# Patient Record
Sex: Female | Born: 1935 | Race: White | Hispanic: No | Marital: Married | Smoking: Never smoker
Health system: Southern US, Community
[De-identification: ages and names within clinical notes are randomized; demographics above are authoritative.]

## PROBLEM LIST (undated history)

## (undated) DIAGNOSIS — E119 Type 2 diabetes mellitus without complications: Secondary | ICD-10-CM

## (undated) DIAGNOSIS — E7800 Pure hypercholesterolemia, unspecified: Secondary | ICD-10-CM

## (undated) DIAGNOSIS — J4489 Other specified chronic obstructive pulmonary disease: Secondary | ICD-10-CM

## (undated) DIAGNOSIS — I10 Essential (primary) hypertension: Secondary | ICD-10-CM

## (undated) DIAGNOSIS — C801 Malignant (primary) neoplasm, unspecified: Secondary | ICD-10-CM

## (undated) DIAGNOSIS — M549 Dorsalgia, unspecified: Secondary | ICD-10-CM

## (undated) DIAGNOSIS — M1990 Unspecified osteoarthritis, unspecified site: Secondary | ICD-10-CM

## (undated) DIAGNOSIS — J209 Acute bronchitis, unspecified: Secondary | ICD-10-CM

## (undated) DIAGNOSIS — I2510 Atherosclerotic heart disease of native coronary artery without angina pectoris: Secondary | ICD-10-CM

## (undated) DIAGNOSIS — G8929 Other chronic pain: Secondary | ICD-10-CM

## (undated) DIAGNOSIS — J309 Allergic rhinitis, unspecified: Secondary | ICD-10-CM

## (undated) DIAGNOSIS — J449 Chronic obstructive pulmonary disease, unspecified: Secondary | ICD-10-CM

## (undated) DIAGNOSIS — G40909 Epilepsy, unspecified, not intractable, without status epilepticus: Secondary | ICD-10-CM

## (undated) DIAGNOSIS — I639 Cerebral infarction, unspecified: Secondary | ICD-10-CM

## (undated) HISTORY — PX: LUMBAR SPINE SURGERY: SHX701

## (undated) HISTORY — PX: TOTAL HIP ARTHROPLASTY: SHX124

## (undated) HISTORY — DX: Cerebral infarction, unspecified: I63.9

## (undated) HISTORY — DX: Unspecified osteoarthritis, unspecified site: M19.90

## (undated) HISTORY — PX: FEMUR FRACTURE SURGERY: SHX633

## (undated) HISTORY — DX: Essential (primary) hypertension: I10

## (undated) HISTORY — DX: Acute bronchitis, unspecified: J20.9

## (undated) HISTORY — DX: Other chronic pain: G89.29

## (undated) HISTORY — DX: Allergic rhinitis, unspecified: J30.9

## (undated) HISTORY — DX: Pure hypercholesterolemia, unspecified: E78.00

## (undated) HISTORY — PX: TOTAL KNEE ARTHROPLASTY: SHX125

## (undated) HISTORY — DX: Atherosclerotic heart disease of native coronary artery without angina pectoris: I25.10

## (undated) HISTORY — DX: Type 2 diabetes mellitus without complications: E11.9

## (undated) HISTORY — DX: Dorsalgia, unspecified: M54.9

## (undated) HISTORY — DX: Chronic obstructive pulmonary disease, unspecified: J44.9

## (undated) HISTORY — PX: BREAST BIOPSY: SHX20

## (undated) HISTORY — DX: Other specified chronic obstructive pulmonary disease: J44.89

## (undated) HISTORY — PX: TOTAL ABDOMINAL HYSTERECTOMY: SHX209

## (undated) HISTORY — DX: Epilepsy, unspecified, not intractable, without status epilepticus: G40.909

---

## 2001-08-05 HISTORY — PX: CORONARY STENT PLACEMENT: SHX1402

## 2003-05-28 ENCOUNTER — Other Ambulatory Visit: Payer: Self-pay

## 2003-12-12 ENCOUNTER — Ambulatory Visit: Payer: Self-pay | Admitting: Internal Medicine

## 2004-02-18 ENCOUNTER — Ambulatory Visit: Payer: Self-pay | Admitting: Internal Medicine

## 2004-07-24 ENCOUNTER — Ambulatory Visit: Payer: Self-pay | Admitting: Internal Medicine

## 2004-08-11 ENCOUNTER — Inpatient Hospital Stay: Payer: Self-pay | Admitting: Cardiology

## 2004-08-11 ENCOUNTER — Other Ambulatory Visit: Payer: Self-pay

## 2004-08-18 ENCOUNTER — Emergency Department: Payer: Self-pay | Admitting: Internal Medicine

## 2004-09-10 ENCOUNTER — Encounter: Admission: RE | Admit: 2004-09-10 | Discharge: 2004-09-10 | Payer: Self-pay | Admitting: Orthopedic Surgery

## 2004-10-07 ENCOUNTER — Ambulatory Visit: Payer: Self-pay | Admitting: Pulmonary Disease

## 2004-10-28 ENCOUNTER — Ambulatory Visit: Payer: Self-pay | Admitting: Internal Medicine

## 2005-01-13 ENCOUNTER — Ambulatory Visit: Payer: Self-pay | Admitting: Internal Medicine

## 2005-05-08 ENCOUNTER — Ambulatory Visit: Payer: Self-pay | Admitting: Internal Medicine

## 2005-05-20 ENCOUNTER — Ambulatory Visit (HOSPITAL_COMMUNITY): Admission: RE | Admit: 2005-05-20 | Discharge: 2005-05-20 | Payer: Self-pay | Admitting: Neurosurgery

## 2005-07-07 ENCOUNTER — Ambulatory Visit: Payer: Self-pay | Admitting: Internal Medicine

## 2005-07-21 ENCOUNTER — Inpatient Hospital Stay (HOSPITAL_COMMUNITY): Admission: RE | Admit: 2005-07-21 | Discharge: 2005-07-23 | Payer: Self-pay | Admitting: Neurosurgery

## 2005-08-13 ENCOUNTER — Ambulatory Visit: Payer: Self-pay | Admitting: Internal Medicine

## 2005-08-31 ENCOUNTER — Ambulatory Visit: Payer: Self-pay | Admitting: Ophthalmology

## 2005-09-08 ENCOUNTER — Ambulatory Visit: Payer: Self-pay | Admitting: Ophthalmology

## 2005-09-23 ENCOUNTER — Ambulatory Visit (HOSPITAL_COMMUNITY): Admission: RE | Admit: 2005-09-23 | Discharge: 2005-09-23 | Payer: Self-pay | Admitting: Neurosurgery

## 2005-11-04 ENCOUNTER — Ambulatory Visit: Payer: Self-pay | Admitting: Internal Medicine

## 2005-11-25 ENCOUNTER — Inpatient Hospital Stay (HOSPITAL_COMMUNITY): Admission: RE | Admit: 2005-11-25 | Discharge: 2005-11-29 | Payer: Self-pay | Admitting: Neurosurgery

## 2005-12-25 ENCOUNTER — Ambulatory Visit: Payer: Self-pay | Admitting: Internal Medicine

## 2006-05-04 ENCOUNTER — Ambulatory Visit: Payer: Self-pay | Admitting: Internal Medicine

## 2006-07-19 ENCOUNTER — Ambulatory Visit: Payer: Self-pay | Admitting: Pulmonary Disease

## 2006-09-15 ENCOUNTER — Ambulatory Visit: Payer: Self-pay | Admitting: Internal Medicine

## 2006-11-04 ENCOUNTER — Ambulatory Visit: Payer: Self-pay | Admitting: Internal Medicine

## 2006-11-09 ENCOUNTER — Ambulatory Visit: Payer: Self-pay | Admitting: Internal Medicine

## 2007-03-31 ENCOUNTER — Ambulatory Visit: Payer: Self-pay | Admitting: Neurosurgery

## 2007-04-19 ENCOUNTER — Ambulatory Visit: Payer: Self-pay | Admitting: Internal Medicine

## 2007-04-19 DIAGNOSIS — Z8673 Personal history of transient ischemic attack (TIA), and cerebral infarction without residual deficits: Secondary | ICD-10-CM

## 2007-04-19 DIAGNOSIS — J3089 Other allergic rhinitis: Secondary | ICD-10-CM

## 2007-04-19 DIAGNOSIS — J209 Acute bronchitis, unspecified: Secondary | ICD-10-CM

## 2007-04-19 DIAGNOSIS — J302 Other seasonal allergic rhinitis: Secondary | ICD-10-CM

## 2007-04-19 DIAGNOSIS — I2510 Atherosclerotic heart disease of native coronary artery without angina pectoris: Secondary | ICD-10-CM | POA: Insufficient documentation

## 2007-04-28 ENCOUNTER — Telehealth: Payer: Self-pay | Admitting: Internal Medicine

## 2007-05-04 ENCOUNTER — Ambulatory Visit: Payer: Self-pay | Admitting: Internal Medicine

## 2007-05-06 ENCOUNTER — Telehealth (INDEPENDENT_AMBULATORY_CARE_PROVIDER_SITE_OTHER): Payer: Self-pay | Admitting: *Deleted

## 2007-05-10 ENCOUNTER — Ambulatory Visit: Payer: Self-pay | Admitting: Internal Medicine

## 2007-05-14 ENCOUNTER — Encounter: Payer: Self-pay | Admitting: Internal Medicine

## 2007-05-23 ENCOUNTER — Ambulatory Visit: Payer: Self-pay | Admitting: Internal Medicine

## 2007-05-24 ENCOUNTER — Ambulatory Visit: Payer: Self-pay | Admitting: General Practice

## 2007-05-26 ENCOUNTER — Telehealth (INDEPENDENT_AMBULATORY_CARE_PROVIDER_SITE_OTHER): Payer: Self-pay | Admitting: *Deleted

## 2007-06-08 ENCOUNTER — Inpatient Hospital Stay: Payer: Self-pay | Admitting: General Practice

## 2007-07-26 ENCOUNTER — Telehealth (INDEPENDENT_AMBULATORY_CARE_PROVIDER_SITE_OTHER): Payer: Self-pay | Admitting: *Deleted

## 2007-09-13 ENCOUNTER — Ambulatory Visit: Payer: Self-pay | Admitting: Internal Medicine

## 2007-09-14 ENCOUNTER — Telehealth: Payer: Self-pay | Admitting: Internal Medicine

## 2007-09-16 ENCOUNTER — Ambulatory Visit: Payer: Self-pay | Admitting: Internal Medicine

## 2007-09-27 ENCOUNTER — Ambulatory Visit: Payer: Self-pay | Admitting: Internal Medicine

## 2007-10-28 ENCOUNTER — Telehealth (INDEPENDENT_AMBULATORY_CARE_PROVIDER_SITE_OTHER): Payer: Self-pay | Admitting: *Deleted

## 2008-01-19 ENCOUNTER — Ambulatory Visit: Payer: Self-pay | Admitting: Internal Medicine

## 2008-01-23 ENCOUNTER — Ambulatory Visit: Payer: Self-pay | Admitting: Internal Medicine

## 2008-01-30 ENCOUNTER — Telehealth (INDEPENDENT_AMBULATORY_CARE_PROVIDER_SITE_OTHER): Payer: Self-pay | Admitting: *Deleted

## 2008-05-02 ENCOUNTER — Ambulatory Visit: Payer: Self-pay | Admitting: Internal Medicine

## 2008-06-29 ENCOUNTER — Ambulatory Visit: Payer: Self-pay | Admitting: Internal Medicine

## 2008-06-29 ENCOUNTER — Encounter (INDEPENDENT_AMBULATORY_CARE_PROVIDER_SITE_OTHER): Payer: Self-pay | Admitting: *Deleted

## 2008-07-05 ENCOUNTER — Telehealth: Payer: Self-pay | Admitting: Internal Medicine

## 2008-08-13 ENCOUNTER — Ambulatory Visit: Payer: Self-pay | Admitting: Internal Medicine

## 2009-01-21 ENCOUNTER — Ambulatory Visit: Payer: Self-pay | Admitting: Internal Medicine

## 2009-02-04 ENCOUNTER — Ambulatory Visit: Payer: Self-pay | Admitting: Internal Medicine

## 2009-02-07 ENCOUNTER — Ambulatory Visit: Payer: Self-pay | Admitting: Internal Medicine

## 2009-02-11 ENCOUNTER — Telehealth: Payer: Self-pay | Admitting: Adult Health

## 2009-02-19 ENCOUNTER — Ambulatory Visit: Payer: Self-pay | Admitting: Ophthalmology

## 2009-02-25 ENCOUNTER — Ambulatory Visit: Payer: Self-pay | Admitting: Ophthalmology

## 2009-04-08 ENCOUNTER — Ambulatory Visit: Payer: Self-pay | Admitting: Internal Medicine

## 2009-04-08 DIAGNOSIS — J4540 Moderate persistent asthma, uncomplicated: Secondary | ICD-10-CM

## 2009-04-23 ENCOUNTER — Ambulatory Visit: Payer: Self-pay | Admitting: Internal Medicine

## 2009-05-03 ENCOUNTER — Telehealth (INDEPENDENT_AMBULATORY_CARE_PROVIDER_SITE_OTHER): Payer: Self-pay | Admitting: *Deleted

## 2009-06-20 ENCOUNTER — Ambulatory Visit: Payer: Self-pay | Admitting: Internal Medicine

## 2009-12-02 ENCOUNTER — Ambulatory Visit: Payer: Self-pay | Admitting: Internal Medicine

## 2009-12-12 ENCOUNTER — Telehealth: Payer: Self-pay | Admitting: Internal Medicine

## 2010-01-08 ENCOUNTER — Ambulatory Visit
Admission: RE | Admit: 2010-01-08 | Discharge: 2010-01-08 | Payer: Self-pay | Source: Home / Self Care | Attending: Internal Medicine | Admitting: Internal Medicine

## 2010-01-15 ENCOUNTER — Telehealth: Payer: Self-pay | Admitting: Internal Medicine

## 2010-01-22 ENCOUNTER — Ambulatory Visit: Payer: Self-pay | Admitting: Internal Medicine

## 2010-01-26 ENCOUNTER — Encounter: Payer: Self-pay | Admitting: Orthopedic Surgery

## 2010-02-04 NOTE — Progress Notes (Signed)
Summary: medication  Phone Note Call from Patient   Caller: Patient Call For: Brooke Baker Summary of Call: pt still wheezing and chest congestion would like another round of antibiotic  [facility] Initial call taken by: Rickard Patience,  February 11, 2009 9:04 AM  Follow-up for Phone Call        The patient states she is feeling much better but she is still wheezing and feels congested. She just finished the Augmentin and Prednisone but feels she needs a few more days on the Augmentin. Please advise.Michel Bickers [facility]  February 11, 2009 12:17 PM  NKDA  Additional Follow-up for Phone Call Additional follow up Details #1::        Please contact office for sooner follow up if symptoms do not improve or worsen  Augmentin 875mg  two times a day for 7 more day #14, no refills.  follow up as scheduled.  Additional Follow-up by: Rubye Oaks NP,  February 11, 2009 1:09 PM    Additional Follow-up for Phone Call Additional follow up Details #2::    rx sent. pt aware. Carron Curie CMA  February 11, 2009 2:17 PM   Prescriptions: AUGMENTIN 875-125 MG TABS (AMOXICILLIN-POT CLAVULANATE) 1 by mouth two times a day  #14 x 0   Entered by:   Carron Curie CMA   Authorized by:   Rubye Oaks NP   Signed by:   Carron Curie CMA on 02/11/2009   Method used:   Electronically to        [facility] (retail)       [address]       [place], [place]  [id number]       Ph: [phone]       Fax: [phone]   RxID:   [id number]

## 2010-02-04 NOTE — Assessment & Plan Note (Signed)
Summary: Brooke Baker   Primary Provider/Referring Provider:  Dale Mullen  CC:  follow up visit-breathing doing good; no complaints..  History of Present Illness:   01/23/08-  Allergic rhinitis, asthma Caught cold a week ago and now progressive asthma with nasal congestion, green from nose and chest. Denies fever, sore throat and GI upset. Took Mucinex. Caught from husband and son. Seasonal flu vax.  June 29, 2008--Presents for an acute office visit. Pt c/o sneezing, wheezing, and sob since yesterday.  PT states she has a productive cough at times with yellow to white mucus for 3 days. Denies chest pain, dyspnea, orthopnea, hemoptysis, fever, n/v/d, edema, headache.   February 04, 2009--Presents for an acute office visit. Complains of prod cough with yellow/green mucus x1week with and onset of dyspnea and wheezing last night. OTC not helping. Wheezing bad last night. Denies chest pain,  orthopnea, hemoptysis, fever, n/v/d, edema, headache.   April 08, 2009- Allergic rhinitis, asthma.....................................Marland Kitchenhusband here She is feeling well today, but needed over 2 weeks to get over a sustained bronchitis during the winter. She notes little routine wheeze or cough but occasionally needs her rescue inhaler. Continues Symbicort 80 and her allergy vaccine without problems.   Current Medications (verified): 1)  Adult Aspirin Low Strength 81 Mg  Tbdp (Aspirin) .... Once Daily 2)  Plavix 75 Mg  Tabs (Clopidogrel Bisulfate) .... Once Daily 3)  Cozaar 100 Mg  Tabs (Losartan Potassium) .... Once Daily 4)  Toprol Xl 50 Mg  Tb24 (Metoprolol Succinate) .... Once Daily 5)  Crestor 20 Mg  Tabs (Rosuvastatin Calcium) .... Once Daily 6)  Keppra 750 Mg  Tabs (Levetiracetam) .... Two Times A Day 7)  Dilantin 100 Mg  Caps (Phenytoin Sodium Extended) .... 4tabs At Bedtime 8)  Zoloft 50 Mg  Tabs (Sertraline Hcl) .... Once Daily 9)  Allergy Vaccine 1:10 Go (W-E) .... Once Weekly 10)  Epipen 0.3  Mg/0.40ml (1:1000)  Devi (Epinephrine Hcl (Anaphylaxis)) .... As Needed 11)  Amlodipine Besylate 10 Mg  Tabs (Amlodipine Besylate) .... Once Daily 12)  Hydrochlorothiazide 12.5 Mg  Tabs (Hydrochlorothiazide) .... Once Daily 13)  Ventolin Hfa 108 (90 Base) Mcg/act  Aers (Albuterol Sulfate) .... Inhale 2 Puffs Every 4 Hours As Needed 14)  Symbicort 80-4.5 Mcg/act Aero (Budesonide-Formoterol Fumarate) .... 2 Puffs and Rinse  Allergies (verified): No Known Drug Allergies  Past History:  Past Medical History: Last updated: 09/27/2007 ALLERGIC RHINITIS (ICD-477.9) ASTHMATIC BRONCHITIS, ACUTE (ICD-466.0) CEREBROVASCULAR ACCIDENT (ICD-434.91) CORONARY ARTERY DISEASE (ICD-414.00)  Past Surgical History: Last updated: 01/23/2008 Lumbar spi ne x 3 hysterectomy right hip replacement right knee replacement.  Family History: Last updated: 02/04/2009 allergies - brother, sister asthma - brother, sister rheumatism - sister, brother cancer - sister (Non-Hodgkins Lymphoma) DM - brother stroke -  mother  Social History: Last updated: 02/04/2009 Patient never smoked.  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    Vital Signs:  Patient profile:   76 year old female Height:      66 inches Weight:      253 pounds BMI:     40.98 O2 Sat:      97 % on Room air Pulse rate:   64 / minute BP sitting:  134 / 60  (left arm) Cuff size:   large  Vitals Entered By: Reynaldo Minium CMA (April 08, 2009 3:03 PM)  O2 Flow:  Room air  Physical Exam  Additional Exam:  General: A/Ox3; pleasant and cooperative, NAD, overweight SKIN: no rash, lesions NODES: no  lymphadenopathy HEENT: [place]/AT, EOM- WNL, Conjuctivae- clear,  TM-WNL, Nose- Marked nasal congestion, Throat- clear and wnl, mild hoarseness, dentures NECK: Supple w/ fair ROM, JVD- none, normal carotid impulses w/o bruits Thyroid- normal to palpation CHEST: no  wheezing heardtoday HEART: RRR, no m/g/r heard ABDOMEN: Soft and nl;  DDU:KGUR, nl pulses, no edema  NEURO: Grossly intact to observation      Impression & Recommendations:  Problem # 1:  ALLERGIC RHINITIS (ICD-477.9)  We discussed and renewed her Epipen. She can continue vacine.  Problem # 2:  ASTHMA (ICD-493.90) Mild chronic intermittent asthma. She uses meds appropriately.  Other Orders: Est. Patient Level III (42706)  Patient Instructions: 1)  Please schedule a follow-up appointment in 6 months. 2)  Refill script for epipen 3)  Continuing allergy vaccine 4)  Call sooner as needed Prescriptions: EPIPEN 0.3 MG/0.3ML (1:1000)  DEVI (EPINEPHRINE HCL (ANAPHYLAXIS)) as needed  #1 x 0   Entered and Authorized by:   Waymon Budge MD   Signed by:   Waymon Budge MD on 04/08/2009   Method used:   Print then Give to Patient   RxID:   [id number]

## 2010-02-04 NOTE — Assessment & Plan Note (Signed)
Summary: Acute NP office visit - asthma   Primary Provider/Referring Provider:  Dale Goshen  CC:  prod cough with yellow/green mucus x1week with and onset of dyspnea and wheezing last night.  History of Present Illness: Current Problems:  ALLERGIC RHINITIS (ICD-477.9) ASTHMATIC BRONCHITIS, ACUTE (ICD-466.0) CEREBROVASCULAR ACCIDENT (ICD-434.91) CORONARY ARTERY DISEASE (ICD-414.00)   01/23/08-  Allergic rhinitis, asthma Caught cold a week ago and now progressive asthma with nasal congestion, green from nose and chest. Denies fever, sore throat and GI upset. Took Mucinex. Caught from husband and son. Seasonal flu vax.  June 29, 2008--Presents for an acute office visit. Pt c/o sneezing, wheezing, and sob since yesterday.  PT states she has a productive cough at times with yellow to white mucus for 3 days. Denies chest pain, dyspnea, orthopnea, hemoptysis, fever, n/v/d, edema, headache.   February 04, 2009--Presents for an acute office visit. Complains of prod cough with yellow/green mucus x1week with and onset of dyspnea and wheezing last night. OTC not helping. Wheezing bad last night. Denies chest pain,  orthopnea, hemoptysis, fever, n/v/d, edema, headache.     Medications Prior to Update: 1)  Adult Aspirin Low Strength 81 Mg  Tbdp (Aspirin) .... Once Daily 2)  Plavix 75 Mg  Tabs (Clopidogrel Bisulfate) .... Once Daily 3)  Cozaar 100 Mg  Tabs (Losartan Potassium) .... Once Daily 4)  Toprol Xl 50 Mg  Tb24 (Metoprolol Succinate) .... Once Daily 5)  Crestor 20 Mg  Tabs (Rosuvastatin Calcium) .... Once Daily 6)  Keppra 750 Mg  Tabs (Levetiracetam) .... Two Times A Day 7)  Dilantin 100 Mg  Caps (Phenytoin Sodium Extended) .... 4tabs At Bedtime 8)  Zoloft 50 Mg  Tabs (Sertraline Hcl) .... Once Daily 9)  Allergy Vaccine 1:10 Go (W-E) .... Once Weekly 10)  Epipen 0.3 Mg/0.74ml (1:1000)  Devi (Epinephrine Hcl (Anaphylaxis)) .... As Needed 11)  Amlodipine Besylate 10 Mg  Tabs (Amlodipine  Besylate) .... Once Daily 12)  Hydrochlorothiazide 12.5 Mg  Tabs (Hydrochlorothiazide) .... Once Daily 13)  Ventolin Hfa 108 (90 Base) Mcg/act  Aers (Albuterol Sulfate) .... Inhale 2 Puffs Every 4 Hours As Needed 14)  Symbicort 80-4.5 Mcg/act Aero (Budesonide-Formoterol Fumarate) .... 2 Puffs and Rinse 15)  Prednisone 10 Mg Tabs (Prednisone) .... 4 Tabs For 2 Days, Then 3 Tabs For 2 Days, 2 Tabs For 2 Days, Then 1 Tab For 2 Days, Then Stop 16)  Zithromax Z-Pak 250 Mg Tabs (Azithromycin) .... Take As Directed.  Current Medications (verified): 1)  Adult Aspirin Low Strength 81 Mg  Tbdp (Aspirin) .... Once Daily 2)  Plavix 75 Mg  Tabs (Clopidogrel Bisulfate) .... Once Daily 3)  Cozaar 100 Mg  Tabs (Losartan Potassium) .... Once Daily 4)  Toprol Xl 50 Mg  Tb24 (Metoprolol Succinate) .... Once Daily 5)  Crestor 20 Mg  Tabs (Rosuvastatin Calcium) .... Once Daily 6)  Keppra 750 Mg  Tabs (Levetiracetam) .... Two Times A Day 7)  Dilantin 100 Mg  Caps (Phenytoin Sodium Extended) .... 4tabs At Bedtime 8)  Zoloft 50 Mg  Tabs (Sertraline Hcl) .... Once Daily 9)  Allergy Vaccine 1:10 Go (W-E) .... Once Weekly 10)  Epipen 0.3 Mg/0.33ml (1:1000)  Devi (Epinephrine Hcl (Anaphylaxis)) .... As Needed 11)  Amlodipine Besylate 10 Mg  Tabs (Amlodipine Besylate) .... Once Daily 12)  Hydrochlorothiazide 12.5 Mg  Tabs (Hydrochlorothiazide) .... Once Daily 13)  Ventolin Hfa 108 (90 Base) Mcg/act  Aers (Albuterol Sulfate) .... Inhale 2 Puffs Every 4 Hours As Needed 14)  Symbicort  80-4.5 Mcg/act Aero (Budesonide-Formoterol Fumarate) .... 2 Puffs and Rinse  Allergies (verified): No Known Drug Allergies  Past History:  Past Medical History: Last updated: 09/27/2007 ALLERGIC RHINITIS (ICD-477.9) ASTHMATIC BRONCHITIS, ACUTE (ICD-466.0) CEREBROVASCULAR ACCIDENT (ICD-434.91) CORONARY ARTERY DISEASE (ICD-414.00)  Past Surgical History: Last updated: 01/23/2008 Lumbar spi ne x 3 hysterectomy right hip  replacement right knee replacement.  Family History: Last updated: 02/04/2009 allergies - brother, sister asthma - brother, sister rheumatism - sister, brother cancer - sister (Non-Hodgkins Lymphoma) DM - brother stroke -  mother  Social History: Last updated: 02/04/2009 Patient never smoked.  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 No Comments Medications reviewed with patient Daytime contact number verified with patient. Boone Master CNA  February 04, 2009 10:42 AM    Physical Exam  Additional Exam:  General: A/Ox3; pleasant and cooperative, NAD, SKIN: no rash, lesions NODES: no lymphadenopathy HEENT: [place]/AT, EOM- WNL, Conjuctivae- clear,  TM-WNL, Nose- Marked nasal congestion, Throat- clear and wnl, mild hoarseness, dentures NECK: Supple w/ fair ROM, JVD- none, normal carotid impulses w/o bruits Thyroid- normal to palpation CHEST: exp wheezing HEART: RRR, no m/g/r heard ABDOMEN: Soft and nl; nml bowel sounds; no organomegaly or masses noted UJW:JXBJ, nl pulses, no edema  NEURO: Grossly intact to observation      Impression &  Recommendations:  Problem # 1:  ASTHMATIC BRONCHITIS, ACUTE (ICD-466.0) Flare  REC: xopenex neb in office  Augmentin 875mg  two times a day for 7 days with food Mucinex DM two times a day as needed cough /congesiton  Prednsione taper over next week.  Increase fluids.  Please contact office for sooner follow up if symptoms do not improve or worsen  follow up Dr. Maple Hudson as scheduled.  The following medications were removed from the medication list:    Zithromax Z-pak 250 Mg Tabs (Azithromycin) .[name]... Take as directed. Her updated medication list for this problem includes:    Ventolin Hfa 108 (90 Base) Mcg/act Aers (Albuterol sulfate) ..... Inhale 2 puffs every 4 hours as needed    Symbicort 80-4.5 Mcg/act Aero (Budesonide-formoterol fumarate) .[name]... 2 puffs and rinse    Augmentin 875-125 Mg Tabs (Amoxicillin-pot clavulanate) .[name]... 1 by mouth two times a day  Orders: Est. Patient Level IV (47829)  Medications Added to Medication List This Visit: 1)  Augmentin 875-125 Mg Tabs (Amoxicillin-pot clavulanate) .[name].. 1 by mouth two times a day 2)  Prednisone 10 Mg Tabs (Prednisone) .... 4 tabs for 2 days, then 3 tabs  for 2 days, 2 tabs for 2 days, then 1 tab for 2 days, then stop 3)  Fluconazole 150 Mg Tabs (Fluconazole) .[name].. 1 by mouth once daily x 1 dose for yeast infection  Complete Medication List: 1)  Adult Aspirin Low Strength 81 Mg Tbdp (Aspirin) .... Once daily 2)  Plavix 75 Mg Tabs (Clopidogrel bisulfate) .... Once daily 3)  Cozaar 100 Mg Tabs (Losartan potassium) .... Once daily 4)  Toprol Xl 50 Mg Tb24 (Metoprolol succinate) .... Once daily 5)  Crestor 20 Mg Tabs (Rosuvastatin calcium) .... Once daily 6)  Keppra 750 Mg Tabs (Levetiracetam) .... Two times a day 7)  Dilantin 100 Mg Caps (Phenytoin sodium extended) .... 4tabs at bedtime 8)  Zoloft 50 Mg Tabs (Sertraline hcl) .... Once daily 9)  Allergy Vaccine 1:10 Go (w-e)  .... Once weekly 10)  Epipen 0.3 Mg/0.24ml (1:1000) Devi  (Epinephrine hcl (anaphylaxis)) .... As needed 11)  Amlodipine Besylate 10 Mg Tabs (Amlodipine besylate) .... Once daily 12)  Hydrochlorothiazide 12.5 Mg Tabs (Hydrochlorothiazide) .... Once daily 13)  Ventolin Hfa 108 (90 Base) Mcg/act Aers (Albuterol sulfate) .... Inhale 2 puffs every 4 hours as needed 14)  Symbicort 80-4.5 Mcg/act Aero (Budesonide-formoterol fumarate) .... 2 puffs and rinse 15)  Augmentin 875-125 Mg Tabs (Amoxicillin-pot clavulanate) .[name].. 1 by mouth two times a day 16)  Prednisone 10 Mg Tabs (Prednisone) .... 4 tabs for 2 days, then 3 tabs for 2 days, 2 tabs for 2 days, then 1 tab for 2 days, then stop 17)  Fluconazole 150 Mg Tabs (Fluconazole) .[name].. 1 by mouth once daily x 1 dose for yeast infection  Patient Instructions: 1)  Augmentin 875mg  two times a day for 7 days with food 2)  Mucinex DM two times a day as needed cough /congesiton  3)  Prednsione taper over next week.  4)  Increase fluids.  5)  Please contact office for sooner follow up if symptoms do not improve or worsen  6)  follow up Dr. Maple Hudson as scheduled.  Prescriptions: FLUCONAZOLE 150 MG TABS (FLUCONAZOLE) 1 by mouth once daily x 1 dose for yeast infection  #1 x 0   Entered and Authorized by:   Rubye Oaks NP   Signed by:   Rubye Oaks NP on 02/04/2009   Method used:   Electronically to        [facility] (retail)       [address]       [place], [place]  [id number]       Ph: [phone]       Fax: [phone]   RxID:   [id number] PREDNISONE 10 MG TABS (PREDNISONE) 4 tabs for 2 days, then 3 tabs for 2 days, 2 tabs for 2 days, then 1 tab for 2 days, then stop  #20 x 0   Entered and Authorized by:   Rubye Oaks NP   Signed by:   Rubye Oaks NP on 02/04/2009   Method used:   Electronically to        [facility] (retail)       [address]       [place], [place]  [id number]       Ph: [phone]       Fax:  [phone]   RxID:   [id number] AUGMENTIN 875-125 MG TABS (AMOXICILLIN-POT CLAVULANATE) 1 by mouth two times a day  #14  x 0   Entered and Authorized by:   Rubye Oaks NP   Signed by:   Rubye Oaks NP on 02/04/2009   Method used:   Electronically to        [facility] (retail)       [address]       [place], [place]  [address]       Ph: [phone]       Fax: [phone]   RxID:   [id number]    Orders Added: 1)  Est. Patient Level IV [24401]    Immunization History:  Influenza Immunization History:    Influenza:  historical (10/05/2008)  Pneumovax Immunization History:    Pneumovax:  historical (01/05/2005)   Appended Document: neb tx documentation    Clinical Lists Changes  Orders: Added new Service order of Nebulizer Tx (02725) - Signed

## 2010-02-04 NOTE — Progress Notes (Signed)
Summary: Sick requesting abx and Pred taper  Phone Note Call from Patient Call back at Home Phone [phone]   Caller: Patient Call For: Gabriel Conry Reason for Call: Talk to Nurse, Talk to Doctor Summary of Call: Pt c/o fatigue, nausea, vomiting, wheezing, productive cough with yellow mucus, bloody nasal drainage, chills, bodyaches, unsteady on feet,  increased SOB all the time, head congestion, T-103 Sunday night. Pt checked temp while on the phone with me and same was T-99.1, has been taking Mucinex DM 1 tab by mouth every 4 hours, Robitussin DM as per bottle. Pt requesting zpak ("strongest you got") and Prednisone Taper ("will dance at his next wedding") [facility] NKDA Initial call taken by: Jessica Robinson CMA,  December 12, 2009 9:34 AM  Follow-up for Phone Call        Per CDY-give patient Zpak #1 take as directed no refills and Prednisone 10mg #20 take 4 by mouth x 2 days, 3 by mouth x 2 days, 2 x 2 days, 1 x 2 days then stop. no refills.Katie Welchel CMA  December 12, 2009 12:14 PM   rx sent. pt aware of recs.Jennifer Castillo CMA  December 12, 2009 12:41 PM     New/Updated Medications: ZITHROMAX Z-PAK 250 MG TABS (AZITHROMYCIN) as directed Prescriptions: ZITHROMAX Z-PAK 250 MG TABS (AZITHROMYCIN) as directed  #1 pak x 0   Entered by:   Jennifer Castillo CMA   Authorized by:   Verba Ainley D Ceira Hoeschen MD   Signed by:   Jennifer Castillo CMA on 12/12/2009   Method used:   Electronically to        [facility] (retail)       [address]       [place]       [place], [place]  [address]       Ph: [phone]       Fax: [phone]   RxID:   [id number] PREDNISONE 10 MG TABS (PREDNISONE) 4 tabs for 2 days, then 3 tabs for 2 days, 2 tabs for 2 days, then 1 tab for 2 days, then stop  #20 x 0   Entered by:   Jennifer Castillo CMA   Authorized by:   Artis Buechele D Diontre Harps MD   Signed by:   Jennifer Castillo CMA on 12/12/2009   Method used:    Electronically to        [facility] (retail)       [address]       [place], [place]  [address]       Ph: [phone]       Fax: [phone]   RxID:   [id number]

## 2010-02-04 NOTE — Progress Notes (Signed)
Summary: congestion wheezing  Phone Note Call from Patient   Caller: Patient Call For: parrett Summary of Call: chest congestion wheezing would like another round of prednisone [facility]  Initial call taken by: Rickard Patience,  May 03, 2009 11:43 AM  Follow-up for Phone Call        Called and spoke with pt.  She was seen by TP for acute visit on 4/19 and txd with prednisone taper and zpack.  She states that sputum turned back to white, but still doing quite a bit of coughing and has chest congestion and wheezing.  She is requsting another prednisone taper be called in.  She finished last 1 2-3 days ago.  Please advise, thanks! NKDA Follow-up by: Vernie Murders,  May 03, 2009 11:51 AM  Additional Follow-up for Phone Call Additional follow up Details #1::        OK  prednisone 10 mg, # 20 1 tab four times daily x 2 days, 3 times daily x 2 days, 2 times daily x 2 days, 1 time daily x 2 days   Additional Follow-up by: Waymon Budge MD,  May 03, 2009 1:37 PM    Additional Follow-up for Phone Call Additional follow up Details #2::    Rx for prednisone was sent to pharm.  Spoke with pt and made aware this was done. Follow-up by: Vernie Murders,  May 03, 2009 1:48 PM  New/Updated Medications: PREDNISONE 10 MG TABS (PREDNISONE) 1 four times a day x 2 days, 1 three times a day x 2 days, 1 two times a day x 2 days, 1 once daily x 2 days, then stop Prescriptions: PREDNISONE 10 MG TABS (PREDNISONE) 1 four times a day x 2 days, 1 three times a day x 2 days, 1 two times a day x 2 days, 1 once daily x 2 days, then stop  #20 x 0   Entered by:   Vernie Murders   Authorized by:   Waymon Budge MD   Signed by:   Vernie Murders on 05/03/2009   Method used:   Electronically to        [facility] (retail)       [address]       [place], [place]  [id number]       Ph: [phone]       Fax: [phone]   RxID:   [id number]

## 2010-02-04 NOTE — Assessment & Plan Note (Signed)
Summary: Acute NP office visit - asthma   Primary Provider/Referring Provider:  Dale Williams  CC:  wheezing, SOB, and prod cough with thick light yellow mucus x2weeks - denies f/c/s.  History of Present Illness:   01/23/08-  Allergic rhinitis, asthma Caught cold a week ago and now progressive asthma with nasal congestion, green from nose and chest. Denies fever, sore throat and GI upset. Took Mucinex. Caught from husband and son. Seasonal flu vax.  June 29, 2008--Presents for an acute office visit. Pt c/o sneezing, wheezing, and sob since yesterday.  PT states she has a productive cough at times with yellow to white mucus for 3 days. Denies chest pain, dyspnea, orthopnea, hemoptysis, fever, n/v/d, edema, headache.   February 04, 2009--Presents for an acute office visit. Complains of prod cough with yellow/green mucus x1week with and onset of dyspnea and wheezing last night. OTC not helping. Wheezing bad last night. Denies chest pain,  orthopnea, hemoptysis, fever, n/v/d, edema, headache.   April 08, 2009- Allergic rhinitis, asthma.....................................Marland Kitchenhusband here She is feeling well today, but needed over 2 weeks to get over a sustained bronchitis during the winter. She notes little routine wheeze or cough but occasionally needs her rescue inhaler. Continues Symbicort 80 and her allergy vaccine without problems.  April 23, 2009--Presents for an acute office visit. Complains of wheezing, SOB, prod cough with thick light yellow mucus x2weeks. OTC not helping. Denies chest pain, dyspnea, orthopnea, hemoptysis, fever, n/v/d, edema, headache,recent travel or antibiotics.   Medications Prior to Update: 1)  Adult Aspirin Low Strength 81 Mg  Tbdp (Aspirin) .... Once Daily 2)  Plavix 75 Mg  Tabs (Clopidogrel Bisulfate) .... Once Daily 3)  Cozaar 100 Mg  Tabs (Losartan Potassium) .... Once Daily 4)  Toprol Xl 50 Mg  Tb24 (Metoprolol Succinate) .... Once Daily 5)  Crestor 20 Mg   Tabs (Rosuvastatin Calcium) .... Once Daily 6)  Keppra 750 Mg  Tabs (Levetiracetam) .... Two Times A Day 7)  Dilantin 100 Mg  Caps (Phenytoin Sodium Extended) .... 4tabs At Bedtime 8)  Zoloft 50 Mg  Tabs (Sertraline Hcl) .... Once Daily 9)  Allergy Vaccine 1:10 Go (W-E) .... Once Weekly 10)  Epipen 0.3 Mg/0.69ml (1:1000)  Devi (Epinephrine Hcl (Anaphylaxis)) .... As Needed 11)  Amlodipine Besylate 10 Mg  Tabs (Amlodipine Besylate) .... Once Daily 12)  Hydrochlorothiazide 12.5 Mg  Tabs (Hydrochlorothiazide) .... Once Daily 13)  Ventolin Hfa 108 (90 Base) Mcg/act  Aers (Albuterol Sulfate) .... Inhale 2 Puffs Every 4 Hours As Needed 14)  Symbicort 80-4.5 Mcg/act Aero (Budesonide-Formoterol Fumarate) .... 2 Puffs and Rinse  Current Medications (verified): 1)  Adult Aspirin Low Strength 81 Mg  Tbdp (Aspirin) .... Once Daily 2)  Plavix 75 Mg  Tabs (Clopidogrel Bisulfate) .... Once Daily 3)  Cozaar 100 Mg  Tabs (Losartan Potassium) .... Once Daily 4)  Toprol Xl 50 Mg  Tb24 (Metoprolol Succinate) .... Once Daily 5)  Crestor 20 Mg  Tabs (Rosuvastatin Calcium) .... Once Daily 6)  Keppra 750 Mg  Tabs (Levetiracetam) .... Two Times A Day 7)  Dilantin 100 Mg  Caps (Phenytoin Sodium Extended) .... 4tabs At Bedtime 8)  Zoloft 50 Mg  Tabs (Sertraline Hcl) .... Once Daily 9)  Allergy Vaccine 1:10 Go (W-E) .... Once Weekly 10)  Epipen 0.3 Mg/0.21ml (1:1000)  Devi (Epinephrine Hcl (Anaphylaxis)) .... As Needed 11)  Amlodipine Besylate 10 Mg  Tabs (Amlodipine Besylate) .... Once Daily 12)  Hydrochlorothiazide 12.5 Mg  Tabs (Hydrochlorothiazide) .... Once Daily  13)  Ventolin Hfa 108 (90 Base) Mcg/act  Aers (Albuterol Sulfate) .... Inhale 2 Puffs Every 4 Hours As Needed 14)  Symbicort 80-4.5 Mcg/act Aero (Budesonide-Formoterol Fumarate) .... 2 Puffs and Rinse 15)  Mucinex Dm 30-600 Mg Xr12h-Tab (Dextromethorphan-Guaifenesin) .... Take 1-2 Tablets Every 12 Hours As Needed  Allergies (verified): No Known Drug  Allergies  Past History:  Past Medical History: Last updated: 09/27/2007 ALLERGIC RHINITIS (ICD-477.9) ASTHMATIC BRONCHITIS, ACUTE (ICD-466.0) CEREBROVASCULAR ACCIDENT (ICD-434.91) CORONARY ARTERY DISEASE (ICD-414.00)  Past Surgical History: Last updated: 01/23/2008 Lumbar spi ne x 3 hysterectomy right hip replacement right knee replacement.  Family History: Last updated: 02/04/2009 allergies - brother, sister asthma - brother, sister rheumatism - sister, brother cancer - sister (Non-Hodgkins Lymphoma) DM - brother stroke -  mother  Social History: Last updated: 02/04/2009 Patient never smoked.  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 No Comments Medications reviewed with patient Daytime contact number verified with patient. Boone Master CNA  April 23, 2009 2:03 PM    Physical Exam  Additional Exam:  General: A/Ox3; pleasant and cooperative, NAD, overweight SKIN: no rash, lesions NODES: no lymphadenopathy HEENT: [place]/AT, EOM- WNL, Conjuctivae- clear,  TM-WNL, Nose- pale w/ clear discharge, Throat- clear and wnl, mild hoarseness, dentures NECK: Supple w/ fair ROM, JVD- none, normal carotid impulses w/o bruits Thyroid- normal to palpation CHEST: coarse BS w/ faint exp wheezing HEART: RRR, no m/g/r heard ABDOMEN: Soft and nl;  ZOX:WRUE, nl pulses, no edema  NEURO: Grossly intact to observation      Impression & Recommendations:  Problem # 1:   ASTHMATIC BRONCHITIS, ACUTE (ICD-466.0)  Flare  REC:  Zpack take as directed.  Prednisone taper over next week  Increase fluids.  Mucinex DM two times a day as needed cough/congestion Please contact office for sooner follow up if symptoms do not improve or worsen  Her updated medication list for this problem includes:    Ventolin Hfa 108 (90 Base) Mcg/act Aers (Albuterol sulfate) ..... Inhale 2 puffs every 4 hours as needed    Symbicort 80-4.5 Mcg/act Aero (Budesonide-formoterol fumarate) .[name]... 2 puffs and rinse    Mucinex Dm 30-600 Mg Xr12h-tab (Dextromethorphan-guaifenesin) .[name]... Take 1-2 tablets every 12 hours as needed    Zithromax Z-pak 250 Mg Tabs (Azithromycin) .[name]... Take as directed.  Orders: Est. Patient Level IV (45409) Prescription Created Electronically [phone])  Medications Added to Medication List This Visit: 1)  Mucinex Dm 30-600 Mg Xr12h-tab (Dextromethorphan-guaifenesin) .... Take 1-2 tablets every 12 hours as needed 2)  Zithromax Z-pak 250 Mg Tabs (Azithromycin) .... Take as directed. 3)  Prednisone 10 Mg Tabs (Prednisone) .... 4 tabs for 2 days, then 3 tabs for 2 days, 2 tabs for 2 days, then 1 tab for 2 days, then stop  Complete Medication List: 1)  Adult Aspirin Low Strength 81 Mg Tbdp (Aspirin) .... Once daily 2)  Plavix 75 Mg Tabs (Clopidogrel bisulfate) .... Once daily 3)  Cozaar 100 Mg Tabs (Losartan potassium) .... Once daily 4)  Toprol Xl 50 Mg Tb24 (Metoprolol succinate) .... Once daily 5)  Crestor 20 Mg Tabs (Rosuvastatin calcium) .... Once daily 6)  Keppra 750 Mg Tabs (Levetiracetam) .... Two times a day 7)  Dilantin 100 Mg Caps (Phenytoin sodium extended) .... 4tabs at bedtime 8)  Zoloft 50 Mg Tabs (Sertraline hcl) .... Once daily 9)  Allergy Vaccine 1:10 Go (w-e)  .... Once weekly 10)  Epipen 0.3 Mg/0.60ml (1:1000) Devi (Epinephrine hcl (anaphylaxis)) .... As needed 11)  Amlodipine Besylate 10 Mg Tabs (Amlodipine besylate) .... Once daily 12)   Hydrochlorothiazide 12.5 Mg Tabs (Hydrochlorothiazide) .... Once daily 13)  Ventolin Hfa 108 (90 Base) Mcg/act Aers (Albuterol sulfate) .... Inhale 2 puffs every 4 hours as needed 14)  Symbicort 80-4.5 Mcg/act Aero (Budesonide-formoterol fumarate) .... 2 puffs and rinse 15)  Mucinex Dm 30-600 Mg Xr12h-tab (Dextromethorphan-guaifenesin) .... Take 1-2 tablets every 12 hours as needed 16)  Zithromax Z-pak 250 Mg Tabs (Azithromycin) .... Take as directed. 17)  Prednisone 10 Mg Tabs (Prednisone) .... 4 tabs for 2 days, then 3 tabs for 2 days, 2 tabs for 2 days, then 1 tab for 2 days, then stop  Patient Instructions: 1)  Zpack take as directed.  2)  Prednisone taper over next week  3)  Increase fluids.  4)  Mucinex DM two times a day as needed cough/congestion 5)  Please contact office for sooner follow up if symptoms do not improve or worsen  Prescriptions: PREDNISONE 10 MG TABS (PREDNISONE) 4 tabs for 2 days, then 3 tabs for 2 days, 2 tabs for 2 days, then 1 tab for 2 days, then stop  #20 x 0   Entered and Authorized by:   Rubye Oaks NP   Signed by:   Rubye Oaks NP on 04/23/2009   Method used:   Electronically to        [facility] (retail)       [address]       [place], [place]  [address]       Ph: [phone]       Fax: [phone]   RxID:   [id number] ZITHROMAX Z-PAK 250 MG TABS (AZITHROMYCIN) take as directed.  #1 x 0   Entered and Authorized by:   Rubye Oaks NP   Signed by:   Rubye Oaks NP on 04/23/2009   Method used:   Electronically to        [facility] (retail)       [address]       [place], [place]  [id number]       Ph: [phone]       Fax: [phone]   RxID:   [id number]     Appended Document: neb tx documentation    Clinical Lists Changes  Orders: Added new Service order of Nebulizer Tx (57322) - Signed

## 2010-02-06 NOTE — Progress Notes (Signed)
Summary: refill on prednisone  Phone Note Call from Patient Call back at Home Phone [phone]   Caller: Patient Call For: Brooke Baker Reason for Call: Refill Medication Summary of Call: Requests refill on prednisone 10mg .[name], [place] Initial call taken by: Darletta Moll,  January 15, 2010 10:52 AM  Follow-up for Phone Call        Spoke with pt finished prednisone taper, finished doxycycline 01/14/10 , pt still wheezing ,sleeping in recliner,  wants a refill on prednisone. NKDA [name] RCP, LPN  January 15, 2010 11:05 AM   Additional Follow-up for Phone Call Additional follow up Details #1::        Per CDY-suggest prednisone 10mg  #30 2 daily x  1 week then 1 daily.Reynaldo Minium CMA  January 15, 2010 11:46 AM   rx sent. pt aware. Carron Curie CMA  January 15, 2010 11:50 AM     New/Updated Medications: PREDNISONE 10 MG TABS (PREDNISONE) 2 daily x 1 week and then 1 daily Prescriptions: PREDNISONE 10 MG TABS (PREDNISONE) 2 daily x 1 week and then 1 daily  #30 x 0   Entered by:   Carron Curie CMA   Authorized by:   Waymon Budge MD   Signed by:   Carron Curie CMA on 01/15/2010   Method used:   Electronically to        [facility] (retail)       [address]       [place], [place]  [id number]       Ph: [phone]       Fax: [phone]   RxID:   [id number]

## 2010-02-06 NOTE — Assessment & Plan Note (Signed)
Summary: asthma- ok per td/ cy ///kp   Primary Provider/Referring Provider:  Dale Malheur  CC:  Acute visit pt c/o nasal congestion, prod cough yellow, and wheezing x 24 hrs.  History of Present Illness: April 08, 2009- Allergic rhinitis, asthma.....................................Marland Kitchenhusband here She is feeling well today, but needed over 2 weeks to get over a sustained bronchitis during the winter. She notes little routine wheeze or cough but occasionally needs her rescue inhaler. Continues Symbicort 80 and her allergy vaccine without problems.  April 23, 2009--Presents for an acute office visit. Complains of wheezing, SOB, prod cough with thick light yellow mucus x2weeks. OTC not helping. Denies chest pain, dyspnea, orthopnea, hemoptysis, fever, n/v/d, edema, headache,recent travel or antibiotics.   January 08, 2010- Allergic rhinitis, asthma. Nurse-CC: Acute visit pt c/o nasal congestion, prod cough yellow, wheezing x 24 hrs Acute visit- started wheezing last night. Denies a cold, but went out in cold wind last night and blames that. Continues Symbicort. At baseline uses Ventolin about once daily. When sick like this she is using  Ventolin up to 4 x daily. Denies fever, productive cough, sore throat, nodes, swelling. Has watery sniffing.     Asthma History    Initial Asthma Severity Rating:    Age range: 12+ years    Symptoms: >2 days/week; not daily    Nighttime Awakenings: 0-2/month    Interferes w/ normal activity: no limitations    SABA use (not for EIB): several times per day    Asthma Severity Assessment: Severe Persistent   Preventive Screening-Counseling & Management  Alcohol-Tobacco     Smoking Status: never  Current Medications (verified): 1)  Adult Aspirin Low Strength 81 Mg  Tbdp (Aspirin) .... Once Daily 2)  Plavix 75 Mg  Tabs (Clopidogrel Bisulfate) .... Once Daily 3)  Cozaar 100 Mg  Tabs (Losartan Potassium) .... Once Daily 4)  Toprol Xl 50 Mg  Tb24  (Metoprolol Succinate) .... Once Daily 5)  Crestor 20 Mg  Tabs (Rosuvastatin Calcium) .... Once Daily 6)  Keppra 750 Mg  Tabs (Levetiracetam) .... Two Times A Day 7)  Dilantin 100 Mg  Caps (Phenytoin Sodium Extended) .... 4tabs At Bedtime 8)  Zoloft 50 Mg  Tabs (Sertraline Hcl) .... Once Daily 9)  Allergy Vaccine 1:10 Go (W-E) .... Once Weekly 10)  Epipen 0.3 Mg/0.70ml (1:1000)  Devi (Epinephrine Hcl (Anaphylaxis)) .... As Needed 11)  Amlodipine Besylate 10 Mg  Tabs (Amlodipine Besylate) .... Once Daily 12)  Hydrochlorothiazide 12.5 Mg  Tabs (Hydrochlorothiazide) .... Once Daily 13)  Ventolin Hfa 108 (90 Base) Mcg/act  Aers (Albuterol Sulfate) .... Inhale 2 Puffs Every 4 Hours As Needed 14)  Symbicort 80-4.5 Mcg/act Aero (Budesonide-Formoterol Fumarate) .... 2 Puffs and Rinse 15)  Mucinex Dm 30-600 Mg Xr12h-Tab (Dextromethorphan-Guaifenesin) .... Take 1-2 Tablets Every 12 Hours As Needed  Allergies (verified): No Known Drug Allergies  Past History:  Past Medical History: Last updated: 09/27/2007 ALLERGIC RHINITIS (ICD-477.9) ASTHMATIC BRONCHITIS, ACUTE (ICD-466.0) CEREBROVASCULAR ACCIDENT (ICD-434.91) CORONARY ARTERY DISEASE (ICD-414.00)  Past Surgical History: Last updated: 01/23/2008 Lumbar spi ne x 3 hysterectomy right hip replacement right knee replacement.  Family History: Last updated: 02/04/2009 allergies - brother, sister asthma - brother, sister rheumatism - sister, brother cancer - sister (Non-Hodgkins Lymphoma) DM - brother stroke -  mother  Social History: Last updated: 02/04/2009 Patient never smoked.  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  The patient denies shortness of breath at rest, productive cough, coughing up  blood, chest pain, irregular heartbeats, acid heartburn, indigestion, loss of appetite, weight change, abdominal pain, difficulty swallowing, sore throat, tooth/dental problems, headaches, sneezing, hand/feet swelling, rash, change in color of mucus, and fever.    Vital Signs:  Patient profile:   76 year old female Height:      67 inches Weight:      251.8 pounds BMI:     39.58 O2 Sat:      95 % on Room air Pulse rate:   65 / minute BP sitting:   124 / 60  (left arm) Cuff size:   large  Vitals Entered By: Renold Genta RCP, LPN (January 08, 2010 4:02 PM)  O2 Flow:  Room air CC: Acute visit pt c/o nasal congestion, prod cough yellow, wheezing x 24 hrs Comments Medications reviewed with patient Renold Genta RCP, LPN  January 08, 2010 4:05 PM    Physical Exam  Additional Exam:  General: A/Ox3; pleasant and cooperative, NAD, overweight SKIN: no rash, lesions NODES: no lymphadenopathy HEENT: [place]/AT, EOM- WNL, Conjuctivae- clear,  TM-WNL, Nose- pale w/ clear discharge, Throat- clear and wnl, mild hoarseness, dentures, Mallampati  III.            Sniffing NECK: Supple w/ fair ROM, JVD- none, normal carotid impulses w/o bruits Thyroid- normal to palpation CHEST: coarse BS w/ faint exp wheezing, raspy cough HEART: RRR, no m/g/r heard ABDOMEN:obese WGN:FAOZ, nl pulses, no edema  NEURO: Grossly intact to observation      Impression & Recommendations:  Problem # 1:  ASTHMATIC BRONCHITIS, ACUTE (ICD-466.0)  Acute bronchitis exacerbation This is likely a viral infection at this time of year.  We discussed what has worked for her and what meds to rotate, compared acute rescue to maintenance meds.   The following medications were removed from the medication list:    Zithromax Z-pak 250 Mg Tabs (Azithromycin) .[name]... Take as directed.    Zithromax Z-pak 250 Mg Tabs (Azithromycin) .[name]... As directed Her updated medication list for this problem includes:    Ventolin Hfa 108 (90 Base)  Mcg/act Aers (Albuterol sulfate) ..... Inhale 2 puffs every 4 hours as needed    Symbicort 80-4.5 Mcg/act Aero (Budesonide-formoterol fumarate) .[name]... 2 puffs and rinse    Mucinex Dm 30-600 Mg Xr12h-tab (Dextromethorphan-guaifenesin) .[name]... Take 1-2 tablets every 12 hours as needed    Doxycycline Hyclate 100 Mg Caps (Doxycycline hyclate) .[name]... 2 today then one daily  Problem # 2:  ALLERGIC RHINITIS (ICD-477.9)  We discussed goals and risks of allergy shots. She wants to continue.   Medications Added to Medication List This Visit: 1)  Doxycycline Hyclate 100 Mg Caps (Doxycycline hyclate) .... 2 today then one daily 2)  Prednisone 10 Mg Tabs (Prednisone) .[name].. 1 tab four times daily x 2 days, 3 times daily x 2 days, 2 times daily x 2 days, 1 time daily x 2 days  Other Orders: Est. Patient Level III (30865) Nebulizer Tx (78469) Depo- Medrol 80mg  (J1040) Admin of Therapeutic Inj  intramuscular or subcutaneous (62952)  Patient Instructions: 1)  Please schedule a follow-up appointment in 6 months, earlier as needed 2)  neb xop 1.25 3)  depo 80 4)  scripts to hold for prednisone and for doxycycline.  Prescriptions: PREDNISONE 10 MG TABS (PREDNISONE) 1 tab four times daily x  2 days, 3 times daily x 2 days, 2 times daily x 2 days, 1 time daily x 2 days  #20 x 0   Entered and Authorized by:   Waymon Budge MD   Signed by:   Waymon Budge MD on 01/08/2010   Method used:   Print then Give to Patient   RxID:   [id number] DOXYCYCLINE HYCLATE 100 MG CAPS (DOXYCYCLINE HYCLATE) 2 today then one daily  #8 x 0   Entered and Authorized by:   Waymon Budge MD   Signed by:   Waymon Budge MD on 01/08/2010   Method used:   Print then Give to Patient   RxID:   [id number]    Medication Administration  Injection # 1:    Medication: Depo- Medrol 80mg     Diagnosis: ASTHMATIC BRONCHITIS, ACUTE (ICD-466.0)    Route: IM    Site: LUOQ gluteus    Exp Date: 02/08/2010    Lot #: [id number]     Mfr: Teva    Patient tolerated injection without complications    Given by: Renold Genta RCP, LPN (January 08, 2010 4:32 PM)  Orders Added: 1)  Est. Patient Level III [29528] 2)  Nebulizer Tx [41324] 3)  Depo- Medrol 80mg  [J1040] 4)  Admin of Therapeutic Inj  intramuscular or subcutaneous [40102]

## 2010-02-27 ENCOUNTER — Ambulatory Visit (INDEPENDENT_AMBULATORY_CARE_PROVIDER_SITE_OTHER): Payer: 59

## 2010-02-27 DIAGNOSIS — J301 Allergic rhinitis due to pollen: Secondary | ICD-10-CM

## 2010-05-20 NOTE — Assessment & Plan Note (Signed)
[facility]                             PULMONARY OFFICE NOTE   NAME:Brooke Baker, Brooke Baker                     MRN:          [id number]  DATE:09/15/2006                            DOB:          10-14-1934    PROBLEM LIST:  1. Asthmatic bronchitis.  2. Allergic rhinitis.  3. Coronary disease/stent.  4. Cerebrovascular accident.   HISTORY:  Three days burning nose and throat, cough moving into chest  scant green, question some fever.  She had felt she was doing well using  QVAR 80 and rescue albuterol with very occasional over-the-counter Chlor-  Trimeton.   MEDICATIONS:  We reviewed her medication list including:  1. Aspirin 81 mg.  2. Calcium.  3. Plavix 75 mg.  4. Cozaar 100 mg.  5. Toprol XL 25 mg.  6. Crestor 5 mg.  7. Keppra 750 mg b.i.d.  8. Dilantin 400 mg.  9. Zoloft 50 mg.  10.QVAR 80, one puff b.i.d.  11.Allergy vaccine.  Her daughter gives her allergy shots.  12.Rescue albuterol inhaler.  13.She does have an EpiPen which has never been needed.   No medication allergy.   OBJECTIVE:  VITAL SIGNS:  Weight 235 pounds, BP 138/68, pulse 66, room  air saturation 96%.  HEENT:  She is sniffing and blowing.  Throat not red.  No adenopathy.  LUNGS:  Bilateral mild wheeze.  No accessory muscle use.  CARDIAC:  Pulse is regular.  EXTREMITIES:  No edema.   IMPRESSION:  Probable viral syndrome, upper respiratory infection with  exacerbation of asthma and rhinitis.   PLAN:  1. Prednisone 8-day taper with steroid talk.  2. Xopenex nebulizer treatment 1.25 mg.  3. Depo-Medrol 80 mg IM.  4. She is given a Z-Pak prescription to hold with discussion.  5. Keep pending appointment, earlier p.r.n.     Clinton D. Maple Hudson, MD, Tonny Bollman, FACP  Electronically Signed    CDY/MedQ  DD: 09/18/2006  DT: 09/19/2006  Job #: [id number]

## 2010-05-20 NOTE — Assessment & Plan Note (Signed)
[facility]                             PULMONARY OFFICE NOTE   NAME:Brooke Baker, Brooke Baker                     MRN:          [id number]  DATE:07/19/2006                            DOB:          [birth year]    HISTORY OF PRESENT ILLNESS:  The patient is a very pleasant 76 year old  white female patient of Dr. Maple Hudson who has a known history of asthmatic  bronchitis, allergic rhinitis, presents today for an acute office visit.  The patient complains over the last 3 days she has had productive cough  with thick, green, yellow sputum, shortness of breath and wheezing.  The  patient denies any chest pain, orthopnea, PND, hemoptysis or a fever.   PAST MEDICAL HISTORY:  Reviewed.   CURRENT MEDICATIONS:  Reviewed.   PHYSICAL EXAMINATION:  The patient is a pleasant obese female in no  acute distress.  She is afebrile with stable vital signs.  O2 saturation is 96% on room  air.  HEENT:  Unremarkable.  NECK:  Supple without adenopathy. No JVD.  LUNGS:  Reveal coarse breath sounds bilaterally.  No wheezing noted.  CARDIAC:  Regular rate and rhythm.  ABDOMEN:  Morbidly obese and soft.  EXTREMITIES:  Warm without any calf tenderness, cyanosis, clubbing.  There is trace edema.   PLAN:  Acute asthmatic bronchitic exacerbation.  The patient is to begin  Omnicef x7 days.  Mucinex DM twice daily.  Prednisone taper over the  next week.  Patient advised to increase Qvar up to 2 puffs twice daily  over the next 7 days and rinse well afterwards.  The patient will return  back with Dr. Maple Hudson in 1 month for followup or sooner if needed.      Rubye Oaks, NP  Electronically Signed      Clinton D. Maple Hudson, MD, Tonny Bollman, FACP  Electronically Signed   TP/MedQ  DD: 07/19/2006  DT: 07/19/2006  Job #: [id number]

## 2010-05-23 NOTE — Op Note (Signed)
Brooke Baker, Brooke Baker              ACCOUNT NO.:  [id number]   MEDICAL RECORD NO.:  [id number]          PATIENT TYPE:  INP   LOCATION:  5017                         FACILITY:  [facility]   PHYSICIAN:  Payton Doughty, M.D.      DATE OF BIRTH:  Apr 26, 1934   DATE OF PROCEDURE:  07/21/2005  DATE OF DISCHARGE:                                 OPERATIVE REPORT   PREOPERATIVE DIAGNOSIS:  Spondylosis, L4-5.   POSTOPERATIVE DIAGNOSIS:  Spondylosis, L4-5.   OPERATIVE PROCEDURE:  L4-5 laminotomy and foraminotomy done bilaterally.   SURGEON:  Trey Sailors, M.D.   ANESTHESIA:  General endotracheal.   PREPARATION:  Sterilely prepped and draped, scrubbed with alcohol wipe.   COMPLICATIONS:  None.   NEURO ASSISTANTS:  [place], __________ and Lovell Sheehan.   BODY OF TEXT:  This is a 76 year old lady with spinal stenosis and  neurogenic claudication.  She was taken to the operating room, smoothly  anesthetized and  intubated and placed prone on the operating table, shaved,  prepped and draped in the usual sterile fashion.  The skin was infiltrated  with 1% lidocaine with 1:400,000 epinephrine.  The skin was incised from  __________ to mid L5 and successive laminae were exposed until it could be  radiographically confirmed we were at L4.  Once radiographic confirmation of  L4 was obtained, bilateral laminotomy and foraminotomy were carried out  using a high-speed drill and the Kerrison.  The lamina was opened to the top  of ligamentum flavum, and it was removed in a retrograde fashion.  The  median portion of the superior facet of L5 was also removed.  This resulted  in decompression of the thecal sac.  Both the neuroforamen and sac were  carefully explored with a hook and found to be open.  Wound was irrigated,  hemostasis assured.  Depo-Medrol soaked fat was placed in the laminotomy  defects.  Successive layers of 0-Vicryl, 2-0 Vicryl, and 3-0 nylon were used  to close the fascia and muscle, subcutaneous  tissues and skin, respectively.  Betadine and Telfa dressing was applied and made occlusive with OpSite.  The  patient returned to the recovery room in good condition.           ______________________________  Payton Doughty, M.D.     MWR/MEDQ  D:  07/21/2005  T:  07/22/2005  Job:  [id number]

## 2010-05-23 NOTE — Assessment & Plan Note (Signed)
 HEALTHCARE                               PULMONARY OFFICE NOTE   NAME:Brooke Baker, Brooke Baker                     MRN:          [id number]  DATE:08/13/2005                            DOB:          [birth year]    CHIEF COMPLAINT:  Wheezing.   This is a pleasant 76 year old white female with a know history of asthma.  Presents today with a one-day history of increasing wheezing and cough,  which she reports onset began after exposure to the high heat yesterday.  She reports using her rescue albuterol bronchodilator in excess of three  times  today whereby at baseline, she typically does not need this at all.  She reports occasional cough which is nonproductive at this time.  She  denies fevers or chills.  No wet leg swelling or chest pain.   MEDICATIONS:  Medication list reviewed as well as allergy list.   PHYSICAL EXAMINATION:  VITAL SIGNS:  Temperature 97.7, blood pressure  138/70, heart rate 62, saturation 96% on room air.  HEENT:  Normocephalic, no JVD or adenopathy.  Posterior pharynx is slightly  erythremic.  PULMONARY:  Diffuse expiratory wheezes, no accessory muscle use.  CARDIAC:  Regular rate and rhythm.  EXTREMITIES:  No edema.   IMPRESSION AND PLAN:  Acute asthmatic exacerbation.  Plan for this is to  treat with a prednisone taper.  Will begin 60 mg orally today then 40 mg  orally beginning tomorrow.  She will take four tabs daily x3 days and then  decrease by 10 mg every 3 days until steroids are gone.  Also provided her  with a prescription of azithromycin, however, told her not to fill this  unless she developed purulent sputum or fever.  She was instructed to return  to the office for further followup should her symptoms worsen.                                   Zenia Resides, NP                                Clinton D. Maple Hudson, MD, FCCP, FACP   PB/MedQ  DD:  08/13/2005  DT:  08/13/2005  Job #:  [id number]

## 2010-05-23 NOTE — H&P (Signed)
Brooke Baker, Brooke Baker              ACCOUNT NO.:  [id number]   MEDICAL RECORD NO.:  [id number]          PATIENT TYPE:  INP   LOCATION:  2899                         FACILITY:  [facility]   PHYSICIAN:  Payton Doughty, M.D.      DATE OF BIRTH:  1934/07/22   DATE OF ADMISSION:  07/21/2005  DATE OF DISCHARGE:                                HISTORY & PHYSICAL   ADMISSION DIAGNOSIS:  Spondylosis, L4-5.   SERVICE:  Neurosurgery.   BODY OF TEXT:  A very nice 76 year old right-handed white lady with pain in  her back, worse when she has been up and about.  She has proximal leg pain.  Weakness in the legs after she has been up a bit.  Legs do not give out on  her.  She had, apparently, an L5-S1 disk done some years ago and has had a  laminectomy of L5.  MR and myelography demonstrates severe stenosis at L4-5,  and she is now admitted for a laminectomy and foraminotomy at that level.   PAST MEDICAL HISTORY:  Remarkable for COPD, hypertension, coronary artery  disease.  She had a stent placed last August.   MEDICATIONS:  Calcium, coated aspirin, Plavix, Cozaar 100 mg a day, Centrum  Silver, Crestor 5 mg a day, Toprol XL 25 mg a day.  She had a TIA and has  had some seizures.  She uses Keppra 2000 mg twice daily, Dilantin 400 mg in  the evening, and Zoloft 50 mg a day.  She uses an albuterol puffer twice a  day.   She has no allergies.   PAST SURGICAL HISTORY:  Knee replacement, lumbar diskectomy in 2004, and a  hysterectomy in 1967.   FAMILY HISTORY:  Both parents are deceased.  There is a history of diabetes  and hypertension.   SOCIAL HISTORY:  She does not smoke, does not drink, and is retired.   REVIEW OF SYSTEMS:  Remarkable for wearing glasses, balanced disturbance,  nasal congestion, hypertension, hypercholesterolemia, COPD.  She has had a  compression fracture at L1.  She has back pain, leg pain, arthritis,  seizures, difficulty with memory, difficulty concentrating.   PHYSICAL  EXAMINATION:  HEENT:  Within normal limits.  NECK:  She has reasonable range of motion of her neck.  CHEST:  Diffuse crackles and wheezes.  CARDIAC:  Regular rate and rhythm.  ABDOMEN:  Nontender.  No hepatosplenomegaly.  EXTREMITIES:  Without clubbing, cyanosis or edema.  GU:  Deferred.  PERIPHERAL PULSES:  Good.  NEUROLOGIC:  She is awake, alert and oriented.  Cranial nerves are intact.  Motor exam shows 5/5 strength throughout the upper and lower extremities.  There is no current sensory deficit.  Reflexes are absent in her knees and  ankles.   CLINICAL IMPRESSION:  1.  Lumbar stenosis.  MR demonstrates spinal stenosis at L4-5.  It appears      as if the lamina of L5 has been removed.  The stenosis extends from      approximately the inferior aspect of the lamina of L4 superiorly to      about  the middle of the L4 vertebral body.  2.  Neurogenic claudication secondary to spinal stenosis.   PLAN:  Bilateral laminectomy and foraminotomy at L4-5.  The risks and  benefits of this approach have been discussed with her, and she wishes to  proceed.           ______________________________  Payton Doughty, M.D.     MWR/MEDQ  D:  07/21/2005  T:  07/21/2005  Job:  [id number]

## 2010-05-23 NOTE — Op Note (Signed)
NAMEMOE, BRIER              ACCOUNT NO.:  [id number]   MEDICAL RECORD NO.:  [id number]          PATIENT TYPE:  INP   LOCATION:  3306                         FACILITY:  [facility]   PHYSICIAN:  Payton Doughty, M.D.      DATE OF BIRTH:  Feb 26, 1934   DATE OF PROCEDURE:  11/25/2005  DATE OF DISCHARGE:                                 OPERATIVE REPORT   PREOPERATIVE DIAGNOSIS:  Spondylosis, L4-5.   POSTOPERATIVE DIAGNOSES:  Pars fracture, L4-5; herniated disk, L4-5.   PROCEDURES:  L4-5 laminectomy, diskectomy, L4 to S1 segmental pedicle screw  fixation, and L4 to S1 posterolateral arthrodesis.   SURGEON:  Payton Doughty, M.D.   DOCTOR ASSISTANT:  Stefani Dama, M.D.   NURSE ASSISTANT:  [place].   SERVICE:  Neurosurgery.   ANESTHESIA:  General endotracheal.   PREPARATION:  Prep was done with an alcohol wipe.   COMPLICATIONS:  None.   BODY OF TEXT:  This is a 76 year old lady, who had a prior decompressive  laminectomy by another doctor at L5, and then a bilateral laminotomy and  foraminotomy at L4-5 in July, had done well for a while, then had marked  increase in pain in her back and down her legs.  Taken to the operating  room, smoothly anesthetized, intubated, placed prone on the operating table.  Following shave, prep and drape in the usual sterile fashion, the skin was  incised from the middle of L3, the middle of S1 and the transverse processes  and remaining lamina of L4 and L5 were exposed bilaterally in a  subperiosteal plane.  Intraoperative x-ray confirmed correctness of the  level.  Working through the scar, the facet joints at L4-5 and L5-S1 were  exposed and it was found that the pars were fractured bilaterally at L4.  The pars and the inferior facet of L4 and the superior facet of L5 were  removed using the high-speed drill, and the 4-5 interspace decompressed.  Working from the left side, diskectomy was carried out at the L4-5  interspace and the anterior epidural  space carefully explored and found to  be free.  Pedicle screws were then placed at L4, L5 and S1 bilaterally using  the standard landmarks.  Intraoperative x-ray showed good placement of  pedicle screws.  They were attached to the rod and capped.  The caps were  locked.  The transverse processes and sacral ala were decorticated with a  high-speed drill and BMP as OP1 and the extender matrix were placed.  Final  x-  rays showed good placement of the rods and screws.  The entire affair was  closed in successive layers of 0 Vicryl, 2-0 Vicryl and 3-0 nylon.  Betadine  and a Telfa dressing were applied and made occlusive with an OpSite, and the  patient returned to the recovery room in good condition.           ______________________________  Payton Doughty, M.D.     MWR/MEDQ  D:  11/25/2005  T:  11/26/2005  Job:  [id number]

## 2010-05-23 NOTE — H&P (Signed)
NAMEDE, LIBMAN              ACCOUNT NO.:  [id number]   MEDICAL RECORD NO.:  [id number]          PATIENT TYPE:  INP   LOCATION:  3306                         FACILITY:  [facility]   PHYSICIAN:  Payton Doughty, M.D.      DATE OF BIRTH:  1934-06-18   DATE OF ADMISSION:  11/25/2005  DATE OF DISCHARGE:                                HISTORY & PHYSICAL   ADMITTING DIAGNOSIS:  Herniated disk L4-5.   BODY OF TEXT:  This is a 76 year old right-handed white lady who I operated  on in July.  She did extremely well with a bilateral laminotomy and  foraminotomy at L3-4.  About 1-1/2 months ago, she was bending over to open  a drawer, felt a snap in her back, and developed pain in her back and down  her left lower extremity.  An MR was obtained that showed a disk at L4-5.  Her symptoms are not resolving and she is now admitted for decompression and  fusion at 4-5 and fusion at 5-1.   PAST MEDICAL HISTORY:  Remarkable for:  1. COPD.  2. Hypertension.  3. Coronary artery disease.  She had a stent placed in August 2006.   CURRENT MEDICATIONS:  Calcium, aspirin, Plavix, Cozaar, Centrum Silver,  Crestor, Toprol, Keppra, dilantin, and Zoloft.  She uses an albuterol puffer  twice a day.   ALLERGIES:  NONE.   SURGICAL HISTORY:  1. Knee replacement.  2. Lumbar disk in 2004, which entailed a total laminectomy at L5.  3. Hysterectomy in 1967.   SOCIAL HISTORY:  She does not smoke.  She does not drink.  She is retired.   FAMILY HISTORY:  Both parents are deceased.  There is a history of diabetes  and hypertension in the family.   REVIEW OF SYSTEMS:  Remarkable for wearing glasses, __________, nasal  congestion, hypertension, hypercholesterolemia, COPD, an old compression  fracture at C1, back pain, leg pain, arthritis, seizures, difficulty with  memory, and difficulty concentrating.   PHYSICAL EXAMINATION:  HEENT:  Exam within normal limits.  NECK:  She has good range of motion of her neck.  CHEST:  Diffuse crackles and wheezes.  CARDIAC EXAM:  Regular rate and rhythm.  ABDOMEN:  Nontender and somewhat large.  No hepatosplenomegaly.  EXTREMITIES:  Without clubbing, cyanosis, or edema.  GU:  Exam deferred.  EXTREMITIES:  Peripheral pulses are good.  NEUROLOGICAL:  She is awake, alert, and oriented.  Her cranial nerves are  intact.  Motor exam shows 5/5 strength throughout the upper and lower  extremities.  She has bilaterally positive straight leg raise and bilateral  L5 sensory deficit.  Reflexes are absent at the knees and ankles.   MR results have been reviewed above.   CLINICAL IMPRESSION:  Bilateral L5 radiculopathies related to disk at L4-5.   PLAN:  Laminectomy, diskectomy, posterior lumbar interbody fusion at L4-5,  pedicle screws from L4- S1, and posterolateral arthrodesis.  The risks and  benefits of this approach have been discussed with her and she wishes to  proceed.  ______________________________  Payton Doughty, M.D.     MWR/MEDQ  D:  11/25/2005  T:  11/25/2005  Job:  [id number]

## 2010-05-23 NOTE — Discharge Summary (Signed)
NAMESUSEN, HASKEW              ACCOUNT NO.:  [id number]   MEDICAL RECORD NO.:  [id number]          PATIENT TYPE:  INP   LOCATION:  3009                         FACILITY:  [facility]   PHYSICIAN:  Payton Doughty, M.D.      DATE OF BIRTH:  July 06, 1934   DATE OF ADMISSION:  11/25/2005  DATE OF DISCHARGE:  11/29/2005                               DISCHARGE SUMMARY   ADMITTING DIAGNOSIS:  Herniated disk and spondylosis, L4-5, L5-S1.   DISCHARGE DIAGNOSIS:  Herniated disk and spondylosis, L4-5, L5-S1.   OPERATIVE PROCEDURES:  L4-5, L5-S1 laminectomy, discectomy, posterior  lumbar interbody fusion, posterior lateral arthrodesis.   SERVICE:  Neurosurgery.   COMPLICATIONS:  None.   DISCHARGE STATUS:  Alive and well.   HISTORY AND PHYSICAL:  This is a 76 year old lady whose history and  physical is recounted on the chart.  She had a lumbar spondylosis for a  number of years and is worsening.  She was admitted after ascertainment  of normal laboratory values and underwent fusion at 4-5 and 5-1.   Postoperatively, she did well.  She was found to have bilateral pars  posterior fractures.  She was participating in physical therapy on the  22nd and 23rd.  Foley was removed 23rd.  She did have one fall, which  did not cause any difficulty.  On November 25, her strength is full, her  incision is dry, and she is able to take care of her needs at home, and  she was discharged home.   [facility]           ______________________________  Payton Doughty, M.D.     MWR/MEDQ  D:  02/03/2006  T:  02/03/2006  Job:  [id number]

## 2010-06-16 ENCOUNTER — Other Ambulatory Visit: Payer: Self-pay | Admitting: *Deleted

## 2010-06-16 MED ORDER — ALBUTEROL SULFATE HFA 108 (90 BASE) MCG/ACT IN AERS: 2.0000 | INHALATION_SPRAY | RESPIRATORY_TRACT | Status: DC | PRN

## 2010-07-04 ENCOUNTER — Encounter: Payer: Self-pay | Admitting: Internal Medicine

## 2010-07-07 ENCOUNTER — Ambulatory Visit (INDEPENDENT_AMBULATORY_CARE_PROVIDER_SITE_OTHER)
Admission: RE | Admit: 2010-07-07 | Discharge: 2010-07-07 | Disposition: A | Discharge status: Home / Self Care | Payer: 59 | Source: Ambulatory Visit | Attending: Internal Medicine | Admitting: Internal Medicine

## 2010-07-07 ENCOUNTER — Encounter: Payer: Self-pay | Admitting: Internal Medicine

## 2010-07-07 ENCOUNTER — Ambulatory Visit (INDEPENDENT_AMBULATORY_CARE_PROVIDER_SITE_OTHER): Payer: 59 | Admitting: Internal Medicine

## 2010-07-07 VITALS — BP 126/56 | HR 57 | Ht 66.0 in | Wt 249.4 lb

## 2010-07-07 DIAGNOSIS — J45909 Unspecified asthma, uncomplicated: Secondary | ICD-10-CM

## 2010-07-07 MED ORDER — ALBUTEROL SULFATE (2.5 MG/3ML) 0.083% IN NEBU
2.5000 mg | INHALATION_SOLUTION | Freq: Once | RESPIRATORY_TRACT | Status: AC
  Administered 2010-07-07: 2.5 mg via RESPIRATORY_TRACT

## 2010-07-07 NOTE — Patient Instructions (Signed)
Order- CXR--  DX Asthma  Neb Xop 0.63. Please notice if this treatment seems to help your breathing this afternoon.

## 2010-07-07 NOTE — Progress Notes (Signed)
  Subjective:    Patient ID: Brooke Baker, female    DOB: 1934-05-01, 76 y.o.   MRN: [id number]  HPI 07/07/10- 49 yoF never smoker followed for Asthma, allergic rhinitis, complicated by hx CAD, CVA. Last here January 08, 2010 - note reviewed.  She is staying in mostly, in hot weather, but tanned from tanning bed for psoriasis. Has been more aware of dyspnea for months, more with exertion than at rest. She has noted a little wheeze, but main concern is with her heart. Can't sleep supine due to dyspnea, but better on either side. Nocturia x 2. Denies productive cough,  chest pain or palpitation. She has cardiologist at [facility]. PCP is Dr Dale [place], who saw her recently and has ordered blood work, pending.  No recent CXR.  Continues allergy vaccine- feels the shots  have helped her. 1:10 GO.  Using ventolin rescue inhaler 3-4 x recently, symbicort twice daily. Not on prednisone now.   Review of Systems Constitutional:   No weight loss, night sweats,  Fevers, chills, fatigue, lassitude. HEENT:   No headaches,  Difficulty swallowing,  Tooth/dental problems,  Sore throat,                No sneezing, itching, ear ache, nasal congestion, post nasal drip,   CV:  No chest pain, orthopnea, PND, swelling in lower extremities, anasarca, dizziness, palpitations  GI  No heartburn, indigestion, abdominal pain, nausea, vomiting, diarrhea, change in bowel habits, loss of appetite  Resp: .  No excess mucus, no productive cough,  No non-productive cough,  No coughing up of blood.  No change in color of mucus.  No wheezing.    Skin: no rash or lesions.  GU: no dysuria, change in color of urine, no urgency or frequency.  No flank pain.  MS:  No joint pain or swelling.  No decreased range of motion.  No back pain.  Psych:  No change in mood or affect. No depression or anxiety.  No memory loss.     Objective:   Physical Exam General- Alert, Oriented, Affect-appropriate, Distress- none acute   Obese, relaxed  Skin- rash-none, lesions- none, excoriation- none  Lymphadenopathy- none  Head- atraumatic  Eyes- Gross vision intact, PERRLA, conjunctivae clear secretions  Ears- Hearing, canals, Tm- normal  Nose- Clear, No- Septal dev, mucus, polyps, erosion, perforation   Throat- Mallampati II , mucosa clear , drainage- none, tonsils- atrophic  Neck- flexible , trachea midline, no stridor , thyroid nl, carotid no bruit  Chest - symmetrical excursion , unlabored     Heart/CV- RRR , no murmur , no gallop  , no rub, nl s1 s2                     - JVD- none , edema- none, stasis changes- none, varices- none    Very heavy legs, WITHOUT cords, pitting, Homan's negative.     Lung- clear to P&A, , cough- none , dullness-none, rub- none    Slight expiratory wheeze, more like upper airway VCD that true wheeze     Chest wall-   Abd- tender-no, distended-no, bowel sounds-present, HSM- no  Br/ Gen/ Rectal- Not done, not indicated  Extrem- cyanosis- none, clubbing, none, atrophy- none, strength- nl  Neuro- grossly intact to observation         Assessment & Plan:

## 2010-07-07 NOTE — Assessment & Plan Note (Signed)
Increased dyspnea. I am not impressed with bronchospasm. We will get CXR and try neb Xopenex. Dr Lorin Picket has ordered labs, so I won't give steroid or chance lab duplication now. Hopefully she will be getting D-dimer and BNP

## 2010-07-10 NOTE — Progress Notes (Signed)
Quick Note:  Pt aware of results. ______ 

## 2010-07-11 ENCOUNTER — Telehealth: Payer: Self-pay | Admitting: Internal Medicine

## 2010-07-11 MED ORDER — PREDNISONE 10 MG PO TABS: ORAL_TABLET | ORAL | Status: DC

## 2010-07-11 MED ORDER — AZITHROMYCIN 250 MG PO TABS: ORAL_TABLET | ORAL | Status: AC

## 2010-07-11 NOTE — Telephone Encounter (Signed)
Please offer pred 8 day taper: 10 mg, # 20, 4 X 2 DAYS, 3 X 2 DAYS, 2 X 2 DAYS, 1 X 2 DAYS And Z pak

## 2010-07-11 NOTE — Telephone Encounter (Signed)
Called, spoke with pt.  She was informed CDY recs she take a 8 day pred taper and z pak.  She verbalized understanding of this and is aware rxs sent to pharm.

## 2010-07-11 NOTE — Telephone Encounter (Signed)
Called, spoke with pt.  C/o prod cough with light yellow mucus, increased SOB, wheezing - worse since OV on 07/07/10.  Denies f/c/s.  Requesting prednisone and ? Abx.  nkda - verified.  [place].  Dr. Maple Hudson, pls advise.  Thanks!

## 2010-07-17 ENCOUNTER — Other Ambulatory Visit: Payer: Self-pay | Admitting: *Deleted

## 2010-07-17 MED ORDER — ALBUTEROL SULFATE HFA 108 (90 BASE) MCG/ACT IN AERS: 2.0000 | INHALATION_SPRAY | RESPIRATORY_TRACT | Status: DC | PRN

## 2010-08-13 ENCOUNTER — Telehealth: Payer: Self-pay | Admitting: Internal Medicine

## 2010-08-13 ENCOUNTER — Ambulatory Visit (INDEPENDENT_AMBULATORY_CARE_PROVIDER_SITE_OTHER): Payer: 59

## 2010-08-13 DIAGNOSIS — J309 Allergic rhinitis, unspecified: Secondary | ICD-10-CM

## 2010-08-13 MED ORDER — AZITHROMYCIN 250 MG PO TABS: ORAL_TABLET | ORAL | Status: AC

## 2010-08-13 MED ORDER — PREDNISONE 10 MG PO TABS: ORAL_TABLET | ORAL | Status: DC

## 2010-08-13 NOTE — Telephone Encounter (Signed)
rx sent. Pt aware.Brooke Baker, CMA  

## 2010-08-13 NOTE — Telephone Encounter (Signed)
Spoke with the pt and she c/o wheezing, increased SOB, productive cough with yellow phlegm x 5 days. She has tried Robitussin OTC without relief. Pt requesting prednisone and abx. Please advise. Carron Curie, CMA No Known Allergies

## 2010-08-13 NOTE — Telephone Encounter (Signed)
Per CY-okay to give Prednisone 10mg  #20 take 4 x 2 days, 3 x 2 days, 2 x 2 days, 1 x 2 days, then stop no refills. Also give Zpak #1 take as directed no refills.

## 2010-09-22 ENCOUNTER — Telehealth: Payer: Self-pay | Admitting: Internal Medicine

## 2010-09-22 MED ORDER — PREDNISONE 10 MG PO TABS: ORAL_TABLET | ORAL | Status: DC

## 2010-09-22 MED ORDER — AZITHROMYCIN 250 MG PO TABS: ORAL_TABLET | ORAL | Status: AC

## 2010-09-22 NOTE — Telephone Encounter (Signed)
Spoke with CY-okay for patient to have Zpak and Prednisone 10mg  #20 take 4 x 2 days, 3 x 2 days, 2 x 2 days, 1 x 2 days then stop; No refills on either rx. Pt aware we sent Rx's electronically to pharmacy on file.

## 2010-09-22 NOTE — Telephone Encounter (Signed)
Pt calling again in reference to her rx states that her pharmacy closes at 5:30p she can be reached at [phone].Brooke Baker

## 2010-09-30 ENCOUNTER — Telehealth: Payer: Self-pay | Admitting: Internal Medicine

## 2010-09-30 MED ORDER — PREDNISONE 10 MG PO TABS: ORAL_TABLET | ORAL | Status: DC

## 2010-09-30 NOTE — Telephone Encounter (Signed)
Per CY-okay to give Prednisone 10 mg #20 take 4 x 2 days, 3 x 2 days, 2 x 2 days, 1 x 2 days then stop no refills.  

## 2010-09-30 NOTE — Telephone Encounter (Signed)
I spoke with Brooke Baker and she states she is still experiencing wheezing, increase SOB, chest tightness, cough w/ white phlem. Brooke Baker states she does not need an abx but wants prednisone. Please advise Dr. Maple Hudson. Thanks  No Known Allergies  Carver Fila, CMA

## 2010-09-30 NOTE — Telephone Encounter (Signed)
Pt aware that rx for prednisone was sent. I advised that if this does not help she probably needs ov, this is the 5th time we have called in taper since her last ov in July. Pt verbalized understanding.

## 2010-11-05 ENCOUNTER — Other Ambulatory Visit: Payer: Self-pay | Admitting: Internal Medicine

## 2010-11-05 MED ORDER — ALBUTEROL SULFATE HFA 108 (90 BASE) MCG/ACT IN AERS: 2.0000 | INHALATION_SPRAY | RESPIRATORY_TRACT | Status: DC | PRN

## 2010-11-11 ENCOUNTER — Ambulatory Visit: Payer: 59 | Admitting: Internal Medicine

## 2010-12-17 ENCOUNTER — Other Ambulatory Visit: Payer: Self-pay | Admitting: *Deleted

## 2010-12-17 MED ORDER — BUDESONIDE-FORMOTEROL FUMARATE 80-4.5 MCG/ACT IN AERO: 2.0000 | INHALATION_SPRAY | Freq: Two times a day (BID) | RESPIRATORY_TRACT | Status: DC

## 2011-01-04 ENCOUNTER — Emergency Department: Payer: Self-pay | Admitting: *Deleted

## 2011-01-12 ENCOUNTER — Telehealth: Payer: Self-pay | Admitting: Internal Medicine

## 2011-01-12 ENCOUNTER — Ambulatory Visit (INDEPENDENT_AMBULATORY_CARE_PROVIDER_SITE_OTHER): Payer: 59 | Admitting: Internal Medicine

## 2011-01-12 ENCOUNTER — Encounter: Payer: Self-pay | Admitting: Internal Medicine

## 2011-01-12 VITALS — BP 124/72 | HR 81 | Ht 66.0 in | Wt 252.6 lb

## 2011-01-12 DIAGNOSIS — J209 Acute bronchitis, unspecified: Secondary | ICD-10-CM

## 2011-01-12 DIAGNOSIS — J309 Allergic rhinitis, unspecified: Secondary | ICD-10-CM

## 2011-01-12 MED ORDER — PREDNISONE 10 MG PO TABS: ORAL_TABLET | ORAL | Status: DC

## 2011-01-12 MED ORDER — LEVALBUTEROL HCL 0.63 MG/3ML IN NEBU
0.6300 mg | INHALATION_SOLUTION | Freq: Once | RESPIRATORY_TRACT | Status: AC
  Administered 2011-01-12: 0.63 mg via RESPIRATORY_TRACT

## 2011-01-12 MED ORDER — METHYLPREDNISOLONE ACETATE 80 MG/ML IJ SUSP
80.0000 mg | Freq: Once | INTRAMUSCULAR | Status: AC
  Administered 2011-01-12: 80 mg via INTRAMUSCULAR

## 2011-01-12 MED ORDER — DOXYCYCLINE HYCLATE 100 MG PO TABS: ORAL_TABLET | ORAL | Status: DC

## 2011-01-12 NOTE — Patient Instructions (Signed)
Scripts for prednisone and for doxycycline  Depo 80  Neb Xop 0.63

## 2011-01-12 NOTE — Telephone Encounter (Signed)
Increased SOB, productive with thick yellow mucus, wheezing x 3 days. Denies fever, bodyaches, chest tightness. Taking Mucinex DM 1 bid and Tussin cough syrup qhs w/o relief. Please advise, thank you. No Known Allergies Brooke Baker.

## 2011-01-12 NOTE — Progress Notes (Signed)
Patient ID: Brooke Baker, female    DOB: 12/15/1934, 77 y.o.   MRN: [id number]  Asthma Her past medical history is significant for asthma.   07/07/10- 76 yoF never smoker followed for Asthma, allergic rhinitis, complicated by hx CAD, CVA. Last here January 08, 2010 - note reviewed.  She is staying in mostly, in hot weather, but tanned from tanning bed for psoriasis. Has been more aware of dyspnea for months, more with exertion than at rest. She has noted a little wheeze, but main concern is with her heart. Can't sleep supine due to dyspnea, but better on either side. Nocturia x 2. Denies productive cough,  chest pain or palpitation. She has cardiologist at [facility]. PCP is Dr Dale [place], who saw her recently and has ordered blood work, pending.  No recent CXR.  Continues allergy vaccine- feels the shots  have helped her. 1:10 GO.  Using ventolin rescue inhaler 3-4 x recently, symbicort twice daily. Not on prednisone now.    01/12/11- 76 yoF never smoker followed for Asthma, allergic rhinitis, complicated by hx CAD, CVA. Acute visit-having increased SOB, wheezing, cough-productive-yellow/green in color She slipped and fell at home several days ago and has 2 black eyes, on Plavix. She was evaluated with no serious trauma found. Has had flu vaccine. Continues allergy vaccine. For past 3 or 4 days has had increased shortness of breath, wheeze, cough with greenish sputum. Denies fever, sore throat, GI upset. Sleeping in recliner because of postnasal drip. Denies ankle edema.  Review of Systems Constitutional:   No-   weight loss, night sweats, fevers, chills, fatigue, lassitude. HEENT:   No-  headaches, difficulty swallowing, tooth/dental problems, sore throat,       No-  sneezing, itching, ear ache, nasal congestion, + post nasal drip,  CV:  No-   chest pain,  +orthopnea, PND, swelling in lower extremities, anasarca,  dizziness, palpitations Resp: =shortness of breath with exertion  or at rest.              No-   productive cough,  No non-productive cough,  No- coughing up of blood.              No-   change in color of mucus.  + wheezing.   Skin: No-   rash or lesions. GI:  No-   heartburn, indigestion, abdominal pain, nausea, vomiting, diarrhea,                 change in bowel habits, loss of appetite GU: MS:  No-   joint pain or swelling.  No- decreased range of motion.  No- back pain. Neuro-     nothing unusual Psych:  No- change in mood or affect. No depression or anxiety.  No memory loss.   Objective:   Physical Exam General- Alert, Oriented, Affect-appropriate, Distress- none acute, obese Skin- rash-none, lesions- none, excoriation- none Lymphadenopathy- none Head- atraumatic            Eyes- Gross vision intact, PERRLA, conjunctivae clear secretions. Bilateral shiners            Ears- Hearing, canals-normal            Nose- Sniffing, no-Septal dev, mucus, polyps, erosion, perforation             Throat- Mallampati II , mucosa clear , drainage- none, tonsils- atrophic Neck- flexible , trachea midline, no stridor , thyroid nl, carotid no bruit Chest - symmetrical excursion , unlabored  Heart/CV- RRR , no murmur , no gallop  , no rub, nl s1 s2                           - JVD- none , edema- none, stasis changes- none, varices- none           Lung- Cough is dry,wheezy.  , dullness-none, rub- none           Chest wall-  Abd- tender-no, distended-no, bowel sounds-present, HSM- no Br/ Gen/ Rectal- Not done, not indicated Extrem- cyanosis- none, clubbing, none, atrophy- none, strength- nl Neuro- grossly intact to observation

## 2011-01-12 NOTE — Telephone Encounter (Signed)
Per CY-okay to offer Zpak # 1 take as directed no refills. If patient would like to see how this works first then call if no better she may do so, if persistent on appt today then work in at 130pm slot-pt to be here around 115pm.

## 2011-01-12 NOTE — Telephone Encounter (Signed)
I informed pt of CDYs recommendations. Pt states zpak alone does not work for her and agreed to OV today at 1:30pm and is aware to be here at 1:15pm.

## 2011-01-14 NOTE — Assessment & Plan Note (Signed)
Viral pattern exacerbation of URI with bronchitis. Plan nebulizer Xopenex, Depo-Medrol 80, prednisone 8 day taper, doxycycline

## 2011-01-14 NOTE — Assessment & Plan Note (Signed)
Bothersome postnasal drainage now is likely due to viral rhinitis rather than allergy.

## 2011-01-22 ENCOUNTER — Telehealth: Payer: Self-pay | Admitting: Internal Medicine

## 2011-01-22 MED ORDER — PREDNISONE 10 MG PO TABS: ORAL_TABLET | ORAL | Status: DC

## 2011-01-22 NOTE — Telephone Encounter (Signed)
Per CY-okay to give Prednisone 10 m g#20 take 4 x 2 days, 3 x 2 days, 2 x 2 days, 1 x 1 days, then stop no refills.

## 2011-01-22 NOTE — Telephone Encounter (Signed)
Spoke with pt She was last seen 01/12/11- prescribed pred taper and abx Breathing is better, but still wheezing and "rattling"- sleeps in recliner still. Cough still prod but now w/ white sputum.  Pt requests pred taper Please advise, thanks! No Known Allergies

## 2011-01-22 NOTE — Telephone Encounter (Signed)
I spoke with pt and is aware of cdy recs. Rx has been sent to the pharmacy and pt aware of directions. Nothing further was needed

## 2011-02-11 ENCOUNTER — Other Ambulatory Visit: Payer: Self-pay | Admitting: Allergy

## 2011-02-11 MED ORDER — ALBUTEROL SULFATE HFA 108 (90 BASE) MCG/ACT IN AERS: 2.0000 | INHALATION_SPRAY | RESPIRATORY_TRACT | Status: DC | PRN

## 2011-03-02 ENCOUNTER — Telehealth: Payer: Self-pay | Admitting: Internal Medicine

## 2011-03-02 MED ORDER — AZITHROMYCIN 250 MG PO TABS: ORAL_TABLET | ORAL | Status: AC

## 2011-03-02 NOTE — Telephone Encounter (Signed)
Pt c/o prod cough (thick , white) for past 2 days.  Head congestion.  Wheezing and SOB - Pt denies chest tightness or fever.  Please advise. No Known Allergies

## 2011-03-02 NOTE — Telephone Encounter (Signed)
Rx Zapk as directed per Dr Maple Hudson.  Pt notified that rx for zpak was sent to pharmacy.

## 2011-03-04 ENCOUNTER — Ambulatory Visit (INDEPENDENT_AMBULATORY_CARE_PROVIDER_SITE_OTHER): Payer: 59

## 2011-03-04 DIAGNOSIS — J309 Allergic rhinitis, unspecified: Secondary | ICD-10-CM

## 2011-03-19 ENCOUNTER — Other Ambulatory Visit: Payer: Self-pay | Admitting: *Deleted

## 2011-03-19 MED ORDER — BUDESONIDE-FORMOTEROL FUMARATE 80-4.5 MCG/ACT IN AERO: 2.0000 | INHALATION_SPRAY | Freq: Two times a day (BID) | RESPIRATORY_TRACT | Status: DC

## 2011-03-24 ENCOUNTER — Ambulatory Visit: Payer: Self-pay | Admitting: Internal Medicine

## 2011-04-24 ENCOUNTER — Other Ambulatory Visit: Payer: Self-pay | Admitting: Internal Medicine

## 2011-04-24 MED ORDER — ALBUTEROL SULFATE HFA 108 (90 BASE) MCG/ACT IN AERS: 2.0000 | INHALATION_SPRAY | RESPIRATORY_TRACT | Status: DC | PRN

## 2011-05-04 ENCOUNTER — Telehealth: Payer: Self-pay | Admitting: Internal Medicine

## 2011-05-04 MED ORDER — PREDNISONE 10 MG PO TABS: ORAL_TABLET | ORAL | Status: DC

## 2011-05-04 MED ORDER — AZITHROMYCIN 250 MG PO TABS: ORAL_TABLET | ORAL | Status: AC

## 2011-05-04 NOTE — Telephone Encounter (Signed)
I spoke with pt and is aware of CDY recs. rx has been called into the pharmacy and nothing further was needed

## 2011-05-04 NOTE — Telephone Encounter (Signed)
I spoke with pt and she c/o cough w/ brown phlem, wheezing, PND, nasal congestion x couple days. Denies any c/s/n/v/f. Pt has been taking mucinex. She is requesting prednisone and abx called in for this. Please advise Dr. Maple Hudson, thanks'  No Known Allergies   Asher mcadams

## 2011-05-04 NOTE — Telephone Encounter (Signed)
Per CY-okay to give Prednisone 10 mg #20 take 4 x 2 days, 3 x 2 days, 2 x 2 days, 1 x 2 days, then stop no refills and Zpak #1 take as directed no refills.

## 2011-07-13 ENCOUNTER — Ambulatory Visit: Payer: 59 | Admitting: Internal Medicine

## 2011-08-07 ENCOUNTER — Other Ambulatory Visit: Payer: Self-pay | Admitting: Internal Medicine

## 2011-08-07 MED ORDER — ALBUTEROL SULFATE HFA 108 (90 BASE) MCG/ACT IN AERS: 2.0000 | INHALATION_SPRAY | RESPIRATORY_TRACT | Status: DC | PRN

## 2011-09-03 ENCOUNTER — Telehealth: Payer: Self-pay | Admitting: Internal Medicine

## 2011-09-03 NOTE — Telephone Encounter (Signed)
Spoke with patient-she states she is going to go to UC and will let us know if things do not improve.She will contact me for ROV follow up after UC visit as well.

## 2011-09-03 NOTE — Telephone Encounter (Signed)
Pt callede back again. Requests a call back asap because of the time of day. Brooke Baker

## 2011-09-03 NOTE — Telephone Encounter (Signed)
Pt c/o prod cough (yellow), sinus drainage (yellow), wheezing, SOB for 3 days.  Denies fever.  Please advise. Last OV - 01-12-11      Next OV - None scheduled No Known Allergies

## 2011-09-11 ENCOUNTER — Telehealth: Payer: Self-pay | Admitting: Internal Medicine

## 2011-09-11 MED ORDER — ALBUTEROL SULFATE HFA 108 (90 BASE) MCG/ACT IN AERS: 2.0000 | INHALATION_SPRAY | RESPIRATORY_TRACT | Status: DC | PRN

## 2011-09-11 NOTE — Telephone Encounter (Signed)
Called and spoke with the pharmacy and they did receive the refill for the rescue inhaler for this pt. Nothing further is needed.

## 2011-09-29 ENCOUNTER — Ambulatory Visit (INDEPENDENT_AMBULATORY_CARE_PROVIDER_SITE_OTHER): Payer: 59

## 2011-09-29 DIAGNOSIS — J309 Allergic rhinitis, unspecified: Secondary | ICD-10-CM

## 2011-11-10 ENCOUNTER — Telehealth: Payer: Self-pay | Admitting: Internal Medicine

## 2011-11-10 MED ORDER — ALBUTEROL SULFATE HFA 108 (90 BASE) MCG/ACT IN AERS: 2.0000 | INHALATION_SPRAY | RESPIRATORY_TRACT | Status: DC | PRN

## 2011-11-10 NOTE — Telephone Encounter (Signed)
lmomtcb x1 for pt 

## 2011-11-10 NOTE — Telephone Encounter (Signed)
Patient returning call.

## 2011-11-10 NOTE — Telephone Encounter (Signed)
Pt aware she will need to keep appt for 12/29/11 for further refills. She voiced her understanding and needed nothing further. rx has been sent

## 2011-11-20 ENCOUNTER — Other Ambulatory Visit: Payer: Self-pay | Admitting: *Deleted

## 2011-11-20 MED ORDER — BUDESONIDE-FORMOTEROL FUMARATE 80-4.5 MCG/ACT IN AERO: 2.0000 | INHALATION_SPRAY | Freq: Two times a day (BID) | RESPIRATORY_TRACT | Status: DC

## 2011-12-16 ENCOUNTER — Encounter: Payer: Self-pay | Admitting: *Deleted

## 2011-12-16 DIAGNOSIS — I10 Essential (primary) hypertension: Secondary | ICD-10-CM

## 2011-12-16 DIAGNOSIS — G40909 Epilepsy, unspecified, not intractable, without status epilepticus: Secondary | ICD-10-CM

## 2011-12-16 DIAGNOSIS — G8929 Other chronic pain: Secondary | ICD-10-CM

## 2011-12-16 DIAGNOSIS — E119 Type 2 diabetes mellitus without complications: Secondary | ICD-10-CM

## 2011-12-16 DIAGNOSIS — I4891 Unspecified atrial fibrillation: Secondary | ICD-10-CM

## 2011-12-16 DIAGNOSIS — E7800 Pure hypercholesterolemia, unspecified: Secondary | ICD-10-CM

## 2011-12-18 ENCOUNTER — Encounter: Payer: Self-pay | Admitting: Internal Medicine

## 2011-12-18 ENCOUNTER — Encounter: Payer: Self-pay | Admitting: *Deleted

## 2011-12-18 ENCOUNTER — Ambulatory Visit (INDEPENDENT_AMBULATORY_CARE_PROVIDER_SITE_OTHER): Payer: 59 | Admitting: Internal Medicine

## 2011-12-18 VITALS — BP 132/80 | HR 95 | Temp 98.0°F | Ht 66.5 in | Wt 252.5 lb

## 2011-12-18 DIAGNOSIS — I2510 Atherosclerotic heart disease of native coronary artery without angina pectoris: Secondary | ICD-10-CM

## 2011-12-18 DIAGNOSIS — I4891 Unspecified atrial fibrillation: Secondary | ICD-10-CM | POA: Insufficient documentation

## 2011-12-18 DIAGNOSIS — G629 Polyneuropathy, unspecified: Secondary | ICD-10-CM

## 2011-12-18 DIAGNOSIS — J209 Acute bronchitis, unspecified: Secondary | ICD-10-CM

## 2011-12-18 DIAGNOSIS — I10 Essential (primary) hypertension: Secondary | ICD-10-CM

## 2011-12-18 DIAGNOSIS — Z139 Encounter for screening, unspecified: Secondary | ICD-10-CM

## 2011-12-18 DIAGNOSIS — G40909 Epilepsy, unspecified, not intractable, without status epilepticus: Secondary | ICD-10-CM

## 2011-12-18 DIAGNOSIS — I480 Paroxysmal atrial fibrillation: Secondary | ICD-10-CM | POA: Insufficient documentation

## 2011-12-18 DIAGNOSIS — J45909 Unspecified asthma, uncomplicated: Secondary | ICD-10-CM

## 2011-12-18 DIAGNOSIS — M549 Dorsalgia, unspecified: Secondary | ICD-10-CM

## 2011-12-18 DIAGNOSIS — G589 Mononeuropathy, unspecified: Secondary | ICD-10-CM

## 2011-12-18 DIAGNOSIS — E7800 Pure hypercholesterolemia, unspecified: Secondary | ICD-10-CM

## 2011-12-18 DIAGNOSIS — E119 Type 2 diabetes mellitus without complications: Secondary | ICD-10-CM | POA: Insufficient documentation

## 2011-12-18 DIAGNOSIS — I6350 Cerebral infarction due to unspecified occlusion or stenosis of unspecified cerebral artery: Secondary | ICD-10-CM

## 2011-12-18 DIAGNOSIS — G8929 Other chronic pain: Secondary | ICD-10-CM | POA: Insufficient documentation

## 2011-12-18 DIAGNOSIS — E785 Hyperlipidemia, unspecified: Secondary | ICD-10-CM | POA: Insufficient documentation

## 2011-12-18 LAB — HEPATIC FUNCTION PANEL
ALT: 19 U/L (ref 0–35)
AST: 31 U/L (ref 0–37)
Bilirubin, Direct: 0.2 mg/dL (ref 0.0–0.3)
Total Bilirubin: 0.9 mg/dL (ref 0.3–1.2)
Total Protein: 7.8 g/dL (ref 6.0–8.3)

## 2011-12-18 LAB — LIPID PANEL
Cholesterol: 131 mg/dL (ref 0–200)
VLDL: 16.2 mg/dL (ref 0.0–40.0)

## 2011-12-18 LAB — BASIC METABOLIC PANEL
BUN: 18 mg/dL (ref 6–23)
Calcium: 8.7 mg/dL (ref 8.4–10.5)
GFR: 63.97 mL/min (ref >60.00)
Potassium: 4.1 mEq/L (ref 3.5–5.1)
Sodium: 137 mEq/L (ref 135–145)

## 2011-12-20 ENCOUNTER — Encounter: Payer: Self-pay | Admitting: Internal Medicine

## 2011-12-20 NOTE — Progress Notes (Signed)
Subjective:    Patient ID: Brooke Baker, female    DOB: 12-Feb-1934, 77 y.o.   MRN: [id number]  HPI 77 year old female with past history of COPD/asthma, hypertension, previous CVA, seizure disorder and known CAD.  She comes in today for a scheduled follow up.  Recently was seen at Urgent Care.  Treated with antibiotics, prednisone and nebulizer treatments.  Breathing back to baseline now.  Due to see her pulmonologist (Dr Maple Hudson) next month (1/14).  Using her Symbicort bid.  Due to follow up with Dr Darrold Junker next month as well.  States she noticed a little racing feeling on one occasion - at night.  Not a persistent issues.  Overall she feels her heart is doing relatively well.  No acid reflux.  No nausea or vomiting.  Bowels stable.  Still with the chronic back pain.   Past Medical History  Diagnosis Date  . Hypertension   . Acute bronchitis   . CVA (cerebral vascular accident)   . CAD (coronary artery disease)     s/p stent LAD 8/03 and stent placement OM1  . Allergic rhinitis   . COPD with asthma   . Chronic back pain   . Seizure disorder   . Hypercholesterolemia   . Diabetes mellitus     Current Outpatient Prescriptions on File Prior to Visit  Medication Sig Dispense Refill  . albuterol (VENTOLIN HFA) 108 (90 BASE) MCG/ACT inhaler Inhale 2 puffs into the lungs every 4 (four) hours as needed for wheezing.  1 Inhaler  1  . amLODipine (NORVASC) 10 MG tablet Take 10 mg by mouth daily.        [name] aspirin 81 MG tablet Take 81 mg by mouth daily.        . budesonide-formoterol (SYMBICORT) 80-4.5 MCG/ACT inhaler Inhale 2 puffs into the lungs 2 (two) times daily.  1 Inhaler  2  . dextromethorphan-guaiFENesin (MUCINEX DM) 30-600 MG per 12 hr tablet Take 1 tablet by mouth every 12 (twelve) hours.        [name] doxycycline (VIBRA-TABS) 100 MG tablet 2 today then one daily  8 tablet  0  . EPINEPHrine (EPIPEN) 0.3 mg/0.3 mL DEVI Inject 0.3 mg into the muscle once.        . levETIRAcetam (KEPPRA) 750  MG tablet Take 750 mg by mouth 2 (two) times daily.        [name] losartan (COZAAR) 100 MG tablet Take 100 mg by mouth daily.        . metoprolol (TOPROL-XL) 50 MG 24 hr tablet Take 50 mg by mouth daily.        . Omega-3 Fatty Acids (FISH OIL) 1000 MG CAPS Take 1 capsule by mouth 2 (two) times daily.        . phenytoin (DILANTIN) 100 MG ER capsule Take 400 mg by mouth at bedtime.        . rosuvastatin (CRESTOR) 20 MG tablet Take 20 mg by mouth daily.        . sertraline (ZOLOFT) 50 MG tablet Take 50 mg by mouth daily.          Review of Systems Patient denies any headache, lightheadedness or dizziness.  No significant sinus or allergy symptoms.   No chest pain, tightness or palpitations.  No increased shortness of breath, cough or congestion. Breathing stable now.   No nausea or vomiting.  No abdominal pain or cramping.  No bowel change, such as diarrhea, constipation, BRBPR or melana.  No  urine change.        Objective:   Physical Exam Filed Vitals:   12/18/11 1011  BP: 132/80  Pulse: 95  Temp: 98 F (54.41 C)   77 year old female in no acute distress.   HEENT:  Nares - clear.  OP- without lesions or erythema.  NECK:  Supple, nontender.  No audible bruit.   HEART:  Appears to be regular. LUNGS:  Without crackles or wheezing audible.  Respirations even and unlabored.   RADIAL PULSE:  Equal bilaterally.  ABDOMEN:  Soft, nontender.  No audible abdominal bruit.   EXTREMITIES:  No increased edema to be present.                   Assessment & Plan:  HEALTH MAINTENANCE.  Physical 03/19/11.  Hemoccult cards 04/01/11 - negative.  Mammogram 03/24/11 - BiRADS II.  Bone density 01/10/07 revealed improvement.  Continue calcium and vitamin D.  Agreed to colonoscopy today.  Has previously declined.  Referral to GI for screening colonoscopy.  Overdue.

## 2011-12-20 NOTE — Assessment & Plan Note (Signed)
On Pradaxa.  Rate controlled. She feels symptoms are stable.  Follow.  Keep follow up appt with Dr Darrold Junker next month.

## 2011-12-20 NOTE — Assessment & Plan Note (Signed)
Known disease. Continue risk factor modification.  Currently asymptomatic.    

## 2011-12-20 NOTE — Assessment & Plan Note (Signed)
Breathing back to baseline now.  Same meds.  Continue follow up with Dr Maple Hudson.

## 2011-12-20 NOTE — Assessment & Plan Note (Signed)
On dilantin.  Followed by neurology.  Currently seizure free.  Follow.

## 2011-12-20 NOTE — Assessment & Plan Note (Signed)
No reoccurring symptoms.  Doing well.  Continue current med regimen.   

## 2011-12-20 NOTE — Assessment & Plan Note (Signed)
Recent acute infection resolved.  Follow.

## 2011-12-20 NOTE — Assessment & Plan Note (Signed)
Low carb diet and weight loss.  Check met b and a1c.

## 2011-12-20 NOTE — Assessment & Plan Note (Signed)
On crestor.  Low cholesterol diet.  Check lipid panel and liver function.   

## 2011-12-20 NOTE — Assessment & Plan Note (Signed)
Blood pressure under good control.  Same meds.  Check metabolic panel.  

## 2011-12-20 NOTE — Assessment & Plan Note (Signed)
Has had multiple back surgeries.  Chronic pain.  Follow.   

## 2011-12-29 ENCOUNTER — Ambulatory Visit: Payer: 59 | Admitting: Internal Medicine

## 2012-01-04 ENCOUNTER — Ambulatory Visit (INDEPENDENT_AMBULATORY_CARE_PROVIDER_SITE_OTHER): Payer: 59 | Admitting: Internal Medicine

## 2012-01-04 ENCOUNTER — Encounter: Payer: Self-pay | Admitting: Internal Medicine

## 2012-01-04 VITALS — BP 112/84 | HR 98 | Ht 66.0 in | Wt 257.0 lb

## 2012-01-04 DIAGNOSIS — J45998 Other asthma: Secondary | ICD-10-CM

## 2012-01-04 DIAGNOSIS — J3089 Other allergic rhinitis: Secondary | ICD-10-CM

## 2012-01-04 DIAGNOSIS — J309 Allergic rhinitis, unspecified: Secondary | ICD-10-CM

## 2012-01-04 DIAGNOSIS — J45909 Unspecified asthma, uncomplicated: Secondary | ICD-10-CM

## 2012-01-04 DIAGNOSIS — J302 Other seasonal allergic rhinitis: Secondary | ICD-10-CM

## 2012-01-04 MED ORDER — BUDESONIDE-FORMOTEROL FUMARATE 160-4.5 MCG/ACT IN AERO: 2.0000 | INHALATION_SPRAY | Freq: Two times a day (BID) | RESPIRATORY_TRACT | Status: DC

## 2012-01-04 MED ORDER — EPINEPHRINE 0.3 MG/0.3ML IJ DEVI: 0.3000 mg | Freq: Once | INTRAMUSCULAR | Status: DC

## 2012-01-04 NOTE — Patient Instructions (Addendum)
We can continue allergy vaccine 1:10  Script to refill Epipen  Sample and script to change Symbicort to 160 strength     2 puffs then rinse mouth well, twice daily

## 2012-01-04 NOTE — Progress Notes (Signed)
Patient ID: Brooke Baker, female    DOB: [birth year], 77 y.o.   MRN: [id number]  Asthma Her past medical history is significant for asthma.   07/07/10- 76 yoF never smoker followed for Asthma, allergic rhinitis, complicated by hx CAD, CVA. Last here January 08, 2010 - note reviewed.  She is staying in mostly, in hot weather, but tanned from tanning bed for psoriasis. Has been more aware of dyspnea for months, more with exertion than at rest. She has noted a little wheeze, but main concern is with her heart. Can't sleep supine due to dyspnea, but better on either side. Nocturia x 2. Denies productive cough,  chest pain or palpitation. She has cardiologist at [facility]. PCP is Dr Dale Muddy, who saw her recently and has ordered blood work, pending.  No recent CXR.  Continues allergy vaccine- feels the shots  have helped her. 1:10 GO.  Using ventolin rescue inhaler 3-4 x recently, symbicort twice daily. Not on prednisone now.    01/12/11- 76 yoF never smoker followed for Asthma, allergic rhinitis, complicated by hx CAD, CVA. Acute visit-having increased SOB, wheezing, cough-productive-yellow/green in color She slipped and fell at home several days ago and has 2 black eyes, on Plavix. She was evaluated with no serious trauma found. Has had flu vaccine. Continues allergy vaccine. For past 3 or 4 days has had increased shortness of breath, wheeze, cough with greenish sputum. Denies fever, sore throat, GI upset. Sleeping in recliner because of postnasal drip. Denies ankle edema.  01/04/12- 77 yoF never smoker followed for Asthma, allergic rhinitis, complicated by hx CAD, CVA. FOLLOWS FOR: doing well since last visit; no longer having the wheezing. Was treated at a walk in clinic during the fall season, for bronchitis. Occasional episode of cough or wheeze. Now on Pradaxa for AFib Continues allergy vaccine 1:10. Family gives w/o problem.   Review of Systems-see HPI Constitutional:   No-    weight loss, night sweats, fevers, chills, fatigue, lassitude. HEENT:   No-  headaches, difficulty swallowing, tooth/dental problems, sore throat,       No-  sneezing, itching, ear ache, nasal congestion, + post nasal drip,  CV:  No-   chest pain,  +orthopnea, PND, swelling in lower extremities, anasarca,  dizziness, palpitations Resp: +shortness of breath with exertion or at rest.              No-   productive cough,  + non-productive cough,  No- coughing up of blood.              No-   change in color of mucus.  + wheezing.   Skin: No-   rash or lesions. GI:  No-   heartburn, indigestion, abdominal pain, nausea, vomiting,  GU: MS:  No-   joint pain or swelling.  . Neuro-     nothing unusual Psych:  No- change in mood or affect. No depression or anxiety.  No memory loss.   Objective:   Physical Exam General- Alert, Oriented, Affect-appropriate, Distress- none acute, obese Skin- rash-none, lesions- none, excoriation- none Lymphadenopathy- none Head- atraumatic            Eyes- Gross vision intact, PERRLA, conjunctivae clear secretions. Bilateral shiners            Ears- Hearing, canals-normal            Nose- Sniffing, no-Septal dev, mucus, polyps, erosion, perforation  Throat- Mallampati II , mucosa clear , drainage- none, tonsils- atrophic Neck- flexible , trachea midline, no stridor , thyroid nl, carotid no bruit Chest - symmetrical excursion , unlabored           Heart/CV-+ IRR , no murmur , no gallop  , no rub, nl s1 s2                           - JVD- none , edema- none, stasis changes- none, varices- none           Lung-   Clear, dullness-none, rub- none           Chest wall-  Abd-  Br/ Gen/ Rectal- Not done, not indicated Extrem- cyanosis- none, clubbing, none, atrophy- none, strength- nl Neuro- grossly intact to observation

## 2012-01-15 NOTE — Assessment & Plan Note (Signed)
Marginal control. Plan-add Symbicort 160 with discussion

## 2012-01-15 NOTE — Assessment & Plan Note (Signed)
We discussed risks benefits, goals, duration of therapy and risks of vaccine therapy. She is satisfied that it helps her and comfortable continuing. Discussed and refilled Epipen

## 2012-02-02 ENCOUNTER — Other Ambulatory Visit: Payer: Self-pay | Admitting: *Deleted

## 2012-02-04 MED ORDER — ROSUVASTATIN CALCIUM 20 MG PO TABS: 20.0000 mg | ORAL_TABLET | Freq: Every day | ORAL | Status: DC

## 2012-02-04 NOTE — Telephone Encounter (Signed)
Sent in to pharmacy.  

## 2012-02-15 ENCOUNTER — Other Ambulatory Visit: Payer: Self-pay | Admitting: *Deleted

## 2012-02-15 MED ORDER — HYDROCHLOROTHIAZIDE 25 MG PO TABS: 25.0000 mg | ORAL_TABLET | Freq: Every day | ORAL | Status: DC

## 2012-02-29 ENCOUNTER — Other Ambulatory Visit: Payer: Self-pay | Admitting: *Deleted

## 2012-03-01 MED ORDER — METOPROLOL SUCCINATE ER 50 MG PO TB24: 50.0000 mg | ORAL_TABLET | Freq: Every day | ORAL | Status: DC

## 2012-03-01 NOTE — Telephone Encounter (Signed)
Sent in to pharmacy.  

## 2012-03-08 ENCOUNTER — Other Ambulatory Visit: Payer: Self-pay | Admitting: Internal Medicine

## 2012-03-08 MED ORDER — BUDESONIDE-FORMOTEROL FUMARATE 160-4.5 MCG/ACT IN AERO: 2.0000 | INHALATION_SPRAY | Freq: Two times a day (BID) | RESPIRATORY_TRACT | Status: DC

## 2012-03-08 NOTE — Telephone Encounter (Signed)
.  per last oV Sample and script to change Symbicort to 160 strength 2 puffs then rinse mouth well, twice daily [facility] [employer] requesting rx for  symbicort prescription sent to pharmacy . Nothing further needed.

## 2012-03-22 ENCOUNTER — Other Ambulatory Visit: Payer: Self-pay | Admitting: *Deleted

## 2012-03-22 MED ORDER — ALBUTEROL SULFATE HFA 108 (90 BASE) MCG/ACT IN AERS: 2.0000 | INHALATION_SPRAY | RESPIRATORY_TRACT | Status: DC | PRN

## 2012-03-22 NOTE — Telephone Encounter (Signed)
Per CY okay to refill with prn refills.

## 2012-03-24 ENCOUNTER — Encounter: Payer: Self-pay | Admitting: Internal Medicine

## 2012-03-24 ENCOUNTER — Ambulatory Visit (INDEPENDENT_AMBULATORY_CARE_PROVIDER_SITE_OTHER): Payer: 59 | Admitting: Internal Medicine

## 2012-03-24 VITALS — BP 120/70 | HR 88 | Temp 97.7°F | Ht 66.5 in | Wt 255.0 lb

## 2012-03-24 DIAGNOSIS — E7800 Pure hypercholesterolemia, unspecified: Secondary | ICD-10-CM

## 2012-03-24 DIAGNOSIS — G8929 Other chronic pain: Secondary | ICD-10-CM

## 2012-03-24 DIAGNOSIS — Z1239 Encounter for other screening for malignant neoplasm of breast: Secondary | ICD-10-CM

## 2012-03-24 DIAGNOSIS — I10 Essential (primary) hypertension: Secondary | ICD-10-CM

## 2012-03-24 DIAGNOSIS — J45909 Unspecified asthma, uncomplicated: Secondary | ICD-10-CM

## 2012-03-24 DIAGNOSIS — M549 Dorsalgia, unspecified: Secondary | ICD-10-CM

## 2012-03-24 DIAGNOSIS — I4891 Unspecified atrial fibrillation: Secondary | ICD-10-CM

## 2012-03-24 DIAGNOSIS — E119 Type 2 diabetes mellitus without complications: Secondary | ICD-10-CM

## 2012-03-24 DIAGNOSIS — I2510 Atherosclerotic heart disease of native coronary artery without angina pectoris: Secondary | ICD-10-CM

## 2012-03-24 DIAGNOSIS — I6350 Cerebral infarction due to unspecified occlusion or stenosis of unspecified cerebral artery: Secondary | ICD-10-CM

## 2012-03-24 DIAGNOSIS — G40909 Epilepsy, unspecified, not intractable, without status epilepticus: Secondary | ICD-10-CM

## 2012-03-24 DIAGNOSIS — J45998 Other asthma: Secondary | ICD-10-CM

## 2012-03-27 ENCOUNTER — Encounter: Payer: Self-pay | Admitting: Internal Medicine

## 2012-03-27 NOTE — Assessment & Plan Note (Signed)
On crestor.  Low cholesterol diet.  Check lipid panel and liver function.   

## 2012-03-27 NOTE — Assessment & Plan Note (Signed)
On Pradaxa.  Rate controlled. She feels symptoms are stable.  Follow.  Keep follow up appt with Dr Paraschos.   

## 2012-03-27 NOTE — Assessment & Plan Note (Signed)
Has had multiple back surgeries.  Chronic pain.  Follow.   

## 2012-03-27 NOTE — Assessment & Plan Note (Signed)
Known disease. Continue risk factor modification.  Currently asymptomatic.    

## 2012-03-27 NOTE — Assessment & Plan Note (Signed)
Blood pressure under good control.  Same meds.  Check metabolic panel.  

## 2012-03-27 NOTE — Assessment & Plan Note (Signed)
Breathing stable.  Follow.    

## 2012-03-27 NOTE — Progress Notes (Signed)
Subjective:    Patient ID: Brooke Baker, female    DOB: 1934-04-07, 78 y.o.   MRN: [id number]  HPI 78 year old female with past history of COPD/asthma, hypertension, previous CVA, seizure disorder and known CAD.  She comes in today to follow up on these issues as well as for a complete physical exam.  Breathing is stable.  Using her Symbicort bid.  Still seeing Dr Maple Hudson.  Back is still an issue and limits her from standing for a long period of time.  She feels is stable.  Seeing Dr Darrold Junker.  Feels her heart is stable.  No nausea or vomiting.  No bowel change.     Past Medical History  Diagnosis Date  . Hypertension   . Acute bronchitis   . CVA (cerebral vascular accident)   . CAD (coronary artery disease)     s/p stent LAD 8/03 and stent placement OM1  . Allergic rhinitis   . COPD with asthma   . Chronic back pain   . Seizure disorder   . Hypercholesterolemia   . Diabetes mellitus     Current Outpatient Prescriptions on File Prior to Visit  Medication Sig Dispense Refill  . albuterol (VENTOLIN HFA) 108 (90 BASE) MCG/ACT inhaler Inhale 2 puffs into the lungs every 4 (four) hours as needed for wheezing.  1 Inhaler  11  . amLODipine (NORVASC) 10 MG tablet Take 10 mg by mouth daily.        [name] aspirin 81 MG tablet Take 81 mg by mouth daily.        . budesonide-formoterol (SYMBICORT) 160-4.5 MCG/ACT inhaler Inhale 2 puffs into the lungs 2 (two) times daily.  1 Inhaler  0  . budesonide-formoterol (SYMBICORT) 160-4.5 MCG/ACT inhaler Inhale 2 puffs into the lungs 2 (two) times daily. Rinse mouth  1 Inhaler  prn  . dextromethorphan-guaiFENesin (MUCINEX DM) 30-600 MG per 12 hr tablet Take 1 tablet by mouth every 12 (twelve) hours.        [name] EPINEPHrine (EPIPEN) 0.3 mg/0.3 mL DEVI Inject 0.3 mLs (0.3 mg total) into the muscle once.  1 Device  prn  . hydrochlorothiazide (HYDRODIURIL) 25 MG tablet Take 1 tablet (25 mg total) by mouth daily.  30 tablet  6  . levETIRAcetam (KEPPRA) 750 MG tablet  Take 750 mg by mouth 2 (two) times daily.        [name] losartan (COZAAR) 100 MG tablet Take 100 mg by mouth daily.        . metoprolol succinate (TOPROL-XL) 50 MG 24 hr tablet Take 1 tablet (50 mg total) by mouth daily.  30 tablet  5  . Omega-3 Fatty Acids (FISH OIL) 1000 MG CAPS Take 1 capsule by mouth 2 (two) times daily.        . phenytoin (DILANTIN) 100 MG ER capsule Take 400 mg by mouth 4 (four) times daily.       . rosuvastatin (CRESTOR) 20 MG tablet Take 1 tablet (20 mg total) by mouth daily.  30 tablet  5  . sertraline (ZOLOFT) 50 MG tablet Take 50 mg by mouth daily.        . dabigatran (PRADAXA) 150 MG CAPS Take 150 mg by mouth every 12 (twelve) hours.       No current facility-administered medications on file prior to visit.    Review of Systems Patient denies any headache, lightheadedness or dizziness.  No significant sinus or allergy symptoms.   No chest pain, tightness or palpitations.  No increased shortness of breath, cough or congestion. Breathing stable now.  No acid reflux.  No nausea or vomiting.  No abdominal pain or cramping.  No bowel change, such as diarrhea, constipation, BRBPR or melana.  No urine change.        Objective:   Physical Exam  Filed Vitals:   03/24/12 1547  BP: 120/70  Pulse: 88  Temp: 97.7 F (36.5 C)   Blood pressure recheck:  118-120/68, pulse 69  78 year old female in no acute distress.   HEENT:  Nares- clear.  Oropharynx - without lesions. NECK:  Supple.  Nontender.  No audible bruit.  HEART:  Rate controlled.   LUNGS:  No crackles or wheezing audible.  Respirations even and unlabored.  RADIAL PULSE:  Equal bilaterally.    BREASTS:  No nipple discharge or nipple retraction present.  Could not appreciate any distinct nodules or axillary adenopathy.  ABDOMEN:  Soft, nontender.  Bowel sounds present and normal.  No audible abdominal bruit.  GU:  Normal external genitalia.  Vaginal vault without lesions.  S/p hysterectomy.  Could not appreciate  any adnexal masses or tenderness.   RECTAL:  Heme negative.   EXTREMITIES:  No increased edema present.  DP pulses palpable and equal bilaterally.           Assessment & Plan:  HEALTH MAINTENANCE.  Physical today.  Mammogram 03/24/11 - BiRADS II.  Schedule a follow up mammogram.  Bone density 01/10/07 revealed improvement. Continue calcium and vitamin D.  planning for colonoscopy in the near future.

## 2012-03-27 NOTE — Assessment & Plan Note (Signed)
No reoccurring symptoms.  Doing well.  Continue current med regimen.   

## 2012-03-27 NOTE — Assessment & Plan Note (Signed)
Low carb diet and weight loss.  Check met b and a1c.

## 2012-03-27 NOTE — Assessment & Plan Note (Signed)
On dilantin and Keppra.  Followed by neurology.  Currently seizure free.  Follow.   

## 2012-04-06 ENCOUNTER — Telehealth: Payer: Self-pay | Admitting: *Deleted

## 2012-04-06 NOTE — Telephone Encounter (Signed)
Called patient with results, left message for patient to return call. Results in yellow folder on my desk.

## 2012-04-07 ENCOUNTER — Ambulatory Visit: Payer: Self-pay | Admitting: Internal Medicine

## 2012-04-13 ENCOUNTER — Other Ambulatory Visit: Payer: Self-pay | Admitting: *Deleted

## 2012-04-14 MED ORDER — AMLODIPINE BESYLATE 10 MG PO TABS: 10.0000 mg | ORAL_TABLET | Freq: Every day | ORAL | Status: DC

## 2012-04-14 MED ORDER — LOSARTAN POTASSIUM 100 MG PO TABS: 100.0000 mg | ORAL_TABLET | Freq: Every day | ORAL | Status: DC

## 2012-04-14 NOTE — Telephone Encounter (Signed)
Med filled.  

## 2012-04-18 ENCOUNTER — Telehealth: Payer: Self-pay

## 2012-04-18 NOTE — Telephone Encounter (Signed)
Patient notified that her cholesterol looks good overall sugar control is ok liver function and kidney function normal platelets slightly decreased. I set her up for appointment for Monday 04/25 labs

## 2012-04-19 ENCOUNTER — Encounter: Payer: Self-pay | Admitting: Internal Medicine

## 2012-04-25 ENCOUNTER — Encounter: Payer: Self-pay | Admitting: Adult Health

## 2012-04-25 ENCOUNTER — Other Ambulatory Visit: Payer: 59

## 2012-04-25 ENCOUNTER — Ambulatory Visit (INDEPENDENT_AMBULATORY_CARE_PROVIDER_SITE_OTHER): Payer: 59 | Admitting: Adult Health

## 2012-04-25 ENCOUNTER — Telehealth: Payer: Self-pay | Admitting: Internal Medicine

## 2012-04-25 VITALS — BP 124/66 | HR 113 | Temp 99.6°F | Ht 66.0 in | Wt 255.4 lb

## 2012-04-25 DIAGNOSIS — J209 Acute bronchitis, unspecified: Secondary | ICD-10-CM

## 2012-04-25 MED ORDER — AZITHROMYCIN 250 MG PO TABS: ORAL_TABLET | ORAL | Status: AC

## 2012-04-25 MED ORDER — PREDNISONE 10 MG PO TABS: ORAL_TABLET | ORAL | Status: DC

## 2012-04-25 MED ORDER — METHYLPREDNISOLONE ACETATE 80 MG/ML IJ SUSP
80.0000 mg | Freq: Once | INTRAMUSCULAR | Status: AC
  Administered 2012-04-25: 80 mg via INTRAMUSCULAR

## 2012-04-25 NOTE — Telephone Encounter (Signed)
Called, spoke with pt.  C/o increased SOB at rest and with activity, wheezing, ratting in chest, cough, spitting up yellow mucus, and blowing BRB from nose.  Symptoms started on Friday but worsened yesterday.  Denies PND, sinus pressure, HA, f/c/s.  Requesting OV today.  Pt fine with seeing TP -- OV scheduled today at 4:30 pm.  Pt aware.

## 2012-04-25 NOTE — Patient Instructions (Addendum)
Zpack take as directed  Mucinex DM Twice daily  As needed  Cough/congestion.  Fluids and rest  Prednisone taper over next week.  Please contact office for sooner follow up if symptoms do not improve or worsen or seek emergency care  follow up Dr. Maple Hudson  6-8 weeks and As needed

## 2012-04-25 NOTE — Progress Notes (Signed)
Patient ID: Brooke Baker, female    DOB: Jan 08, 1934, 78 y.o.   MRN: [id number]  Asthma Her past medical history is significant for asthma.   07/07/10- 76 yoF never smoker followed for Asthma, allergic rhinitis, complicated by hx CAD, CVA. Last here January 08, 2010 - note reviewed.  She is staying in mostly, in hot weather, but tanned from tanning bed for psoriasis. Has been more aware of dyspnea for months, more with exertion than at rest. She has noted a little wheeze, but main concern is with her heart. Can't sleep supine due to dyspnea, but better on either side. Nocturia x 2. Denies productive cough,  chest pain or palpitation. She has cardiologist at [facility]. PCP is Dr Dale [place], who saw her recently and has ordered blood work, pending.  No recent CXR.  Continues allergy vaccine- feels the shots  have helped her. 1:10 GO.  Using ventolin rescue inhaler 3-4 x recently, symbicort twice daily. Not on prednisone now.    01/12/11- 76 yoF never smoker followed for Asthma, allergic rhinitis, complicated by hx CAD, CVA. Acute visit-having increased SOB, wheezing, cough-productive-yellow/green in color She slipped and fell at home several days ago and has 2 black eyes, on Plavix. She was evaluated with no serious trauma found. Has had flu vaccine. Continues allergy vaccine. For past 3 or 4 days has had increased shortness of breath, wheeze, cough with greenish sputum. Denies fever, sore throat, GI upset. Sleeping in recliner because of postnasal drip. Denies ankle edema.  01/04/12- 77 yoF never smoker followed for Asthma, allergic rhinitis, complicated by hx CAD, CVA. FOLLOWS FOR: doing well since last visit; no longer having the wheezing. Was treated at a walk in clinic during the fall season, for bronchitis. Occasional episode of cough or wheeze. Now on Pradaxa for AFib Continues allergy vaccine 1:10. Family gives w/o problem.  04/25/12 Acute OV  Complains of increased SOB,  wheezing, prod cough with brown/yellow mucus x3days - No fever or chest pain. Appetite is good. No n/v.  No otc used.  No recent travel or abx use.  Cough is getting worse. More wheezing this am.     Review of Systems-see HPI Constitutional:   No-   weight loss, night sweats, fevers, chills, fatigue, lassitude. HEENT:   No-  headaches, difficulty swallowing, tooth/dental problems, sore throat,       No-  sneezing, itching, ear ache, nasal congestion, + post nasal drip,  CV:  No-   chest pain,  +orthopnea, PND, swelling in lower extremities, anasarca,  dizziness, palpitations Resp: +shortness of breath with exertion or at rest.              No-   productive cough,  + non-productive cough,  No- coughing up of blood.              No-   change in color of mucus.  + wheezing.   Skin: No-   rash or lesions. GI:  No-   heartburn, indigestion, abdominal pain, nausea, vomiting,  GU: MS:  No-   joint pain or swelling.  . Neuro-     nothing unusual Psych:  No- change in mood or affect. No depression or anxiety.  No memory loss.   Objective:   Physical Exam General- Alert, Oriented, Affect-appropriate, Distress- none acute, obese Skin- rash-none, lesions- none, excoriation- none Lymphadenopathy- none Head- atraumatic            Eyes- Gross vision intact, PERRLA, conjunctivae  clear secretions. Bilateral shiners            Ears- Hearing, canals-normal            Nose- Sniffing, no-Septal dev, mucus, polyps, erosion, perforation             Throat- Mallampati II , mucosa clear , drainage- none, tonsils- atrophic Neck- flexible , trachea midline, no stridor , thyroid nl, carotid no bruit Chest - symmetrical excursion , unlabored           Heart/CV-+ IRR , no murmur , no gallop  , no rub, nl s1 s2                           - JVD- none , edema- none, stasis changes- none, varices- none           Lung-    Few exp wheezes            Chest wall-  Abd-  Br/ Gen/ Rectal- Not done, not  indicated Extrem- cyanosis- none, clubbing, none, atrophy- none, strength- nl Neuro- grossly intact to observation

## 2012-04-26 ENCOUNTER — Telehealth: Payer: Self-pay | Admitting: Adult Health

## 2012-04-26 MED ORDER — ALBUTEROL SULFATE (2.5 MG/3ML) 0.083% IN NEBU: 2.5000 mg | INHALATION_SOLUTION | RESPIRATORY_TRACT | Status: DC | PRN

## 2012-04-26 NOTE — Assessment & Plan Note (Signed)
Flare w/ URI   Plan  Zpack take as directed  Mucinex DM Twice daily  As needed  Cough/congestion.  Fluids and rest  Prednisone taper over next week.  Please contact office for sooner follow up if symptoms do not improve or worsen or seek emergency care  follow up Dr. Maple Hudson  6-8 weeks and As needed

## 2012-04-26 NOTE — Telephone Encounter (Signed)
Spoke to pt. Albuterol neb medication is not on her medication list. She states that she went to a walk-in clinic and was given this medication. TP told her yesterday that we would refill this for her but she wasn't given a rx.  Per TP - okay to refill, use every 4 hours prn.  Rx has been sent in, pt is aware.

## 2012-04-27 ENCOUNTER — Other Ambulatory Visit: Payer: Self-pay | Admitting: *Deleted

## 2012-04-27 ENCOUNTER — Encounter: Payer: Self-pay | Admitting: Internal Medicine

## 2012-04-27 MED ORDER — SERTRALINE HCL 50 MG PO TABS: 50.0000 mg | ORAL_TABLET | Freq: Every day | ORAL | Status: DC

## 2012-05-05 ENCOUNTER — Other Ambulatory Visit: Payer: Self-pay | Admitting: Internal Medicine

## 2012-05-05 ENCOUNTER — Telehealth: Payer: Self-pay | Admitting: Internal Medicine

## 2012-05-05 MED ORDER — PREDNISONE 10 MG PO TABS: ORAL_TABLET | ORAL | Status: DC

## 2012-05-05 NOTE — Telephone Encounter (Signed)
I spoke with pt. She stated she is still coughing some but is now only getting up clear phlem and is slightly wheezing (so that is getting better). Denies any chest tx and no increase SOB. She saw TP on 04/25/12. Has pending appt 07/04/12. Pt is requesting prednisone. Please advise Dr. Maple Hudson thanks  No Known Allergies

## 2012-05-05 NOTE — Telephone Encounter (Signed)
Pt aware rx sent.  

## 2012-05-05 NOTE — Telephone Encounter (Signed)
Per CY-okay to give Prednisone 10mg #20 take 4 x 2 days, 3 x 2 days, 2 x 2 days, 1 x 2 days, then stop no refills.  

## 2012-05-06 LAB — CBC WITH DIFFERENTIAL
Basophils Absolute: 0 10*3/uL (ref 0.0–0.2)
Immature Granulocytes: 0 % (ref 0–2)
Lymphocytes Absolute: 1.1 10*3/uL (ref 0.7–3.1)
MCH: 33.2 pg — ABNORMAL HIGH (ref 26.6–33.0)
MCHC: 34.7 g/dL (ref 31.5–35.7)
MCV: 96 fL (ref 79–97)
Monocytes Absolute: 0.9 10*3/uL (ref 0.1–0.9)
Platelets: 195 10*3/uL (ref 155–379)
RDW: 13.2 % (ref 12.3–15.4)

## 2012-05-09 ENCOUNTER — Inpatient Hospital Stay: Payer: Self-pay

## 2012-05-09 LAB — CBC
HCT: 36.7 % (ref 35.0–47.0)
HGB: 13 g/dL (ref 12.0–16.0)
MCH: 34.2 pg — ABNORMAL HIGH (ref 26.0–34.0)
MCHC: 35.6 g/dL (ref 32.0–36.0)
MCV: 96 fL (ref 80–100)
Platelet: 171 10*3/uL (ref 150–440)
RBC: 3.81 10*6/uL (ref 3.80–5.20)
WBC: 19.7 10*3/uL — ABNORMAL HIGH (ref 3.6–11.0)

## 2012-05-09 LAB — URINALYSIS, COMPLETE
Bilirubin,UR: NEGATIVE
Glucose,UR: NEGATIVE mg/dL (ref 0–75)
Leukocyte Esterase: NEGATIVE
Nitrite: NEGATIVE
Ph: 7 (ref 4.5–8.0)
RBC,UR: 205 /HPF (ref 0–5)
Squamous Epithelial: <1

## 2012-05-09 LAB — BASIC METABOLIC PANEL
Chloride: 95 mmol/L — ABNORMAL LOW (ref 98–107)
Co2: 24 mmol/L (ref 21–32)
Creatinine: 0.82 mg/dL (ref 0.60–1.30)
EGFR (Non-African Amer.): >60
Glucose: 120 mg/dL — ABNORMAL HIGH (ref 65–99)
Osmolality: 270 (ref 275–301)

## 2012-05-09 LAB — PROTIME-INR: INR: 1.3

## 2012-05-09 LAB — APTT
Activated PTT: 32.4 secs (ref 23.6–35.9)
Activated PTT: 140.1 secs — ABNORMAL HIGH (ref 23.6–35.9)

## 2012-05-10 LAB — CBC WITH DIFFERENTIAL/PLATELET
Lymphocyte #: 1.4 10*3/uL (ref 1.0–3.6)
Lymphocyte %: 12.8 %
MCHC: 35.6 g/dL (ref 32.0–36.0)
Monocyte #: 1.9 x10 3/mm — ABNORMAL HIGH (ref 0.2–0.9)
Monocyte %: 17.8 %
Neutrophil #: 7.3 10*3/uL — ABNORMAL HIGH (ref 1.4–6.5)
Neutrophil %: 67.9 %
RDW: 13.3 % (ref 11.5–14.5)
WBC: 10.8 10*3/uL (ref 3.6–11.0)

## 2012-05-10 LAB — BASIC METABOLIC PANEL
Anion Gap: 6 — ABNORMAL LOW (ref 7–16)
BUN: 26 mg/dL — ABNORMAL HIGH (ref 7–18)
Calcium, Total: 8.7 mg/dL (ref 8.5–10.1)
Glucose: 145 mg/dL — ABNORMAL HIGH (ref 65–99)
Sodium: 134 mmol/L — ABNORMAL LOW (ref 136–145)

## 2012-05-10 LAB — APTT: Activated PTT: >160 secs (ref 23.6–35.9)

## 2012-05-10 LAB — PHENYTOIN LEVEL, TOTAL: Dilantin: 13.1 ug/mL (ref 10.0–20.0)

## 2012-05-12 ENCOUNTER — Encounter: Payer: Self-pay | Admitting: Internal Medicine

## 2012-05-12 LAB — PROTIME-INR: INR: 1.3

## 2012-05-12 LAB — BASIC METABOLIC PANEL
Anion Gap: 6 — ABNORMAL LOW (ref 7–16)
BUN: 25 mg/dL — ABNORMAL HIGH (ref 7–18)
Calcium, Total: 8.7 mg/dL (ref 8.5–10.1)
Chloride: 97 mmol/L — ABNORMAL LOW (ref 98–107)
Co2: 27 mmol/L (ref 21–32)
Creatinine: 0.86 mg/dL (ref 0.60–1.30)
Osmolality: 267 (ref 275–301)
Potassium: 5.1 mmol/L (ref 3.5–5.1)

## 2012-05-12 LAB — CBC WITH DIFFERENTIAL/PLATELET
Eosinophil %: 2.2 %
HCT: 30.2 % — ABNORMAL LOW (ref 35.0–47.0)
HGB: 10.7 g/dL — ABNORMAL LOW (ref 12.0–16.0)
Lymphocyte #: 0.9 10*3/uL — ABNORMAL LOW (ref 1.0–3.6)
Lymphocyte %: 7.5 %
MCHC: 35.3 g/dL (ref 32.0–36.0)
MCV: 97 fL (ref 80–100)
Monocyte #: 1.7 x10 3/mm — ABNORMAL HIGH (ref 0.2–0.9)
Monocyte %: 15.2 %
Neutrophil %: 73.5 %
WBC: 11.3 10*3/uL — ABNORMAL HIGH (ref 3.6–11.0)

## 2012-05-12 LAB — APTT: Activated PTT: 65.2 secs — ABNORMAL HIGH (ref 23.6–35.9)

## 2012-05-13 LAB — BASIC METABOLIC PANEL
Anion Gap: 7 (ref 7–16)
BUN: 26 mg/dL — ABNORMAL HIGH (ref 7–18)
Chloride: 95 mmol/L — ABNORMAL LOW (ref 98–107)
Co2: 25 mmol/L (ref 21–32)
EGFR (Non-African Amer.): >60
Glucose: 136 mg/dL — ABNORMAL HIGH (ref 65–99)
Potassium: 4.2 mmol/L (ref 3.5–5.1)
Sodium: 127 mmol/L — ABNORMAL LOW (ref 136–145)

## 2012-05-13 LAB — APTT: Activated PTT: 39.4 secs — ABNORMAL HIGH (ref 23.6–35.9)

## 2012-05-13 LAB — PLATELET COUNT: Platelet: 191 10*3/uL (ref 150–440)

## 2012-05-14 LAB — BASIC METABOLIC PANEL
BUN: 20 mg/dL — ABNORMAL HIGH (ref 7–18)
Calcium, Total: 7.9 mg/dL — ABNORMAL LOW (ref 8.5–10.1)
Chloride: 99 mmol/L (ref 98–107)
Co2: 24 mmol/L (ref 21–32)
Creatinine: 0.79 mg/dL (ref 0.60–1.30)
EGFR (African American): >60
Potassium: 4.3 mmol/L (ref 3.5–5.1)

## 2012-05-14 LAB — HEMOGLOBIN: HGB: 7.2 g/dL — ABNORMAL LOW (ref 12.0–16.0)

## 2012-05-15 LAB — HEMOGLOBIN
HGB: 7.3 g/dL — ABNORMAL LOW (ref 12.0–16.0)
HGB: 8.9 g/dL — ABNORMAL LOW (ref 12.0–16.0)

## 2012-05-15 LAB — BASIC METABOLIC PANEL
Anion Gap: 6 — ABNORMAL LOW (ref 7–16)
Co2: 23 mmol/L (ref 21–32)
EGFR (Non-African Amer.): >60
Osmolality: 261 (ref 275–301)
Sodium: 128 mmol/L — ABNORMAL LOW (ref 136–145)

## 2012-05-15 LAB — PLATELET COUNT: Platelet: 163 10*3/uL (ref 150–440)

## 2012-05-16 LAB — BASIC METABOLIC PANEL
BUN: 18 mg/dL (ref 7–18)
Chloride: 99 mmol/L (ref 98–107)
Creatinine: 0.61 mg/dL (ref 0.60–1.30)
Glucose: 123 mg/dL — ABNORMAL HIGH (ref 65–99)
Potassium: 3.4 mmol/L — ABNORMAL LOW (ref 3.5–5.1)

## 2012-05-17 ENCOUNTER — Encounter: Payer: Self-pay | Admitting: Internal Medicine

## 2012-05-17 LAB — BASIC METABOLIC PANEL
Chloride: 101 mmol/L (ref 98–107)
Creatinine: 0.6 mg/dL (ref 0.60–1.30)
EGFR (Non-African Amer.): >60
Glucose: 119 mg/dL — ABNORMAL HIGH (ref 65–99)
Potassium: 3.4 mmol/L — ABNORMAL LOW (ref 3.5–5.1)
Sodium: 132 mmol/L — ABNORMAL LOW (ref 136–145)

## 2012-05-17 LAB — HEMOGLOBIN: HGB: 8.5 g/dL — ABNORMAL LOW (ref 12.0–16.0)

## 2012-05-27 ENCOUNTER — Ambulatory Visit (INDEPENDENT_AMBULATORY_CARE_PROVIDER_SITE_OTHER): Payer: 59

## 2012-05-27 DIAGNOSIS — J309 Allergic rhinitis, unspecified: Secondary | ICD-10-CM

## 2012-06-05 ENCOUNTER — Encounter: Payer: Self-pay | Admitting: Internal Medicine

## 2012-07-04 ENCOUNTER — Ambulatory Visit: Payer: 59 | Admitting: Internal Medicine

## 2012-07-26 ENCOUNTER — Ambulatory Visit: Payer: Self-pay | Admitting: Neurology

## 2012-08-22 ENCOUNTER — Other Ambulatory Visit: Payer: Self-pay | Admitting: *Deleted

## 2012-08-22 MED ORDER — SERTRALINE HCL 50 MG PO TABS: 50.0000 mg | ORAL_TABLET | Freq: Every day | ORAL | Status: DC

## 2012-08-26 ENCOUNTER — Telehealth: Payer: Self-pay | Admitting: Internal Medicine

## 2012-08-26 MED ORDER — PREDNISONE 10 MG PO TABS: ORAL_TABLET | ORAL | Status: DC

## 2012-08-26 NOTE — Telephone Encounter (Signed)
Spoke with patient-she is having an asthma flare up; cough-productive-beige in color, SOB and slight wheezing x 2 days. Denies any fever or chills.  CY is out of the office this week; will send to [facility] as doc of the morning.   No Known Allergies

## 2012-08-26 NOTE — Telephone Encounter (Signed)
Prefer she come see tammy np but if declines Prednisone 10 mg take  4 each am x 2 days,   2 each am x 2 days,  1 each am x 2 days and stop

## 2012-08-26 NOTE — Telephone Encounter (Signed)
Spoke with patient; TP has no openings therefore prednisone rx sent to drug store. Pt aware.

## 2012-09-06 ENCOUNTER — Other Ambulatory Visit: Payer: Self-pay | Admitting: *Deleted

## 2012-09-07 MED ORDER — ROSUVASTATIN CALCIUM 20 MG PO TABS: 20.0000 mg | ORAL_TABLET | Freq: Every day | ORAL | Status: DC

## 2012-09-21 ENCOUNTER — Other Ambulatory Visit: Payer: Self-pay

## 2012-09-21 MED ORDER — PHENYTOIN SODIUM EXTENDED 100 MG PO CAPS: 400.0000 mg | ORAL_CAPSULE | Freq: Four times a day (QID) | ORAL | Status: DC

## 2012-09-21 MED ORDER — LEVETIRACETAM 750 MG PO TABS: 750.0000 mg | ORAL_TABLET | Freq: Two times a day (BID) | ORAL | Status: DC

## 2012-09-21 NOTE — Telephone Encounter (Signed)
Patient has an appt in Feb 2015

## 2012-09-23 ENCOUNTER — Telehealth: Payer: Self-pay | Admitting: Neurology

## 2012-09-23 MED ORDER — PHENYTOIN SODIUM EXTENDED 100 MG PO CAPS: 400.0000 mg | ORAL_CAPSULE | Freq: Every day | ORAL | Status: DC

## 2012-09-23 NOTE — Telephone Encounter (Signed)
Rx updated and re-sent.

## 2012-09-23 NOTE — Telephone Encounter (Signed)
Brooke Baker from [facility] received an e-script from the office for Dilantin 100mg  #120, the directions were to take 4 caps four times a day.  The patient was currently taking 4 caps at bedtime, please clarify.  [phone]

## 2012-10-23 ENCOUNTER — Other Ambulatory Visit: Payer: Self-pay | Admitting: Internal Medicine

## 2012-10-23 MED ORDER — HYDROCHLOROTHIAZIDE 25 MG PO TABS: 25.0000 mg | ORAL_TABLET | Freq: Every day | ORAL | Status: DC

## 2012-10-23 MED ORDER — SERTRALINE HCL 50 MG PO TABS: 50.0000 mg | ORAL_TABLET | Freq: Every day | ORAL | Status: DC

## 2012-10-23 NOTE — Progress Notes (Signed)
Refilled sertraline #30 with 2 refills and hctz #30 with 6 refills.

## 2012-12-02 ENCOUNTER — Other Ambulatory Visit: Payer: Self-pay | Admitting: *Deleted

## 2012-12-02 MED ORDER — LOSARTAN POTASSIUM 100 MG PO TABS: 100.0000 mg | ORAL_TABLET | Freq: Every day | ORAL | Status: DC

## 2012-12-02 NOTE — Telephone Encounter (Signed)
[place] to refill? Last seen in March-no future appt scheduled. Please advise

## 2012-12-02 NOTE — Telephone Encounter (Signed)
I called the pharmacy and changed the medication refill to say Losartan #30 with no refills.  She needs a f/u appt with me and fasting labs before the next appt.  She is overdue.  Thanks.

## 2012-12-06 NOTE — Telephone Encounter (Signed)
The patient has been scheduled for an office visit and labs.

## 2012-12-06 NOTE — Telephone Encounter (Signed)
This patient needs a f/u appt with Dr. Lorin Picket and fasting labs before the next appt. She is overdue. Please call patient to schedule appointments. (We sent in a 30 day supply of her Losartan with no refills). Thanks

## 2012-12-08 ENCOUNTER — Other Ambulatory Visit (INDEPENDENT_AMBULATORY_CARE_PROVIDER_SITE_OTHER): Payer: 59

## 2012-12-08 ENCOUNTER — Telehealth: Payer: Self-pay | Admitting: *Deleted

## 2012-12-08 DIAGNOSIS — I6350 Cerebral infarction due to unspecified occlusion or stenosis of unspecified cerebral artery: Secondary | ICD-10-CM

## 2012-12-08 DIAGNOSIS — E119 Type 2 diabetes mellitus without complications: Secondary | ICD-10-CM

## 2012-12-08 DIAGNOSIS — I4891 Unspecified atrial fibrillation: Secondary | ICD-10-CM

## 2012-12-08 DIAGNOSIS — G40909 Epilepsy, unspecified, not intractable, without status epilepticus: Secondary | ICD-10-CM

## 2012-12-08 DIAGNOSIS — E7800 Pure hypercholesterolemia, unspecified: Secondary | ICD-10-CM

## 2012-12-08 DIAGNOSIS — I10 Essential (primary) hypertension: Secondary | ICD-10-CM

## 2012-12-08 LAB — BASIC METABOLIC PANEL
BUN: 25 mg/dL — ABNORMAL HIGH (ref 6–23)
Calcium: 9.2 mg/dL (ref 8.4–10.5)
Chloride: 109 mEq/L (ref 96–112)
Creatinine, Ser: 1 mg/dL (ref 0.4–1.2)
GFR: 57.89 mL/min — ABNORMAL LOW (ref >60.00)
Potassium: 5.2 mEq/L — ABNORMAL HIGH (ref 3.5–5.1)

## 2012-12-08 LAB — HEPATIC FUNCTION PANEL
ALT: 21 U/L (ref 0–35)
AST: 28 U/L (ref 0–37)
Albumin: 3.1 g/dL — ABNORMAL LOW (ref 3.5–5.2)
Alkaline Phosphatase: 111 U/L (ref 39–117)
Bilirubin, Direct: 0.2 mg/dL (ref 0.0–0.3)
Total Bilirubin: 0.7 mg/dL (ref 0.3–1.2)
Total Protein: 6.9 g/dL (ref 6.0–8.3)

## 2012-12-08 LAB — TSH: TSH: 2.54 u[IU]/mL (ref 0.35–5.50)

## 2012-12-08 LAB — LIPID PANEL
Cholesterol: 138 mg/dL (ref 0–200)
LDL Cholesterol: 71 mg/dL (ref 0–99)
Triglycerides: 57 mg/dL (ref 0.0–149.0)
VLDL: 11.4 mg/dL (ref 0.0–40.0)

## 2012-12-08 NOTE — Telephone Encounter (Signed)
Orders placed for labs

## 2012-12-08 NOTE — Telephone Encounter (Signed)
What labs and dx?  

## 2012-12-09 ENCOUNTER — Other Ambulatory Visit: Payer: 59

## 2012-12-12 ENCOUNTER — Encounter (INDEPENDENT_AMBULATORY_CARE_PROVIDER_SITE_OTHER): Payer: Self-pay

## 2012-12-12 ENCOUNTER — Ambulatory Visit (INDEPENDENT_AMBULATORY_CARE_PROVIDER_SITE_OTHER): Payer: 59 | Admitting: Internal Medicine

## 2012-12-12 ENCOUNTER — Encounter: Payer: Self-pay | Admitting: Internal Medicine

## 2012-12-12 VITALS — BP 120/68 | HR 77 | Temp 97.5°F | Resp 12 | Ht 66.0 in | Wt 235.0 lb

## 2012-12-12 DIAGNOSIS — I2510 Atherosclerotic heart disease of native coronary artery without angina pectoris: Secondary | ICD-10-CM

## 2012-12-12 DIAGNOSIS — G8929 Other chronic pain: Secondary | ICD-10-CM

## 2012-12-12 DIAGNOSIS — G40909 Epilepsy, unspecified, not intractable, without status epilepticus: Secondary | ICD-10-CM

## 2012-12-12 DIAGNOSIS — I10 Essential (primary) hypertension: Secondary | ICD-10-CM

## 2012-12-12 DIAGNOSIS — E7800 Pure hypercholesterolemia, unspecified: Secondary | ICD-10-CM

## 2012-12-12 DIAGNOSIS — E878 Other disorders of electrolyte and fluid balance, not elsewhere classified: Secondary | ICD-10-CM

## 2012-12-12 DIAGNOSIS — J309 Allergic rhinitis, unspecified: Secondary | ICD-10-CM

## 2012-12-12 DIAGNOSIS — I4891 Unspecified atrial fibrillation: Secondary | ICD-10-CM

## 2012-12-12 DIAGNOSIS — I6350 Cerebral infarction due to unspecified occlusion or stenosis of unspecified cerebral artery: Secondary | ICD-10-CM

## 2012-12-12 DIAGNOSIS — E119 Type 2 diabetes mellitus without complications: Secondary | ICD-10-CM

## 2012-12-12 DIAGNOSIS — J302 Other seasonal allergic rhinitis: Secondary | ICD-10-CM

## 2012-12-12 DIAGNOSIS — M549 Dorsalgia, unspecified: Secondary | ICD-10-CM

## 2012-12-12 LAB — POTASSIUM: Potassium: 4.9 mEq/L (ref 3.5–5.3)

## 2012-12-12 NOTE — Progress Notes (Signed)
Pre visit review using our clinic review tool, if applicable. No additional management support is needed unless otherwise documented below in the visit note. 

## 2012-12-13 ENCOUNTER — Encounter: Payer: Self-pay | Admitting: *Deleted

## 2012-12-14 ENCOUNTER — Other Ambulatory Visit: Payer: Self-pay | Admitting: Internal Medicine

## 2012-12-17 ENCOUNTER — Encounter: Payer: Self-pay | Admitting: Internal Medicine

## 2012-12-17 NOTE — Progress Notes (Signed)
Subjective:    Patient ID: Brooke Baker, female    DOB: [birth year], 78 y.o.   MRN: [id number]  HPI 78 year old female with past history of COPD/asthma, hypertension, previous CVA, seizure disorder and known CAD.  She comes in today for a scheduled follow up.  Breathing is stable.  Using her Symbicort bid.  Still seeing Dr Maple Hudson.  Recently admitted after a fall and fracture.  Seeing Dr Ernest Pine.  Has follow up next week.  Was at [facility] and then continued physical therapy at [facility].  Using her walker.   Back is also still an issue and limits her from standing for a long period of time.  She feels is stable.  Seeing Dr Darrold Junker.  Feels her heart is stable.  No nausea or vomiting.  Using miralax to keep her bowels stable.     Past Medical History  Diagnosis Date  . Hypertension   . Acute bronchitis   . CVA (cerebral vascular accident)   . CAD (coronary artery disease)     s/p stent LAD 8/03 and stent placement OM1  . Allergic rhinitis   . COPD with asthma   . Chronic back pain   . Seizure disorder   . Hypercholesterolemia   . Diabetes mellitus     Current Outpatient Prescriptions on File Prior to Visit  Medication Sig Dispense Refill  . albuterol (PROVENTIL) (2.5 MG/3ML) 0.083% nebulizer solution Take 3 mLs (2.5 mg total) by nebulization every 4 (four) hours as needed for wheezing.  75 mL  2  . albuterol (VENTOLIN HFA) 108 (90 BASE) MCG/ACT inhaler Inhale 2 puffs into the lungs every 4 (four) hours as needed for wheezing.  1 Inhaler  11  . aspirin 81 MG tablet Take 81 mg by mouth daily.        . budesonide-formoterol (SYMBICORT) 160-4.5 MCG/ACT inhaler Inhale 2 puffs into the lungs 2 (two) times daily. Rinse mouth  1 Inhaler  prn  . dabigatran (PRADAXA) 150 MG CAPS Take 150 mg by mouth every 12 (twelve) hours.      [name] dextromethorphan-guaiFENesin (MUCINEX DM) 30-600 MG per 12 hr tablet Take 1 tablet by mouth every 12 (twelve) hours as needed.       [name] EPINEPHrine (EPIPEN) 0.3 mg/0.3  mL DEVI Inject 0.3 mLs (0.3 mg total) into the muscle once.  1 Device  prn  . fluocinonide (LIDEX) 0.05 % external solution Use as needed      . hydrochlorothiazide (HYDRODIURIL) 25 MG tablet Take 1 tablet (25 mg total) by mouth daily.  30 tablet  6  . levETIRAcetam (KEPPRA) 750 MG tablet Take 1 tablet (750 mg total) by mouth 2 (two) times daily.  60 tablet  4  . losartan (COZAAR) 100 MG tablet Take 1 tablet (100 mg total) by mouth daily.  30 tablet  5  . metoprolol succinate (TOPROL-XL) 50 MG 24 hr tablet Take 1 tablet (50 mg total) by mouth daily.  30 tablet  5  . Omega-3 Fatty Acids (FISH OIL) 1000 MG CAPS Take 1 capsule by mouth 2 (two) times daily.        . phenytoin (DILANTIN) 100 MG ER capsule Take 4 capsules (400 mg total) by mouth at bedtime.  120 capsule  4  . rosuvastatin (CRESTOR) 20 MG tablet Take 1 tablet (20 mg total) by mouth daily. NEEDS APPT FOR FURTHER REFILLS  30 tablet  3  . sertraline (ZOLOFT) 50 MG tablet Take 1 tablet (50 mg total) by  mouth daily.  30 tablet  2  . triamcinolone cream (KENALOG) 0.1 % Apply topically daily.       No current facility-administered medications on file prior to visit.    Review of Systems Patient denies any headache, lightheadedness or dizziness.  No significant sinus or allergy symptoms.   No chest pain, tightness or palpitations.  No increased shortness of breath, cough or congestion. Breathing stable now.  No acid reflux.  No nausea or vomiting.  No abdominal pain or cramping.  No bowel change, such as diarrhea or significant constipation.  Using miralax.  No BRBPR or melana.  No urine change.  Using her walker to ambulate.       Objective:   Physical Exam  Filed Vitals:   12/12/12 1613  BP: 120/68  Pulse: 77  Temp: 97.5 F (36.4 C)  Resp: 12   Blood pressure recheck:  19/90  78 year old female in no acute distress.   HEENT:  Nares- clear.  Oropharynx - without lesions. NECK:  Supple.  Nontender.  No audible bruit.  HEART:   Rate controlled.   LUNGS:  No crackles or wheezing audible.  Respirations even and unlabored.  RADIAL PULSE:  Equal bilaterally.   ABDOMEN:  Soft, nontender.  Bowel sounds present and normal.  No audible abdominal bruit.   EXTREMITIES:  No increased edema present.  DP pulses palpable and equal bilaterally.           Assessment & Plan:  ORTHO.  Seeing Dr Ernest Pine.  Has f/u planned next week.  Ambulating with her walker.  Follow.   HEALTH MAINTENANCE.  Physical last visit.  Mammogram 04/07/12 - BiRADS I.   Bone density 01/10/07 revealed improvement. Continue vitamin D.  Saw GI.  Declined colonoscopy.  See Fransico Setters note for details.

## 2012-12-17 NOTE — Assessment & Plan Note (Signed)
On dilantin and Keppra.  Followed by neurology.  Currently seizure free.  Follow.   

## 2012-12-17 NOTE — Assessment & Plan Note (Signed)
Known disease. Continue risk factor modification.  Currently asymptomatic.    

## 2012-12-17 NOTE — Assessment & Plan Note (Signed)
On crestor.  Low cholesterol diet.  Follow lipid panel and liver function.

## 2012-12-17 NOTE — Assessment & Plan Note (Signed)
Has had multiple back surgeries.  Chronic pain.  Follow.   

## 2012-12-17 NOTE — Assessment & Plan Note (Signed)
Low carb diet and weight loss.  Follow met b and a1c.  A1c checked 12/08/12 - 5.5.

## 2012-12-17 NOTE — Assessment & Plan Note (Signed)
Currently controlled.

## 2012-12-17 NOTE — Assessment & Plan Note (Signed)
On Pradaxa.  Rate controlled. She feels symptoms are stable.  Follow.  Keep follow up appt with Dr Paraschos.   

## 2012-12-17 NOTE — Assessment & Plan Note (Signed)
Blood pressure under good control.  Same meds.  Follow metabolic panel.   

## 2012-12-17 NOTE — Assessment & Plan Note (Signed)
No reoccurring symptoms.  Doing well.  Continue current med regimen.   

## 2012-12-22 ENCOUNTER — Other Ambulatory Visit: Payer: Self-pay | Admitting: Internal Medicine

## 2012-12-26 ENCOUNTER — Encounter: Payer: Self-pay | Admitting: Internal Medicine

## 2013-01-02 ENCOUNTER — Other Ambulatory Visit: Payer: Self-pay | Admitting: Internal Medicine

## 2013-01-18 ENCOUNTER — Telehealth: Payer: Self-pay | Admitting: Internal Medicine

## 2013-01-18 MED ORDER — AMOXICILLIN-POT CLAVULANATE 875-125 MG PO TABS: 1.0000 | ORAL_TABLET | Freq: Two times a day (BID) | ORAL | Status: DC

## 2013-01-18 MED ORDER — PREDNISONE 10 MG PO TABS: ORAL_TABLET | ORAL | Status: DC

## 2013-01-18 NOTE — Telephone Encounter (Signed)
Whitten, as doc of day please advise. CY out of the office this afternoon. Thanks.

## 2013-01-18 NOTE — Telephone Encounter (Signed)
Per Cy-lets give her Augmentin 875 mg #14 take 1 po bid no refills and Prednisone 10 mg #20 take 4 x 2 days, 3 x 2 days, 2 x 2 days, 1 x 2 days, then stop no refills.

## 2013-01-18 NOTE — Telephone Encounter (Signed)
I called and spoke with pt. Aware of recs. RX's sent. Nothing further needed

## 2013-01-18 NOTE — Telephone Encounter (Signed)
Last OV 04-25-12. Pt is c/o having productive cough with yellow phlegm, sinus congestion, chest tightness x 2 days. Pt denies any wheezing or fever. She is requesting an rx for abx and prednisone. Please advise. [place] Bing, CMA No Known Allergies  Current Outpatient Prescriptions on File Prior to Visit  Medication Sig Dispense Refill  . albuterol (PROVENTIL) (2.5 MG/3ML) 0.083% nebulizer solution Take 3 mLs (2.5 mg total) by nebulization every 4 (four) hours as needed for wheezing.  75 mL  2  . albuterol (VENTOLIN HFA) 108 (90 BASE) MCG/ACT inhaler Inhale 2 puffs into the lungs every 4 (four) hours as needed for wheezing.  1 Inhaler  11  . amLODipine (NORVASC) 10 MG tablet TAKE ONE (1) TABLET EACH DAY  30 tablet  5  . aspirin 81 MG tablet Take 81 mg by mouth daily.        . budesonide-formoterol (SYMBICORT) 160-4.5 MCG/ACT inhaler Inhale 2 puffs into the lungs 2 (two) times daily. Rinse mouth  1 Inhaler  prn  . dabigatran (PRADAXA) 150 MG CAPS Take 150 mg by mouth every 12 (twelve) hours.      [name] dextromethorphan-guaiFENesin (MUCINEX DM) 30-600 MG per 12 hr tablet Take 1 tablet by mouth every 12 (twelve) hours as needed.       [name] EPINEPHrine (EPIPEN) 0.3 mg/0.3 mL DEVI Inject 0.3 mLs (0.3 mg total) into the muscle once.  1 Device  prn  . fluocinonide (LIDEX) 0.05 % external solution Use as needed      . hydrochlorothiazide (HYDRODIURIL) 25 MG tablet Take 1 tablet (25 mg total) by mouth daily.  30 tablet  6  . levETIRAcetam (KEPPRA) 750 MG tablet Take 1 tablet (750 mg total) by mouth 2 (two) times daily.  60 tablet  4  . losartan (COZAAR) 100 MG tablet Take 1 tablet (100 mg total) by mouth daily.  30 tablet  5  . metoprolol succinate (TOPROL-XL) 50 MG 24 hr tablet TAKE ONE TABLET EVERY NIGHT  30 tablet  5  . Omega-3 Fatty Acids (FISH OIL) 1000 MG CAPS Take 1 capsule by mouth 2 (two) times daily.        . phenytoin (DILANTIN) 100 MG ER capsule Take 4 capsules (400 mg total) by mouth at bedtime.   120 capsule  4  . predniSONE (DELTASONE) 10 MG tablet as needed.      . rosuvastatin (CRESTOR) 20 MG tablet Take 1 tablet by once a day  30 tablet  5  . sertraline (ZOLOFT) 50 MG tablet Take 1 tablet (50 mg total) by mouth daily.  30 tablet  2  . triamcinolone cream (KENALOG) 0.1 % Apply topically daily.       No current facility-administered medications on file prior to visit.

## 2013-01-19 ENCOUNTER — Other Ambulatory Visit: Payer: Self-pay | Admitting: Internal Medicine

## 2013-02-06 ENCOUNTER — Encounter: Payer: Self-pay | Admitting: Neurology

## 2013-02-10 ENCOUNTER — Ambulatory Visit (INDEPENDENT_AMBULATORY_CARE_PROVIDER_SITE_OTHER): Payer: 59

## 2013-02-10 ENCOUNTER — Ambulatory Visit: Payer: Self-pay | Admitting: Neurology

## 2013-02-10 DIAGNOSIS — J309 Allergic rhinitis, unspecified: Secondary | ICD-10-CM

## 2013-02-16 ENCOUNTER — Other Ambulatory Visit: Payer: Self-pay

## 2013-02-16 MED ORDER — LEVETIRACETAM 750 MG PO TABS: 750.0000 mg | ORAL_TABLET | Freq: Two times a day (BID) | ORAL | Status: DC

## 2013-02-16 MED ORDER — PHENYTOIN SODIUM EXTENDED 100 MG PO CAPS: 400.0000 mg | ORAL_CAPSULE | Freq: Every day | ORAL | Status: DC

## 2013-04-13 ENCOUNTER — Other Ambulatory Visit: Payer: Self-pay | Admitting: *Deleted

## 2013-04-13 MED ORDER — BUDESONIDE-FORMOTEROL FUMARATE 80-4.5 MCG/ACT IN AERO: 2.0000 | INHALATION_SPRAY | Freq: Two times a day (BID) | RESPIRATORY_TRACT | Status: DC

## 2013-04-13 NOTE — Telephone Encounter (Signed)
Per CDY okay to refill symbicort.

## 2013-04-19 ENCOUNTER — Other Ambulatory Visit: Payer: Self-pay | Admitting: Internal Medicine

## 2013-05-04 ENCOUNTER — Other Ambulatory Visit: Payer: Self-pay | Admitting: Internal Medicine

## 2013-05-10 ENCOUNTER — Other Ambulatory Visit: Payer: Self-pay | Admitting: Internal Medicine

## 2013-05-10 MED ORDER — ALBUTEROL SULFATE HFA 108 (90 BASE) MCG/ACT IN AERS: 2.0000 | INHALATION_SPRAY | RESPIRATORY_TRACT | Status: DC | PRN

## 2013-05-10 NOTE — Telephone Encounter (Signed)
I called patient and explained that she is overdue for an appt with CY and needs to make and keep OV. Appt made for Monday 06-12-13 at 2:15pm with CY and patient aware that Rx refill has been sent to last until that OV.

## 2013-05-11 ENCOUNTER — Ambulatory Visit (INDEPENDENT_AMBULATORY_CARE_PROVIDER_SITE_OTHER): Payer: 59 | Admitting: Internal Medicine

## 2013-05-11 ENCOUNTER — Encounter: Payer: Self-pay | Admitting: Internal Medicine

## 2013-05-11 VITALS — BP 114/62 | HR 79 | Ht 66.0 in | Wt 219.2 lb

## 2013-05-11 DIAGNOSIS — J3089 Other allergic rhinitis: Secondary | ICD-10-CM

## 2013-05-11 DIAGNOSIS — J209 Acute bronchitis, unspecified: Secondary | ICD-10-CM

## 2013-05-11 DIAGNOSIS — J309 Allergic rhinitis, unspecified: Secondary | ICD-10-CM

## 2013-05-11 DIAGNOSIS — J302 Other seasonal allergic rhinitis: Secondary | ICD-10-CM

## 2013-05-11 DIAGNOSIS — I4891 Unspecified atrial fibrillation: Secondary | ICD-10-CM

## 2013-05-11 MED ORDER — ALBUTEROL SULFATE (2.5 MG/3ML) 0.083% IN NEBU: 2.5000 mg | INHALATION_SOLUTION | RESPIRATORY_TRACT | Status: DC | PRN

## 2013-05-11 MED ORDER — ALBUTEROL SULFATE HFA 108 (90 BASE) MCG/ACT IN AERS: 2.0000 | INHALATION_SPRAY | RESPIRATORY_TRACT | Status: DC | PRN

## 2013-05-11 MED ORDER — METHYLPREDNISOLONE ACETATE 80 MG/ML IJ SUSP
80.0000 mg | Freq: Once | INTRAMUSCULAR | Status: AC
  Administered 2013-05-11: 80 mg via INTRAMUSCULAR

## 2013-05-11 MED ORDER — PREDNISONE 10 MG PO TABS: ORAL_TABLET | ORAL | Status: DC

## 2013-05-11 NOTE — Patient Instructions (Signed)
Scripts sent for prednisone taper, albuterol HFA rescue inhaler, and albuterol nebulizer solution  Depo 80  We can continue allergy vaccine 1:10  Please call as needed

## 2013-05-11 NOTE — Progress Notes (Signed)
Patient ID: Brooke Baker, female    DOB: 04/03/1934, 79 y.o.   MRN: [id number]  Asthma Her past medical history is significant for asthma.   07/07/10- 76 yoF never smoker followed for Asthma, allergic rhinitis, complicated by hx CAD, CVA. Last here January 08, 2010 - note reviewed.  She is staying in mostly, in hot weather, but tanned from tanning bed for psoriasis. Has been more aware of dyspnea for months, more with exertion than at rest. She has noted a little wheeze, but main concern is with her heart. Can't sleep supine due to dyspnea, but better on either side. Nocturia x 2. Denies productive cough,  chest pain or palpitation. She has cardiologist at [facility]. PCP is Dr Einar Pheasant, who saw her recently and has ordered blood work, pending.  No recent CXR.  Continues allergy vaccine- feels the shots  have helped her. 1:10 GO.  Using ventolin rescue inhaler 3-4 x recently, symbicort twice daily. Not on prednisone now.    01/12/11- 76 yoF never smoker followed for Asthma, allergic rhinitis, complicated by hx CAD, CVA. Acute visit-having increased SOB, wheezing, cough-productive-yellow/green in color She slipped and fell at home several days ago and has 2 black eyes, on Plavix. She was evaluated with no serious trauma found. Has had flu vaccine. Continues allergy vaccine. For past 3 or 4 days has had increased shortness of breath, wheeze, cough with greenish sputum. Denies fever, sore throat, GI upset. Sleeping in recliner because of postnasal drip. Denies ankle edema.  01/04/12- 29 yoF never smoker followed for Asthma, allergic rhinitis, complicated by hx CAD, CVA. FOLLOWS FOR: doing well since last visit; no longer having the wheezing. Was treated at a walk in clinic during the fall season, for bronchitis. Occasional episode of cough or wheeze. Now on Pradaxa for AFib Continues allergy vaccine 1:10. Family gives w/o problem.  04/25/12 Acute OV  Complains of increased SOB,  wheezing, prod cough with brown/yellow mucus x3days - No fever or chest pain. Appetite is good. No n/v.  No otc used.  No recent travel or abx use.  Cough is getting worse. More wheezing this am.   05/11/13- 78 yoF never smoker followed for Asthma, allergic rhinitis, complicated by hx DM, CAD, CVA/AFib            Husband here ACUTE VISIT:  Increased SOB, wheezing, PND and cough with clear mucus worsening over past 2 days.  Allergy vaccine 1:10 by family, doing well (pt does at home) Reports several weeks of increased cough, had hayfever during the spring. Wheezing congestion without sore throat or fever. Had to sleep in recliner for 2 nights. Incidental history surgery for fracture right femur in 2014. She denies recent leg pain or swelling.   Review of Systems-see HPI Constitutional:   No-   weight loss, night sweats, fevers, chills, fatigue, lassitude. HEENT:   No-  headaches, difficulty swallowing, tooth/dental problems, sore throat,       No-  sneezing, itching, ear ache, nasal congestion, + post nasal drip,  CV:  No-   chest pain,  +orthopnea, PND, swelling in lower extremities, anasarca,  dizziness, palpitations Resp: +shortness of breath with exertion or at rest.              No-   productive cough,  + non-productive cough,  No- coughing up of blood.              No-   change in color of mucus.  +  wheezing.   Skin: No-   rash or lesions. GI:  No-   heartburn, indigestion, abdominal pain, nausea, vomiting,  GU: MS:  No-   joint pain or swelling.  . Neuro-     nothing unusual Psych:  No- change in mood or affect. No depression or anxiety.  No memory loss.   Objective:   Physical Exam General- Alert, Oriented, Affect-appropriate, Distress- none acute, obese Skin- rash-none, lesions- none, excoriation- none Lymphadenopathy- none Head- atraumatic            Eyes- Gross vision intact, PERRLA, conjunctivae clear secretions.             Ears- Hearing, canals-normal            Nose-  Sniffing, no-Septal dev, mucus, polyps, erosion, perforation             Throat- Mallampati II , mucosa clear , drainage- none, tonsils- atrophic Neck- flexible , trachea midline, no stridor , thyroid nl, carotid no bruit Chest - symmetrical excursion , unlabored           Heart/CV-+ IRR , no murmur , no gallop  , no rub, nl s1 s2                           - JVD- none , edema- none, stasis changes- none, varices- none           Lung-    + cough-raspy, wheeze-none           Chest wall-  Abd-  Br/ Gen/ Rectal- Not done, not indicated Extrem- cyanosis- none, clubbing, none, atrophy- none, strength- nl Neuro- grossly intact to observation

## 2013-05-19 ENCOUNTER — Telehealth: Payer: Self-pay | Admitting: Internal Medicine

## 2013-05-19 MED ORDER — DOXYCYCLINE HYCLATE 100 MG PO TABS: ORAL_TABLET | ORAL | Status: DC

## 2013-05-19 NOTE — Telephone Encounter (Signed)
Spoke with Brooke Baker. C/o prod cough-yellow phlem, wheezing, chest tx, increase SOB, PND, nasal cong, runny nose x couple weeks. Brooke Baker was in to see CDY 05/12/13. Finished prednisone Please advise Dr. Annamaria Boots thanks  No Known Allergies   Current Outpatient Prescriptions on File Prior to Visit  Medication Sig Dispense Refill  . albuterol (PROVENTIL) (2.5 MG/3ML) 0.083% nebulizer solution Take 3 mLs (2.5 mg total) by nebulization every 4 (four) hours as needed for wheezing.  75 mL  prn  . albuterol (VENTOLIN HFA) 108 (90 BASE) MCG/ACT inhaler Inhale 2 puffs into the lungs every 4 (four) hours as needed for wheezing.  1 Inhaler  prn  . amLODipine (NORVASC) 10 MG tablet TAKE ONE (1) TABLET EACH DAY  30 tablet  5  . aspirin 81 MG tablet Take 81 mg by mouth daily.        . budesonide-formoterol (SYMBICORT) 80-4.5 MCG/ACT inhaler Inhale 2 puffs into the lungs 2 (two) times daily.  1 Inhaler  5  . dabigatran (PRADAXA) 150 MG CAPS Take 150 mg by mouth every 12 (twelve) hours.      [name] dextromethorphan-guaiFENesin (MUCINEX DM) 30-600 MG per 12 hr tablet Take 1 tablet by mouth every 12 (twelve) hours as needed.       [name] EPINEPHrine (EPIPEN) 0.3 mg/0.3 mL DEVI Inject 0.3 mLs (0.3 mg total) into the muscle once.  1 Device  prn  . fluocinonide (LIDEX) 0.05 % external solution Use as needed      . fluticasone (FLONASE) 50 MCG/ACT nasal spray USE 2 SPRAYS EACH NOSTRIL DAILY  16 g  1  . hydrochlorothiazide (HYDRODIURIL) 25 MG tablet Take 1 tablet (25 mg total) by mouth daily.  30 tablet  6  . levETIRAcetam (KEPPRA) 750 MG tablet Take 1 tablet (750 mg total) by mouth 2 (two) times daily.  60 tablet  5  . losartan (COZAAR) 100 MG tablet Take 1 tablet (100 mg total) by mouth daily.  30 tablet  5  . metoprolol succinate (TOPROL-XL) 50 MG 24 hr tablet TAKE ONE TABLET EVERY NIGHT  30 tablet  5  . Omega-3 Fatty Acids (FISH OIL) 1000 MG CAPS Take 1 capsule by mouth 2 (two) times daily.        . phenytoin (DILANTIN) 100 MG ER capsule  Take 4 capsules (400 mg total) by mouth at bedtime.  120 capsule  5  . predniSONE (DELTASONE) 10 MG tablet Take 4 tabs x 2 days, 3 tabs x 2 days, 2 tabs x 2 days, 1 tab x 2 days then stop  20 tablet  0  . rosuvastatin (CRESTOR) 20 MG tablet Take 1 tablet by once a day  30 tablet  5  . sertraline (ZOLOFT) 50 MG tablet TAKE ONE (1) TABLET BY MOUTH EVERY DAY  30 tablet  2  . triamcinolone cream (KENALOG) 0.1 % Apply topically daily.       No current facility-administered medications on file prior to visit.

## 2013-05-19 NOTE — Telephone Encounter (Signed)
Offer doxycycline 100 mg, # 8, 2 today then one daily           prometh codeine 180 ml,     5 ml every 6t hours if needed for cough

## 2013-05-19 NOTE — Telephone Encounter (Signed)
Called spoke with pt. She would like just the ABX called in for now. She will call back if she needs the cough syrup. Nothing further needed

## 2013-05-24 ENCOUNTER — Other Ambulatory Visit: Payer: Self-pay | Admitting: Internal Medicine

## 2013-05-30 ENCOUNTER — Other Ambulatory Visit (INDEPENDENT_AMBULATORY_CARE_PROVIDER_SITE_OTHER): Payer: 59

## 2013-05-30 ENCOUNTER — Ambulatory Visit (INDEPENDENT_AMBULATORY_CARE_PROVIDER_SITE_OTHER): Payer: 59 | Admitting: Adult Health

## 2013-05-30 ENCOUNTER — Ambulatory Visit (INDEPENDENT_AMBULATORY_CARE_PROVIDER_SITE_OTHER)
Admission: RE | Admit: 2013-05-30 | Discharge: 2013-05-30 | Disposition: A | Discharge status: Home / Self Care | Payer: 59 | Source: Ambulatory Visit | Attending: Adult Health | Admitting: Adult Health

## 2013-05-30 ENCOUNTER — Encounter: Payer: Self-pay | Admitting: Adult Health

## 2013-05-30 VITALS — BP 106/62 | HR 74 | Temp 97.1°F | Ht 66.0 in | Wt 249.6 lb

## 2013-05-30 DIAGNOSIS — J209 Acute bronchitis, unspecified: Secondary | ICD-10-CM

## 2013-05-30 DIAGNOSIS — E119 Type 2 diabetes mellitus without complications: Secondary | ICD-10-CM

## 2013-05-30 DIAGNOSIS — R0989 Other specified symptoms and signs involving the circulatory and respiratory systems: Secondary | ICD-10-CM

## 2013-05-30 DIAGNOSIS — R0609 Other forms of dyspnea: Secondary | ICD-10-CM

## 2013-05-30 LAB — BASIC METABOLIC PANEL
BUN: 21 mg/dL (ref 6–23)
CALCIUM: 8.8 mg/dL (ref 8.4–10.5)
CO2: 24 mEq/L (ref 19–32)
Chloride: 96 mEq/L (ref 96–112)
Creatinine, Ser: 0.9 mg/dL (ref 0.4–1.2)
GFR: 61.38 mL/min (ref >60.00)
GLUCOSE: 102 mg/dL — AB (ref 70–99)
Potassium: 4.5 mEq/L (ref 3.5–5.1)
Sodium: 128 mEq/L — ABNORMAL LOW (ref 135–145)

## 2013-05-30 LAB — BRAIN NATRIURETIC PEPTIDE: PRO B NATRI PEPTIDE: 152 pg/mL — AB (ref 0.0–100.0)

## 2013-05-30 LAB — MICROALBUMIN / CREATININE URINE RATIO
Creatinine,U: 73.1 mg/dL
Microalb Creat Ratio: 1.4 mg/g (ref 0.0–30.0)
Microalb, Ur: 1 mg/dL (ref 0.0–1.9)

## 2013-05-30 MED ORDER — HYDROCODONE-HOMATROPINE 5-1.5 MG/5ML PO SYRP: 5.0000 mL | ORAL_SOLUTION | Freq: Four times a day (QID) | ORAL | Status: DC | PRN

## 2013-05-30 MED ORDER — PREDNISONE 10 MG PO TABS: ORAL_TABLET | ORAL | Status: DC

## 2013-05-30 MED ORDER — BUDESONIDE-FORMOTEROL FUMARATE 160-4.5 MCG/ACT IN AERO: 2.0000 | INHALATION_SPRAY | Freq: Two times a day (BID) | RESPIRATORY_TRACT | Status: DC

## 2013-05-30 MED ORDER — MOXIFLOXACIN HCL 400 MG PO TABS: 400.0000 mg | ORAL_TABLET | Freq: Every day | ORAL | Status: DC

## 2013-05-30 NOTE — Progress Notes (Signed)
Patient ID: Brooke Baker, female    DOB: 1934/05/30, 79 y.o.   MRN: [id number]  Asthma Her past medical history is significant for asthma.   07/07/10- 76 yoF never smoker followed for Asthma, allergic rhinitis, complicated by hx CAD, CVA. Last here January 08, 2010 - note reviewed.  She is staying in mostly, in hot weather, but tanned from tanning bed for psoriasis. Has been more aware of dyspnea for months, more with exertion than at rest. She has noted a little wheeze, but main concern is with her heart. Can't sleep supine due to dyspnea, but better on either side. Nocturia x 2. Denies productive cough,  chest pain or palpitation. She has cardiologist at [facility]. PCP is Dr Einar Pheasant, who saw her recently and has ordered blood work, pending.  No recent CXR.  Continues allergy vaccine- feels the shots  have helped her. 1:10 GO.  Using ventolin rescue inhaler 3-4 x recently, symbicort twice daily. Not on prednisone now.    01/12/11- 76 yoF never smoker followed for Asthma, allergic rhinitis, complicated by hx CAD, CVA. Acute visit-having increased SOB, wheezing, cough-productive-yellow/green in color She slipped and fell at home several days ago and has 2 black eyes, on Plavix. She was evaluated with no serious trauma found. Has had flu vaccine. Continues allergy vaccine. For past 3 or 4 days has had increased shortness of breath, wheeze, cough with greenish sputum. Denies fever, sore throat, GI upset. Sleeping in recliner because of postnasal drip. Denies ankle edema.  01/04/12- 57 yoF never smoker followed for Asthma, allergic rhinitis, complicated by hx CAD, CVA. FOLLOWS FOR: doing well since last visit; no longer having the wheezing. Was treated at a walk in clinic during the fall season, for bronchitis. Occasional episode of cough or wheeze. Now on Pradaxa for AFib Continues allergy vaccine 1:10. Family gives w/o problem.  04/25/12 Acute OV  Complains of increased SOB,  wheezing, prod cough with brown/yellow mucus x3days - No fever or chest pain. Appetite is good. No n/v.  No otc used.  No recent travel or abx use.  Cough is getting worse. More wheezing this am.  >Zpack   05/11/13- 78 yoF never smoker followed for Asthma, allergic rhinitis, complicated by hx DM, CAD, CVA/AFib ACUTE VISIT:  Increased SOB, wheezing, PND and cough with clear mucus worsening over past 2 days.  Allergy vaccine 1:10 by family, doing well (pt does at home) >pred taper /depo medrol .    05/30/2013 Acute OV (Asthma/AR-never smoker )  Complains of  wheezing, increased SOB, chest tightness, some cough with light yellow mucus x2 weeks.   Denies f/c/s, head congestion, hemoptysis, nausea, vomiting.   Finished Doxycycline last week. Has been on 2 steroid taper over last month. Gets some better but never gets over totally.  Remains on Symbicort. 80/4.61mcg .   Ankles more swollen. On norvasc 10mg  and HCTZ 25mg  .  No wt gain.  Eating good. No n/v/d.  No sinus congestion.    Review of Systems-see HPI Constitutional:   No-   weight loss, night sweats, fevers, chills, + fatigue, lassitude. HEENT:   No-  headaches, difficulty swallowing, tooth/dental problems, sore throat,       No-  sneezing, itching, ear ache, nasal congestion, CV:  No-   chest pain,  +orthopnea, PND, swelling in lower extremities, anasarca,  dizziness, palpitations Resp: +shortness of breath with exertion or at rest.              No-  productive cough,  + non-productive cough,  No- coughing up of blood.              No-   change in color of mucus.  + wheezing.   Skin: No-   rash or lesions. GI:  No-   heartburn, indigestion, abdominal pain, nausea, vomiting,  GU: MS:  No-   joint pain or swelling.  . Neuro-     nothing unusual Psych:  No- change in mood or affect. No depression or anxiety.  No memory loss.   Objective:   Physical Exam General- Alert, Oriented, Affect-appropriate, Distress- none acute,  obese Skin- rash-none, lesions- none, excoriation- none Lymphadenopathy- none Head- atraumatic            Eyes- Gross vision intact, PERRLA, conjunctivae clear secretions. Bilateral shiners            Ears- Hearing, canals-normal            Nose- Sniffing, no-Septal dev, mucus, polyps, erosion, perforation             Throat- Mallampati II , mucosa clear , drainage- none, tonsils- atrophic Neck- flexible , trachea midline, no stridor , thyroid nl, carotid no bruit Chest - symmetrical excursion , unlabored           Heart/CV-+ IRR , no murmur , no gallop  , no rub, nl s1 s2                           - JVD- none , edema- tr -1+ , stasis changes- none, varices- none           Lung-    Few exp wheezes            Chest wall-  Abd-  Br/ Gen/ Rectal- Not done, not indicated Extrem- cyanosis- none, clubbing, none, atrophy- none, strength- nl Neuro- grossly intact to observation

## 2013-05-30 NOTE — Assessment & Plan Note (Signed)
Slow to resolve flare  Check labs w/ bnp and cxr   Plan  Avelox 400mg  daily for 7 days  Mucinex DM Twice daily  As needed  Cough/congestion.  Fluids and rest  Prednisone taper over next week.  Increase Symbicort 160/4.41mcg 2 puffs Twice daily  Until sample is gone, then back to 69mcg dose.  Hydromet 1/2-1 tsp every 8hr as needed, may make you sleepy .  Chest xray and labs today .  Low salt diet , keep legs elevated.  Please contact office for sooner follow up if symptoms do not improve or worsen or seek emergency care  Follow up Dr. Annamaria Boots  4 weeks and As needed  And  As needed

## 2013-05-30 NOTE — Patient Instructions (Addendum)
Avelox 400mg  daily for 7 days  Mucinex DM Twice daily  As needed  Cough/congestion.  Fluids and rest  Prednisone taper over next week.  Increase Symbicort 160/4.74mcg 2 puffs Twice daily  Until sample is gone, then back to 52mcg dose.  Hydromet 1/2-1 tsp every 8hr as needed, may make you sleepy .  Chest xray and labs today .  Low salt diet , keep legs elevated.  Please contact office for sooner follow up if symptoms do not improve or worsen or seek emergency care  Follow up Dr. Annamaria Boots  4 weeks and As needed  And  As needed

## 2013-06-01 ENCOUNTER — Other Ambulatory Visit: Payer: Self-pay | Admitting: Internal Medicine

## 2013-06-01 DIAGNOSIS — E871 Hypo-osmolality and hyponatremia: Secondary | ICD-10-CM

## 2013-06-01 NOTE — Progress Notes (Signed)
Order placed for f/u labs.  

## 2013-06-02 ENCOUNTER — Other Ambulatory Visit: Payer: Self-pay | Admitting: Internal Medicine

## 2013-06-02 ENCOUNTER — Other Ambulatory Visit (INDEPENDENT_AMBULATORY_CARE_PROVIDER_SITE_OTHER): Payer: 59

## 2013-06-02 DIAGNOSIS — E871 Hypo-osmolality and hyponatremia: Secondary | ICD-10-CM

## 2013-06-02 LAB — SODIUM: SODIUM: 134 meq/L — AB (ref 135–145)

## 2013-06-02 LAB — TSH: TSH: 1.21 u[IU]/mL (ref 0.35–4.50)

## 2013-06-02 NOTE — Progress Notes (Signed)
Quick Note:  Called spoke with patient, advised of cxr results / recs as stated by TP. Pt verbalized her understanding and denied any questions. ______ 

## 2013-06-02 NOTE — Progress Notes (Signed)
Quick Note:  Called spoke with patient, advised of lab results / recs as stated by TP. Pt verbalized her understanding and denied any questions. PCP is aware of results and pt is due for repeat labs today. ______

## 2013-06-12 ENCOUNTER — Ambulatory Visit: Payer: 59 | Admitting: Internal Medicine

## 2013-06-19 ENCOUNTER — Other Ambulatory Visit: Payer: Self-pay | Admitting: Internal Medicine

## 2013-06-19 NOTE — Assessment & Plan Note (Signed)
She feels this is successful and worth continuing. There've been no reactions or problems.

## 2013-06-19 NOTE — Assessment & Plan Note (Signed)
Nonspecific exacerbation could be viral or allergic. Plan-prednisone a day taper, refill her nebulizer solution, medication discussion

## 2013-06-19 NOTE — Assessment & Plan Note (Signed)
Rate controlled 

## 2013-06-29 ENCOUNTER — Other Ambulatory Visit: Payer: Self-pay | Admitting: Internal Medicine

## 2013-06-30 ENCOUNTER — Ambulatory Visit: Payer: 59 | Admitting: Internal Medicine

## 2013-07-06 ENCOUNTER — Other Ambulatory Visit: Payer: Self-pay | Admitting: Internal Medicine

## 2013-07-17 ENCOUNTER — Telehealth: Payer: Self-pay | Admitting: Internal Medicine

## 2013-07-17 DIAGNOSIS — E7800 Pure hypercholesterolemia, unspecified: Secondary | ICD-10-CM

## 2013-07-17 DIAGNOSIS — E119 Type 2 diabetes mellitus without complications: Secondary | ICD-10-CM

## 2013-07-17 DIAGNOSIS — I6350 Cerebral infarction due to unspecified occlusion or stenosis of unspecified cerebral artery: Secondary | ICD-10-CM

## 2013-07-17 DIAGNOSIS — I10 Essential (primary) hypertension: Secondary | ICD-10-CM

## 2013-07-17 NOTE — Telephone Encounter (Signed)
Brooke Baker stopped by the office to request refill meds that have been called in by the pharmacy but has not been refilled. Pt completely out of Crestor 20 mg. Please call pt when rx is ready/msn

## 2013-07-17 NOTE — Telephone Encounter (Signed)
Please advise if she needs to be seen for OV or labs soon.

## 2013-07-18 ENCOUNTER — Telehealth: Payer: Self-pay | Admitting: Internal Medicine

## 2013-07-18 NOTE — Telephone Encounter (Signed)
Pt returned your call.  

## 2013-07-18 NOTE — Telephone Encounter (Signed)
Order placed for labs.  I have sent Brooke Baker a message to schedule pt for an appt.  Please have her come in for fasting labs within the next week.  Thanks.

## 2013-07-18 NOTE — Telephone Encounter (Signed)
LMTCB

## 2013-07-21 ENCOUNTER — Other Ambulatory Visit (INDEPENDENT_AMBULATORY_CARE_PROVIDER_SITE_OTHER): Payer: 59

## 2013-07-21 DIAGNOSIS — E7800 Pure hypercholesterolemia, unspecified: Secondary | ICD-10-CM

## 2013-07-21 DIAGNOSIS — E119 Type 2 diabetes mellitus without complications: Secondary | ICD-10-CM

## 2013-07-21 DIAGNOSIS — I6350 Cerebral infarction due to unspecified occlusion or stenosis of unspecified cerebral artery: Secondary | ICD-10-CM

## 2013-07-21 LAB — CBC WITH DIFFERENTIAL/PLATELET
BASOS ABS: 0 10*3/uL (ref 0.0–0.1)
Basophils Relative: 0.4 % (ref 0.0–3.0)
EOS ABS: 0.8 10*3/uL — AB (ref 0.0–0.7)
Eosinophils Relative: 12 % — ABNORMAL HIGH (ref 0.0–5.0)
HCT: 40.6 % (ref 36.0–46.0)
HEMOGLOBIN: 13.8 g/dL (ref 12.0–15.0)
LYMPHS PCT: 18.3 % (ref 12.0–46.0)
Lymphs Abs: 1.2 10*3/uL (ref 0.7–4.0)
MCHC: 34 g/dL (ref 30.0–36.0)
MCV: 102.6 fl — ABNORMAL HIGH (ref 78.0–100.0)
Monocytes Absolute: 0.9 10*3/uL (ref 0.1–1.0)
Monocytes Relative: 13.4 % — ABNORMAL HIGH (ref 3.0–12.0)
Neutro Abs: 3.7 10*3/uL (ref 1.4–7.7)
Neutrophils Relative %: 55.9 % (ref 43.0–77.0)
PLATELETS: 174 10*3/uL (ref 150.0–400.0)
RBC: 3.96 Mil/uL (ref 3.87–5.11)
RDW: 13.6 % (ref 11.5–15.5)
WBC: 6.7 10*3/uL (ref 4.0–10.5)

## 2013-07-21 LAB — BASIC METABOLIC PANEL
BUN: 24 mg/dL — ABNORMAL HIGH (ref 6–23)
CALCIUM: 9.3 mg/dL (ref 8.4–10.5)
CO2: 24 mEq/L (ref 19–32)
CREATININE: 1.1 mg/dL (ref 0.4–1.2)
Chloride: 101 mEq/L (ref 96–112)
GFR: 51.72 mL/min — ABNORMAL LOW (ref >60.00)
GLUCOSE: 128 mg/dL — AB (ref 70–99)
Potassium: 5 mEq/L (ref 3.5–5.1)
Sodium: 135 mEq/L (ref 135–145)

## 2013-07-21 LAB — LIPID PANEL
Cholesterol: 167 mg/dL (ref 0–200)
HDL: 63.1 mg/dL (ref >39.00)
LDL Cholesterol: 95 mg/dL (ref 0–99)
NonHDL: 103.9
Total CHOL/HDL Ratio: 3
Triglycerides: 43 mg/dL (ref 0.0–149.0)
VLDL: 8.6 mg/dL (ref 0.0–40.0)

## 2013-07-21 LAB — HEPATIC FUNCTION PANEL
ALBUMIN: 3.1 g/dL — AB (ref 3.5–5.2)
ALK PHOS: 102 U/L (ref 39–117)
ALT: 16 U/L (ref 0–35)
AST: 24 U/L (ref 0–37)
BILIRUBIN TOTAL: 0.6 mg/dL (ref 0.2–1.2)
Bilirubin, Direct: 0.2 mg/dL (ref 0.0–0.3)
Total Protein: 6.8 g/dL (ref 6.0–8.3)

## 2013-07-21 LAB — HEMOGLOBIN A1C: Hgb A1c MFr Bld: 5.8 % (ref 4.6–6.5)

## 2013-07-27 ENCOUNTER — Other Ambulatory Visit: Payer: Self-pay | Admitting: Internal Medicine

## 2013-07-27 NOTE — Telephone Encounter (Signed)
appt 07/31/13

## 2013-07-28 ENCOUNTER — Other Ambulatory Visit: Payer: Self-pay | Admitting: Internal Medicine

## 2013-07-28 ENCOUNTER — Other Ambulatory Visit: Payer: 59

## 2013-07-28 MED ORDER — ROSUVASTATIN CALCIUM 20 MG PO TABS: ORAL_TABLET | ORAL | Status: DC

## 2013-07-28 NOTE — Progress Notes (Signed)
rx sent in for crestor #30 with 2 refillls

## 2013-07-31 ENCOUNTER — Encounter: Payer: Self-pay | Admitting: Internal Medicine

## 2013-07-31 ENCOUNTER — Ambulatory Visit (INDEPENDENT_AMBULATORY_CARE_PROVIDER_SITE_OTHER): Payer: 59 | Admitting: Internal Medicine

## 2013-07-31 VITALS — BP 118/60 | HR 86 | Temp 98.3°F | Ht 66.0 in | Wt 248.2 lb

## 2013-07-31 DIAGNOSIS — I4891 Unspecified atrial fibrillation: Secondary | ICD-10-CM

## 2013-07-31 DIAGNOSIS — E1140 Type 2 diabetes mellitus with diabetic neuropathy, unspecified: Secondary | ICD-10-CM

## 2013-07-31 DIAGNOSIS — I10 Essential (primary) hypertension: Secondary | ICD-10-CM

## 2013-07-31 DIAGNOSIS — E669 Obesity, unspecified: Secondary | ICD-10-CM

## 2013-07-31 DIAGNOSIS — G8929 Other chronic pain: Secondary | ICD-10-CM

## 2013-07-31 DIAGNOSIS — I2510 Atherosclerotic heart disease of native coronary artery without angina pectoris: Secondary | ICD-10-CM

## 2013-07-31 DIAGNOSIS — G40909 Epilepsy, unspecified, not intractable, without status epilepticus: Secondary | ICD-10-CM

## 2013-07-31 DIAGNOSIS — E1142 Type 2 diabetes mellitus with diabetic polyneuropathy: Secondary | ICD-10-CM

## 2013-07-31 DIAGNOSIS — Z1239 Encounter for other screening for malignant neoplasm of breast: Secondary | ICD-10-CM

## 2013-07-31 DIAGNOSIS — M549 Dorsalgia, unspecified: Secondary | ICD-10-CM

## 2013-07-31 DIAGNOSIS — J209 Acute bronchitis, unspecified: Secondary | ICD-10-CM

## 2013-07-31 DIAGNOSIS — G609 Hereditary and idiopathic neuropathy, unspecified: Secondary | ICD-10-CM

## 2013-07-31 DIAGNOSIS — D7589 Other specified diseases of blood and blood-forming organs: Secondary | ICD-10-CM

## 2013-07-31 DIAGNOSIS — E7800 Pure hypercholesterolemia, unspecified: Secondary | ICD-10-CM

## 2013-07-31 DIAGNOSIS — G629 Polyneuropathy, unspecified: Secondary | ICD-10-CM

## 2013-07-31 DIAGNOSIS — I6350 Cerebral infarction due to unspecified occlusion or stenosis of unspecified cerebral artery: Secondary | ICD-10-CM

## 2013-07-31 DIAGNOSIS — E1149 Type 2 diabetes mellitus with other diabetic neurological complication: Secondary | ICD-10-CM

## 2013-07-31 DIAGNOSIS — Z0000 Encounter for general adult medical examination without abnormal findings: Secondary | ICD-10-CM

## 2013-07-31 LAB — CREATININE, SERUM: CREATININE: 1 mg/dL (ref 0.4–1.2)

## 2013-07-31 LAB — VITAMIN B12: VITAMIN B 12: 396 pg/mL (ref 211–911)

## 2013-07-31 MED ORDER — PREDNISONE 10 MG PO TABS: ORAL_TABLET | ORAL | Status: DC

## 2013-07-31 NOTE — Progress Notes (Signed)
Subjective:    Patient ID: Brooke Baker, female    DOB: 09-08-1934, 79 y.o.   MRN: [id number]  HPI 79 year old female with past history of COPD/asthma, hypertension, previous CVA, seizure disorder and known CAD.  She comes in today to follow up on these issues as well as for a complete physical exam.  Has noticed some increased cough and some congestion.  No fever.   Using her Symbicort bid.  Using ventilon more lately.  Still seeing Dr Annamaria Boots.   Back is also still an issue and limits her from standing for a long period of time.  She feels is stable.  Seeing Dr Saralyn Pilar.  Feels her heart is stable.  No nausea or vomiting.  Using miralax to keep her bowels stable.  She does report some increased burning in her feet.     Past Medical History  Diagnosis Date  . Hypertension   . Acute bronchitis   . CVA (cerebral vascular accident)   . CAD (coronary artery disease)     s/p stent LAD 8/03 and stent placement OM1  . Allergic rhinitis   . COPD with asthma   . Chronic back pain   . Seizure disorder   . Hypercholesterolemia   . Diabetes mellitus   . Arthritis     Current Outpatient Prescriptions on File Prior to Visit  Medication Sig Dispense Refill  . albuterol (PROVENTIL) (2.5 MG/3ML) 0.083% nebulizer solution Take 3 mLs (2.5 mg total) by nebulization every 4 (four) hours as needed for wheezing.  75 mL  prn  . albuterol (VENTOLIN HFA) 108 (90 BASE) MCG/ACT inhaler Inhale 2 puffs into the lungs every 4 (four) hours as needed for wheezing.  1 Inhaler  prn  . amLODipine (NORVASC) 10 MG tablet TAKE ONE (1) TABLET BY MOUTH EVERY DAY  30 tablet  2  . aspirin 81 MG tablet Take 81 mg by mouth daily.        . budesonide-formoterol (SYMBICORT) 80-4.5 MCG/ACT inhaler Inhale 2 puffs into the lungs 2 (two) times daily.  1 Inhaler  5  . dabigatran (PRADAXA) 150 MG CAPS Take 150 mg by mouth every 12 (twelve) hours.      [name] dextromethorphan-guaiFENesin (MUCINEX DM) 30-600 MG per 12 hr tablet Take 1  tablet by mouth every 12 (twelve) hours as needed.       [name] EPINEPHrine (EPIPEN) 0.3 mg/0.3 mL DEVI Inject 0.3 mLs (0.3 mg total) into the muscle once.  1 Device  prn  . fluocinonide (LIDEX) 0.05 % external solution Use as needed      . fluticasone (FLONASE) 50 MCG/ACT nasal spray USE 2 SPRAYS EACH NOSTRIL DAILY  16 g  1  . hydrochlorothiazide (HYDRODIURIL) 25 MG tablet TAKE ONE TABLET BY MOUTH EACH DAY. NEED OFFICE VISIT PRIOR TO FURTHERREFILLS. PLEASE CALL OFFICE TO SCHEDULE ASAP  30 tablet  3  . HYDROcodone-homatropine (HYDROMET) 5-1.5 MG/5ML syrup Take 5 mLs by mouth every 6 (six) hours as needed for cough.  240 mL  0  . levETIRAcetam (KEPPRA) 750 MG tablet Take 1 tablet (750 mg total) by mouth 2 (two) times daily.  60 tablet  5  . losartan (COZAAR) 100 MG tablet TAKE ONE (1) TABLET EACH DAY  30 tablet  2  . metoprolol succinate (TOPROL-XL) 50 MG 24 hr tablet TAKE ONE TABLET BY MOUTH EVERY NIGHT  30 tablet  0  . Omega-3 Fatty Acids (FISH OIL) 1000 MG CAPS Take 1 capsule by mouth 2 (two)  times daily.        . phenytoin (DILANTIN) 100 MG ER capsule Take 4 capsules (400 mg total) by mouth at bedtime.  120 capsule  5  . rosuvastatin (CRESTOR) 20 MG tablet Take 1 tablet by once a day  30 tablet  2  . sertraline (ZOLOFT) 50 MG tablet TAKE ONE (1) TABLET EACH DAY  30 tablet  0  . triamcinolone cream (KENALOG) 0.1 % Apply topically daily.       No current facility-administered medications on file prior to visit.    Review of Systems Patient denies any headache, lightheadedness or dizziness.  No significant sinus or allergy symptoms.   No chest pain, tightness or palpitations.  Some increased congestion and cough as outlined.   Using inhalers.  No acid reflux.  No nausea or vomiting.  No abdominal pain or cramping.  No bowel change, such as diarrhea or significant constipation.  Using miralax.  No BRBPR or melana.  No urine change.  Using her cane to ambulate.  Able to get around her house without  difficulty.  Burning in her feet as outlined.       Objective:   Physical Exam  Filed Vitals:   07/31/13 1141  BP: 118/60  Pulse: 86  Temp: 98.3 F (36.8 C)   Pulse recheck:  30  79 year old female in no acute distress.   HEENT:  Nares- clear.  Oropharynx - without lesions. NECK:  Supple.  Nontender.  No audible bruit.  HEART:  Appears to be regular. LUNGS:  No crackles or wheezing audible.  Respirations even and unlabored.  RADIAL PULSE:  Equal bilaterally.    BREASTS:  No nipple discharge or nipple retraction present.  Could not appreciate any distinct nodules or axillary adenopathy.  ABDOMEN:  Soft, nontender.  Bowel sounds present and normal.  No audible abdominal bruit.  GU:  Not performed.     EXTREMITIES:  No increased edema present.  DP pulses palpable and equal bilaterally.      SKIN:  No lesions.  NEURO:  Decreased sensation to pin prick distally and extending up to mid lower leg.             Assessment & Plan:  HEALTH MAINTENANCE.  Physical today.   Mammogram 04/07/12 - BiRADS I.   Schedule follow up mammogram.  Bone density 01/10/07 revealed improvement. Continue vitamin D.  Discussed with her regarding f/u bone density.  She wants to postpone.  Saw GI.  Declined colonoscopy.  See Dawson Bills note for details.     I spent 25 minutes with the patient and more than 50% of the time was spent in consultation regarding the above.

## 2013-07-31 NOTE — Progress Notes (Signed)
Pre visit review using our clinic review tool, if applicable. No additional management support is needed unless otherwise documented below in the visit note. 

## 2013-08-01 ENCOUNTER — Encounter: Payer: Self-pay | Admitting: Internal Medicine

## 2013-08-01 DIAGNOSIS — E669 Obesity, unspecified: Secondary | ICD-10-CM | POA: Insufficient documentation

## 2013-08-01 DIAGNOSIS — G629 Polyneuropathy, unspecified: Secondary | ICD-10-CM | POA: Insufficient documentation

## 2013-08-01 DIAGNOSIS — D7589 Other specified diseases of blood and blood-forming organs: Secondary | ICD-10-CM | POA: Insufficient documentation

## 2013-08-01 LAB — FOLATE RBC: RBC Folate: 883 ng/mL (ref >280)

## 2013-08-01 NOTE — Assessment & Plan Note (Signed)
Burning in her feet as outlined.  Some decreased sensation to pinprick on exam.  Discussed further treatment.  She declines at this time.  Follow.

## 2013-08-01 NOTE — Assessment & Plan Note (Signed)
Diet and exercise.  Follow.  

## 2013-08-01 NOTE — Assessment & Plan Note (Signed)
On dilantin and Keppra.  Followed by neurology.  Currently seizure free.  Follow.

## 2013-08-01 NOTE — Assessment & Plan Note (Signed)
Known disease. Continue risk factor modification.  Currently asymptomatic.

## 2013-08-01 NOTE — Assessment & Plan Note (Signed)
Low carb diet and weight loss.  Follow met b and a1c.  A1c checked 07/11/13 - 5.8.

## 2013-08-01 NOTE — Assessment & Plan Note (Signed)
On crestor.  Low cholesterol diet.  Follow lipid panel and liver function.  Cholesterol just checked - LDL 95.

## 2013-08-01 NOTE — Assessment & Plan Note (Signed)
Blood pressure under good control.  Same meds.  Follow metabolic panel.

## 2013-08-01 NOTE — Assessment & Plan Note (Signed)
Has had multiple back surgeries.  Chronic pain.  Follow.

## 2013-08-01 NOTE — Assessment & Plan Note (Signed)
No reoccurring symptoms.  Doing well.  Continue current med regimen.

## 2013-08-01 NOTE — Assessment & Plan Note (Signed)
Check B12 and rbc folate.   

## 2013-08-01 NOTE — Assessment & Plan Note (Signed)
On Pradaxa.  Rate controlled. She feels symptoms are stable.  Follow.  Keep follow up appt with Dr Saralyn Pilar.

## 2013-08-01 NOTE — Assessment & Plan Note (Signed)
Increased cough and congestion as outlined.  Do not feel abx warranted.  Prednisone taper as directed. Continue inhalers.  Notify me or be reevaluated if symptoms persist or worsen.

## 2013-08-02 ENCOUNTER — Encounter: Payer: Self-pay | Admitting: *Deleted

## 2013-08-03 ENCOUNTER — Encounter: Payer: Self-pay | Admitting: Neurology

## 2013-08-03 ENCOUNTER — Ambulatory Visit (INDEPENDENT_AMBULATORY_CARE_PROVIDER_SITE_OTHER): Payer: 59 | Admitting: Neurology

## 2013-08-03 VITALS — BP 141/81 | HR 80 | Wt 250.0 lb

## 2013-08-03 DIAGNOSIS — G40909 Epilepsy, unspecified, not intractable, without status epilepticus: Secondary | ICD-10-CM

## 2013-08-03 DIAGNOSIS — Z5181 Encounter for therapeutic drug level monitoring: Secondary | ICD-10-CM

## 2013-08-03 MED ORDER — PHENYTOIN SODIUM EXTENDED 100 MG PO CAPS: 400.0000 mg | ORAL_CAPSULE | Freq: Every day | ORAL | Status: DC

## 2013-08-03 MED ORDER — LEVETIRACETAM 750 MG PO TABS: 750.0000 mg | ORAL_TABLET | Freq: Two times a day (BID) | ORAL | Status: DC

## 2013-08-03 NOTE — Progress Notes (Signed)
Reason for visit: Seizures  Brooke Baker is an 79 y.o. female  History of present illness:  Brooke Baker is a 79 year old right-handed white female with a history of seizures. The patient has not had any seizures in a number of years, probably not since 2004. The patient indicates that since she went on a combination of Dilantin and Keppra, the seizures have essentially disappeared. The patient usually operates a motor vehicle, but she has been unable to drive since she fractured her femur, and she still requires a walker for ambulation. She has not had any further falls. She is on a combination of calcium and vitamin D supplementation currently. She is tolerating the medications fairly well, and she recently had blood work done through her primary care physician, but a dilantin level was not included. She returns for an evaluation at this time.  Past Medical History  Diagnosis Date  . Hypertension   . Acute bronchitis   . CVA (cerebral vascular accident)   . CAD (coronary artery disease)     s/p stent LAD 8/03 and stent placement OM1  . Allergic rhinitis   . COPD with asthma   . Chronic back pain   . Seizure disorder   . Hypercholesterolemia   . Diabetes mellitus   . Arthritis     Past Surgical History  Procedure Laterality Date  . Lumbar spine surgery      x3  . Total abdominal hysterectomy      appendectomy - age 49  . Total hip arthroplasty    . Total knee arthroplasty      2005  . Coronary stent placement  8/03    LAD  . Coronary stent placement      OM1  2006  . Femur fracture surgery Right [id number]    Dr. Marry Guan    Family History  Problem Relation Age of Onset  . Allergies Brother   . Cancer Brother   . Allergies Sister   . Cancer Sister     Non-hodgkins lymphoma  . Asthma Brother   . Asthma Sister   . Rheum arthritis Sister   . Rheum arthritis Brother   . Cancer Sister     non-hodgkins lymphoma  . Diabetes Brother   . Stroke Mother     Social  history:  reports that she has never smoked. She has never used smokeless tobacco. She reports that she does not drink alcohol or use illicit drugs.   No Known Allergies  Medications:  Current Outpatient Prescriptions on File Prior to Visit  Medication Sig Dispense Refill  . albuterol (PROVENTIL) (2.5 MG/3ML) 0.083% nebulizer solution Take 3 mLs (2.5 mg total) by nebulization every 4 (four) hours as needed for wheezing.  75 mL  prn  . albuterol (VENTOLIN HFA) 108 (90 BASE) MCG/ACT inhaler Inhale 2 puffs into the lungs every 4 (four) hours as needed for wheezing.  1 Inhaler  prn  . amLODipine (NORVASC) 10 MG tablet TAKE ONE (1) TABLET BY MOUTH EVERY DAY  30 tablet  2  . aspirin 81 MG tablet Take 81 mg by mouth daily.        . budesonide-formoterol (SYMBICORT) 80-4.5 MCG/ACT inhaler Inhale 2 puffs into the lungs 2 (two) times daily.  1 Inhaler  5  . dabigatran (PRADAXA) 150 MG CAPS Take 150 mg by mouth every 12 (twelve) hours.      [name] dextromethorphan-guaiFENesin (MUCINEX DM) 30-600 MG per 12 hr tablet Take 1 tablet by mouth every  12 (twelve) hours as needed.       [name] EPINEPHrine (EPIPEN) 0.3 mg/0.3 mL DEVI Inject 0.3 mLs (0.3 mg total) into the muscle once.  1 Device  prn  . fluticasone (FLONASE) 50 MCG/ACT nasal spray USE 2 SPRAYS EACH NOSTRIL DAILY  16 g  1  . hydrochlorothiazide (HYDRODIURIL) 25 MG tablet TAKE ONE TABLET BY MOUTH EACH DAY. NEED OFFICE VISIT PRIOR TO FURTHERREFILLS. PLEASE CALL OFFICE TO SCHEDULE ASAP  30 tablet  3  . losartan (COZAAR) 100 MG tablet TAKE ONE (1) TABLET EACH DAY  30 tablet  2  . metoprolol succinate (TOPROL-XL) 50 MG 24 hr tablet TAKE ONE TABLET BY MOUTH EVERY NIGHT  30 tablet  0  . Omega-3 Fatty Acids (FISH OIL) 1000 MG CAPS Take 1 capsule by mouth 2 (two) times daily.        . predniSONE (DELTASONE) 10 MG tablet Take 6 tablets x 1 day and then decrease by 1/2 tablet per day until down to zero mg.  39 tablet  0  . rosuvastatin (CRESTOR) 20 MG tablet Take 1 tablet  by once a day  30 tablet  2  . sertraline (ZOLOFT) 50 MG tablet TAKE ONE (1) TABLET EACH DAY  30 tablet  0  . triamcinolone cream (KENALOG) 0.1 % Apply topically daily.      . fluocinonide (LIDEX) 0.05 % external solution Use as needed       No current facility-administered medications on file prior to visit.    ROS:  Out of a complete 14 system review of symptoms, the patient complains only of the following symptoms, and all other reviewed systems are negative.  Leg swelling, palpitations of the heart, heart murmur Anxiety, depression heat intolerance  Blood pressure 141/81, pulse 80, weight 250 lb (113.399 kg), last menstrual period 08/17/1965.  Physical Exam  General: The patient is alert and cooperative at the time of the examination. The patient is markedly obese.  Skin: 3+ edema below the knees is noted bilaterally.   Neurologic Exam  Mental status: The patient is oriented x 3.  Cranial nerves: Facial symmetry is present. Speech is normal, no aphasia or dysarthria is noted. Extraocular movements are full. Visual fields are full.  Motor: The patient has good strength in all 4 extremities.  Sensory examination: Soft touch sensation is symmetric on the face, arms, or legs.  Coordination: The patient has good finger-nose-finger bilaterally. The patient has difficulty performing heel-to-shin on either side.  Gait and station: The patient has a wide-based gait. The patient walks with a walker. Tandem gait was not attempted. Romberg is negative. No drift is seen.  Reflexes: Deep tendon reflexes are symmetric.   Assessment/Plan:  1. History seizures, well controlled  The patient is on Dilantin and Keppra, she will continue these medications. Prescriptions were given today. She will have a Dilantin level drawn, and she will return in one year or as needed.  [name] 08/03/2013 7:33 PM  [facility] [address] [place], [place]  [address]  Phone [phone] Fax [phone]

## 2013-08-03 NOTE — Patient Instructions (Signed)

## 2013-08-04 LAB — PHENYTOIN LEVEL, TOTAL: Phenytoin Lvl: 12.1 ug/mL (ref 10.0–20.0)

## 2013-08-07 ENCOUNTER — Telehealth: Payer: Self-pay | Admitting: Nurse Practitioner

## 2013-08-07 NOTE — Telephone Encounter (Signed)
Patient returning missed call from [place], states it was regarding her bloodwork results, please return call to patient and advise.

## 2013-08-07 NOTE — Telephone Encounter (Signed)
Called patient, and relayed the same message that was given to patient on 08/04/13 at 8:46 am by Lyla Son, CMA, Dilantin level was good and no dosage changes at this time. Patient again verbalized understanding.

## 2013-08-10 NOTE — Telephone Encounter (Signed)
Noted  

## 2013-08-12 ENCOUNTER — Other Ambulatory Visit: Payer: Self-pay | Admitting: Internal Medicine

## 2013-08-25 ENCOUNTER — Other Ambulatory Visit: Payer: Self-pay | Admitting: Internal Medicine

## 2013-10-06 ENCOUNTER — Ambulatory Visit (INDEPENDENT_AMBULATORY_CARE_PROVIDER_SITE_OTHER): Payer: 59

## 2013-10-06 DIAGNOSIS — J309 Allergic rhinitis, unspecified: Secondary | ICD-10-CM

## 2013-10-11 ENCOUNTER — Other Ambulatory Visit: Payer: Self-pay | Admitting: Internal Medicine

## 2013-10-16 ENCOUNTER — Other Ambulatory Visit: Payer: Self-pay | Admitting: Internal Medicine

## 2013-10-27 ENCOUNTER — Other Ambulatory Visit: Payer: Self-pay | Admitting: Internal Medicine

## 2013-11-09 ENCOUNTER — Encounter: Payer: Self-pay | Admitting: Internal Medicine

## 2013-11-09 ENCOUNTER — Other Ambulatory Visit: Payer: Self-pay | Admitting: Internal Medicine

## 2013-11-27 ENCOUNTER — Telehealth: Payer: Self-pay | Admitting: Internal Medicine

## 2013-11-27 NOTE — Telephone Encounter (Signed)
I can see pt on Wednesday 11/29/13 (block 30 min) - make sure pt ok with waiting until Wednesday.  Let me know if any problem.

## 2013-11-27 NOTE — Telephone Encounter (Signed)
Spoke with daughter [name]) she states that her mother is going through a lot right now with issues with her son. She is crying a lot, not sleeping, & just real upset & looks like she is in [place]. Wants to know if her Zoloft could be increased a little. Please advise.

## 2013-11-27 NOTE — Telephone Encounter (Signed)
Pt daughter request to be call about pt meds/msn

## 2013-11-29 ENCOUNTER — Ambulatory Visit (INDEPENDENT_AMBULATORY_CARE_PROVIDER_SITE_OTHER): Payer: 59 | Admitting: Internal Medicine

## 2013-11-29 ENCOUNTER — Encounter: Payer: Self-pay | Admitting: Internal Medicine

## 2013-11-29 ENCOUNTER — Other Ambulatory Visit: Payer: 59

## 2013-11-29 DIAGNOSIS — I4891 Unspecified atrial fibrillation: Secondary | ICD-10-CM

## 2013-11-29 DIAGNOSIS — Z658 Other specified problems related to psychosocial circumstances: Secondary | ICD-10-CM

## 2013-11-29 DIAGNOSIS — E1140 Type 2 diabetes mellitus with diabetic neuropathy, unspecified: Secondary | ICD-10-CM

## 2013-11-29 DIAGNOSIS — I10 Essential (primary) hypertension: Secondary | ICD-10-CM

## 2013-11-29 DIAGNOSIS — J069 Acute upper respiratory infection, unspecified: Secondary | ICD-10-CM

## 2013-11-29 DIAGNOSIS — F439 Reaction to severe stress, unspecified: Secondary | ICD-10-CM

## 2013-11-29 MED ORDER — PREDNISONE 10 MG PO TABS: ORAL_TABLET | ORAL | Status: DC

## 2013-11-29 MED ORDER — SERTRALINE HCL 50 MG PO TABS: ORAL_TABLET | ORAL | Status: DC

## 2013-11-29 MED ORDER — CEFUROXIME AXETIL 250 MG PO TABS: 250.0000 mg | ORAL_TABLET | Freq: Two times a day (BID) | ORAL | Status: DC

## 2013-11-29 NOTE — Progress Notes (Signed)
Pre visit review using our clinic review tool, if applicable. No additional management support is needed unless otherwise documented below in the visit note. 

## 2013-12-01 ENCOUNTER — Ambulatory Visit: Payer: 59 | Admitting: Internal Medicine

## 2013-12-03 DIAGNOSIS — J069 Acute upper respiratory infection, unspecified: Secondary | ICD-10-CM | POA: Insufficient documentation

## 2013-12-03 DIAGNOSIS — F439 Reaction to severe stress, unspecified: Secondary | ICD-10-CM | POA: Insufficient documentation

## 2013-12-03 NOTE — Progress Notes (Signed)
Subjective:    Patient ID: Brooke Baker, female    DOB: [birth year], 79 y.o.   MRN: [id number]  HPI 79 year old female with past history of COPD/asthma, hypertension, previous CVA, seizure disorder and known CAD.  She comes in today as a work in with concerns regarding increased stress.  She is accompanied by her husband.  history obtained from both of them.  Increased stress related to family issues with her son.  Discussed at length.  No suicidal ideations.  Sleeping ok.  Feels she may need to increase her zoloft.  Does not feel she needs counseling or psych referral.  Also has noticed some increased cough and some congestion.  No fever.  Some wheezing.   Using her Symbicort bid.  Using ventilon more lately.  Still seeing Dr Annamaria Boots.   But here today with flare.  Feels her heart is stable.      Past Medical History  Diagnosis Date  . Hypertension   . Acute bronchitis   . CVA (cerebral vascular accident)   . CAD (coronary artery disease)     s/p stent LAD 8/03 and stent placement OM1  . Allergic rhinitis   . COPD with asthma   . Chronic back pain   . Seizure disorder   . Hypercholesterolemia   . Diabetes mellitus   . Arthritis     Current Outpatient Prescriptions on File Prior to Visit  Medication Sig Dispense Refill  . albuterol (PROVENTIL) (2.5 MG/3ML) 0.083% nebulizer solution Take 3 mLs (2.5 mg total) by nebulization every 4 (four) hours as needed for wheezing. 75 mL prn  . albuterol (VENTOLIN HFA) 108 (90 BASE) MCG/ACT inhaler Inhale 2 puffs into the lungs every 4 (four) hours as needed for wheezing. 1 Inhaler prn  . amLODipine (NORVASC) 10 MG tablet TAKE ONE (1) TABLET EACH DAY 30 tablet 3  . aspirin 81 MG tablet Take 81 mg by mouth daily.      . budesonide-formoterol (SYMBICORT) 80-4.5 MCG/ACT inhaler Inhale 2 puffs into the lungs 2 (two) times daily. 1 Inhaler 5  . CRESTOR 20 MG tablet TAKE ONE (1) TABLET EACH DAY 30 tablet 5  . dabigatran (PRADAXA) 150 MG CAPS Take 150 mg  by mouth every 12 (twelve) hours.    [name] EPINEPHrine (EPIPEN) 0.3 mg/0.3 mL DEVI Inject 0.3 mLs (0.3 mg total) into the muscle once. 1 Device prn  . fluocinonide (LIDEX) 0.05 % external solution Use as needed    . fluticasone (FLONASE) 50 MCG/ACT nasal spray USE 2 SPRAYS EACH NOSTRIL DAILY 16 g 1  . hydrochlorothiazide (HYDRODIURIL) 25 MG tablet Take 1 tablet (25 mg total) by mouth daily. 30 tablet 5  . levETIRAcetam (KEPPRA) 750 MG tablet Take 1 tablet (750 mg total) by mouth 2 (two) times daily. 60 tablet 5  . losartan (COZAAR) 100 MG tablet TAKE ONE (1) TABLET EACH DAY 30 tablet 8  . metoprolol succinate (TOPROL-XL) 50 MG 24 hr tablet Take 1 tablet (50 mg total) by mouth daily. Take one tablet by mouth every night. 30 tablet 6  . Omega-3 Fatty Acids (FISH OIL) 1000 MG CAPS Take 1 capsule by mouth 2 (two) times daily.      . phenytoin (DILANTIN) 100 MG ER capsule Take 4 capsules (400 mg total) by mouth at bedtime. 120 capsule 5  . polyethylene glycol powder (GLYCOLAX/MIRALAX) powder Take 17 g by mouth at bedtime.    . triamcinolone cream (KENALOG) 0.1 % Apply topically daily.  No current facility-administered medications on file prior to visit.    Review of Systems Patient denies any headache, lightheadedness or dizziness.  No significant sinus or allergy symptoms.   No chest pain, tightness or palpitations.  Some increased congestion and cough as outlined.  Some wheezing.  Using inhalers.  No increased sob.  No acid reflux.  No nausea or vomiting.  No abdominal pain or cramping.   Increased stress as outlined.       Objective:   Physical Exam  Filed Vitals:   11/29/13 1007  BP: 110/70  Pulse: 86  Temp: 98 F (36.7 C)   Blood pressure recheck:  8/31  79 year old female in no acute distress.   HEENT:  Nares- clear.  Oropharynx - without lesions. NECK:  Supple.  Nontender.  No audible bruit.  HEART:  Appears to be regular. LUNGS:  No crackles or wheezing audible.  Respirations  even and unlabored.  RADIAL PULSE:  Equal bilaterally.   ABDOMEN:  Soft, nontender.  Bowel sounds present and normal.  No audible abdominal bruit.     EXTREMITIES:  No increased edema present.  DP pulses palpable and equal bilaterally.                  Assessment & Plan:  1. Severe obesity (BMI >= 40) Have discussed diet and exercise.    2. Essential hypertension Blood pressure under reasonable control.  Follow.  Treat with stress.    3. Atrial fibrillation, unspecified Rate controlled.  On pradaxa.    4. Type 2 diabetes mellitus with diabetic neuropathy Has been under good control.   Lab Results  Component Value Date   HGBA1C 5.8 07/21/2013   5. Stress Increased stress as outlined.  Discussed at length with her and her husband today.  Discussed counseling and referral to psych.  She declines.  Has good support.  Will increase zoloft to 75mg  q day.  Follow.    6. URI (upper respiratory infection) Cough and congestion as outlined.  Treat with ceftin as directed.  Prednisone taper as directed.  Continue inhalers and robitussin as directed.  Follow.    HEALTH MAINTENANCE.  Physical 07/31/13.   Mammogram 04/07/12 - BiRADS I.   Scheduled follow up mammogram.  Do not have results.  Need.  Bone density 01/10/07 revealed improvement. Continue vitamin D.  Have previously discussed with her regarding f/u bone density.  She has wanted to postpone.  Saw GI.  Declined colonoscopy.  See Dawson Bills note for details.     I spent 25 minutes with the patient and more than 50% of the time was spent in consultation regarding the above.

## 2013-12-18 ENCOUNTER — Ambulatory Visit (INDEPENDENT_AMBULATORY_CARE_PROVIDER_SITE_OTHER): Payer: 59 | Admitting: Family Medicine

## 2013-12-18 ENCOUNTER — Encounter: Payer: Self-pay | Admitting: Family Medicine

## 2013-12-18 VITALS — BP 136/70 | HR 77 | Temp 98.0°F | Wt 252.5 lb

## 2013-12-18 DIAGNOSIS — L03116 Cellulitis of left lower limb: Secondary | ICD-10-CM | POA: Insufficient documentation

## 2013-12-18 MED ORDER — DOXYCYCLINE HYCLATE 100 MG PO TABS: 100.0000 mg | ORAL_TABLET | Freq: Two times a day (BID) | ORAL | Status: DC

## 2013-12-18 NOTE — Assessment & Plan Note (Signed)
New- ? She bumped her leg and was not aware. Does not seem consistent at this point with DVT. Just finished course of Ceftin so will add doxycyline for MRSA coverage. Weeping likely due more to body habitus/venous insufficiency. Has follow up with PCP scheduled. Advised to be seen sooner if symptoms change or worsen. The patient indicates understanding of these issues and agrees with the plan.

## 2013-12-18 NOTE — Patient Instructions (Signed)
Nice to meet you. Happy Holidays. Please take doxycycline as directed- 1 tab twice daily x 10 days. Follow up your doctor as scheduled.

## 2013-12-18 NOTE — Progress Notes (Signed)
Subjective:   Patient ID: Brooke Baker, female    DOB: 12-May-1934, 79 y.o.   MRN: [id number]  Brooke Baker is a pleasant 79 y.o. year old very complicated female pt of Dr. Nicki Reaper, new to me,  presents to clinic today with her husband for Leg Pain  on 12/18/2013  HPI: She has history of HTN, obesity, A fib (on anticoagulation), DM 2, COPD/asthma (followed by Dr. Annamaria Boots), hypertension, previous CVA, seizure disorder and known CAD.  Last saw her PCP on 11/29/13- note reviewed.  No known injury to her leg.  Noticed that her left lower leg was weeping clear fluid this weekend and was becoming warm and red to touch.  No longer leaking fluid but area as still warm and red. Has never had anything like this before.  Just finished course of ceftin for respiratory illness.  Current Outpatient Prescriptions on File Prior to Visit  Medication Sig Dispense Refill  . albuterol (PROVENTIL) (2.5 MG/3ML) 0.083% nebulizer solution Take 3 mLs (2.5 mg total) by nebulization every 4 (four) hours as needed for wheezing. 75 mL prn  . albuterol (VENTOLIN HFA) 108 (90 BASE) MCG/ACT inhaler Inhale 2 puffs into the lungs every 4 (four) hours as needed for wheezing. 1 Inhaler prn  . amLODipine (NORVASC) 10 MG tablet TAKE ONE (1) TABLET EACH DAY 30 tablet 3  . aspirin 81 MG tablet Take 81 mg by mouth daily.      . budesonide-formoterol (SYMBICORT) 80-4.5 MCG/ACT inhaler Inhale 2 puffs into the lungs 2 (two) times daily. 1 Inhaler 5  . CRESTOR 20 MG tablet TAKE ONE (1) TABLET EACH DAY 30 tablet 5  . dabigatran (PRADAXA) 150 MG CAPS Take 150 mg by mouth every 12 (twelve) hours.    [name] EPINEPHrine (EPIPEN) 0.3 mg/0.3 mL DEVI Inject 0.3 mLs (0.3 mg total) into the muscle once. 1 Device prn  . fluocinonide (LIDEX) 0.05 % external solution Use as needed    . fluticasone (FLONASE) 50 MCG/ACT nasal spray USE 2 SPRAYS EACH NOSTRIL DAILY 16 g 1  . hydrochlorothiazide (HYDRODIURIL) 25 MG tablet Take 1 tablet (25 mg total)  by mouth daily. 30 tablet 5  . levETIRAcetam (KEPPRA) 750 MG tablet Take 1 tablet (750 mg total) by mouth 2 (two) times daily. 60 tablet 5  . losartan (COZAAR) 100 MG tablet TAKE ONE (1) TABLET EACH DAY 30 tablet 8  . metoprolol succinate (TOPROL-XL) 50 MG 24 hr tablet Take 1 tablet (50 mg total) by mouth daily. Take one tablet by mouth every night. 30 tablet 6  . Omega-3 Fatty Acids (FISH OIL) 1000 MG CAPS Take 1 capsule by mouth 2 (two) times daily.      . phenytoin (DILANTIN) 100 MG ER capsule Take 4 capsules (400 mg total) by mouth at bedtime. 120 capsule 5  . polyethylene glycol powder (GLYCOLAX/MIRALAX) powder Take 17 g by mouth at bedtime.    . sertraline (ZOLOFT) 50 MG tablet Take 1/1/2 tablet q day 45 tablet 2  . triamcinolone cream (KENALOG) 0.1 % Apply topically daily.     No current facility-administered medications on file prior to visit.    No Known Allergies  Past Medical History  Diagnosis Date  . Hypertension   . Acute bronchitis   . CVA (cerebral vascular accident)   . CAD (coronary artery disease)     s/p stent LAD 8/03 and stent placement OM1  . Allergic rhinitis   . COPD with asthma   . Chronic back  pain   . Seizure disorder   . Hypercholesterolemia   . Diabetes mellitus   . Arthritis     Past Surgical History  Procedure Laterality Date  . Lumbar spine surgery      x3  . Total abdominal hysterectomy      appendectomy - age 79  . Total hip arthroplasty    . Total knee arthroplasty      2005  . Coronary stent placement  8/03    LAD  . Coronary stent placement      OM1  2006  . Femur fracture surgery Right [id number]    Dr. Marry Guan    Family History  Problem Relation Age of Onset  . Allergies Brother   . Cancer Brother   . Allergies Sister   . Cancer Sister     Non-hodgkins lymphoma  . Asthma Brother   . Asthma Sister   . Rheum arthritis Sister   . Rheum arthritis Brother   . Cancer Sister     non-hodgkins lymphoma  . Diabetes Brother   .  Stroke Mother     History   Social History  . Marital Status: Married    Spouse Name: N/A    Number of Children: 3  . Years of Education: hs   Occupational History  . retired [employer] logistics/support/administrative officer   .     Social History Main Topics  . Smoking status: Never Smoker   . Smokeless tobacco: Never Used  . Alcohol Use: No  . Drug Use: No  . Sexual Activity: Not on file   Other Topics Concern  . Not on file   Social History Narrative   The PMH, PSH, Social History, Family History, Medications, and allergies have been reviewed in [facility], and have been updated if relevant.  Review of Systems  Constitutional: Negative for fever.  Musculoskeletal: Negative.   Skin: Positive for color change and wound.  Neurological: Negative.   Hematological: Negative for adenopathy.  Psychiatric/Behavioral: Negative.   All other systems reviewed and are negative.      Objective:    BP 136/70 mmHg  Pulse 77  Temp(Src) 98 F (36.7 C) (Oral)  Wt 252 lb 8 oz (114.533 kg)  SpO2 97%  LMP 08/17/1965   Physical Exam  Constitutional: She is oriented to person, place, and time. She appears well-developed and well-nourished. No distress.  Morbidly obese  HENT:  Head: Normocephalic.  Eyes: Conjunctivae are normal.  Musculoskeletal: She exhibits tenderness. She exhibits no edema.  Neurological: She is alert and oriented to person, place, and time. No cranial nerve deficit.  Skin:  Thick ankles bilaterally, no pitting Open area on lower left leg- anterior- warm and red, TTP, no cords or knots palpable  Psychiatric: She has a normal mood and affect. Her behavior is normal. Judgment and thought content normal.  Nursing note and vitals reviewed.         Assessment & Plan:   No diagnosis found. No Follow-up on file.

## 2013-12-18 NOTE — Progress Notes (Signed)
Pre visit review using our clinic review tool, if applicable. No additional management support is needed unless otherwise documented below in the visit note. 

## 2013-12-26 ENCOUNTER — Other Ambulatory Visit: Payer: 59

## 2013-12-26 ENCOUNTER — Other Ambulatory Visit (INDEPENDENT_AMBULATORY_CARE_PROVIDER_SITE_OTHER): Payer: 59

## 2013-12-26 DIAGNOSIS — E1140 Type 2 diabetes mellitus with diabetic neuropathy, unspecified: Secondary | ICD-10-CM

## 2013-12-26 DIAGNOSIS — E7800 Pure hypercholesterolemia, unspecified: Secondary | ICD-10-CM

## 2013-12-26 DIAGNOSIS — D7589 Other specified diseases of blood and blood-forming organs: Secondary | ICD-10-CM

## 2013-12-26 LAB — HEPATIC FUNCTION PANEL
ALK PHOS: 112 U/L (ref 39–117)
ALT: 15 U/L (ref 0–35)
AST: 26 U/L (ref 0–37)
Albumin: 2.9 g/dL — ABNORMAL LOW (ref 3.5–5.2)
Bilirubin, Direct: 0.2 mg/dL (ref 0.0–0.3)
TOTAL PROTEIN: 6.5 g/dL (ref 6.0–8.3)
Total Bilirubin: 0.8 mg/dL (ref 0.2–1.2)

## 2013-12-26 LAB — CBC WITH DIFFERENTIAL/PLATELET
Basophils Absolute: 0 10*3/uL (ref 0.0–0.1)
Basophils Relative: 0.4 % (ref 0.0–3.0)
EOS ABS: 0.7 10*3/uL (ref 0.0–0.7)
Eosinophils Relative: 11.3 % — ABNORMAL HIGH (ref 0.0–5.0)
HCT: 41.5 % (ref 36.0–46.0)
Hemoglobin: 13.6 g/dL (ref 12.0–15.0)
LYMPHS PCT: 29.3 % (ref 12.0–46.0)
Lymphs Abs: 1.7 10*3/uL (ref 0.7–4.0)
MCHC: 32.8 g/dL (ref 30.0–36.0)
MCV: 101.7 fl — ABNORMAL HIGH (ref 78.0–100.0)
MONO ABS: 0.9 10*3/uL (ref 0.1–1.0)
Monocytes Relative: 14.7 % — ABNORMAL HIGH (ref 3.0–12.0)
NEUTROS PCT: 44.3 % (ref 43.0–77.0)
Neutro Abs: 2.6 10*3/uL (ref 1.4–7.7)
Platelets: 178 10*3/uL (ref 150.0–400.0)
RBC: 4.08 Mil/uL (ref 3.87–5.11)
RDW: 13.1 % (ref 11.5–15.5)
WBC: 5.8 10*3/uL (ref 4.0–10.5)

## 2013-12-26 LAB — BASIC METABOLIC PANEL
BUN: 38 mg/dL — AB (ref 6–23)
CHLORIDE: 109 meq/L (ref 96–112)
CO2: 23 meq/L (ref 19–32)
CREATININE: 1.2 mg/dL (ref 0.4–1.2)
Calcium: 8.8 mg/dL (ref 8.4–10.5)
GFR: 47.61 mL/min — ABNORMAL LOW (ref >60.00)
Glucose, Bld: 144 mg/dL — ABNORMAL HIGH (ref 70–99)
Potassium: 4.9 mEq/L (ref 3.5–5.1)
Sodium: 138 mEq/L (ref 135–145)

## 2013-12-26 LAB — LIPID PANEL
Cholesterol: 127 mg/dL (ref 0–200)
HDL: 48.6 mg/dL (ref >39.00)
LDL Cholesterol: 65 mg/dL (ref 0–99)
NONHDL: 78.4
TRIGLYCERIDES: 65 mg/dL (ref 0.0–149.0)
Total CHOL/HDL Ratio: 3
VLDL: 13 mg/dL (ref 0.0–40.0)

## 2013-12-26 LAB — HEMOGLOBIN A1C: Hgb A1c MFr Bld: 6.1 % (ref 4.6–6.5)

## 2013-12-28 ENCOUNTER — Ambulatory Visit (INDEPENDENT_AMBULATORY_CARE_PROVIDER_SITE_OTHER): Payer: 59 | Admitting: Internal Medicine

## 2013-12-28 ENCOUNTER — Encounter: Payer: Self-pay | Admitting: Internal Medicine

## 2013-12-28 VITALS — BP 120/70 | HR 97 | Temp 98.4°F | Ht 66.0 in | Wt 254.0 lb

## 2013-12-28 DIAGNOSIS — I4891 Unspecified atrial fibrillation: Secondary | ICD-10-CM

## 2013-12-28 DIAGNOSIS — E7800 Pure hypercholesterolemia, unspecified: Secondary | ICD-10-CM

## 2013-12-28 DIAGNOSIS — I2510 Atherosclerotic heart disease of native coronary artery without angina pectoris: Secondary | ICD-10-CM

## 2013-12-28 DIAGNOSIS — E1140 Type 2 diabetes mellitus with diabetic neuropathy, unspecified: Secondary | ICD-10-CM

## 2013-12-28 DIAGNOSIS — G40909 Epilepsy, unspecified, not intractable, without status epilepticus: Secondary | ICD-10-CM

## 2013-12-28 DIAGNOSIS — G8929 Other chronic pain: Secondary | ICD-10-CM

## 2013-12-28 DIAGNOSIS — J209 Acute bronchitis, unspecified: Secondary | ICD-10-CM

## 2013-12-28 DIAGNOSIS — I639 Cerebral infarction, unspecified: Secondary | ICD-10-CM

## 2013-12-28 DIAGNOSIS — N289 Disorder of kidney and ureter, unspecified: Secondary | ICD-10-CM

## 2013-12-28 DIAGNOSIS — L03116 Cellulitis of left lower limb: Secondary | ICD-10-CM

## 2013-12-28 DIAGNOSIS — I10 Essential (primary) hypertension: Secondary | ICD-10-CM

## 2013-12-28 DIAGNOSIS — M549 Dorsalgia, unspecified: Secondary | ICD-10-CM

## 2013-12-28 DIAGNOSIS — Z658 Other specified problems related to psychosocial circumstances: Secondary | ICD-10-CM

## 2013-12-28 DIAGNOSIS — F439 Reaction to severe stress, unspecified: Secondary | ICD-10-CM

## 2013-12-28 LAB — BASIC METABOLIC PANEL
BUN: 24 mg/dL — AB (ref 6–23)
CO2: 21 meq/L (ref 19–32)
CREATININE: 1 mg/dL (ref 0.4–1.2)
Calcium: 8.8 mg/dL (ref 8.4–10.5)
Chloride: 110 mEq/L (ref 96–112)
GFR: 57.07 mL/min — ABNORMAL LOW (ref >60.00)
GLUCOSE: 133 mg/dL — AB (ref 70–99)
Potassium: 4.4 mEq/L (ref 3.5–5.1)
Sodium: 140 mEq/L (ref 135–145)

## 2013-12-28 MED ORDER — PREDNISONE 10 MG PO TABS: ORAL_TABLET | ORAL | Status: DC

## 2013-12-28 MED ORDER — MUPIROCIN 2 % EX OINT: TOPICAL_OINTMENT | CUTANEOUS | Status: DC

## 2013-12-28 NOTE — Progress Notes (Signed)
Pre visit review using our clinic review tool, if applicable. No additional management support is needed unless otherwise documented below in the visit note. 

## 2014-01-02 ENCOUNTER — Encounter: Payer: Self-pay | Admitting: *Deleted

## 2014-01-05 ENCOUNTER — Encounter: Payer: Self-pay | Admitting: Internal Medicine

## 2014-01-05 NOTE — Progress Notes (Signed)
Subjective:    Patient ID: Brooke Baker, female    DOB: 1934-08-21, 80 y.o.   MRN: [id number]  HPI 80 year old female with past history of COPD/asthma, hypertension, previous CVA, seizure disorder and known CAD.  She comes in today for a scheduled follow up.  She was seen by Dr Deborra Medina on 12/18/13 and diagnosed with cellulitis.  See her note for details.  Placed on doxycycline.  Took for over one week.  Made her sick on her stomach.  Stopped the abx a couple of days ago.  Symptoms resolved.   Has noticed some increased cough and some congestion.  The previous flare resolved.  Now has reoccurred.  No increased congestion.  No fever.   Using her Symbicort bid.  Using ventilon as needed.  Still seeing Dr Annamaria Boots.   Back is also still an issue and limits her from standing for a long period of time.  She feels is stable.  Seeing Dr Saralyn Pilar.  Feels her heart is stable.  No nausea or vomiting.  Slightly increased cr on recent lab check.  Discussed the importance of staying hydrated.   Handling stress relatively well.      Past Medical History  Diagnosis Date  . Hypertension   . Acute bronchitis   . CVA (cerebral vascular accident)   . CAD (coronary artery disease)     s/p stent LAD 8/03 and stent placement OM1  . Allergic rhinitis   . COPD with asthma   . Chronic back pain   . Seizure disorder   . Hypercholesterolemia   . Diabetes mellitus   . Arthritis     Current Outpatient Prescriptions on File Prior to Visit  Medication Sig Dispense Refill  . albuterol (PROVENTIL) (2.5 MG/3ML) 0.083% nebulizer solution Take 3 mLs (2.5 mg total) by nebulization every 4 (four) hours as needed for wheezing. 75 mL prn  . albuterol (VENTOLIN HFA) 108 (90 BASE) MCG/ACT inhaler Inhale 2 puffs into the lungs every 4 (four) hours as needed for wheezing. 1 Inhaler prn  . amLODipine (NORVASC) 10 MG tablet TAKE ONE (1) TABLET EACH DAY 30 tablet 3  . aspirin 81 MG tablet Take 81 mg by mouth daily.      .  budesonide-formoterol (SYMBICORT) 80-4.5 MCG/ACT inhaler Inhale 2 puffs into the lungs 2 (two) times daily. 1 Inhaler 5  . CRESTOR 20 MG tablet TAKE ONE (1) TABLET EACH DAY 30 tablet 5  . dabigatran (PRADAXA) 150 MG CAPS Take 150 mg by mouth every 12 (twelve) hours.    [name] EPINEPHrine (EPIPEN) 0.3 mg/0.3 mL DEVI Inject 0.3 mLs (0.3 mg total) into the muscle once. 1 Device prn  . fluocinonide (LIDEX) 0.05 % external solution Use as needed    . fluticasone (FLONASE) 50 MCG/ACT nasal spray USE 2 SPRAYS EACH NOSTRIL DAILY 16 g 1  . hydrochlorothiazide (HYDRODIURIL) 25 MG tablet Take 1 tablet (25 mg total) by mouth daily. 30 tablet 5  . levETIRAcetam (KEPPRA) 750 MG tablet Take 1 tablet (750 mg total) by mouth 2 (two) times daily. 60 tablet 5  . losartan (COZAAR) 100 MG tablet TAKE ONE (1) TABLET EACH DAY 30 tablet 8  . metoprolol succinate (TOPROL-XL) 50 MG 24 hr tablet Take 1 tablet (50 mg total) by mouth daily. Take one tablet by mouth every night. 30 tablet 6  . Omega-3 Fatty Acids (FISH OIL) 1000 MG CAPS Take 1 capsule by mouth 2 (two) times daily.      . phenytoin (  DILANTIN) 100 MG ER capsule Take 4 capsules (400 mg total) by mouth at bedtime. 120 capsule 5  . polyethylene glycol powder (GLYCOLAX/MIRALAX) powder Take 17 g by mouth at bedtime.    . sertraline (ZOLOFT) 50 MG tablet Take 1/1/2 tablet q day 45 tablet 2  . triamcinolone cream (KENALOG) 0.1 % Apply topically daily.     No current facility-administered medications on file prior to visit.    Review of Systems Patient denies any headache, lightheadedness or dizziness.  No significant sinus or allergy symptoms.   No chest pain, tightness or palpitations.  Some increased cough as outlined.   Using inhalers.  No acid reflux.  No nausea or vomiting.  No abdominal pain or cramping.  No bowel change, such as diarrhea or significant constipation.  No BRBPR or melana.  No urine change.  Off doxycycline.  Leg better.  Discussed importance of  exercise.        Objective:   Physical Exam  Filed Vitals:   12/28/13 1110  BP: 120/70  Pulse: 97  Temp: 98.4 F (68.59 C)   80 year old female in no acute distress.   HEENT:  Nares- clear.  Oropharynx - without lesions. NECK:  Supple.  Nontender.  No audible bruit.  HEART:  Appears to be regular. LUNGS:  No crackles or wheezing audible.  Respirations even and unlabored.  RADIAL PULSE:  Equal bilaterally. ABDOMEN:  Soft, nontender.  Bowel sounds present and normal.  No audible abdominal bruit.    EXTREMITIES:  No increased edema present.  Stable.  DP pulses palpable and equal bilaterally.   Small lesion lower leg.  (redness better per pt report).  NEURO:  Decreased sensation to pin prick distally and extending up to mid lower leg.             Assessment & Plan:  1. Renal insufficiency Stay hydrated.   - Basic metabolic panel  2. Atherosclerosis of native coronary artery of native heart without angina pectoris Stable.  Continue f/u with cardiology.   3. CVA (cerebral vascular accident) No reoccurring problems.  Stable on current medications.    4. Essential hypertension Blood pressure doing well.  Same medications.  Follow.   5. Atrial fibrillation, unspecified Rate controlled.  On predaxa.    6. Acute bronchitis, unspecified organism Increased cough and wheezing as outlined.  Do not feel abx warranted.  Continue inhalers.  Prednisone taper as directed.  Follow.  Continue f/u with Dr Annamaria Boots.    7. Type 2 diabetes mellitus with diabetic neuropathy Low carb diet and exercise.  Follow.   Lab Results  Component Value Date   HGBA1C 6.1 12/26/2013   8. Seizure disorder On keppra.  No seizures.  Continue f/u with neurology.   9. Chronic back pain Stable.    10. Hypercholesterolemia Low cholesterol diet and exercise.  Continue on crestor.  Follow lipid panel and liver function.  Lab Results  Component Value Date   CHOL 127 12/26/2013   HDL 48.60 12/26/2013   LDLCALC  65 12/26/2013   TRIG 65.0 12/26/2013   CHOLHDL 3 12/26/2013   11. Severe obesity (BMI >= 40) Diet and exercise.    12. Stress Handling stress relatively well.  Doing well on increased dose of zoloft.  Follow.    13. Cellulitis of leg, left Improved.  Off doxycycline.  Bactroban as directed.  Follow.    HEALTH MAINTENANCE.  Physical 07/31/13.   Mammogram 04/07/12 - BiRADS I.   Follow up  mammogram scheduled last visit.  Needs.    Bone density 01/10/07 revealed improvement. Continue vitamin D.  Have discussed with her regarding f/u bone density.  She wants to postpone.  Saw GI.  Declined colonoscopy.  See Dawson Bills note for details.     I spent 25 minutes with the patient and more than 50% of the time was spent in consultation regarding the above.

## 2014-02-15 ENCOUNTER — Other Ambulatory Visit: Payer: Self-pay | Admitting: Internal Medicine

## 2014-02-27 ENCOUNTER — Other Ambulatory Visit: Payer: Self-pay

## 2014-02-27 MED ORDER — PHENYTOIN SODIUM EXTENDED 100 MG PO CAPS: 400.0000 mg | ORAL_CAPSULE | Freq: Every day | ORAL | Status: DC

## 2014-02-27 MED ORDER — LEVETIRACETAM 750 MG PO TABS: 750.0000 mg | ORAL_TABLET | Freq: Two times a day (BID) | ORAL | Status: DC

## 2014-03-03 ENCOUNTER — Other Ambulatory Visit: Payer: Self-pay | Admitting: Internal Medicine

## 2014-03-06 ENCOUNTER — Other Ambulatory Visit: Payer: Self-pay | Admitting: *Deleted

## 2014-03-06 MED ORDER — BUDESONIDE-FORMOTEROL FUMARATE 80-4.5 MCG/ACT IN AERO
2.0000 | INHALATION_SPRAY | Freq: Two times a day (BID) | RESPIRATORY_TRACT | Status: DC
Start: 2014-03-06 — End: 2015-03-21

## 2014-03-06 NOTE — Progress Notes (Signed)
Refill authorized by C. Young with 11 additional refills.

## 2014-03-16 ENCOUNTER — Other Ambulatory Visit: Payer: Self-pay | Admitting: Internal Medicine

## 2014-04-11 ENCOUNTER — Telehealth: Payer: Self-pay | Admitting: Internal Medicine

## 2014-04-11 MED ORDER — PREDNISONE 10 MG PO TABS: ORAL_TABLET | ORAL | Status: DC

## 2014-04-11 MED ORDER — AMOXICILLIN-POT CLAVULANATE 500-125 MG PO TABS: 1.0000 | ORAL_TABLET | Freq: Three times a day (TID) | ORAL | Status: DC

## 2014-04-11 NOTE — Telephone Encounter (Signed)
Spoke with pt. Reports coughing with production of yellow, thick mucus. SOB, chest tightness and wheezing are also present. Onset was 4 days ago. Would like prednisone and an antibiotic sent in.  Allergies  Allergen Reactions  . Doxycycline Diarrhea and Nausea Only    CY - please advise. Thanks.

## 2014-04-11 NOTE — Telephone Encounter (Signed)
Spoke with pt, aware that Rx called into pharmacy. Nothing further needed.

## 2014-04-11 NOTE — Telephone Encounter (Signed)
Offer prednisone 10 mg, # 20, 4 X 2 DAYS, 3 X 2 DAYS, 2 X 2 DAYS, 1 X 2 DAYS            Amoxacillin 500 mg, # 21, 1 three times daily

## 2014-04-26 ENCOUNTER — Other Ambulatory Visit: Payer: Self-pay | Admitting: Internal Medicine

## 2014-04-27 NOTE — Consult Note (Signed)
PATIENT NAME:  Brooke Baker, Brooke Baker MR#:  [id number] DATE OF BIRTH:  [birth year]  INTERNAL MEDICINE CONSULTATION  DATE OF CONSULTATION:  05/09/2012  PRIMARY CARE PHYSICIAN:  Dr. Einar Pheasant.  REQUESTING PHYSICIAN:  Dr. Joie Bimler, orthopedics.  CONSULTING PHYSICIAN:  Tana Conch. Leslye Peer, MD  REASON FOR CONSULTATION: Preoperative management.   HISTORY OF PRESENT ILLNESS: This is a 80 year old female with multiple medical issues including atrial fibrillation, CVA, on Pradaxa. She went to the bathroom today, was putting the toilet paper back, got tangled on her shoes and fell into the utility room door and could not get up. No loss of consciousness. Luckily, her husband came back home and found her on the floor, called EMS. Pain 10 out of 10 in the right leg, now 8 out of 10 in intensity. She states that she was recently on a prednisone taper for her asthma, but respiratory status is stable at this point. In the ER she was found to have a right femur fracture, an elevated white count and a low potassium. Hospitalist services were contacted for preoperative management.   PAST MEDICAL HISTORY: CVA, atrial fibrillation, hypertension, hyperlipidemia, asthma, depression, coronary artery disease, arthritis, and obesity, seizure.  PAST SURGICAL HISTORY: Right total knee replacement, right total hip replacement, 3 back surgeries, 2 stents in the heart, hysterectomy and cataracts.   ALLERGIES: No known drug allergies.  MEDICATIONS: Include: Amlodipine 10 mg daily, aspirin 81 mg daily, Cozaar 100 mg daily, Crestor 20 mg at bedtime, Dilantin 400 mg at bedtime, EpiPen as needed, fish oil 1000 mg twice a day, hydrochlorothiazide 25 mg daily, Keppra 750 mg twice a day, Lidex cream as directed, metoprolol succinate 50 mg extended-release daily, Mucinex p.r.n., Pradaxa 150 mg twice a day, Symbicort 164.5, 2 puffs twice a day, Ventolin HFA 2 puffs every 4 hours as needed for wheezing, Zoloft 50 mg daily.   SOCIAL  HISTORY: No smoking. No alcohol. No drug use. Lives with her husband. 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   FAMILY HISTORY: Father died at 40 of an intestinal perforation gangrene. Mother died at  45 of old age.   REVIEW OF SYSTEMS: No fever, chills, or sweats. Positive for weight gain.  EYES: She does wear glasses.  EARS, NOSE, MOUTH, AND THROAT: No hearing loss. No sore throat. No difficulty swallowing.  CARDIOVASCULAR: No chest pain. Positive for palpitations.  RESPIRATORY: No shortness of breath. Occasional cough. No sputum. No hemoptysis.  GASTROINTESTINAL: Positive for constipation. No diarrhea. No bright red blood per rectum. No abdominal pain. No nausea or vomiting.  GENITOURINARY: No burning on urination. No hematuria.  MUSCULOSKELETAL: Positive for arthritis pain and right leg pain.  INTEGUMENT: No rashes or eruptions.  NEUROLOGIC: No fainting or blackouts.  PSYCHIATRIC: Positive for depression.  ENDOCRINE: No thyroid problems.  HEMATOLOGIC/LYMPHATIC: No anemia.   PHYSICAL EXAMINATION: VITAL SIGNS: Temperature 97.7, pulse 86, respirations 20, blood pressure 147/76, pulse oximetry 96% on room air.  GENERAL: No respiratory distress.  EYES: Conjunctivae and lids normal. Pupils equal, round, and reactive to light. Extraocular muscles intact. No nystagmus.  EARS, NOSE, MOUTH, AND THROAT: Tympanic membranes: No erythema. Nasal mucosa: No erythema.  THROAT: No erythema. No exudate seen.  TEETH, LIPS, AND GUMS: No lesions.  NECK: No JVD. No bruits. No lymphadenopathy. No thyromegaly. No thyroid nodules palpated.  LUNGS: Clear to auscultation. No use of accessory muscles to breathe. No rhonchi, rales, or wheeze heard.  CARDIOVASCULAR: S1, S2, irregularly regular, rate-controlled.  No gallops or rubs heard; 2/6 systolic ejection murmur. Carotid upstrokes 2+ bilaterally. No bruits. Dorsalis pedis pulses  2+ bilaterally; 2+ edema bilateral lower extremities.   ABDOMEN: Soft, nontender. No organo/splenomegaly. Normoactive bowel sounds. No masses felt.  LYMPHATIC: No lymph nodes in the neck.  MUSCULOSKELETAL: 2+ edema. No clubbing. No cyanosis.  SKIN: No bruising seen on the right leg. Looks like some psoriasis on the right knee.  NEUROLOGIC: Cranial nerves II-XII grossly intact. Did not test deep tendon reflexes secondary to femur fracture.  PSYCHIATRIC: The patient is oriented to person, place and time.   LABORATORY AND RADIOLOGICAL DATA: Chest x-ray negative. Femur showed displaced femoral fracture. Pelvis: No fractures seen.   INR 1.3. White blood cell count 19.7, H and H  13.0 and 36.7, platelet count of 171. Glucose 120, BUN 24, creatinine 0.82, sodium 132, potassium 3.4, chloride 95, CO2 24, calcium 8.6. PTT 32.4.   EKG shows atrial fibrillation, 76 beats per minute. No acute ST-T wave changes.   ASSESSMENT AND PLAN: 1. Preoperative consult for femoral fracture: Since the patient is on Pradaxa, her last dose was in the evening on 05/08/2012 surgery can be done Wednesday afternoon or Thursday morning.  I am going to start heparin at this point to reduce the risk of stroke. Can stop the heparin 1 hour prior to surgery. The patient is already on a preoperative beta blocker. Will continue. The patient is a moderate risk for surgery with her medical issues, but surgery must be done secondary to weightbearing bone.  2.  Leukocytosis: Will send off a urinalysis. This is likely secondary to stress with fall, and the patient was also on recent steroids.  3.  History of atrial fibrillation and cerebrovascular accident: We will give heparin drip to reduce the risk of stroke while off Pradaxa. Stop 1 hour prior to surgery, and then start Pradaxa back up as soon as possible after surgery.  4.  Hypertension: Blood pressure is slightly elevated. Continue usual medications.  5.  Hyperlipidemia: Continue Crestor.  6.  Asthma: Respiratory status stable. Continue  Symbicort.  7.  Depression: On Zoloft.  8.  History of coronary artery disease: Hold aspirin for right now with surgery. Plan to continue metoprolol. No chest pain or shortness of breath. No symptoms. No further cardiac workup.  9.  Hypokalemia: We will replace orally. This could be secondary to the hydrochlorothiazide.  10.  Obesity: BMI of 40.4. Weight loss definitely recommended.  11. Seizure disorder: Continue seizure medications. We will check a Dilantin level.  Time spent on consultation: Fifty-five minutes.    ____________________________ Tana Conch. Leslye Peer, MD rjw:dm D: 05/09/2012 14:31:58 ET T: 05/09/2012 15:03:52 ET JOB#: [id number]  cc: Tana Conch. Leslye Peer, MD, <Dictator> Einar Pheasant, MD Dawayne Patricia, MD  Marisue Brooklyn MD ELECTRONICALLY SIGNED 05/09/2012 21:35

## 2014-04-27 NOTE — Discharge Summary (Signed)
PATIENT NAME:  Brooke Baker, COLLUMS MR#:  [id number] DATE OF BIRTH:  March 24, 1934  DATE OF ADMISSION:  05/09/2012 DATE OF DISCHARGE:  05/17/2012  ATTENDING PHYSICIAN: Dr. Skip Estimable.  ADMITTING DIAGNOSIS: Periprosthetic right femur fracture.   DISCHARGE DIAGNOSIS: Periprosthetic right femur fracture.   HOSPITALIST: Dr. Earleen Newport.  ORTHOPEDIST: Dr. Joie Bimler.  HISTORY: The patient is a pleasant 80 year old female who has been a patient of [facility] for quite some time. She has previously undergone a right total knee arthroplasty in 2005 and a right total hip arthroplasty in 2009 performed by Dr. Skip Estimable. She had done well with these; however, on the day of admission, she had gotten up and subsequently tripped over her shoes in the bathroom and fell landing directly on the right side. She was unable to bear weight or stand. She states she did see a noticeable deformity at the time. She voiced no other complaints. She subsequently was brought to [facility] where x-rays were obtained which demonstrated a right periprosthetic femoral shaft fracture that extended from the distal aspect of the femoral implant to the area just proximal to the femoral implant of the total knee. After discussion of the risks and benefits of surgical intervention, the patient expressed her understanding of the risks and benefits and agreed for plans for surgical intervention. However, the hospital course was delayed secondary to the fact the patient was on Pradaxa. She had to be off it for three days. Subsequently, surgery was not performed until 05/13/2012.   PROCEDURE: Open reduction and internal fixation of a right femoral periprosthetic femoral shaft fracture.   ANESTHESIA: Spinal.   IMPLANTS UTILIZED: Synthes 16 hole right 4.5 mm LCP curved condyle plate, six 5.0 mm locking screws, one 4.5 mm cortical screw, three 1.7 cables with crimp and three 4.5 mm threaded collage  positioning pins.   HOSPITAL COURSE: The patient tolerated the procedure very well. She had no complications. She was then taken to PACU where she was stabilized, then transferred to the orthopedic floor. The patient began receiving anticoagulation therapy of Pradaxa postoperatively, as scheduled preoperatively. She was started on Lovenox 30 mg subcu q.12 hours, but this was stopped one day after starting. This was done by the pharmacist even after Dr. Marry Guan discussed with them that Pradaxa was not indicated for the prevention of DVT's. She was also fitted with the A1 compression foot pumps bilaterally set at 80 mmHg. Her calves were nontender. There is no evidence of any DVTs. The patient was also fitted with TED stockings bilaterally. These were allowed to be removed one hour per 8 hour shift. The heels were elevated off the bed using rolled towels.   The patient is denying any chest pains or shortness of breath. Vital signs have been stable. She has been afebrile. Hemodynamically, she received two units of packed RBCs secondary to a hemoglobin drop into 7.2 on day 1. Follow-up hemoglobin was 8 and then she dropped to 7.3 once again. The sequential follow-up status appeared to show that she was stable at 8.5 upon being discharged. Her laboratory studies showed that she was slightly hypernatremic and this was corrected during the hospital stay. She also had a slight hypokalemia episode for which potassium was given and she responded well with this. She has had no symptoms or any complication from the sodium or potassium.   The patient began receiving physical therapy on day 1 following surgery for gait training and transfers. She has been extremely slow. She is  a deconditioned lady. She basically was just flatfoot touchdown weight-bearing and was only ambulating a couple feet at a time. She was participating in therapy, however. Occupational therapy was also initiated on day 1 for ADLs and assistive devices.    The patient's Foley, IV and Hemovac were discontinued on day 2 along with a dressing change. The wound was free of any signs of infection, but was noted to have serosanguinous drainage. This was felt to be secondary to the Pradaxa. The wound upon being discharged had decreased significantly as far as draining  and was looking very good.   DISPOSITION: The patient is being discharged to skilled nursing facility in improved stable condition.   DISCHARGE INSTRUCTIONS: She is to continue with flatfoot touchdown weight-bearing. Continue PT for gait training and transfers. OT for ADLs and assistive devices. Continue using a walker until cleared by Physical Therapy to go to a quad cane, do not anticipate this being until at least 6 to 8 weeks. We need to change dressing as needed. Remove the staples on 05/27/2012 and apply half-inch Steri-Strips with benzoin. She needs to follow up in the clinic in two weeks. TED stockings to be worn 24 hours a day, but may be removed one hour per 8 hour shift. Elevate heels off the bed. Incentive spirometer q.1 hour while awake. Encouraged cough, deep breathing q.2 hours while awake. She is placed on a regular diet.   MEDICATIONS: Norvasc 10 mg daily, Senokot-S one tablet b.i.d., hydrochlorothiazide 25 mg daily, levetiracetam tablet 750 mg q.12 hours, Cozaar 100 mg daily, metoprolol XL 50 mg daily, pantoprazole 40 mg b.i.d, Dilantin 400 mg at bedtime, Crestor 20 mg at bedtime, Zoloft 50 mg daily, Pradaxa 150 mg b.i.d., Lovenox 30 mg subcu q.12 hours for 14 days then discontinue, Symbicort 160/4.5 inhaler two puffs b.i.d., Tylenol ES 500 to 1,000 mg q.4 to 6 hours p.r.n., Dulcolax suppositories 10 mg rectally p.r.n. for constipation, Milk of magnesia 30 mL b.i.d. p.r.n., Roxicodone 5 to 10 mg q.4 to 6 hours p.r.n., MiraLax powder 17 grams daily p.r.n., albuterol oral inhaler two puffs q.4 hours while awake, enema soapsuds if no results with Milk of Magnesia or Dulcolax.   DRUG  ALLERGIES: No known drug allergies.   PAST MEDICAL HISTORY: Depression, COPD, psoriasis, stroke, asthma, heart catheterization x 2, cataracts, seizure disorders.    ____________________________ Vance Peper, PA jrw:es D: 05/17/2012 12:09:40 ET T: 05/17/2012 12:48:28 ET JOB#: [id number]  cc: Vance Peper, PA, <Dictator> [place] PA ELECTRONICALLY SIGNED 05/18/2012 21:01

## 2014-04-27 NOTE — Consult Note (Signed)
No labs yet- will see after labs back  Electronic Signatures: Loletha Grayer (MD)  (Signed on 05-May-14 13:02)  Authored  Last Updated: 05-May-14 13:02 by Loletha Grayer (MD)

## 2014-04-27 NOTE — H&P (Signed)
Subjective/Chief Complaint Right leg pain   History of Present Illness Brooke Baker is a 80 year old female well known to [place]. She has undergone a TKA in 2005 and a THA in 2009 by Dr. Marry Guan with excellent recovery.  Unfortunately, earlier today she tripped on her shoes in the bathroom and fell directly onto her right side sustaining a periprosthetic femur fracture.  She had a notable deformity and was unable to besr weight.   Past Med/Surgical Hx:  depression:   copd:   psoriasis:   Stroke:   Asthma:   heart cath x2:   cataracts:   right total hip replacement:   Seizure disorder: Last 04/16/06  Back surgery x 3:   Right TKR:   Stent - Cardiac:   cardu:   Hysterectomy:   ALLERGIES:  NKDA: None  Other -Explain in Comment Field: Unknown  HOME MEDICATIONS: Medication Instructions Status  Cozaar 100 mg oral tablet 1 tab(s) orally once a day  Active  amlodipine 10 mg oral tablet 1 tab(s) orally once a day AM Active  Crestor 20 mg oral tablet 1 tab(s) orally once a day (at bedtime)  Active  Keppra 750 mg oral tablet 1 tab(s) orally 2 times a day  Active  Dilantin 100 mg oral capsule, extended release 4 cap(s) orally once a day (at bedtime) Active  hydrochlorothiazide 25 mg oral tablet 1 tab(s) orally once a day Active  Zoloft 50 mg oral tablet 1 tab(s) orally once a day Active  aspirin 81 mg oral tablet 1 tab(s) orally once a day Active  Ventolin HFA CFC free 90 mcg/inh inhalation aerosol 2 puff(s) inhaled every 4 hours, As Needed - for Wheezing Active  Symbicort 160 mcg-4.5 mcg/inh inhalation aerosol 2 puff(s) inhaled 2 times a day Active  Pradaxa 150 mg oral capsule 1 cap(s) orally 2 times a day Active  Mucinex DM 30 mg-600 mg oral tablet, extended release 1 tab(s) orally every 12 hours Active  EpiPen 2-Pak 0.3 mg injectable kit 1  injectable , As Needed Active  Lidex  [place] as directed Active  metoprolol succinate 50 mg oral tablet, extended release 1 tab(s) orally once a day Active   Fish Oil 1000 mg oral capsule 1 cap(s) orally 2 times a day Active   Family and Social History:  Family History Non-Contributory   Social History negative tobacco, negative ETOH, negative Illicit drugs   Place of Living Home  with husband   Review of Systems:  Subjective/Chief Complaint Right leg pain   Fever/Chills No   Cough No   Sputum No   Abdominal Pain No   Diarrhea No   Constipation No   Nausea/Vomiting No   SOB/DOE No   Chest Pain No   Telemetry Reviewed Afib   Dysuria No   Medications/Allergies Reviewed Medications/Allergies reviewed   Physical Exam:  GEN well developed, well nourished, no acute distress   HEENT pink conjunctivae, PERRL, hearing intact to voice, moist oral mucosa   NECK supple  No masses   RESP normal resp effort  clear BS   CARD irregular rate   GU foley catheter in place   EXTR right leg held with knee slightly flexed and adducted.   SKIN positive rashes, psoriatic lesions over knee and postero-lateral femur   NEURO motor/sensory function intact, TA, GS intact.  Sensation DPN, SPN, tib nerve intact. DP palpable 1+   PSYCH A+O to time, place, person, good insight   Lab Results: Routine BB:  05-May-14  13:37   ABO Group + Rh Type O Positive  Antibody Screen NEGATIVE (Result(s) reported on 09 May 2012 at 03:32PM.)  Routine Chem:  05-May-14 13:37   Glucose, Serum  120  BUN  24  Creatinine (comp) 0.82  Sodium, Serum  132  Potassium, Serum  3.4  Chloride, Serum  95  CO2, Serum 24  Calcium (Total), Serum 8.6  Anion Gap 13  Osmolality (calc) 270  eGFR (African American) >60  eGFR (Non-African American) >60 (eGFR values <52m/min/1.73 m2 may be an indication of chronic kidney disease (CKD). Calculated eGFR is useful in patients with stable renal function. The eGFR calculation will not be reliable in acutely ill patients when serum creatinine is changing rapidly. It is not useful in  patients on dialysis. The eGFR  calculation may not be applicable to patients at the low and high extremes of body sizes, pregnant women, and vegetarians.)  Routine Coag:  05-May-14 13:37   Activated PTT (APTT) 32.4 (A HCT value >55% may artifactually increase the APTT. In one study, the increase was an average of 19%. Reference: "Effect on Routine and Special Coagulation Testing Values of Citrate Anticoagulant Adjustment in Patients with High HCT Values." American Journal of Clinical Pathology 2006;126:400-405.)  Prothrombin  16.5  INR 1.3 (INR reference interval applies to patients on anticoagulant therapy. A single INR therapeutic range for coumarins is not optimal for all indications; however, the suggested range for most indications is 2.0 - 3.0. Exceptions to the INR Reference Range may include: Prosthetic heart valves, acute myocardial infarction, prevention of myocardial infarction, and combinations of aspirin and anticoagulant. The need for a higher or lower target INR must be assessed individually. Reference: The Pharmacology and Management of the Vitamin K  antagonists: the seventh ACCP Conference on Antithrombotic and Thrombolytic Therapy. CSTMHD.6222Sept:126 (3suppl): [id number] A HCT value >55% may artifactually increase the PT.  In one study,  the increase was an average of 25%. Reference:  "Effect on Routine and Special Coagulation Testing Values of Citrate Anticoagulant Adjustment in Patients with High HCT Values." American Journal of Clinical Pathology 2006;126:400-405.)  Routine Hem:  05-May-14 13:37   WBC (CBC)  19.7  RBC (CBC) 3.81  Hemoglobin (CBC) 13.0  Hematocrit (CBC) 36.7  Platelet Count (CBC) 171 (Result(s) reported on 09 May 2012 at 02:00PM.)  MCV 96  MCH  34.2  MCHC 35.6  RDW 13.1   Radiology Results: XRay:    05-May-14 11:16, Femur Right  Femur Right  REASON FOR EXAM:    fall, right hp pain, need hiup and femur  COMMENTS:       PROCEDURE: DXR - DXR FEMUR RIGHT  - May 09 2012 11:16AM     RESULT: Comparison: None.    Findings:  There is a mildly displaced oblique fracture of the femoral diaphysis   extending from the distal aspect of the femoral stem arthroplasty to the   superior aspect of the distal femoral knee arthroplasty.    IMPRESSION:   Displaced femoral fracture.  Dictation site: 2        Verified By: RGregor Hams M.D., MD    [id number], Pelvis AP Only  Pelvis AP Only  REASON FOR EXAM:    fall, right hip pain  COMMENTS:       PROCEDURE: DXR - DXR PELVIS AP ONLY  - May 09 2012 11:16AM     RESULT: Comparison: None.    Findings:  Hardware is incompletely visualized from right hip  arthroplasty and   lumbar spine fusion. No acute fracture seen.    IMPRESSION:   No acute fracture seen.    Dictation site: 2    Verified By: Gregor Hams, M.D., MD  LabUnknown:    05-May-14 11:16, Femur Right  PACS Image    05-May-14 11:16, Pelvis AP Only  PACS Image    Assessment/Admission Diagnosis Right femur long spiral peri-prosthetic fracture from hip impant stem to proximal aspect of the TKA femoral component.   Plan Medical consult for clearance. Will likely not be cleared until Wed or Thurs due to pradaxa. Plan for ORIF of femur.   Electronic Signatures: Dawayne Patricia (MD)  (Signed [id number] 16:16)  Authored: CHIEF COMPLAINT and HISTORY, PAST MEDICAL/SURGIAL HISTORY, ALLERGIES, HOME MEDICATIONS, FAMILY AND SOCIAL HISTORY, REVIEW OF SYSTEMS, PHYSICAL EXAM, LABS, Radiology, ASSESSMENT AND PLAN   Last Updated: 05-May-14 16:16 by Dawayne Patricia (MD)

## 2014-04-27 NOTE — Op Note (Signed)
PATIENT NAME:  Brooke Baker, KRAKER MR#:  [id number] DATE OF BIRTH:  Jan 02, 1935  DATE OF PROCEDURE:  05/13/2012  PREOPERATIVE DIAGNOSIS:  Right periprosthetic femoral shaft fracture.   POSTOPERATIVE DIAGNOSIS:  Right periprosthetic femoral shaft fracture.  PROCEDURE PERFORMED:  Open reduction and internal fixation of right femoral shaft fracture.   SURGEON:  Dr. Laurice Record. Hooten  ANESTHESIA:  Spinal.   ESTIMATED BLOOD LOSS:  700 mL  FLUIDS REPLACED:  4000 mL of crystalloid.   DRAINS:  Two medium drains to a Hemovac reservoir.  IMPLANTS UTILIZED:  Synthes 16-hole right 4.5 mm LCP curved condyle plate, six 5.0 mm locking screws, one 4.5 mm cortical screw, three 1.7 mm cables with crimp, and three 4.5 mm threaded cerclage positioning pins.   INDICATIONS FOR SURGERY:  The patient is a 80 year old female who sustained a fall and landed on the right leg. She had previously undergone right total hip arthroplasty and right total knee arthroplasty. She was unable to stand or bear weight on the right lower extremity due to the pain. X-rays obtained at [facility] demonstrated a right periprosthetic femoral shaft fracture that extended from the distal aspect of the femoral implant to an area just proximal to the femoral implant of the total knee. After discussion of the risks and benefits of surgical intervention, the patient expressed understanding of the risks, benefits and agreed with plans for surgical intervention. Surgery was delayed, as the patient had been on Pradaxa and appropriate discontinuation of the Pradaxa was performed so as to decrease bleeding risk.   PROCEDURE IN DETAIL:  The patient was brought to the operating room and, after adequate spinal anesthesia was achieved, the patient was placed on the fracture table. Traction was applied to the right lower extremity and all bony prominences were well padded. The patient's right leg was cleaned and prepped with alcohol and DuraPrep,  draped in the usual sterile fashion. A "timeout" was performed as per usual protocol. A lateral longitudinal incision was made extending along the distal aspect of the femur. Dissection was carried down to the IT band which was sharply incised and reflected off of the distal femur. A 16-hole 4.5 mm LCP condylar plate was then advanced in a retrograde fashion submuscularly along the lateral aspect of the femoral shaft. Good provisional position was noted and a K wire was inserted through the central drill sleeve distally for provisional marking. A stab incision was made at the fourth hole site through the outrigger device and trocar was advanced to the lateral cortex. A "whirlybird" reduction tool was then advanced and the distal fragment was reduced accordingly. Good position was noted in both AP and lateral planes. 5.0 mm locking screws were then inserted along the distal portion of the plate with good position noted using a C-arm.  Next, incision was made proximally and the IT band was incised. The vastus lateralis was elevated off the intermuscular septum with care to maintain good hemostasis using electrocautery. Lateral cortex was subsequently visualized. A 4.5 mm cortical screw was inserted below the tip of the femoral implant for provisional fixation of the plate proximally. A total of three 1.7 mm cerclage wires were placed around the femur and through the cerclage pin sites. Good position was noted using C-arm. Tensioning device was applied in a serial fashion and the crimping device was used to secure the cables. Excess cable was cut using wire cutters. The femur was visualized in multiple planes with good maintenance of reduction appreciated. Outrigger device  was fully detached. The wounds were irrigated with copious amounts of normal saline with antibiotic solution using a bulb syringe. Two medium drains were placed in the proximal wound bed and brought through a separate stab incision. IT band was  repaired using interrupted sutures of #1 Vicryl. The subcutaneous tissue was approximated in layers using first #0 Vicryl followed by 2-0 Vicryl. The skin was closed with skin staples. A sterile dressing was applied.  The patient tolerated the procedure well. She was transported to the recovery room in stable condition.   ____________________________ Laurice Record. Holley Bouche., MD jph:ce D: 05/13/2012 19:14:01 ET T: 05/14/2012 14:45:36 ET JOB#: [id number]  cc: [name] P. Holley Bouche., MD, <Dictator> Laurice Record Holley Bouche MD ELECTRONICALLY SIGNED 05/15/2012 21:36

## 2014-05-12 ENCOUNTER — Other Ambulatory Visit: Payer: Self-pay | Admitting: Internal Medicine

## 2014-05-14 ENCOUNTER — Ambulatory Visit (INDEPENDENT_AMBULATORY_CARE_PROVIDER_SITE_OTHER): Payer: Medicare PPO | Admitting: Internal Medicine

## 2014-05-14 ENCOUNTER — Encounter: Payer: Self-pay | Admitting: Internal Medicine

## 2014-05-14 VITALS — BP 124/60 | HR 101 | Wt 248.0 lb

## 2014-05-14 DIAGNOSIS — J309 Allergic rhinitis, unspecified: Secondary | ICD-10-CM | POA: Diagnosis not present

## 2014-05-14 DIAGNOSIS — J3089 Other allergic rhinitis: Secondary | ICD-10-CM

## 2014-05-14 DIAGNOSIS — J209 Acute bronchitis, unspecified: Secondary | ICD-10-CM | POA: Diagnosis not present

## 2014-05-14 DIAGNOSIS — J302 Other seasonal allergic rhinitis: Secondary | ICD-10-CM

## 2014-05-14 MED ORDER — LEVALBUTEROL HCL 0.63 MG/3ML IN NEBU: 0.6300 mg | INHALATION_SOLUTION | Freq: Once | RESPIRATORY_TRACT | Status: DC

## 2014-05-14 MED ORDER — METHYLPREDNISOLONE ACETATE 80 MG/ML IJ SUSP: 80.0000 mg | Freq: Once | INTRAMUSCULAR | Status: DC

## 2014-05-14 MED ORDER — ALBUTEROL SULFATE HFA 108 (90 BASE) MCG/ACT IN AERS: INHALATION_SPRAY | RESPIRATORY_TRACT | Status: DC

## 2014-05-14 MED ORDER — PREDNISONE 10 MG PO TABS: ORAL_TABLET | ORAL | Status: DC

## 2014-05-14 NOTE — Patient Instructions (Addendum)
Script sent for prednisone taper  Script sent refilling your ventolin inhaler  Order- neb xop 0.63             Depo 80 We will have the allergy lab help you re-order your allergy vaccine  Order- future CXR Dx asthmatic bronchitis    To be done at next ov in 2 months

## 2014-05-14 NOTE — Progress Notes (Signed)
Patient ID: Brooke Baker, female    DOB: 07-Dec-1934, 80 y.o.   MRN: [id number]  Asthma Her past medical history is significant for asthma.   07/07/10- 80 yoF never smoker followed for Asthma, allergic rhinitis, complicated by hx CAD, CVA. Last here January 4, 80 - note reviewed.  She is staying in mostly, in hot weather, but tanned from tanning bed for psoriasis. Has been more aware of dyspnea for months, more with exertion than at rest. She has noted a little wheeze, but main concern is with her heart. Can't sleep supine due to dyspnea, but better on either side. Nocturia x 2. Denies productive cough,  chest pain or palpitation. She has cardiologist [facility]. PCP is Dr Einar Pheasant, who saw her recently and has ordered blood work, pending.  No recent CXR.  Continues allergy vaccine- feels the shots  have helped her. 1:10 GO.  Using ventolin rescue inhaler 3-4 x recently, symbicort twice daily. Not on prednisone now.    01/12/11- 80 yoF never smoker followed for Asthma, allergic rhinitis, complicated by hx CAD, CVA. Acute visit-having increased SOB, wheezing, cough-productive-yellow/green in color She slipped and fell at home several days ago and has 2 black eyes, on Plavix. She was evaluated with no serious trauma found. Has had flu vaccine. Continues allergy vaccine. For past 3 or 4 days has had increased shortness of breath, wheeze, cough with greenish sputum. Denies fever, sore throat, GI upset. Sleeping in recliner because of postnasal drip. Denies ankle edema.  01/04/12- 80 yoF never smoker followed for Asthma, allergic rhinitis, complicated by hx CAD, CVA. FOLLOWS FOR: doing well since last visit; no longer having the wheezing. Was treated at a walk in clinic during the fall season, for bronchitis. Occasional episode of cough or wheeze. Now on Pradaxa for AFib Continues allergy vaccine 1:10. Family gives w/o problem.  04/25/12 Acute OV  Complains of increased SOB,  wheezing, prod cough with brown/yellow mucus x3days - No fever or chest pain. Appetite is good. No n/v.  No otc used.  No recent travel or abx use.  Cough is getting worse. More wheezing this am.  >Zpack   05/11/13- 80 yoF never smoker followed for Asthma, allergic rhinitis, complicated by hx DM, CAD, CVA/AFib ACUTE VISIT:  Increased SOB, wheezing, PND and cough with clear mucus worsening over past 2 days.  Allergy vaccine 1:10 by family, doing well (pt does at home) >pred taper /depo medrol .    05/30/2013 Acute OV (Asthma/AR-never smoker )  Complains of  wheezing, increased SOB, chest tightness, some cough with light yellow mucus x2 weeks.   Denies f/c/s, head congestion, hemoptysis, nausea, vomiting.   Finished Doxycycline last week. Has been on 2 steroid taper over last month. Gets some better but never gets over totally.  Remains on Symbicort. 80/4.45mcg .   Ankles more swollen. On norvasc 10mg  and HCTZ 25mg  .  No wt gain.  Eating good. No n/v/d.  No sinus congestion.   05/13/13- 80 yoF never smoker followed for Asthma, allergic rhinitis, complicated by hx DM, CAD, CVA/AFib FOLLOWS FOR Pt c/o increased SOB, chest tightness, wheezing, and productive cough x  days. Denies f/c/s, n/v  Describes asthma attack 3 days ago blamed on pollen and whether. She slept up in her recliner and used her rescue inhaler appropriately. Now much improved. Allergy vaccine 1:10 GO without problems. CXR 05/31/14 image reviewed IMPRESSION: Diffuse chronic interstitial change with no acute findings Electronically Signed  By: Elodia Florence.D.  On: 05/30/2013 17:10  Review of Systems-see HPI Constitutional:   No-   weight loss, night sweats, fevers, chills, + fatigue, lassitude. HEENT:   No-  headaches, difficulty swallowing, tooth/dental problems, sore throat,       No-  sneezing, itching, ear ache, nasal congestion, CV:  No-   chest pain,  +orthopnea, PND, swelling in lower extremities, anasarca,   dizziness, palpitations Resp: +shortness of breath with exertion or at rest.              No-   productive cough,  + non-productive cough,  No- coughing up of blood.              No-   change in color of mucus.  + wheezing.   Skin: No-   rash or lesions. GI:  No-   heartburn, indigestion, abdominal pain, nausea, vomiting,  GU: MS:  No-   joint pain or swelling.  . Neuro-     nothing unusual Psych:  No- change in mood or affect. No depression or anxiety.  No memory loss.   Objective:   Physical Exam General- Alert, Oriented, Affect-appropriate, Distress- none acute,               +obese Skin- rash-none, lesions- none, excoriation- none Lymphadenopathy- none Head- atraumatic            Eyes- Gross vision intact, PERRLA, conjunctivae clear secretions. Bilateral shiners            Ears- Hearing, canals-normal            Nose- Sniffing +, no-Septal dev, mucus, polyps, erosion, perforation             Throat- Mallampati II , mucosa clear , drainage- none, tonsils- atrophic Neck- flexible , trachea midline, no stridor , thyroid nl, carotid no bruit Chest - symmetrical excursion , unlabored           Heart/CV-+ IRR , no murmur , no gallop  , no rub, nl s1 s2                           - JVD- none , edema- 1+ , stasis changes- none, varices- none           Lung-     wheeze + diffuse            Chest wall-  Abd-  Br/ Gen/ Rectal- Not done, not indicated Extrem- cyanosis- none, clubbing, none, atrophy- none, strength- nl, + heavy legs with edema,            Neg [facility] Neuro- grossly intact to observation

## 2014-05-15 ENCOUNTER — Telehealth: Payer: Self-pay | Admitting: Internal Medicine

## 2014-05-15 NOTE — Telephone Encounter (Signed)
Spoke with pt, scheduled for 2 month rov.  Nothing further needed.

## 2014-05-15 NOTE — Telephone Encounter (Signed)
I do not see any notes in chart regarding call from [facility].  Katie - did you call this patient?

## 2014-05-15 NOTE — Telephone Encounter (Signed)
Pt was here late Monday evening and was told to call back to schedule her appt for follow up. Pt just needs to make her next OV from her AVS. Thanks.

## 2014-05-19 ENCOUNTER — Other Ambulatory Visit: Payer: Self-pay | Admitting: Internal Medicine

## 2014-05-22 ENCOUNTER — Telehealth: Payer: Self-pay | Admitting: Internal Medicine

## 2014-05-22 MED ORDER — AMOXICILLIN-POT CLAVULANATE 500-125 MG PO TABS: 1.0000 | ORAL_TABLET | Freq: Three times a day (TID) | ORAL | Status: DC

## 2014-05-22 MED ORDER — PREDNISONE 10 MG PO TABS: ORAL_TABLET | ORAL | Status: DC

## 2014-05-22 NOTE — Telephone Encounter (Signed)
Pt aware of refills being sent to pharmacy.  Nothing further needed.

## 2014-05-22 NOTE — Telephone Encounter (Signed)
Spoke with pt. Still reporting coughing with production of yellow, thick mucus. SOB and chest tightness are improved but still present as well. Has finished antibiotic and pred taper. She is requesting another round of both medications.  Allergies  Allergen Reactions  . Doxycycline Diarrhea and Nausea Only   Current Outpatient Prescriptions on File Prior to Visit  Medication Sig Dispense Refill  . albuterol (PROVENTIL) (2.5 MG/3ML) 0.083% nebulizer solution USE ONE VIAL VIA NEBULIZER EVERY FOUR HOURS AS NEEDED FOR WHEEZING 180 mL 6  . albuterol (VENTOLIN HFA) 108 (90 BASE) MCG/ACT inhaler 2 puffs every 4 hours as directed- rescue 18 g prn  . amLODipine (NORVASC) 10 MG tablet TAKE ONE (1) TABLET EACH DAY 30 tablet 5  . amoxicillin-clavulanate (AUGMENTIN) 500-125 MG per tablet Take 1 tablet (500 mg total) by mouth 3 (three) times daily. (Patient not taking: Reported on 05/14/2014) 21 tablet 0  . aspirin 81 MG tablet Take 81 mg by mouth daily.      . budesonide-formoterol (SYMBICORT) 80-4.5 MCG/ACT inhaler Inhale 2 puffs into the lungs 2 (two) times daily. 1 Inhaler 11  . CRESTOR 20 MG tablet TAKE ONE (1) TABLET EACH DAY 30 tablet 5  . dabigatran (PRADAXA) 150 MG CAPS Take 150 mg by mouth every 12 (twelve) hours.    [name] EPINEPHrine (EPIPEN) 0.3 mg/0.3 mL DEVI Inject 0.3 mLs (0.3 mg total) into the muscle once. 1 Device prn  . fluocinonide (LIDEX) 0.05 % external solution Use as needed    . fluticasone (FLONASE) 50 MCG/ACT nasal spray USE 2 SPRAYS EACH NOSTRIL DAILY 16 g 1  . hydrochlorothiazide (HYDRODIURIL) 25 MG tablet TAKE ONE (1) TABLET EACH DAY 30 tablet 5  . levETIRAcetam (KEPPRA) 750 MG tablet Take 1 tablet (750 mg total) by mouth 2 (two) times daily. 60 tablet 5  . losartan (COZAAR) 100 MG tablet TAKE ONE (1) TABLET EACH DAY 30 tablet 8  . metoprolol succinate (TOPROL-XL) 50 MG 24 hr tablet TAKE ONE TABLET BY MOUTH EVERY NIGHT. 30 tablet 6  . mupirocin ointment (BACTROBAN) 2 % Apply to  affected area bid (Patient not taking: Reported on 05/14/2014) 22 g 0  . Omega-3 Fatty Acids (FISH OIL) 1000 MG CAPS Take 1 capsule by mouth 2 (two) times daily.      . phenytoin (DILANTIN) 100 MG ER capsule Take 4 capsules (400 mg total) by mouth at bedtime. 120 capsule 5  . polyethylene glycol powder (GLYCOLAX/MIRALAX) powder Take 17 g by mouth at bedtime.    . predniSONE (DELTASONE) 10 MG tablet Take 6 tablets x 1 day and then decrease by 1/2 tablet per day until down to zero mg. (Patient not taking: Reported on 05/14/2014) 39 tablet 0  . predniSONE (DELTASONE) 10 MG tablet Take 4 tabs po x 2 days, then 3 x 2 days, then 2 x 2 days, then 1 x 2 days then stop. 20 tablet 0  . sertraline (ZOLOFT) 50 MG tablet TAKE 1 AND 1/2 TABLETS BY MOUTH ONCE DAILY 45 tablet 2  . triamcinolone cream (KENALOG) 0.1 % Apply topically daily.     Current Facility-Administered Medications on File Prior to Visit  Medication Dose Route Frequency Provider Last Rate Last Dose  . levalbuterol (XOPENEX) nebulizer solution 0.63 mg  0.63 mg Nebulization Once Deneise Lever, MD      . methylPREDNISolone acetate (DEPO-MEDROL) injection 80 mg  80 mg Intramuscular Once Deneise Lever, MD        CY - please advise. Thanks.

## 2014-05-22 NOTE — Telephone Encounter (Signed)
Ok to refill both antibiotic and prednisone taper

## 2014-06-03 NOTE — Assessment & Plan Note (Signed)
Mild sniffing without any visible drainage. We discussed use of antihistamines

## 2014-06-03 NOTE — Assessment & Plan Note (Addendum)
Acute exacerbation. I suspect a viral trigger but nonspecific. She seems substantially improved but still wheezing. We discussed control and medications. Need to watch for possible cardiogenic fluid overload component. Plan-refill rescue inhaler. Chest x-ray on return          nebulizer treatment with Xopenex, Depo-Medrol

## 2014-06-14 ENCOUNTER — Telehealth: Payer: Self-pay | Admitting: Internal Medicine

## 2014-06-14 ENCOUNTER — Ambulatory Visit (INDEPENDENT_AMBULATORY_CARE_PROVIDER_SITE_OTHER): Payer: Medicare PPO

## 2014-06-14 DIAGNOSIS — J309 Allergic rhinitis, unspecified: Secondary | ICD-10-CM

## 2014-06-14 NOTE — Telephone Encounter (Signed)
Date Mixed: 06/14/2014 Vial: AB Strength: 1:10 Here/Mail/Pick Up: Mail Mixed By: Alroy Bailiff, CMA

## 2014-07-19 NOTE — Telephone Encounter (Signed)
Date Mixed: 06/14/14 Vial: 2 Strength: 1:10 Here/Mail/Pick Up: mail Mixed By: Laurette Schimke

## 2014-07-21 ENCOUNTER — Other Ambulatory Visit: Payer: Self-pay | Admitting: Internal Medicine

## 2014-07-28 ENCOUNTER — Other Ambulatory Visit: Payer: Self-pay | Admitting: Internal Medicine

## 2014-07-30 NOTE — Telephone Encounter (Signed)
ok'd refill sertraline #45 with one refill.  Needs f/u appt with me (30 min) within the next two months.

## 2014-07-30 NOTE — Telephone Encounter (Signed)
Please advise refill? Last OV was 12/28/13

## 2014-08-02 ENCOUNTER — Ambulatory Visit: Payer: Medicare PPO | Admitting: Internal Medicine

## 2014-08-06 ENCOUNTER — Ambulatory Visit (INDEPENDENT_AMBULATORY_CARE_PROVIDER_SITE_OTHER): Payer: Medicare PPO | Admitting: Nurse Practitioner

## 2014-08-06 ENCOUNTER — Encounter: Payer: Self-pay | Admitting: Nurse Practitioner

## 2014-08-06 VITALS — BP 148/68 | HR 75 | Ht 66.0 in | Wt 251.4 lb

## 2014-08-06 DIAGNOSIS — Z5181 Encounter for therapeutic drug level monitoring: Secondary | ICD-10-CM | POA: Diagnosis not present

## 2014-08-06 DIAGNOSIS — G40909 Epilepsy, unspecified, not intractable, without status epilepticus: Secondary | ICD-10-CM | POA: Diagnosis not present

## 2014-08-06 MED ORDER — PHENYTOIN SODIUM EXTENDED 100 MG PO CAPS: 400.0000 mg | ORAL_CAPSULE | Freq: Every day | ORAL | Status: DC

## 2014-08-06 MED ORDER — LEVETIRACETAM 750 MG PO TABS: 750.0000 mg | ORAL_TABLET | Freq: Two times a day (BID) | ORAL | Status: DC

## 2014-08-06 NOTE — Patient Instructions (Signed)
Pt to continue Keppra at current dose Continue Dilantin at current dose F/U in 1 year

## 2014-08-06 NOTE — Progress Notes (Signed)
I have read the note, and I agree with the clinical assessment and plan.  Lashauna Arpin KEITH   

## 2014-08-06 NOTE — Progress Notes (Signed)
[facility]  PATIENT: Brooke Baker DOB: 1934/08/19   REASON FOR VISIT: Follow-up for seizure disorder HISTORY FROM: Patient and husband    HISTORY OF PRESENT ILLNESS:Brooke Baker is a 80 year old right-handed white female with a history of seizures. The patient has not had any seizures in a number of years,  since 2004. The patient indicates that since she went on a combination of Dilantin and Keppra, the seizures have essentially disappeared. The patient usually operates a motor vehicle, but she has been unable to drive since she fractured her femur, and she still requires a walker for ambulation. She has not had any further falls. She is on a combination of calcium and vitamin D supplementation currently. She is tolerating the medications fairly well, and she recently had blood work done through her primary care physician, but a dilantin level was not included. She returns for an evaluation at this time. She needs refills on her medications.  REVIEW OF SYSTEMS: Full 14 system review of systems performed and notable only for those listed, all others are neg:  Constitutional: neg  Cardiovascular: Leg swelling Ear/Nose/Throat: neg  Skin: neg Eyes: neg Respiratory: Wheezing shortness of breath Gastroitestinal: Constipation Hematology/Lymphatic: neg  Endocrine: neg Musculoskeletal:neg Allergy/Immunology: neg Neurological: neg Psychiatric: neg Sleep : neg   ALLERGIES: Allergies  Allergen Reactions  . Doxycycline Diarrhea and Nausea Only    HOME MEDICATIONS: Outpatient Prescriptions Prior to Visit  Medication Sig Dispense Refill  . albuterol (PROVENTIL) (2.5 MG/3ML) 0.083% nebulizer solution USE ONE VIAL VIA NEBULIZER EVERY FOUR HOURS AS NEEDED FOR WHEEZING 180 mL 6  . albuterol (VENTOLIN HFA) 108 (90 BASE) MCG/ACT inhaler 2 puffs every 4 hours as directed- rescue 18 g prn  . amLODipine (NORVASC) 10 MG tablet TAKE ONE (1) TABLET EACH DAY 30 tablet 5  .  aspirin 81 MG tablet Take 81 mg by mouth daily.      . budesonide-formoterol (SYMBICORT) 80-4.5 MCG/ACT inhaler Inhale 2 puffs into the lungs 2 (two) times daily. 1 Inhaler 11  . CRESTOR 20 MG tablet TAKE ONE (1) TABLET EACH DAY 30 tablet 5  . dabigatran (PRADAXA) 150 MG CAPS Take 150 mg by mouth every 12 (twelve) hours.    [name] EPINEPHrine (EPIPEN) 0.3 mg/0.3 mL DEVI Inject 0.3 mLs (0.3 mg total) into the muscle once. 1 Device prn  . fluocinonide (LIDEX) 0.05 % external solution Use as needed    . fluticasone (FLONASE) 50 MCG/ACT nasal spray USE 2 SPRAYS EACH NOSTRIL DAILY 16 g 1  . hydrochlorothiazide (HYDRODIURIL) 25 MG tablet TAKE ONE (1) TABLET EACH DAY 30 tablet 5  . levETIRAcetam (KEPPRA) 750 MG tablet Take 1 tablet (750 mg total) by mouth 2 (two) times daily. 60 tablet 5  . losartan (COZAAR) 100 MG tablet TAKE ONE (1) TABLET EACH DAY 30 tablet 5  . metoprolol succinate (TOPROL-XL) 50 MG 24 hr tablet TAKE ONE TABLET BY MOUTH EVERY NIGHT. 30 tablet 6  . Omega-3 Fatty Acids (FISH OIL) 1000 MG CAPS Take 1 capsule by mouth 2 (two) times daily.      . phenytoin (DILANTIN) 100 MG ER capsule Take 4 capsules (400 mg total) by mouth at bedtime. 120 capsule 5  . sertraline (ZOLOFT) 50 MG tablet TAKE 1 AND 1/2 TABLETS BY MOUTH ONCE DAILY 45 tablet 1  . triamcinolone cream (KENALOG) 0.1 % Apply topically daily.    [name] amoxicillin-clavulanate (AUGMENTIN) 500-125 MG per tablet Take 1 tablet (500 mg total) by mouth 3 (three) times  daily. 21 tablet 0  . mupirocin ointment (BACTROBAN) 2 % Apply to affected area bid 22 g 0  . polyethylene glycol powder (GLYCOLAX/MIRALAX) powder Take 17 g by mouth at bedtime.    . predniSONE (DELTASONE) 10 MG tablet Take 6 tablets x 1 day and then decrease by 1/2 tablet per day until down to zero mg. 39 tablet 0  . predniSONE (DELTASONE) 10 MG tablet Take 4 tabs po x 2 days, then 3 x 2 days, then 2 x 2 days, then 1 x 2 days then stop. (Patient not taking: Reported on 08/06/2014) 20  tablet 0   No facility-administered medications prior to visit.    PAST MEDICAL HISTORY: Past Medical History  Diagnosis Date  . Hypertension   . Acute bronchitis   . CVA (cerebral vascular accident)   . CAD (coronary artery disease)     s/p stent LAD 8/03 and stent placement OM1  . Allergic rhinitis   . COPD with asthma   . Chronic back pain   . Seizure disorder   . Hypercholesterolemia   . Diabetes mellitus   . Arthritis     PAST SURGICAL HISTORY: Past Surgical History  Procedure Laterality Date  . Lumbar spine surgery      x3  . Total abdominal hysterectomy      appendectomy - age 38  . Total hip arthroplasty    . Total knee arthroplasty      2005  . Coronary stent placement  8/03    LAD  . Coronary stent placement      OM1  2006  . Femur fracture surgery Right [id number]    Dr. Marry Guan    FAMILY HISTORY: Family History  Problem Relation Age of Onset  . Allergies Brother   . Cancer Brother   . Allergies Sister   . Cancer Sister     Non-hodgkins lymphoma  . Asthma Brother   . Asthma Sister   . Rheum arthritis Sister   . Rheum arthritis Brother   . Cancer Sister     non-hodgkins lymphoma  . Diabetes Brother   . Stroke Mother     SOCIAL HISTORY: History   Social History  . Marital Status: Married    Spouse Name: N/A  . Number of Children: 3  . Years of Education: hs   Occupational History  . retired Armed forces logistics/support/administrative officer   .     Social History Main Topics  . Smoking status: Never Smoker   . Smokeless tobacco: Never Used  . Alcohol Use: No  . Drug Use: No  . Sexual Activity: Not on file   Other Topics Concern  . Not on file   Social History Narrative     PHYSICAL EXAM  Filed Vitals:   08/06/14 1544  BP: 148/68  Pulse: 75  Height: 5\' 6"  (1.676 m)  Weight: 251 lb 6.4 oz (114.034 kg)   Body mass index is 40.6 kg/(m^2).  Generalized: Well developed, morbidly obese female in no acute distress  Head: normocephalic and atraumatic,. Oropharynx  benign  Neck: Supple, no carotid bruits  Musculoskeletal: No deformity  Skin 1-2+ edema below the knees bilaterally  Neurological examination   Mentation: Alert oriented to time, place, history taking. Attention span and concentration appropriate. Recent and remote memory intact.  Follows all commands speech and language fluent.   Cranial nerve II-XIPupils were equal round reactive to light extraocular movements were full, visual field were full on confrontational test. Facial sensation and strength were  normal. hearing was intact to finger rubbing bilaterally. Uvula tongue midline. head turning and shoulder shrug were normal and symmetric.Tongue protrusion into cheek strength was normal. Motor: normal bulk and tone, full strength in the BUE, BLE, fine finger movements normal, no pronator drift. No focal weakness Coordination: finger-nose-finger, performed well has problems with heel-to-shin Reflexes: Brachioradialis 2/2, biceps 2/2, triceps 2/2, patellar 2/2, Achilles 2/2, plantar responses were flexor bilaterally. Gait and Station: Rising up from seated position without assistance, wide based stance, walks with a walker no difficulty with turns, tandem gait not attempted, Romberg is negative DIAGNOSTIC DATA (LABS, IMAGING, TESTING) - I reviewed patient records, labs, notes, testing and imaging myself where available.  Lab Results  Component Value Date   WBC 5.8 12/26/2013   HGB 13.6 12/26/2013   HCT 41.5 12/26/2013   MCV 101.7* 12/26/2013   PLT 178.0 12/26/2013      Component Value Date/Time   NA 140 12/28/2013 1146   NA 132* 05/17/2012 0503   K 4.4 12/28/2013 1146   K 3.4* 05/17/2012 0503   CL 110 12/28/2013 1146   CL 101 05/17/2012 0503   CO2 21 12/28/2013 1146   CO2 25 05/17/2012 0503   GLUCOSE 133* 12/28/2013 1146   GLUCOSE 119* 05/17/2012 0503   BUN 24* 12/28/2013 1146   BUN 14 05/17/2012 0503   CREATININE 1.0 12/28/2013 1146   CREATININE 0.60 05/17/2012 0503   CALCIUM  8.8 12/28/2013 1146   CALCIUM 8.5 05/17/2012 0503   PROT 6.5 12/26/2013 0835   ALBUMIN 2.9* 12/26/2013 0835   AST 26 12/26/2013 0835   ALT 15 12/26/2013 0835   ALKPHOS 112 12/26/2013 0835   BILITOT 0.8 12/26/2013 0835   GFRNONAA >60 05/17/2012 0503   GFRAA >60 05/17/2012 0503   Lab Results  Component Value Date   CHOL 127 12/26/2013   HDL 48.60 12/26/2013   LDLCALC 65 12/26/2013   TRIG 65.0 12/26/2013   CHOLHDL 3 12/26/2013   Lab Results  Component Value Date   HGBA1C 6.1 12/26/2013       ASSESSMENT AND PLAN  80 y.o. year old female  has a past medical history of seizure disorder well-controlled on Dilantin and Keppra. Last seizure event 2004  Pt to continue Keppra at current dose, will refill Continue Dilantin at current dose, will refill Will check Dilantin level F/U in 1 year Dennie Bible, [facility], [facility], [facility] [address] [place], [place] [address] [phone]

## 2014-08-07 ENCOUNTER — Telehealth: Payer: Self-pay | Admitting: *Deleted

## 2014-08-07 LAB — PHENYTOIN LEVEL, TOTAL: Phenytoin (Dilantin), Serum: 14.4 ug/mL (ref 10.0–20.0)

## 2014-08-07 NOTE — Telephone Encounter (Signed)
Spoke to pt and relayed that Dilantin level was good.  She verbalized understanding.

## 2014-08-07 NOTE — Telephone Encounter (Signed)
-----   Message from Dennie Bible, NP sent at 08/07/2014  7:55 AM EDT ----- Good Dilantin level please call the patient

## 2014-08-15 ENCOUNTER — Ambulatory Visit: Payer: Medicare PPO | Admitting: Internal Medicine

## 2014-08-24 ENCOUNTER — Encounter: Payer: Self-pay | Admitting: Internal Medicine

## 2014-08-24 ENCOUNTER — Other Ambulatory Visit: Payer: Self-pay | Admitting: Internal Medicine

## 2014-09-04 ENCOUNTER — Other Ambulatory Visit: Payer: Self-pay | Admitting: Internal Medicine

## 2014-09-27 ENCOUNTER — Other Ambulatory Visit: Payer: Self-pay | Admitting: Internal Medicine

## 2014-09-27 NOTE — Telephone Encounter (Signed)
Sent in rx for zoloft #45 with no refills.  Needs a f/u appt.

## 2014-09-27 NOTE — Telephone Encounter (Signed)
Pt scheduled 10.14.16

## 2014-09-27 NOTE — Telephone Encounter (Signed)
Last OV 12.24.15.  Please advise refill

## 2014-10-01 ENCOUNTER — Other Ambulatory Visit: Payer: Self-pay | Admitting: Internal Medicine

## 2014-10-01 NOTE — Telephone Encounter (Signed)
Okay to refill? Last seen on: 12/28/13 & next appt on: 10/19/14

## 2014-10-01 NOTE — Telephone Encounter (Signed)
To my knowledge, another md usually refills this medication.  Please confirm.  Let me know if any problems.

## 2014-10-02 NOTE — Telephone Encounter (Signed)
Patient states that this was sent to Dr. Josefa Half. Wasn't suppose to go to [place].

## 2014-10-19 ENCOUNTER — Encounter: Payer: Self-pay | Admitting: Internal Medicine

## 2014-10-19 ENCOUNTER — Ambulatory Visit (INDEPENDENT_AMBULATORY_CARE_PROVIDER_SITE_OTHER): Payer: Medicare PPO | Admitting: Internal Medicine

## 2014-10-19 VITALS — BP 110/70 | HR 65 | Temp 98.1°F | Ht 66.0 in | Wt 246.5 lb

## 2014-10-19 DIAGNOSIS — G40909 Epilepsy, unspecified, not intractable, without status epilepticus: Secondary | ICD-10-CM

## 2014-10-19 DIAGNOSIS — I639 Cerebral infarction, unspecified: Secondary | ICD-10-CM | POA: Diagnosis not present

## 2014-10-19 DIAGNOSIS — I2510 Atherosclerotic heart disease of native coronary artery without angina pectoris: Secondary | ICD-10-CM

## 2014-10-19 DIAGNOSIS — E669 Obesity, unspecified: Secondary | ICD-10-CM

## 2014-10-19 DIAGNOSIS — I10 Essential (primary) hypertension: Secondary | ICD-10-CM | POA: Diagnosis not present

## 2014-10-19 DIAGNOSIS — Z658 Other specified problems related to psychosocial circumstances: Secondary | ICD-10-CM

## 2014-10-19 DIAGNOSIS — D7589 Other specified diseases of blood and blood-forming organs: Secondary | ICD-10-CM

## 2014-10-19 DIAGNOSIS — F439 Reaction to severe stress, unspecified: Secondary | ICD-10-CM

## 2014-10-19 DIAGNOSIS — I4891 Unspecified atrial fibrillation: Secondary | ICD-10-CM | POA: Diagnosis not present

## 2014-10-19 DIAGNOSIS — E1140 Type 2 diabetes mellitus with diabetic neuropathy, unspecified: Secondary | ICD-10-CM

## 2014-10-19 DIAGNOSIS — E7800 Pure hypercholesterolemia, unspecified: Secondary | ICD-10-CM

## 2014-10-19 DIAGNOSIS — G8929 Other chronic pain: Secondary | ICD-10-CM

## 2014-10-19 DIAGNOSIS — M549 Dorsalgia, unspecified: Secondary | ICD-10-CM

## 2014-10-19 NOTE — Progress Notes (Signed)
Pre-visit discussion using our clinic review tool. No additional management support is needed unless otherwise documented below in the visit note.  

## 2014-10-19 NOTE — Progress Notes (Signed)
Patient ID: Brooke Baker, female   DOB: 01-16-1934, 80 y.o.   MRN: [id number]   Subjective:    Patient ID: Brooke Baker, female    DOB: 1934/02/09, 80 y.o.   MRN: [id number]  HPI  Patient with past history of atrial fib, CAD, CVA, hypertension, diabetes and hypercholesterolemia who comes in today to follow up on these issues.  She is accompanied by her husband.  History obtained from both of them.  States she is doing well.  No increased cough or congestion.  Breathing has been stable.  No acid reflux.  No abdominal pain or cramping.  No bowel change.  No seizures.  No recorded sugars.  Getting around her house.  Discussed staying active.  Discussed diet and exercise.     Past Medical History  Diagnosis Date  . Hypertension   . Acute bronchitis   . CVA (cerebral vascular accident) ([place])   . CAD (coronary artery disease)     s/p stent LAD 8/03 and stent placement OM1  . Allergic rhinitis   . COPD with asthma ([place])   . Chronic back pain   . Seizure disorder ([place])   . Hypercholesterolemia   . Diabetes mellitus ([place])   . Arthritis    Past Surgical History  Procedure Laterality Date  . Lumbar spine surgery      x3  . Total abdominal hysterectomy      appendectomy - age 81  . Total hip arthroplasty    . Total knee arthroplasty      2005  . Coronary stent placement  8/03    LAD  . Coronary stent placement      OM1  2006  . Femur fracture surgery Right [id number]    Dr. Marry Guan   Family History  Problem Relation Age of Onset  . Allergies Brother   . Cancer Brother   . Allergies Sister   . Cancer Sister     Non-hodgkins lymphoma  . Asthma Brother   . Asthma Sister   . Rheum arthritis Sister   . Rheum arthritis Brother   . Cancer Sister     non-hodgkins lymphoma  . Diabetes Brother   . Stroke Mother    Social History   Social History  . Marital Status: Married    Spouse Name: N/A  . Number of Children: 3  . Years of Education: hs   Occupational History  .  retired Armed forces logistics/support/administrative officer   .     Social History Main Topics  . Smoking status: Never Smoker   . Smokeless tobacco: Never Used  . Alcohol Use: No  . Drug Use: No  . Sexual Activity: Not Asked   Other Topics Concern  . None   Social History Narrative    Outpatient Encounter Prescriptions as of 10/19/2014  Medication Sig  . albuterol (PROVENTIL) (2.5 MG/3ML) 0.083% nebulizer solution USE ONE VIAL VIA NEBULIZER EVERY FOUR HOURS AS NEEDED FOR WHEEZING  . albuterol (VENTOLIN HFA) 108 (90 BASE) MCG/ACT inhaler 2 puffs every 4 hours as directed- rescue  . amLODipine (NORVASC) 10 MG tablet TAKE ONE (1) TABLET EACH DAY  . aspirin 81 MG tablet Take 81 mg by mouth daily.    . budesonide-formoterol (SYMBICORT) 80-4.5 MCG/ACT inhaler Inhale 2 puffs into the lungs 2 (two) times daily.  . CRESTOR 20 MG tablet TAKE ONE (1) TABLET EACH DAY  . dabigatran (PRADAXA) 150 MG CAPS Take 150 mg by mouth every 12 (twelve) hours.  [name] EPINEPHrine (EPIPEN)  0.3 mg/0.3 mL DEVI Inject 0.3 mLs (0.3 mg total) into the muscle once.  . fluocinonide (LIDEX) 0.05 % external solution Use as needed  . fluticasone (FLONASE) 50 MCG/ACT nasal spray USE TWO SPRAYS IN EACH NOSTRIL EACH DAY AS DIRECTED BY PHYSICIAN  . hydrochlorothiazide (HYDRODIURIL) 25 MG tablet TAKE ONE (1) TABLET EACH DAY  . levETIRAcetam (KEPPRA) 750 MG tablet Take 1 tablet (750 mg total) by mouth 2 (two) times daily.  [name] losartan (COZAAR) 100 MG tablet TAKE ONE (1) TABLET EACH DAY  . metoprolol succinate (TOPROL-XL) 50 MG 24 hr tablet TAKE ONE TABLET BY MOUTH EVERY NIGHT.  [name] NONFORMULARY OR COMPOUNDED ITEM Allergy Vaccine 1:10 Given at Home  . Omega-3 Fatty Acids (FISH OIL) 1000 MG CAPS Take 1 capsule by mouth 2 (two) times daily.    . phenytoin (DILANTIN) 100 MG ER capsule Take 4 capsules (400 mg total) by mouth at bedtime.  . sertraline (ZOLOFT) 50 MG tablet TAKE 1 AND 1/2 TABLETS BY MOUTH ONCE DAILY  . triamcinolone cream (KENALOG) 0.1 % Apply topically  daily.   No facility-administered encounter medications on file as of 10/19/2014.    Review of Systems  Constitutional: Negative for appetite change and unexpected weight change.  HENT: Negative for congestion and sinus pressure.   Eyes: Negative for discharge and visual disturbance.  Respiratory: Negative for cough, chest tightness and shortness of breath.   Cardiovascular: Negative for chest pain, palpitations and leg swelling.  Gastrointestinal: Negative for nausea, vomiting, abdominal pain and diarrhea.  Genitourinary: Negative for dysuria and difficulty urinating.  Musculoskeletal: Negative for joint swelling.       Back stable.   Skin: Negative for color change and rash.  Neurological: Negative for dizziness, light-headedness and headaches.  Psychiatric/Behavioral: Negative for dysphoric mood and agitation.       Objective:     Blood pressure rechecked by me:  124/72  Physical Exam  Constitutional: She appears well-developed and well-nourished. No distress.  HENT:  Nose: Nose normal.  Mouth/Throat: Oropharynx is clear and moist.  Eyes: Conjunctivae are normal.  Neck: Neck supple. No thyromegaly present.  Cardiovascular: Normal rate and regular rhythm.   Pulmonary/Chest: Breath sounds normal. No respiratory distress. She has no wheezes.  Abdominal: Soft. Bowel sounds are normal. There is no tenderness.  Musculoskeletal: She exhibits no edema or tenderness.  Lymphadenopathy:    She has no cervical adenopathy.  Skin: No rash noted. No erythema.  Psychiatric: She has a normal mood and affect. Her behavior is normal.    BP 110/70 mmHg  Pulse 65  Temp(Src) 98.1 F (36.7 C) (Oral)  Ht 5' 6"  (1.676 m)  Wt 246 lb 8 oz (111.812 kg)  BMI 39.81 kg/m2  SpO2 94%  LMP 08/17/1965 Wt Readings from Last 3 Encounters:  10/19/14 246 lb 8 oz (111.812 kg)  08/06/14 251 lb 6.4 oz (114.034 kg)  05/14/14 248 lb (112.492 kg)     Lab Results  Component Value Date   WBC 5.8  12/26/2013   HGB 13.6 12/26/2013   HCT 41.5 12/26/2013   PLT 178.0 12/26/2013   GLUCOSE 133* 12/28/2013   CHOL 127 12/26/2013   TRIG 65.0 12/26/2013   HDL 48.60 12/26/2013   LDLCALC 65 12/26/2013   ALT 15 12/26/2013   AST 26 12/26/2013   NA 140 12/28/2013   K 4.4 12/28/2013   CL 110 12/28/2013   CREATININE 1.0 12/28/2013   BUN 24* 12/28/2013   CO2 21 12/28/2013   TSH 1.21  06/02/2013   INR 1.3 05/12/2012   HGBA1C 6.1 12/26/2013   MICROALBUR 1.0 05/30/2013    Dg Chest 2 View  05/30/2013  CLINICAL DATA:  Ppm asthma, worsening cough wheezing and shortness of breath, nonsmoker EXAM: CHEST  2 VIEW COMPARISON:  07/07/2010 FINDINGS: There is mild stable cardiac enlargement. There is mild diffuse interstitial prominence, stable from the prior study. No consolidation or effusion identified. There is mild hyperinflation suggesting an element of COPD. There is calcification of the aorta. IMPRESSION: Diffuse chronic interstitial change with no acute findings Electronically Signed   By: Skipper Cliche M.D.   On: 05/30/2013 17:10       Assessment & Plan:   Problem List Items Addressed This Visit    Atrial fibrillation ([place]) - Primary    On pradaxa.  Sees Dr Saralyn Pilar.  Doing well.  Rate controlled.        Relevant Orders   TSH   Chronic back pain    Stable.       Coronary atherosclerosis    Known disease.  Followed by Dr Saralyn Pilar.  Doing well.        CVA (cerebral vascular accident) ([place])    No reoccurring symptoms.  Doing well.  Follow.        Diabetes mellitus ([place])    Discussed diet and exercise.  Low carb diet and exercise.  Follow met b and a1c.        Relevant Orders   Hemoglobin A1c   Microalbumin / creatinine urine ratio   Hypercholesterolemia    Low cholesterol diet and exercise.  Follow lipid panel and liver function tests.  On crestor.        Relevant Orders   Lipid panel   Hepatic function panel   Hypertension    Blood pressure under good control.   Continue same medication regimen.  Follow pressures.  Follow metabolic panel.        Relevant Orders   CBC with Differential/Platelet   Basic metabolic panel   Macrocytosis    Recheck cbc.       Obesity    Diet and exercise.  Follow.        Seizure disorder ([place])    On dilantin and keppra.  Followed by neurology.  Just evaluated.  Stable.        Stress    Doing well.  On zoloft.  Follow.            Einar Pheasant, MD

## 2014-10-20 ENCOUNTER — Encounter: Payer: Self-pay | Admitting: Internal Medicine

## 2014-10-20 NOTE — Assessment & Plan Note (Signed)
Stable

## 2014-10-20 NOTE — Assessment & Plan Note (Signed)
Discussed diet and exercise.  Low carb diet and exercise.  Follow met b and a1c.   

## 2014-10-20 NOTE — Assessment & Plan Note (Signed)
Known disease.  Followed by Dr Saralyn Pilar.  Doing well.

## 2014-10-20 NOTE — Assessment & Plan Note (Signed)
On pradaxa.  Sees Dr Saralyn Pilar.  Doing well.  Rate controlled.

## 2014-10-20 NOTE — Assessment & Plan Note (Signed)
Blood pressure under good control.  Continue same medication regimen.  Follow pressures.  Follow metabolic panel.   

## 2014-10-20 NOTE — Assessment & Plan Note (Signed)
On dilantin and keppra.  Followed by neurology.  Just evaluated.  Stable.

## 2014-10-20 NOTE — Assessment & Plan Note (Signed)
Doing well.  On zoloft.  Follow.  

## 2014-10-20 NOTE — Assessment & Plan Note (Signed)
Low cholesterol diet and exercise.  Follow lipid panel and liver function tests.  On crestor.   

## 2014-10-20 NOTE — Assessment & Plan Note (Signed)
Recheck cbc.  

## 2014-10-20 NOTE — Assessment & Plan Note (Signed)
Diet and exercise.  Follow.  

## 2014-10-20 NOTE — Assessment & Plan Note (Signed)
No reoccurring symptoms.  Doing well.  Follow.

## 2014-10-21 ENCOUNTER — Other Ambulatory Visit: Payer: Self-pay | Admitting: Internal Medicine

## 2014-10-21 DIAGNOSIS — Z1239 Encounter for other screening for malignant neoplasm of breast: Secondary | ICD-10-CM

## 2014-10-21 NOTE — Progress Notes (Signed)
Order placed for mammogram.

## 2014-10-22 ENCOUNTER — Telehealth: Payer: Self-pay | Admitting: *Deleted

## 2014-10-22 NOTE — Telephone Encounter (Signed)
Pt has been scheduled for 10/30/14 by [facility] in referrals

## 2014-10-22 NOTE — Telephone Encounter (Signed)
-----   Message from Einar Pheasant, MD sent at 10/21/2014  2:21 AM EDT ----- Regarding: mammogram - schedule Pt needs mammogram scheduled.  Wants after 12:00.  Thanks    Dr Nicki Reaper

## 2014-10-23 ENCOUNTER — Other Ambulatory Visit: Payer: Self-pay | Admitting: Internal Medicine

## 2014-10-26 ENCOUNTER — Other Ambulatory Visit (INDEPENDENT_AMBULATORY_CARE_PROVIDER_SITE_OTHER): Payer: Medicare PPO

## 2014-10-26 DIAGNOSIS — E1140 Type 2 diabetes mellitus with diabetic neuropathy, unspecified: Secondary | ICD-10-CM | POA: Diagnosis not present

## 2014-10-26 DIAGNOSIS — I4891 Unspecified atrial fibrillation: Secondary | ICD-10-CM | POA: Diagnosis not present

## 2014-10-26 DIAGNOSIS — I2510 Atherosclerotic heart disease of native coronary artery without angina pectoris: Secondary | ICD-10-CM | POA: Diagnosis not present

## 2014-10-26 DIAGNOSIS — I10 Essential (primary) hypertension: Secondary | ICD-10-CM | POA: Diagnosis not present

## 2014-10-26 DIAGNOSIS — Z9889 Other specified postprocedural states: Secondary | ICD-10-CM | POA: Diagnosis not present

## 2014-10-26 DIAGNOSIS — E7800 Pure hypercholesterolemia, unspecified: Secondary | ICD-10-CM

## 2014-10-26 DIAGNOSIS — J439 Emphysema, unspecified: Secondary | ICD-10-CM | POA: Diagnosis not present

## 2014-10-26 LAB — CBC WITH DIFFERENTIAL/PLATELET
BASOS ABS: 0 10*3/uL (ref 0.0–0.1)
Basophils Relative: 0.3 % (ref 0.0–3.0)
EOS ABS: 0.5 10*3/uL (ref 0.0–0.7)
Eosinophils Relative: 8.6 % — ABNORMAL HIGH (ref 0.0–5.0)
HEMATOCRIT: 40.8 % (ref 36.0–46.0)
HEMOGLOBIN: 13.6 g/dL (ref 12.0–15.0)
LYMPHS PCT: 15.2 % (ref 12.0–46.0)
Lymphs Abs: 0.8 10*3/uL (ref 0.7–4.0)
MCHC: 33.3 g/dL (ref 30.0–36.0)
MCV: 99.3 fl (ref 78.0–100.0)
MONOS PCT: 13.3 % — AB (ref 3.0–12.0)
Monocytes Absolute: 0.7 10*3/uL (ref 0.1–1.0)
NEUTROS ABS: 3.4 10*3/uL (ref 1.4–7.7)
Neutrophils Relative %: 62.6 % (ref 43.0–77.0)
PLATELETS: 163 10*3/uL (ref 150.0–400.0)
RBC: 4.11 Mil/uL (ref 3.87–5.11)
RDW: 13.1 % (ref 11.5–15.5)
WBC: 5.4 10*3/uL (ref 4.0–10.5)

## 2014-10-26 LAB — MICROALBUMIN / CREATININE URINE RATIO
Creatinine,U: 114.7 mg/dL
MICROALB/CREAT RATIO: 3.7 mg/g (ref 0.0–30.0)
Microalb, Ur: 4.3 mg/dL — ABNORMAL HIGH (ref 0.0–1.9)

## 2014-10-26 LAB — LIPID PANEL
CHOLESTEROL: 121 mg/dL (ref 0–200)
HDL: 48 mg/dL (ref >39.00)
LDL CALC: 58 mg/dL (ref 0–99)
NonHDL: 72.99
TRIGLYCERIDES: 75 mg/dL (ref 0.0–149.0)
Total CHOL/HDL Ratio: 3
VLDL: 15 mg/dL (ref 0.0–40.0)

## 2014-10-26 LAB — BASIC METABOLIC PANEL
BUN: 31 mg/dL — AB (ref 6–23)
CO2: 23 mEq/L (ref 19–32)
CREATININE: 1.07 mg/dL (ref 0.40–1.20)
Calcium: 9.3 mg/dL (ref 8.4–10.5)
Chloride: 110 mEq/L (ref 96–112)
GFR: 52.67 mL/min — AB (ref >60.00)
Glucose, Bld: 113 mg/dL — ABNORMAL HIGH (ref 70–99)
POTASSIUM: 4.7 meq/L (ref 3.5–5.1)
Sodium: 141 mEq/L (ref 135–145)

## 2014-10-26 LAB — HEPATIC FUNCTION PANEL
ALBUMIN: 3.5 g/dL (ref 3.5–5.2)
ALT: 15 U/L (ref 0–35)
AST: 24 U/L (ref 0–37)
Alkaline Phosphatase: 102 U/L (ref 39–117)
Bilirubin, Direct: 0.2 mg/dL (ref 0.0–0.3)
TOTAL PROTEIN: 7.4 g/dL (ref 6.0–8.3)
Total Bilirubin: 0.6 mg/dL (ref 0.2–1.2)

## 2014-10-26 LAB — HEMOGLOBIN A1C: HEMOGLOBIN A1C: 5.7 % (ref 4.6–6.5)

## 2014-10-26 LAB — TSH: TSH: 2.04 u[IU]/mL (ref 0.35–4.50)

## 2014-10-29 ENCOUNTER — Encounter: Payer: Self-pay | Admitting: *Deleted

## 2014-10-30 ENCOUNTER — Other Ambulatory Visit: Payer: Self-pay | Admitting: Internal Medicine

## 2014-10-30 ENCOUNTER — Ambulatory Visit
Admission: RE | Admit: 2014-10-30 | Discharge: 2014-10-30 | Disposition: A | Discharge status: Home / Self Care | Payer: Medicare PPO | Source: Ambulatory Visit | Attending: Internal Medicine | Admitting: Internal Medicine

## 2014-10-30 DIAGNOSIS — Z1231 Encounter for screening mammogram for malignant neoplasm of breast: Secondary | ICD-10-CM | POA: Diagnosis not present

## 2014-10-30 DIAGNOSIS — Z1239 Encounter for other screening for malignant neoplasm of breast: Secondary | ICD-10-CM

## 2014-10-30 HISTORY — DX: Malignant (primary) neoplasm, unspecified: C80.1

## 2014-10-31 ENCOUNTER — Encounter: Payer: Self-pay | Admitting: Internal Medicine

## 2014-10-31 DIAGNOSIS — Z0000 Encounter for general adult medical examination without abnormal findings: Secondary | ICD-10-CM | POA: Insufficient documentation

## 2014-11-01 ENCOUNTER — Other Ambulatory Visit: Payer: Self-pay | Admitting: Internal Medicine

## 2014-11-19 ENCOUNTER — Ambulatory Visit (INDEPENDENT_AMBULATORY_CARE_PROVIDER_SITE_OTHER): Payer: Medicare PPO | Admitting: Internal Medicine

## 2014-11-19 ENCOUNTER — Encounter: Payer: Self-pay | Admitting: Internal Medicine

## 2014-11-19 VITALS — BP 122/62 | HR 76 | Ht 66.0 in | Wt 245.8 lb

## 2014-11-19 DIAGNOSIS — I482 Chronic atrial fibrillation, unspecified: Secondary | ICD-10-CM

## 2014-11-19 DIAGNOSIS — J3089 Other allergic rhinitis: Secondary | ICD-10-CM

## 2014-11-19 DIAGNOSIS — J309 Allergic rhinitis, unspecified: Secondary | ICD-10-CM

## 2014-11-19 DIAGNOSIS — J209 Acute bronchitis, unspecified: Secondary | ICD-10-CM | POA: Diagnosis not present

## 2014-11-19 DIAGNOSIS — J302 Other seasonal allergic rhinitis: Secondary | ICD-10-CM

## 2014-11-19 MED ORDER — AZITHROMYCIN 250 MG PO TABS: ORAL_TABLET | ORAL | Status: DC

## 2014-11-19 NOTE — Assessment & Plan Note (Signed)
No serious effort at weight loss. She had her husband are both heavy, implying long-established lifestyle. Plan-recommended weight loss

## 2014-11-19 NOTE — Patient Instructions (Addendum)
Script to hold for Zpak antibiotic

## 2014-11-19 NOTE — Assessment & Plan Note (Signed)
Controlled ventricular response rate at this visit

## 2014-11-19 NOTE — Assessment & Plan Note (Signed)
She insists allergy vaccine remains helpful. Plan-continue allergy vaccine 1 more year then reconsider

## 2014-11-19 NOTE — Progress Notes (Signed)
Patient ID: Brooke Baker, female    DOB: 1934-08-27, 80 y.o.   MRN: [id number]  Asthma Her past medical history is significant for asthma.   07/07/10- 76 yoF never smoker followed for Asthma, allergic rhinitis, complicated by hx CAD, CVA. Last here January 08, 2010 - note reviewed.  She is staying in mostly, in hot weather, but tanned from tanning bed for psoriasis. Has been more aware of dyspnea for months, more with exertion than at rest. She has noted a little wheeze, but main concern is with her heart. Can't sleep supine due to dyspnea, but better on either side. Nocturia x 2. Denies productive cough,  chest pain or palpitation. She has cardiologist at [facility]. PCP is Dr Einar Pheasant, who saw her recently and has ordered blood work, pending.  No recent CXR.  Continues allergy vaccine- feels the shots  have helped her. 1:10 GO.  Using ventolin rescue inhaler 3-4 x recently, symbicort twice daily. Not on prednisone now.    01/12/11- 76 yoF never smoker followed for Asthma, allergic rhinitis, complicated by hx CAD, CVA. Acute visit-having increased SOB, wheezing, cough-productive-yellow/green in color She slipped and fell at home several days ago and has 2 black eyes, on Plavix. She was evaluated with no serious trauma found. Has had flu vaccine. Continues allergy vaccine. For past 3 or 4 days has had increased shortness of breath, wheeze, cough with greenish sputum. Denies fever, sore throat, GI upset. Sleeping in recliner because of postnasal drip. Denies ankle edema.  01/04/12- 41 yoF never smoker followed for Asthma, allergic rhinitis, complicated by hx CAD, CVA. FOLLOWS FOR: doing well since last visit; no longer having the wheezing. Was treated at a walk in clinic during the fall season, for bronchitis. Occasional episode of cough or wheeze. Now on Pradaxa for AFib Continues allergy vaccine 1:10. Family gives w/o problem.  04/25/12 Acute OV  Complains of increased SOB,  wheezing, prod cough with brown/yellow mucus x3days - No fever or chest pain. Appetite is good. No n/v.  No otc used.  No recent travel or abx use.  Cough is getting worse. More wheezing this am.  >Zpack   05/11/13- 78 yoF never smoker followed for Asthma, allergic rhinitis, complicated by hx DM, CAD, CVA/AFib ACUTE VISIT:  Increased SOB, wheezing, PND and cough with clear mucus worsening over past 2 days.  Allergy vaccine 1:10 by family, doing well (pt does at home) >pred taper /depo medrol .    05/30/2013 Acute OV (Asthma/AR-never smoker )  Complains of  wheezing, increased SOB, chest tightness, some cough with light yellow mucus x2 weeks.   Denies f/c/s, head congestion, hemoptysis, nausea, vomiting.   Finished Doxycycline last week. Has been on 2 steroid taper over last month. Gets some better but never gets over totally.  Remains on Symbicort. 80/4.65mcg .   Ankles more swollen. On norvasc 10mg  and HCTZ 25mg  .  No wt gain.  Eating good. No n/v/d.  No sinus congestion.   05/13/13- 78 yoF never smoker followed for Asthma, allergic rhinitis, complicated by hx DM, CAD, CVA/AFib FOLLOWS FOR Pt c/o increased SOB, chest tightness, wheezing, and productive cough x  days. Denies f/c/s, n/v  Describes asthma attack 3 days ago blamed on pollen and whether. She slept up in her recliner and used her rescue inhaler appropriately. Now much improved. Allergy vaccine 1:10 GO without problems. CXR 05/30/13 image reviewed IMPRESSION: Diffuse chronic interstitial change with no acute findings Electronically Signed  By: Elodia Florence.D.  On: 05/30/2013 17:10  11/19/14- 80 year old female never smoker followed for asthma, allergic rhinitis, complicated by history DM, CAD, CVA/ AFib Allergy vaccine 1:10 GO Follows for asthma and allergies. Pt c/o cough with yellow mucus, wheeze and SOB since last night. Pt denies f/n/v/d. Pt did use albuterol HFA last night with improvement in symptoms. Husband  here Exposed to sick family members. Now acutely ill with increased cough 1 week, worse last night, yellow.  Review of Systems-see HPI Constitutional:   No-   weight loss, night sweats, fevers, chills, + fatigue, lassitude. HEENT:   No-  headaches, difficulty swallowing, tooth/dental problems, sore throat,       No-  sneezing, itching, ear ache, nasal congestion, CV:  No-   chest pain,  +orthopnea, PND, swelling in lower extremities, anasarca,  dizziness, palpitations Resp: +shortness of breath with exertion or at rest.             +  productive cough,  + non-productive cough,  No- coughing up of blood.            + change in color of mucus.  + wheezing.   Skin: No-   rash or lesions. GI:  No-   heartburn, indigestion, abdominal pain, nausea, vomiting,  GU: MS:  No-   joint pain or swelling.  . Neuro-     nothing unusual Psych:  No- change in mood or affect. No depression or anxiety.  No memory loss.   Objective:   Physical Exam General- Alert, Oriented, Affect-appropriate, Distress- none acute,   +obese Skin- rash-none, lesions- none, excoriation- none Lymphadenopathy- none Head- atraumatic            Eyes- Gross vision intact, PERRLA, conjunctivae clear secretions. Bilateral shiners            Ears- Hearing, canals-normal            Nose- Sniffing +, no-Septal dev, mucus, polyps, erosion, perforation             Throat- Mallampati II , mucosa clear , drainage- none, tonsils- atrophic Neck- flexible , trachea midline, no stridor , thyroid nl, carotid no bruit Chest - symmetrical excursion , unlabored           Heart/CV-+ IRR , no murmur , no gallop  , no rub, nl s1 s2                           - JVD- none , edema- 1+ , stasis changes- none, varices- none           Lung-     breath sounds + coarse, wheeze-none            Chest wall-  Abd-  Br/ Gen/ Rectal- Not done, not indicated Extrem- cyanosis- none, clubbing, none, atrophy- none, strength- nl, + heavy legs with edema,            Neuro- grossly intact to observation

## 2014-11-19 NOTE — Assessment & Plan Note (Signed)
Likely viral. We discussed management. Plan-Z-Pak to hold in case this becomes more purulent

## 2014-11-30 ENCOUNTER — Other Ambulatory Visit: Payer: Self-pay | Admitting: Internal Medicine

## 2014-12-10 ENCOUNTER — Telehealth: Payer: Self-pay | Admitting: Internal Medicine

## 2014-12-10 MED ORDER — PREDNISONE 10 MG PO TABS: ORAL_TABLET | ORAL | Status: DC

## 2014-12-10 NOTE — Telephone Encounter (Signed)
Prednisone 10 mg, # 20, 4 X 2 DAYS, 3 X 2 DAYS, 2 X 2 DAYS, 1 X 2 DAYS  

## 2014-12-10 NOTE — Telephone Encounter (Signed)
Called spoke with pt. C/o increase SOB w/ exertion, blowing out yellow phlem, prod cough (yellow phlem), wheezing, chest tx, nasal cong x yesterday. Pt wants prednisone called in. Please advise Dr. Annamaria Boots thanks.

## 2014-12-10 NOTE — Telephone Encounter (Signed)
Pt aware of recs. RX sent in. Nothing further needed 

## 2014-12-17 ENCOUNTER — Telehealth: Payer: Self-pay | Admitting: Internal Medicine

## 2014-12-17 MED ORDER — PREDNISONE 10 MG PO TABS: ORAL_TABLET | ORAL | Status: DC

## 2014-12-17 NOTE — Telephone Encounter (Signed)
Spoke with pt and advised that rx for Prednisone was sent to pharmacy.

## 2014-12-17 NOTE — Telephone Encounter (Signed)
Spoke with pt. Still reports SOB, coughing and sinus congestion. Mucus is yellow at times. Finished antibiotic that was sent in last week. Wants a prednisone sent in.  Allergies  Allergen Reactions  . Doxycycline Diarrhea and Nausea Only     CY - please advise. Thanks.

## 2014-12-17 NOTE — Telephone Encounter (Signed)
Ok prednisone 10 mg, # 20, 4 X 2 DAYS, 3 X 2 DAYS, 2 X 2 DAYS, 1 X 2 DAYS  

## 2014-12-29 ENCOUNTER — Other Ambulatory Visit: Payer: Self-pay | Admitting: Internal Medicine

## 2015-01-01 NOTE — Telephone Encounter (Signed)
Please advise 

## 2015-01-01 NOTE — Telephone Encounter (Signed)
ok'd refill zoloft #45 with 3 refills.

## 2015-01-08 DIAGNOSIS — I10 Essential (primary) hypertension: Secondary | ICD-10-CM | POA: Diagnosis not present

## 2015-01-08 DIAGNOSIS — I4891 Unspecified atrial fibrillation: Secondary | ICD-10-CM | POA: Diagnosis not present

## 2015-01-08 DIAGNOSIS — F330 Major depressive disorder, recurrent, mild: Secondary | ICD-10-CM | POA: Diagnosis not present

## 2015-01-08 DIAGNOSIS — G40909 Epilepsy, unspecified, not intractable, without status epilepticus: Secondary | ICD-10-CM | POA: Diagnosis not present

## 2015-01-08 DIAGNOSIS — E119 Type 2 diabetes mellitus without complications: Secondary | ICD-10-CM | POA: Diagnosis not present

## 2015-01-08 DIAGNOSIS — I2510 Atherosclerotic heart disease of native coronary artery without angina pectoris: Secondary | ICD-10-CM | POA: Diagnosis not present

## 2015-01-08 DIAGNOSIS — J42 Unspecified chronic bronchitis: Secondary | ICD-10-CM | POA: Diagnosis not present

## 2015-01-08 DIAGNOSIS — E785 Hyperlipidemia, unspecified: Secondary | ICD-10-CM | POA: Diagnosis not present

## 2015-01-24 ENCOUNTER — Other Ambulatory Visit: Payer: Self-pay | Admitting: Internal Medicine

## 2015-01-30 ENCOUNTER — Other Ambulatory Visit: Payer: Self-pay | Admitting: Internal Medicine

## 2015-01-30 ENCOUNTER — Ambulatory Visit: Payer: Medicare PPO

## 2015-02-08 ENCOUNTER — Other Ambulatory Visit: Payer: Self-pay | Admitting: Internal Medicine

## 2015-02-19 ENCOUNTER — Telehealth: Payer: Self-pay | Admitting: Internal Medicine

## 2015-02-19 ENCOUNTER — Ambulatory Visit: Payer: Medicare PPO | Admitting: Internal Medicine

## 2015-02-19 NOTE — Telephone Encounter (Signed)
[phone] T[place] calling back

## 2015-02-19 NOTE — Telephone Encounter (Signed)
Offer Zpak 

## 2015-02-19 NOTE — Telephone Encounter (Signed)
LMTCB x 1 

## 2015-02-19 NOTE — Telephone Encounter (Signed)
Called pt at home # and [facility] x1 Called # [phone] below and NA, VM full. WCB

## 2015-02-19 NOTE — Telephone Encounter (Signed)
Called spoke with pt. She c/o increase SOB, wheezing, prod cough (yellow phlem), runny nose, PND x couple days. No f/c/s/n/v. Pt is taking mucinex. Wants something called in. Also requests we call her back at [phone] Please advise Dr. Annamaria Boots thanks  Allergies  Allergen Reactions  . Doxycycline Diarrhea and Nausea Only     Current Outpatient Prescriptions on File Prior to Visit  Medication Sig Dispense Refill  . albuterol (PROVENTIL) (2.5 MG/3ML) 0.083% nebulizer solution USE ONE VIAL VIA NEBULIZER EVERY FOUR HOURS AS NEEDED FOR WHEEZING 180 mL 6  . albuterol (VENTOLIN HFA) 108 (90 BASE) MCG/ACT inhaler 2 puffs every 4 hours as directed- rescue 18 g prn  . amLODipine (NORVASC) 10 MG tablet TAKE ONE (1) TABLET EACH DAY 30 tablet 0  . aspirin 81 MG tablet Take 81 mg by mouth daily.      [name] azithromycin (ZITHROMAX) 250 MG tablet 2 today then one daily 6 tablet 0  . budesonide-formoterol (SYMBICORT) 80-4.5 MCG/ACT inhaler Inhale 2 puffs into the lungs 2 (two) times daily. 1 Inhaler 11  . dabigatran (PRADAXA) 150 MG CAPS Take 150 mg by mouth every 12 (twelve) hours.    [name] EPINEPHrine (EPIPEN) 0.3 mg/0.3 mL DEVI Inject 0.3 mLs (0.3 mg total) into the muscle once. 1 Device prn  . fluocinonide (LIDEX) 0.05 % external solution Use as needed    . fluticasone (FLONASE) 50 MCG/ACT nasal spray USE TWO SPRAYS IN EACH NOSTRIL EACH DAY AS DIRECTED BY PHYSICIAN 16 g 3  . hydrochlorothiazide (HYDRODIURIL) 25 MG tablet TAKE ONE (1) TABLET EACH DAY 30 tablet 2  . levETIRAcetam (KEPPRA) 750 MG tablet Take 1 tablet (750 mg total) by mouth 2 (two) times daily. 60 tablet 11  . losartan (COZAAR) 100 MG tablet TAKE ONE (1) TABLET BY MOUTH EVERY DAY 30 tablet 11  . metoprolol succinate (TOPROL-XL) 50 MG 24 hr tablet TAKE ONE TABLET BY MOUTH EVERY NIGHT. 30 tablet 3  . NONFORMULARY OR COMPOUNDED ITEM Allergy Vaccine 1:10 Given at Home    . Omega-3 Fatty Acids (FISH OIL) 1000 MG CAPS Take 1 capsule by mouth 2 (two) times  daily.      . phenytoin (DILANTIN) 100 MG ER capsule Take 4 capsules (400 mg total) by mouth at bedtime. 120 capsule 11  . predniSONE (DELTASONE) 10 MG tablet Take 4 tabs daily x 2 days, 3 tabs daily x 2 days, 2 tabs daily x 2 days, 1 tab daily x 2 days then stop 20 tablet 0  . rosuvastatin (CRESTOR) 20 MG tablet TAKE ONE (1) TABLET EACH DAY 30 tablet 5  . sertraline (ZOLOFT) 50 MG tablet Take 1 1/2 tablet q day 45 tablet 3  . triamcinolone cream (KENALOG) 0.1 % Apply topically daily.     No current facility-administered medications on file prior to visit.

## 2015-02-20 MED ORDER — AZITHROMYCIN 250 MG PO TABS: 250.0000 mg | ORAL_TABLET | ORAL | Status: DC

## 2015-02-20 NOTE — Telephone Encounter (Signed)
[phone], pt cb

## 2015-02-20 NOTE — Telephone Encounter (Signed)
Spoke with the pt and notified per CDY- call in zpack  Pt verbalized understanding and rx was sent  Nothing further needed

## 2015-02-20 NOTE — Telephone Encounter (Signed)
lmtcb x2 for pt. 

## 2015-02-25 ENCOUNTER — Telehealth: Payer: Self-pay | Admitting: Internal Medicine

## 2015-02-25 NOTE — Telephone Encounter (Signed)
LMOM asking pt. To call back and verify address.

## 2015-02-26 DIAGNOSIS — J309 Allergic rhinitis, unspecified: Secondary | ICD-10-CM | POA: Diagnosis not present

## 2015-02-26 NOTE — Telephone Encounter (Addendum)
Allergy Serum Extract Date Mixed: 02/26/15 Vial: 1 Strength: 1:10 Here/Mail/Pick Up: mail Mixed By: tbs Last OV: 11/19/14 Pending OV: 11/21/15  Called pt. To let her know I mailed her vac. Today she should get it tomorrow if not today.

## 2015-02-26 NOTE — Telephone Encounter (Signed)
Spoke with pt to verify address on file- the listed address is in fact correct.  Tammy please advise when this vaccine is mailed.  Thanks!

## 2015-03-06 ENCOUNTER — Other Ambulatory Visit: Payer: Self-pay | Admitting: Internal Medicine

## 2015-03-20 DIAGNOSIS — C44319 Basal cell carcinoma of skin of other parts of face: Secondary | ICD-10-CM | POA: Diagnosis not present

## 2015-03-20 DIAGNOSIS — L400 Psoriasis vulgaris: Secondary | ICD-10-CM | POA: Diagnosis not present

## 2015-03-20 DIAGNOSIS — C44719 Basal cell carcinoma of skin of left lower limb, including hip: Secondary | ICD-10-CM | POA: Diagnosis not present

## 2015-03-20 DIAGNOSIS — L821 Other seborrheic keratosis: Secondary | ICD-10-CM | POA: Diagnosis not present

## 2015-03-20 DIAGNOSIS — D485 Neoplasm of uncertain behavior of skin: Secondary | ICD-10-CM | POA: Diagnosis not present

## 2015-03-21 ENCOUNTER — Other Ambulatory Visit: Payer: Self-pay | Admitting: Internal Medicine

## 2015-03-21 MED ORDER — BUDESONIDE-FORMOTEROL FUMARATE 80-4.5 MCG/ACT IN AERO: 2.0000 | INHALATION_SPRAY | Freq: Two times a day (BID) | RESPIRATORY_TRACT | Status: DC

## 2015-03-21 NOTE — Telephone Encounter (Signed)
Pr CY-okay to refill x 1 year.

## 2015-04-02 DIAGNOSIS — I2510 Atherosclerotic heart disease of native coronary artery without angina pectoris: Secondary | ICD-10-CM | POA: Diagnosis not present

## 2015-04-02 DIAGNOSIS — L409 Psoriasis, unspecified: Secondary | ICD-10-CM | POA: Diagnosis not present

## 2015-04-02 DIAGNOSIS — I10 Essential (primary) hypertension: Secondary | ICD-10-CM | POA: Diagnosis not present

## 2015-04-02 DIAGNOSIS — Z840 Family history of diseases of the skin and subcutaneous tissue: Secondary | ICD-10-CM | POA: Diagnosis not present

## 2015-04-02 DIAGNOSIS — Z7901 Long term (current) use of anticoagulants: Secondary | ICD-10-CM | POA: Diagnosis not present

## 2015-04-02 DIAGNOSIS — L4050 Arthropathic psoriasis, unspecified: Secondary | ICD-10-CM | POA: Diagnosis not present

## 2015-04-26 DIAGNOSIS — C44319 Basal cell carcinoma of skin of other parts of face: Secondary | ICD-10-CM | POA: Diagnosis not present

## 2015-04-26 DIAGNOSIS — C44712 Basal cell carcinoma of skin of right lower limb, including hip: Secondary | ICD-10-CM | POA: Diagnosis not present

## 2015-05-05 DIAGNOSIS — K1230 Oral mucositis (ulcerative), unspecified: Secondary | ICD-10-CM | POA: Diagnosis not present

## 2015-05-05 DIAGNOSIS — B370 Candidal stomatitis: Secondary | ICD-10-CM | POA: Diagnosis not present

## 2015-05-06 DIAGNOSIS — E7800 Pure hypercholesterolemia, unspecified: Secondary | ICD-10-CM | POA: Diagnosis not present

## 2015-05-06 DIAGNOSIS — I10 Essential (primary) hypertension: Secondary | ICD-10-CM | POA: Diagnosis not present

## 2015-05-06 DIAGNOSIS — J410 Simple chronic bronchitis: Secondary | ICD-10-CM | POA: Diagnosis not present

## 2015-05-06 DIAGNOSIS — Z7901 Long term (current) use of anticoagulants: Secondary | ICD-10-CM | POA: Diagnosis not present

## 2015-05-06 DIAGNOSIS — I2510 Atherosclerotic heart disease of native coronary artery without angina pectoris: Secondary | ICD-10-CM | POA: Diagnosis not present

## 2015-05-06 DIAGNOSIS — Z9889 Other specified postprocedural states: Secondary | ICD-10-CM | POA: Diagnosis not present

## 2015-05-07 ENCOUNTER — Other Ambulatory Visit: Payer: Self-pay | Admitting: Internal Medicine

## 2015-05-07 ENCOUNTER — Encounter: Payer: Self-pay | Admitting: Internal Medicine

## 2015-05-07 ENCOUNTER — Ambulatory Visit (INDEPENDENT_AMBULATORY_CARE_PROVIDER_SITE_OTHER): Payer: Medicare PPO | Admitting: Internal Medicine

## 2015-05-07 VITALS — BP 110/50 | HR 82 | Temp 97.7°F | Resp 18 | Ht 65.0 in | Wt 237.2 lb

## 2015-05-07 DIAGNOSIS — I482 Chronic atrial fibrillation, unspecified: Secondary | ICD-10-CM

## 2015-05-07 DIAGNOSIS — F439 Reaction to severe stress, unspecified: Secondary | ICD-10-CM

## 2015-05-07 DIAGNOSIS — I639 Cerebral infarction, unspecified: Secondary | ICD-10-CM | POA: Diagnosis not present

## 2015-05-07 DIAGNOSIS — J45998 Other asthma: Secondary | ICD-10-CM

## 2015-05-07 DIAGNOSIS — L4050 Arthropathic psoriasis, unspecified: Secondary | ICD-10-CM

## 2015-05-07 DIAGNOSIS — Z658 Other specified problems related to psychosocial circumstances: Secondary | ICD-10-CM

## 2015-05-07 DIAGNOSIS — Z0000 Encounter for general adult medical examination without abnormal findings: Secondary | ICD-10-CM

## 2015-05-07 DIAGNOSIS — G40909 Epilepsy, unspecified, not intractable, without status epilepticus: Secondary | ICD-10-CM

## 2015-05-07 DIAGNOSIS — M549 Dorsalgia, unspecified: Secondary | ICD-10-CM

## 2015-05-07 DIAGNOSIS — E7800 Pure hypercholesterolemia, unspecified: Secondary | ICD-10-CM

## 2015-05-07 DIAGNOSIS — E1140 Type 2 diabetes mellitus with diabetic neuropathy, unspecified: Secondary | ICD-10-CM

## 2015-05-07 DIAGNOSIS — I10 Essential (primary) hypertension: Secondary | ICD-10-CM | POA: Diagnosis not present

## 2015-05-07 DIAGNOSIS — G8929 Other chronic pain: Secondary | ICD-10-CM

## 2015-05-07 NOTE — Assessment & Plan Note (Addendum)
Physical today 05/07/15.  Mammogram 10/30/14 - Birads I.  Discussed colonoscopy.  States saw GI.  Did not want to pursue.  Discussed cologuard.

## 2015-05-07 NOTE — Progress Notes (Signed)
Patient ID: Brooke Baker, female   DOB: 1934/05/12, 81 y.o.   MRN: [id number]   Subjective:    Patient ID: Brooke Baker, female    DOB: October 28, 1934, 81 y.o.   MRN: [id number]  HPI  Patient here for her physical exam.  She is accompanied by her husband.  History obtained from both of them.  She recently saw rheumatology.  Started on MTX.  Developed mouth sores and ulcerations.  Using Dukes Mouthwash.  Is better.  Has f/u in May.  Off MTX.  Her breathing is stable.  No increased cough and congestion.  No acid reflux.  No abdominal pain or cramping.  Bowels stable.  Her main complaint is that of back pain.  Persistent.  Request to see Dr Sharlet Salina.  Husband sees him.     Past Medical History  Diagnosis Date  . Hypertension   . Acute bronchitis   . CVA (cerebral vascular accident) ([place])   . CAD (coronary artery disease)     s/p stent LAD 8/03 and stent placement OM1  . Allergic rhinitis   . COPD with asthma ([place])   . Chronic back pain   . Seizure disorder ([place])   . Hypercholesterolemia   . Diabetes mellitus ([place])   . Arthritis   . Cancer [facility])     skin   Past Surgical History  Procedure Laterality Date  . Lumbar spine surgery      x3  . Total abdominal hysterectomy      appendectomy - age 29  . Total hip arthroplasty    . Total knee arthroplasty      2005  . Coronary stent placement  8/03    LAD  . Coronary stent placement      OM1  2006  . Femur fracture surgery Right [id number]    Dr. Marry Guan  . Breast biopsy Bilateral     axillas   Family History  Problem Relation Age of Onset  . Allergies Brother   . Cancer Brother   . Allergies Sister   . Cancer Sister     Non-hodgkins lymphoma  . Asthma Brother   . Asthma Sister   . Rheum arthritis Sister   . Rheum arthritis Brother   . Cancer Sister     non-hodgkins lymphoma  . Diabetes Brother   . Stroke Mother    Social History   Social History  . Marital Status: Married    Spouse Name: N/A  . Number of Children: 3   . Years of Education: hs   Occupational History  . retired [employer] logistics/support/administrative officer   .     Social History Main Topics  . Smoking status: Never Smoker   . Smokeless tobacco: Never Used  . Alcohol Use: No  . Drug Use: No  . Sexual Activity: Not Asked   Other Topics Concern  . None   Social History Narrative    Outpatient Encounter Prescriptions as of 05/07/2015  Medication Sig  . albuterol (PROVENTIL) (2.5 MG/3ML) 0.083% nebulizer solution USE ONE VIAL VIA NEBULIZER EVERY FOUR HOURS AS NEEDED FOR WHEEZING  . albuterol (VENTOLIN HFA) 108 (90 BASE) MCG/ACT inhaler 2 puffs every 4 hours as directed- rescue  . amLODipine (NORVASC) 10 MG tablet TAKE ONE (1) TABLET EACH DAY  . aspirin 81 MG tablet Take 81 mg by mouth daily.    . budesonide-formoterol (SYMBICORT) 80-4.5 MCG/ACT inhaler Inhale 2 puffs into the lungs 2 (two) times daily.  . dabigatran (PRADAXA) 150 MG CAPS  Take 150 mg by mouth every 12 (twelve) hours.  [name] EPINEPHrine (EPIPEN) 0.3 mg/0.3 mL DEVI Inject 0.3 mLs (0.3 mg total) into the muscle once.  . fluocinonide (LIDEX) 0.05 % external solution Use as needed  . fluticasone (FLONASE) 50 MCG/ACT nasal spray USE TWO SPRAYS IN EACH NOSTRIL EACH DAY AS DIRECTED BY PHYSICIAN  . hydrochlorothiazide (HYDRODIURIL) 25 MG tablet TAKE ONE (1) TABLET EACH DAY  . levETIRAcetam (KEPPRA) 750 MG tablet Take 1 tablet (750 mg total) by mouth 2 (two) times daily.  [name] losartan (COZAAR) 100 MG tablet TAKE ONE (1) TABLET BY MOUTH EVERY DAY  . metoprolol succinate (TOPROL-XL) 50 MG 24 hr tablet TAKE ONE TABLET BY MOUTH EVERY NIGHT  . NONFORMULARY OR COMPOUNDED ITEM Allergy Vaccine 1:10 Given at Home  . Omega-3 Fatty Acids (FISH OIL) 1000 MG CAPS Take 1 capsule by mouth 2 (two) times daily.    . phenytoin (DILANTIN) 100 MG ER capsule Take 4 capsules (400 mg total) by mouth at bedtime.  . rosuvastatin (CRESTOR) 20 MG tablet TAKE ONE (1) TABLET EACH DAY  . sertraline (ZOLOFT) 50 MG tablet TAKE 1 AND 1/2 TABLETS  BY MOUTH ONCE DAILY  . triamcinolone cream (KENALOG) 0.1 % Apply topically daily.  . [DISCONTINUED] azithromycin (ZITHROMAX) 250 MG tablet 2 today then one daily  . [DISCONTINUED] azithromycin (ZITHROMAX) 250 MG tablet Take 1 tablet (250 mg total) by mouth as directed.  . [DISCONTINUED] predniSONE (DELTASONE) 10 MG tablet Take 4 tabs daily x 2 days, 3 tabs daily x 2 days, 2 tabs daily x 2 days, 1 tab daily x 2 days then stop   No facility-administered encounter medications on file as of 05/07/2015.    Review of Systems  Constitutional: Negative for appetite change and unexpected weight change.  HENT: Negative for congestion and sinus pressure.   Eyes: Negative for pain and visual disturbance.  Respiratory: Negative for cough, chest tightness and shortness of breath.   Cardiovascular: Negative for chest pain and palpitations.       No increased lower extremity edema.    Gastrointestinal: Negative for nausea, vomiting, abdominal pain and diarrhea.  Genitourinary: Negative for dysuria and difficulty urinating.  Musculoskeletal: Positive for back pain. Negative for myalgias.  Skin: Negative for color change and rash.  Neurological: Negative for dizziness, light-headedness and headaches.  Hematological: Negative for adenopathy. Does not bruise/bleed easily.  Psychiatric/Behavioral: Negative for dysphoric mood and agitation.       Objective:    Physical Exam  Constitutional: She is oriented to person, place, and time. She appears well-developed and well-nourished. No distress.  HENT:  Nose: Nose normal.  Mouth/Throat: Oropharynx is clear and moist.  Eyes: Right eye exhibits no discharge. Left eye exhibits no discharge. No scleral icterus.  Neck: Neck supple. No thyromegaly present.  Cardiovascular: Normal rate and regular rhythm.   Pulmonary/Chest: Breath sounds normal. No accessory muscle usage. No tachypnea. No respiratory distress. She has no decreased breath sounds. She has no  wheezes. She has no rhonchi. Right breast exhibits no inverted nipple, no mass, no nipple discharge and no tenderness (no axillary adenopathy). Left breast exhibits no inverted nipple, no mass, no nipple discharge and no tenderness (no axilarry adenopathy).  Abdominal: Soft. Bowel sounds are normal. There is no tenderness.  Musculoskeletal: She exhibits no tenderness.  No increased edema.   Lymphadenopathy:    She has no cervical adenopathy.  Neurological: She is alert and oriented to person, place, and time.  Skin: Skin is warm.  No rash noted. No erythema.  Psychiatric: She has a normal mood and affect. Her behavior is normal.    BP 110/50 mmHg  Pulse 82  Temp(Src) 97.7 F (36.5 C) (Oral)  Resp 18  Ht _0  (1.651 m)  Wt 237 lb 4 oz (107.616 kg)  BMI 39.48 kg/m2  SpO2 97%  LMP 08/17/1965 Wt Readings from Last 3 Encounters:  05/07/15 237 lb 4 oz (107.616 kg)  11/19/14 245 lb 12.8 oz (111.494 kg)  10/19/14 246 lb 8 oz (111.812 kg)     Lab Results  Component Value Date   WBC 5.4 10/26/2014   HGB 13.6 10/26/2014   HCT 40.8 10/26/2014   PLT 163.0 10/26/2014   GLUCOSE 113* 10/26/2014   CHOL 121 10/26/2014   TRIG 75.0 10/26/2014   HDL 48.00 10/26/2014   LDLCALC 58 10/26/2014   ALT 15 10/26/2014   AST 24 10/26/2014   NA 141 10/26/2014   K 4.7 10/26/2014   CL 110 10/26/2014   CREATININE 1.07 10/26/2014   BUN 31* 10/26/2014   CO2 23 10/26/2014   TSH 2.04 10/26/2014   INR 1.3 05/12/2012   HGBA1C 5.7 10/26/2014   MICROALBUR 4.3* 10/26/2014    Mm Screening Breast Tomo Bilateral  10/30/2014  CLINICAL DATA:  Screening. EXAM: DIGITAL SCREENING BILATERAL MAMMOGRAM WITH 3D TOMO WITH CAD COMPARISON:  Previous exam(s). ACR Breast Density Category c: The breast tissue is heterogeneously dense, which may obscure small masses. FINDINGS: There are no findings suspicious for malignancy. Images were processed with CAD. IMPRESSION: No mammographic evidence of malignancy. A result letter  of this screening mammogram will be mailed directly to the patient. RECOMMENDATION: Screening mammogram in one year. (Code:SM-B-01Y) BI-RADS CATEGORY  1: Negative. Electronically Signed   By: Nolon Nations M.D.   On: 10/30/2014 17:05       Assessment & Plan:   Problem List Items Addressed This Visit    Allergic-infective asthma    Breathing stable.       Atrial fibrillation (HCC)    Heart rate controlled.  Follow.  Currently asymptomatic.       Chronic back pain    Increased and persistent pain.  Has been worked up previously.  Refer to Dr Sharlet Salina for further evaluation and treatment.        CVA (cerebral vascular accident) [facility])    Has history of cva.  No reoccurring symptoms.       Diabetes mellitus ([place])    Low carb diet and exercise.  Follow met b and a1c.        Health care maintenance - Primary    Physical today 05/07/15.  Mammogram 10/30/14 - Birads I.  Discussed colonoscopy.  States saw GI.  Did not want to pursue.  Discussed cologuard.       Hypercholesterolemia    Low cholesterol diet and exercise.  Follow lipid panel and liver function tests.  On crestor.        Hypertension    Blood pressure under good control.  Continue same medication regimen.  Follow pressures.  Follow metabolic panel.        Psoriatic arthritis [facility])    Seeing rheumatology.  Intolerant to MTX as outlined.  Follow.       Seizure disorder ([place])    On dilantin.  Check dilantin level.  No seizures.        Severe obesity (BMI >= 40) (HCC)    Discussed diet and exercise.  Follow.  Stress    Doing well.  On zoloft.  Follow.           Einar Pheasant, MD

## 2015-05-07 NOTE — Telephone Encounter (Signed)
Pt requesting a refill. Last OV 10/19/14, last filled 01/01/15. Ok to refill?

## 2015-05-07 NOTE — Telephone Encounter (Signed)
ok'd refill for zoloft #45 with 2 refills.

## 2015-05-07 NOTE — Progress Notes (Signed)
Pre-visit discussion using our clinic review tool. No additional management support is needed unless otherwise documented below in the visit note.  

## 2015-05-14 ENCOUNTER — Encounter: Payer: Self-pay | Admitting: Internal Medicine

## 2015-05-14 DIAGNOSIS — L4050 Arthropathic psoriasis, unspecified: Secondary | ICD-10-CM | POA: Insufficient documentation

## 2015-05-14 NOTE — Assessment & Plan Note (Signed)
Increased and persistent pain.  Has been worked up previously.  Refer to Dr Sharlet Salina for further evaluation and treatment.

## 2015-05-14 NOTE — Assessment & Plan Note (Signed)
Seeing rheumatology.  Intolerant to MTX as outlined.  Follow.

## 2015-05-14 NOTE — Assessment & Plan Note (Signed)
Breathing stable.

## 2015-05-14 NOTE — Assessment & Plan Note (Signed)
On dilantin.  Check dilantin level.  No seizures.

## 2015-05-14 NOTE — Assessment & Plan Note (Signed)
Doing well.  On zoloft.  Follow.

## 2015-05-14 NOTE — Assessment & Plan Note (Signed)
Discussed diet and exercise.  Follow.  

## 2015-05-14 NOTE — Assessment & Plan Note (Signed)
Blood pressure under good control.  Continue same medication regimen.  Follow pressures.  Follow metabolic panel.   

## 2015-05-14 NOTE — Assessment & Plan Note (Signed)
Has history of cva.  No reoccurring symptoms.

## 2015-05-14 NOTE — Assessment & Plan Note (Signed)
Heart rate controlled.  Follow.  Currently asymptomatic.

## 2015-05-14 NOTE — Assessment & Plan Note (Signed)
Low cholesterol diet and exercise.  Follow lipid panel and liver function tests.  On crestor.   

## 2015-05-14 NOTE — Assessment & Plan Note (Signed)
Low carb diet and exercise.  Follow met b and a1c.

## 2015-05-22 ENCOUNTER — Other Ambulatory Visit: Payer: Self-pay | Admitting: Internal Medicine

## 2015-05-22 MED ORDER — ALBUTEROL SULFATE HFA 108 (90 BASE) MCG/ACT IN AERS: INHALATION_SPRAY | RESPIRATORY_TRACT | Status: DC

## 2015-05-22 NOTE — Telephone Encounter (Signed)
Per CY okay to refill x 12

## 2015-07-07 ENCOUNTER — Other Ambulatory Visit: Payer: Self-pay | Admitting: Internal Medicine

## 2015-07-07 DIAGNOSIS — E1140 Type 2 diabetes mellitus with diabetic neuropathy, unspecified: Secondary | ICD-10-CM

## 2015-07-07 DIAGNOSIS — G40909 Epilepsy, unspecified, not intractable, without status epilepticus: Secondary | ICD-10-CM

## 2015-07-07 DIAGNOSIS — E7800 Pure hypercholesterolemia, unspecified: Secondary | ICD-10-CM

## 2015-07-07 DIAGNOSIS — I10 Essential (primary) hypertension: Secondary | ICD-10-CM

## 2015-07-07 NOTE — Progress Notes (Signed)
Orders placed for labs

## 2015-07-22 ENCOUNTER — Other Ambulatory Visit: Payer: Medicare PPO

## 2015-07-25 ENCOUNTER — Other Ambulatory Visit: Payer: Self-pay | Admitting: Internal Medicine

## 2015-08-06 ENCOUNTER — Ambulatory Visit (INDEPENDENT_AMBULATORY_CARE_PROVIDER_SITE_OTHER): Payer: Medicare PPO | Admitting: Nurse Practitioner

## 2015-08-06 ENCOUNTER — Encounter: Payer: Self-pay | Admitting: Nurse Practitioner

## 2015-08-06 VITALS — BP 126/70 | HR 97 | Ht 65.0 in | Wt 239.0 lb

## 2015-08-06 DIAGNOSIS — G40909 Epilepsy, unspecified, not intractable, without status epilepticus: Secondary | ICD-10-CM | POA: Diagnosis not present

## 2015-08-06 MED ORDER — PHENYTOIN SODIUM EXTENDED 100 MG PO CAPS: 400.0000 mg | ORAL_CAPSULE | Freq: Every day | ORAL | 11 refills | Status: DC

## 2015-08-06 MED ORDER — LEVETIRACETAM 750 MG PO TABS: 750.0000 mg | ORAL_TABLET | Freq: Two times a day (BID) | ORAL | 11 refills | Status: DC

## 2015-08-06 NOTE — Progress Notes (Signed)
I have read the note, and I agree with the clinical assessment and plan.  Shabana Armentrout KEITH   

## 2015-08-06 NOTE — Patient Instructions (Addendum)
Pt to continue Keppra at current dose, will refill Continue Dilantin at current dose, will refill Will check Dilantin level Call for seizure activity F/U in 1 year

## 2015-08-06 NOTE — Progress Notes (Signed)
[facility]  PATIENT: Brooke Baker DOB: 01-26-1934   REASON FOR VISIT: Follow-up for seizure disorder HISTORY FROM: Patient and husband    HISTORY OF PRESENT ILLNESS:Brooke Baker is a 81 year old right-handed white female with a history of seizures. The patient has not had any seizures in a number of years,  Last  2004. The patient indicates that since she went on a combination of Dilantin and Keppra, the seizures have essentially disappeared. The patient usually operates a motor vehicle, but she has been unable to drive since she fractured her femur, and she still requires a walker for ambulation. She has not had any further falls. She is on a combination of calcium and vitamin D supplementation currently. She is tolerating the medications fairly well, and she recently had blood work done in March 2017 CBC and CMP  through her primary care physician, but a dilantin level was not included. She returns for an evaluation at this time. She needs refills on her medications.  REVIEW OF SYSTEMS: Full 14 system review of systems performed and notable only for those listed, all others are neg:  Constitutional: neg  Cardiovascular: Leg swelling Ear/Nose/Throat: neg  Skin: neg Eyes: neg Respiratory: Wheezing shortness of breath Gastroitestinal:  Hematology/Lymphatic: neg  Endocrine: neg Musculoskeletal:neg Allergy/Immunology: neg Neurological: neg Psychiatric: neg Sleep : neg   ALLERGIES: Allergies  Allergen Reactions  . Doxycycline Diarrhea and Nausea Only  . Methotrexate Rash    Mouth broke out    HOME MEDICATIONS: Outpatient Medications Prior to Visit  Medication Sig Dispense Refill  . albuterol (PROVENTIL) (2.5 MG/3ML) 0.083% nebulizer solution USE ONE VIAL VIA NEBULIZER EVERY FOUR HOURS AS NEEDED FOR WHEEZING 180 mL 6  . albuterol (VENTOLIN HFA) 108 (90 Base) MCG/ACT inhaler 2 puffs every 4 hours as directed- rescue 18 g prn  . amLODipine (NORVASC) 10  MG tablet TAKE ONE (1) TABLET EACH DAY 30 tablet 7  . aspirin 81 MG tablet Take 81 mg by mouth daily.      . budesonide-formoterol (SYMBICORT) 80-4.5 MCG/ACT inhaler Inhale 2 puffs into the lungs 2 (two) times daily. 1 Inhaler 11  . dabigatran (PRADAXA) 150 MG CAPS Take 150 mg by mouth every 12 (twelve) hours.    [name] EPINEPHrine (EPIPEN) 0.3 mg/0.3 mL DEVI Inject 0.3 mLs (0.3 mg total) into the muscle once. 1 Device prn  . fluticasone (FLONASE) 50 MCG/ACT nasal spray USE TWO SPRAYS IN EACH NOSTRIL EACH DAY AS DIRECTED BY PHYSICIAN 16 g 3  . hydrochlorothiazide (HYDRODIURIL) 25 MG tablet TAKE ONE (1) TABLET EACH DAY 30 tablet 7  . levETIRAcetam (KEPPRA) 750 MG tablet Take 1 tablet (750 mg total) by mouth 2 (two) times daily. 60 tablet 11  . losartan (COZAAR) 100 MG tablet TAKE ONE (1) TABLET BY MOUTH EVERY DAY 30 tablet 11  . metoprolol succinate (TOPROL-XL) 50 MG 24 hr tablet TAKE ONE TABLET BY MOUTH EVERY NIGHT 30 tablet 7  . NONFORMULARY OR COMPOUNDED ITEM Allergy Vaccine 1:10 Given at Home    . Omega-3 Fatty Acids (FISH OIL) 1000 MG CAPS Take 1 capsule by mouth 2 (two) times daily.      . phenytoin (DILANTIN) 100 MG ER capsule Take 4 capsules (400 mg total) by mouth at bedtime. 120 capsule 11  . rosuvastatin (CRESTOR) 20 MG tablet TAKE ONE (1) TABLET EACH DAY 30 tablet 1  . sertraline (ZOLOFT) 50 MG tablet TAKE 1 AND 1/2 TABLETS BY MOUTH ONCE DAILY 45 tablet 1  . fluocinonide (  LIDEX) 0.05 % external solution Use as needed    . triamcinolone cream (KENALOG) 0.1 % Apply topically daily.     No facility-administered medications prior to visit.     PAST MEDICAL HISTORY: Past Medical History:  Diagnosis Date  . Acute bronchitis   . Allergic rhinitis   . Arthritis   . CAD (coronary artery disease)    s/p stent LAD 8/03 and stent placement OM1  . Cancer ([place])    skin  . Chronic back pain   . COPD with asthma ([place])   . CVA (cerebral vascular accident) ([place])   . Diabetes mellitus ([place])     . Hypercholesterolemia   . Hypertension   . Seizure disorder ([place])     PAST SURGICAL HISTORY: Past Surgical History:  Procedure Laterality Date  . BREAST BIOPSY Bilateral    axillas  . CORONARY STENT PLACEMENT  8/03   LAD  . CORONARY STENT PLACEMENT     OM1  2006  . FEMUR FRACTURE SURGERY Right [id number]   Dr. Marry Guan  . LUMBAR SPINE SURGERY     x3  . TOTAL ABDOMINAL HYSTERECTOMY     appendectomy - age 63  . TOTAL HIP ARTHROPLASTY    . TOTAL KNEE ARTHROPLASTY     2005    FAMILY HISTORY: Family History  Problem Relation Age of Onset  . Allergies Brother   . Cancer Brother   . Allergies Sister   . Cancer Sister     Non-hodgkins lymphoma  . Asthma Sister   . Rheum arthritis Sister   . Stroke Mother   . Asthma Brother   . Rheum arthritis Brother   . Cancer Sister     non-hodgkins lymphoma  . Diabetes Brother     SOCIAL HISTORY: Social History   Social History  . Marital status: Married    Spouse name: N/A  . Number of children: 3  . Years of education: hs   Occupational History  . 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 Smoking status: Never Smoker  . Smokeless tobacco: Never Used  . Alcohol use No  . Drug use: No  . Sexual activity: Not on file   Other Topics Concern  . Not on file   Social History Narrative  . No narrative on file     PHYSICAL EXAM  Vitals:   08/06/15 1259  BP: 126/70  Pulse: 97  Weight: 239 lb (108.4 kg)  Height: 5\' 5"  (1.651 m)   Body mass index is 39.77 kg/m.  Generalized: Well developed, morbidly obese female in no acute distress  Head: normocephalic and atraumatic,. Oropharynx benign  Neck: Supple, no carotid bruits  Musculoskeletal: No deformity  Skin 1-2+ edema below the knees bilaterally  Neurological examination   Mentation: Alert oriented to time, place, history taking. Attention span and concentration appropriate. Recent and remote memory intact.  Follows all commands speech and  language fluent.   Cranial nerve II-XIPupils were equal round reactive to light extraocular movements were full, visual field were full on confrontational test. Facial sensation and strength were normal. hearing was intact to finger rubbing bilaterally. Uvula tongue midline. head turning and shoulder shrug were normal and symmetric.Tongue protrusion into cheek strength was normal. Motor: normal bulk and tone, full strength in the BUE, BLE, fine finger movements normal, no pronator drift. No focal weakness Coordination: finger-nose-finger, performed well has problems with heel-to-shin Reflexes: Brachioradialis 2/2, biceps 2/2, triceps 2/2, patellar  2/2, Achilles 2/2, plantar responses were flexor bilaterally. Gait and Station: Rising up from seated position without assistance, wide based stance, walks with a walker no difficulty with turns, tandem gait not attempted, Romberg is negative DIAGNOSTIC DATA (LABS, IMAGING, TESTING) - ASSESSMENT AND PLAN  82 y.o. year old female  has a past medical history of seizure disorder well-controlled on Dilantin and Keppra. Last seizure event 2004  Pt to continue Keppra at current dose, will refill Continue Dilantin at current dose, will refill Will check Dilantin level, reviewed recent CBC and CMP from March 2017 Call for seizure activity F/U in 1 year Dennie Bible, [facility], [facility], [facility] [address], [place], [place] [address] [phone]

## 2015-08-07 ENCOUNTER — Telehealth: Payer: Self-pay | Admitting: *Deleted

## 2015-08-07 LAB — PHENYTOIN LEVEL, TOTAL: PHENYTOIN (DILANTIN), SERUM: 13.9 ug/mL (ref 10.0–20.0)

## 2015-08-07 NOTE — Telephone Encounter (Signed)
Per Daun Peacock, NP, spoke with patient and informed her that her Dilantin level is good; continue with her current dose of the medication. She verbalized understanding, appreciation. [name]

## 2015-08-15 DIAGNOSIS — D692 Other nonthrombocytopenic purpura: Secondary | ICD-10-CM | POA: Diagnosis not present

## 2015-08-15 DIAGNOSIS — L821 Other seborrheic keratosis: Secondary | ICD-10-CM | POA: Diagnosis not present

## 2015-08-15 DIAGNOSIS — L570 Actinic keratosis: Secondary | ICD-10-CM | POA: Diagnosis not present

## 2015-08-15 DIAGNOSIS — X32XXXA Exposure to sunlight, initial encounter: Secondary | ICD-10-CM | POA: Diagnosis not present

## 2015-08-15 DIAGNOSIS — Z85828 Personal history of other malignant neoplasm of skin: Secondary | ICD-10-CM | POA: Diagnosis not present

## 2015-08-15 DIAGNOSIS — H00019 Hordeolum externum unspecified eye, unspecified eyelid: Secondary | ICD-10-CM | POA: Diagnosis not present

## 2015-09-10 ENCOUNTER — Ambulatory Visit: Payer: Medicare PPO | Admitting: Internal Medicine

## 2015-09-13 ENCOUNTER — Encounter: Payer: Self-pay | Admitting: Internal Medicine

## 2015-09-13 ENCOUNTER — Other Ambulatory Visit (INDEPENDENT_AMBULATORY_CARE_PROVIDER_SITE_OTHER): Payer: Medicare PPO

## 2015-09-13 ENCOUNTER — Ambulatory Visit (INDEPENDENT_AMBULATORY_CARE_PROVIDER_SITE_OTHER): Payer: Medicare PPO | Admitting: Internal Medicine

## 2015-09-13 VITALS — BP 112/68 | HR 79 | Temp 97.8°F | Ht 65.0 in | Wt 241.6 lb

## 2015-09-13 DIAGNOSIS — Z658 Other specified problems related to psychosocial circumstances: Secondary | ICD-10-CM

## 2015-09-13 DIAGNOSIS — Z23 Encounter for immunization: Secondary | ICD-10-CM

## 2015-09-13 DIAGNOSIS — L4050 Arthropathic psoriasis, unspecified: Secondary | ICD-10-CM

## 2015-09-13 DIAGNOSIS — E7800 Pure hypercholesterolemia, unspecified: Secondary | ICD-10-CM | POA: Diagnosis not present

## 2015-09-13 DIAGNOSIS — F439 Reaction to severe stress, unspecified: Secondary | ICD-10-CM

## 2015-09-13 DIAGNOSIS — E1140 Type 2 diabetes mellitus with diabetic neuropathy, unspecified: Secondary | ICD-10-CM

## 2015-09-13 DIAGNOSIS — G8929 Other chronic pain: Secondary | ICD-10-CM

## 2015-09-13 DIAGNOSIS — G629 Polyneuropathy, unspecified: Secondary | ICD-10-CM

## 2015-09-13 DIAGNOSIS — M549 Dorsalgia, unspecified: Secondary | ICD-10-CM | POA: Diagnosis not present

## 2015-09-13 DIAGNOSIS — I482 Chronic atrial fibrillation, unspecified: Secondary | ICD-10-CM

## 2015-09-13 DIAGNOSIS — J45998 Other asthma: Secondary | ICD-10-CM

## 2015-09-13 DIAGNOSIS — I10 Essential (primary) hypertension: Secondary | ICD-10-CM | POA: Diagnosis not present

## 2015-09-13 DIAGNOSIS — G40909 Epilepsy, unspecified, not intractable, without status epilepticus: Secondary | ICD-10-CM

## 2015-09-13 DIAGNOSIS — D7589 Other specified diseases of blood and blood-forming organs: Secondary | ICD-10-CM

## 2015-09-13 DIAGNOSIS — I2510 Atherosclerotic heart disease of native coronary artery without angina pectoris: Secondary | ICD-10-CM

## 2015-09-13 LAB — CBC WITH DIFFERENTIAL/PLATELET
Basophils Absolute: 0 10*3/uL (ref 0.0–0.1)
Basophils Relative: 0.2 % (ref 0.0–3.0)
Eosinophils Absolute: 1 10*3/uL — ABNORMAL HIGH (ref 0.0–0.7)
HCT: 35.7 % — ABNORMAL LOW (ref 36.0–46.0)
HEMOGLOBIN: 12.3 g/dL (ref 12.0–15.0)
LYMPHS PCT: 14.6 % (ref 12.0–46.0)
Lymphs Abs: 0.9 10*3/uL (ref 0.7–4.0)
MCHC: 34.4 g/dL (ref 30.0–36.0)
MCV: 100.2 fl — ABNORMAL HIGH (ref 78.0–100.0)
MONO ABS: 0.8 10*3/uL (ref 0.1–1.0)
Monocytes Relative: 12.7 % — ABNORMAL HIGH (ref 3.0–12.0)
Neutro Abs: 3.3 10*3/uL (ref 1.4–7.7)
Neutrophils Relative %: 55.3 % (ref 43.0–77.0)
Platelets: 153 10*3/uL (ref 150.0–400.0)
RBC: 3.57 Mil/uL — AB (ref 3.87–5.11)
RDW: 13.5 % (ref 11.5–15.5)
WBC: 6 10*3/uL (ref 4.0–10.5)

## 2015-09-13 LAB — HEPATIC FUNCTION PANEL
ALBUMIN: 3.4 g/dL — AB (ref 3.5–5.2)
ALT: 15 U/L (ref 0–35)
AST: 24 U/L (ref 0–37)
Alkaline Phosphatase: 100 U/L (ref 39–117)
Bilirubin, Direct: 0.2 mg/dL (ref 0.0–0.3)
Total Bilirubin: 0.6 mg/dL (ref 0.2–1.2)
Total Protein: 7.7 g/dL (ref 6.0–8.3)

## 2015-09-13 LAB — LIPID PANEL
CHOLESTEROL: 117 mg/dL (ref 0–200)
HDL: 45.5 mg/dL (ref >39.00)
LDL Cholesterol: 59 mg/dL (ref 0–99)
NONHDL: 71.49
Total CHOL/HDL Ratio: 3
Triglycerides: 62 mg/dL (ref 0.0–149.0)
VLDL: 12.4 mg/dL (ref 0.0–40.0)

## 2015-09-13 LAB — HEMOGLOBIN A1C: HEMOGLOBIN A1C: 5.5 % (ref 4.6–6.5)

## 2015-09-13 LAB — TSH: TSH: 2.77 u[IU]/mL (ref 0.35–4.50)

## 2015-09-13 NOTE — Progress Notes (Signed)
Pre visit review using our clinic review tool, if applicable. No additional management support is needed unless otherwise documented below in the visit note. 

## 2015-09-13 NOTE — Progress Notes (Signed)
Patient ID: Brooke Baker, female   DOB: [birth year], 81 y.o.   MRN: [id number]   Subjective:    Patient ID: Brooke Baker, female    DOB: August 14, 1934, 81 y.o.   MRN: [id number]  HPI  Patient here for a scheduled follow up.  Sees cardiology.  Last evaluated 05/2015.  On pradaxa for paroxysmal afib.  Felt to be stable.  Recommended f/u in 6 months.   Also sees rheumatology for her psoriatic arthritis.  Sees neurology (Dr Hassell Done) for her seizures.  On dilantin and keppra and controlled.  Just evaluated 08/06/15.  Stable.  Still with increased stress.  Overall she feels she is handling things well.  No chest pain.  No sob.  No acid reflux.  No abdominal pain or cramping.  Bowels stable.      Past Medical History:  Diagnosis Date  . Acute bronchitis   . Allergic rhinitis   . Arthritis   . CAD (coronary artery disease)    s/p stent LAD 8/03 and stent placement OM1  . Cancer ([place])    skin  . Chronic back pain   . COPD with asthma ([place])   . CVA (cerebral vascular accident) ([place])   . Diabetes mellitus ([place])   . Hypercholesterolemia   . Hypertension   . Seizure disorder [facility])    Past Surgical History:  Procedure Laterality Date  . BREAST BIOPSY Bilateral    axillas  . CORONARY STENT PLACEMENT  8/03   LAD  . CORONARY STENT PLACEMENT     OM1  2006  . FEMUR FRACTURE SURGERY Right [id number]   Dr. Marry Guan  . LUMBAR SPINE SURGERY     x3  . TOTAL ABDOMINAL HYSTERECTOMY     appendectomy - age 81  . TOTAL HIP ARTHROPLASTY    . TOTAL KNEE ARTHROPLASTY     2005   Family History  Problem Relation Age of Onset  . Allergies Brother   . Cancer Brother   . Allergies Sister   . Cancer Sister     Non-hodgkins lymphoma  . Asthma Sister   . Rheum arthritis Sister   . Stroke Mother   . Asthma Brother   . Rheum arthritis Brother   . Cancer Sister     non-hodgkins lymphoma  . Diabetes Brother    Social History   Social History  . Marital status: Married    Spouse name: N/A  . Number of  children: 3  . Years of education: hs   Occupational History  . 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 Smoking status: Never Smoker  . Smokeless tobacco: Never Used  . Alcohol use No  . Drug use: No  . Sexual activity: Not Asked   Other Topics Concern  . None   Social History Narrative  . None    Outpatient Encounter Prescriptions as of 09/13/2015  Medication Sig  . albuterol (PROVENTIL) (2.5 MG/3ML) 0.083% nebulizer solution USE ONE VIAL VIA NEBULIZER EVERY FOUR HOURS AS NEEDED FOR WHEEZING  . albuterol (VENTOLIN HFA) 108 (90 Base) MCG/ACT inhaler 2 puffs every 4 hours as directed- rescue  . amLODipine (NORVASC) 10 MG tablet TAKE ONE (1) TABLET EACH DAY  . aspirin 81 MG tablet Take 81 mg by mouth daily.    . budesonide-formoterol (SYMBICORT) 80-4.5 MCG/ACT inhaler Inhale 2 puffs into the lungs 2 (two) times daily.  . dabigatran (PRADAXA) 150 MG CAPS Take 150 mg by mouth every  12 (twelve) hours.  [name] EPINEPHrine (EPIPEN) 0.3 mg/0.3 mL DEVI Inject 0.3 mLs (0.3 mg total) into the muscle once.  . fluticasone (FLONASE) 50 MCG/ACT nasal spray USE TWO SPRAYS IN EACH NOSTRIL EACH DAY AS DIRECTED BY PHYSICIAN  . hydrochlorothiazide (HYDRODIURIL) 25 MG tablet TAKE ONE (1) TABLET EACH DAY  . levETIRAcetam (KEPPRA) 750 MG tablet Take 1 tablet (750 mg total) by mouth 2 (two) times daily.  [name] losartan (COZAAR) 100 MG tablet TAKE ONE (1) TABLET BY MOUTH EVERY DAY  . metoprolol succinate (TOPROL-XL) 50 MG 24 hr tablet TAKE ONE TABLET BY MOUTH EVERY NIGHT  . NONFORMULARY OR COMPOUNDED ITEM Allergy Vaccine 1:10 Given at Home  . Omega-3 Fatty Acids (FISH OIL) 1000 MG CAPS Take 1 capsule by mouth 2 (two) times daily.    . phenytoin (DILANTIN) 100 MG ER capsule Take 4 capsules (400 mg total) by mouth at bedtime.  . rosuvastatin (CRESTOR) 20 MG tablet TAKE ONE (1) TABLET EACH DAY  . sertraline (ZOLOFT) 50 MG tablet TAKE 1 AND 1/2 TABLETS BY MOUTH ONCE DAILY   No  facility-administered encounter medications on file as of 09/13/2015.     Review of Systems  Constitutional: Negative for appetite change and unexpected weight change.  HENT: Negative for congestion and sinus pressure.   Respiratory: Negative for cough, chest tightness and shortness of breath.   Cardiovascular: Negative for chest pain, palpitations and leg swelling.  Gastrointestinal: Negative for abdominal pain, diarrhea, nausea and vomiting.  Genitourinary: Negative for difficulty urinating and dysuria.  Musculoskeletal: Positive for back pain (chronic). Negative for myalgias.  Skin: Negative for color change and rash.  Neurological: Negative for dizziness, light-headedness and headaches.  Psychiatric/Behavioral: Negative for agitation and dysphoric mood.       Objective:    Physical Exam  Constitutional: She appears well-developed and well-nourished. No distress.  HENT:  Nose: Nose normal.  Mouth/Throat: Oropharynx is clear and moist.  Neck: Neck supple. No thyromegaly present.  Cardiovascular: Normal rate and regular rhythm.   Pulmonary/Chest: Breath sounds normal. No respiratory distress. She has no wheezes.  Abdominal: Soft. Bowel sounds are normal. There is no tenderness.  Musculoskeletal: She exhibits no edema or tenderness.  Chronic back pain.    Lymphadenopathy:    She has no cervical adenopathy.  Skin: No rash noted. No erythema.  Psychiatric: She has a normal mood and affect. Her behavior is normal.    BP 112/68   Pulse 79   Temp 97.8 F (36.6 C) (Oral)   Ht 5' 5"  (1.651 m)   Wt 241 lb 9.6 oz (109.6 kg)   LMP 08/17/1965   SpO2 94%   BMI 40.20 kg/m  Wt Readings from Last 3 Encounters:  09/13/15 241 lb 9.6 oz (109.6 kg)  08/06/15 239 lb (108.4 kg)  05/07/15 237 lb 4 oz (107.6 kg)     Lab Results  Component Value Date   WBC 6.0 09/13/2015   HGB 12.3 09/13/2015   HCT 35.7 (L) 09/13/2015   PLT 153.0 09/13/2015   GLUCOSE 113 (H) 10/26/2014   CHOL 117  09/13/2015   TRIG 62.0 09/13/2015   HDL 45.50 09/13/2015   LDLCALC 59 09/13/2015   ALT 15 09/13/2015   AST 24 09/13/2015   NA 141 10/26/2014   K 4.7 10/26/2014   CL 110 10/26/2014   CREATININE 1.07 10/26/2014   BUN 31 (H) 10/26/2014   CO2 23 10/26/2014   TSH 2.77 09/13/2015   INR 1.3 05/12/2012   HGBA1C  5.5 09/13/2015   MICROALBUR 4.3 (H) 10/26/2014    Mm Screening Breast Tomo Bilateral  Result Date: 10/30/2014 CLINICAL DATA:  Screening. EXAM: DIGITAL SCREENING BILATERAL MAMMOGRAM WITH 3D TOMO WITH CAD COMPARISON:  Previous exam(s). ACR Breast Density Category c: The breast tissue is heterogeneously dense, which may obscure small masses. FINDINGS: There are no findings suspicious for malignancy. Images were processed with CAD. IMPRESSION: No mammographic evidence of malignancy. A result letter of this screening mammogram will be mailed directly to the patient. RECOMMENDATION: Screening mammogram in one year. (Code:SM-B-01Y) BI-RADS CATEGORY  1: Negative. Electronically Signed   By: Nolon Nations M.D.   On: 10/30/2014 17:05       Assessment & Plan:   Problem List Items Addressed This Visit    Allergic-infective asthma    Breathing stable.  Sees pulmonary.        Atrial fibrillation ([place]) - Primary    On pradaxa.  Doing well.  Follow.        Chronic back pain   Coronary atherosclerosis    Known disease.  Sees cardiology.  Asymptomatic.  Follow.  Risk factor modification.       Diabetes mellitus ([place])    Low carb diet and exercise.  Follow met b and a1c.        Hypercholesterolemia    On crestor.  Low cholesterol diet and exercise.  Follow lipid panel and liver function tests.        Hypertension    Blood pressure under good control.  Continue same medication regimen.  Follow pressures.  Follow metabolic panel.        Relevant Orders   TSH (Completed)   CBC with Differential/Platelet (Completed)   Macrocytosis   Neuropathy (H[facility])    Some burning and decreased  sensation.  Discussed treatment options.  She desires no further treatment.  Follow.        Psoriatic arthritis ([place])    Followed by rheumatology.        Seizure disorder [facility])    Seeing neurology.  No seizures.  Follow.        Severe obesity (BMI >= 40) (H[facility])    Diet and exercise.        Stress    Increased stress.  Discussed with her today.  Overall handling things relatively well.  Follow.  On zoloft.         Other Visit Diagnoses    Encounter for immunization       Relevant Orders   Flu vaccine HIGH DOSE PF (Completed)       Einar Pheasant, MD

## 2015-09-15 ENCOUNTER — Encounter: Payer: Self-pay | Admitting: Internal Medicine

## 2015-09-15 NOTE — Assessment & Plan Note (Signed)
On crestor.  Low cholesterol diet and exercise.  Follow lipid panel and liver function tests.   

## 2015-09-15 NOTE — Assessment & Plan Note (Signed)
Blood pressure under good control.  Continue same medication regimen.  Follow pressures.  Follow metabolic panel.   

## 2015-09-15 NOTE — Assessment & Plan Note (Signed)
Increased stress.  Discussed with her today.  Overall handling things relatively well.  Follow.  On zoloft.

## 2015-09-15 NOTE — Assessment & Plan Note (Signed)
Low carb diet and exercise.  Follow met b and a1c.   

## 2015-09-15 NOTE — Assessment & Plan Note (Signed)
Followed by rheumatology. 

## 2015-09-15 NOTE — Assessment & Plan Note (Signed)
On pradaxa.  Doing well.  Follow.

## 2015-09-15 NOTE — Assessment & Plan Note (Signed)
Known disease.  Sees cardiology.  Asymptomatic.  Follow.  Risk factor modification.

## 2015-09-15 NOTE — Assessment & Plan Note (Signed)
Some burning and decreased sensation.  Discussed treatment options.  She desires no further treatment.  Follow.

## 2015-09-15 NOTE — Assessment & Plan Note (Signed)
Diet and exercise.   

## 2015-09-15 NOTE — Assessment & Plan Note (Signed)
Breathing stable.  Sees pulmonary.

## 2015-09-15 NOTE — Assessment & Plan Note (Signed)
Seeing neurology.  No seizures.  Follow.  

## 2015-09-16 ENCOUNTER — Telehealth: Payer: Self-pay

## 2015-09-16 ENCOUNTER — Other Ambulatory Visit (INDEPENDENT_AMBULATORY_CARE_PROVIDER_SITE_OTHER): Payer: Medicare PPO

## 2015-09-16 DIAGNOSIS — I10 Essential (primary) hypertension: Secondary | ICD-10-CM

## 2015-09-16 DIAGNOSIS — E1140 Type 2 diabetes mellitus with diabetic neuropathy, unspecified: Secondary | ICD-10-CM | POA: Diagnosis not present

## 2015-09-16 LAB — BASIC METABOLIC PANEL
BUN: 22 mg/dL (ref 6–23)
CALCIUM: 8.8 mg/dL (ref 8.4–10.5)
CO2: 23 meq/L (ref 19–32)
CREATININE: 0.95 mg/dL (ref 0.40–1.20)
Chloride: 110 mEq/L (ref 96–112)
GFR: 60.28 mL/min (ref >60.00)
GLUCOSE: 109 mg/dL — AB (ref 70–99)
Potassium: 5.1 mEq/L (ref 3.5–5.1)
Sodium: 141 mEq/L (ref 135–145)

## 2015-09-16 NOTE — Telephone Encounter (Signed)
-----  Message from Tanya M Roman, RN sent at 09/16/2015  8:26 AM EDT -----   ----- Message ----- From: Charlene [name], MD Sent: 09/15/2015  11:45 PM To: Latoya S Wright, CMA  Notify pt that her cholesterol looks good.  Overall sugar control looks good.  Liver panel wnl.  Her metabolic panel did not get run.  See if she can come back in for met b.  Non fasting lab.  Please schedule over the next 1-2 weeks or at her convenience. 

## 2015-09-17 ENCOUNTER — Telehealth: Payer: Self-pay | Admitting: Internal Medicine

## 2015-09-17 NOTE — Telephone Encounter (Signed)
Spoke with the patient, has an appt on Thursday for BMET, thanks

## 2015-09-17 NOTE — Telephone Encounter (Signed)
Pt called and said office called. She does not know what it is about. Please call pt. She thinks Janett Billow, Dr. Bary Leriche asst. Called her.

## 2015-09-19 ENCOUNTER — Other Ambulatory Visit (INDEPENDENT_AMBULATORY_CARE_PROVIDER_SITE_OTHER): Payer: Medicare PPO

## 2015-09-19 DIAGNOSIS — I10 Essential (primary) hypertension: Secondary | ICD-10-CM | POA: Diagnosis not present

## 2015-09-19 DIAGNOSIS — G40909 Epilepsy, unspecified, not intractable, without status epilepticus: Secondary | ICD-10-CM | POA: Diagnosis not present

## 2015-09-20 LAB — BASIC METABOLIC PANEL
BUN: 16 mg/dL (ref 7–25)
CALCIUM: 8.6 mg/dL (ref 8.6–10.4)
CO2: 21 mmol/L (ref 20–31)
CREATININE: 1.05 mg/dL — AB (ref 0.60–0.93)
Chloride: 109 mmol/L (ref 98–110)
GLUCOSE: 158 mg/dL — AB (ref 65–99)
POTASSIUM: 4.5 mmol/L (ref 3.5–5.3)
SODIUM: 142 mmol/L (ref 135–146)

## 2015-09-20 LAB — PHENYTOIN LEVEL, TOTAL: Phenytoin Lvl: 11.8 ug/mL (ref 10.0–20.0)

## 2015-09-28 ENCOUNTER — Other Ambulatory Visit: Payer: Self-pay | Admitting: Internal Medicine

## 2015-10-10 ENCOUNTER — Other Ambulatory Visit: Payer: Self-pay | Admitting: Internal Medicine

## 2015-11-08 ENCOUNTER — Other Ambulatory Visit: Payer: Self-pay | Admitting: Internal Medicine

## 2015-11-12 DIAGNOSIS — J410 Simple chronic bronchitis: Secondary | ICD-10-CM | POA: Diagnosis not present

## 2015-11-12 DIAGNOSIS — I10 Essential (primary) hypertension: Secondary | ICD-10-CM | POA: Diagnosis not present

## 2015-11-12 DIAGNOSIS — Z9889 Other specified postprocedural states: Secondary | ICD-10-CM | POA: Diagnosis not present

## 2015-11-12 DIAGNOSIS — I2510 Atherosclerotic heart disease of native coronary artery without angina pectoris: Secondary | ICD-10-CM | POA: Diagnosis not present

## 2015-11-12 DIAGNOSIS — R0602 Shortness of breath: Secondary | ICD-10-CM | POA: Diagnosis not present

## 2015-11-12 DIAGNOSIS — E7800 Pure hypercholesterolemia, unspecified: Secondary | ICD-10-CM | POA: Diagnosis not present

## 2015-11-13 ENCOUNTER — Telehealth: Payer: Self-pay | Admitting: Internal Medicine

## 2015-11-13 MED ORDER — ALBUTEROL SULFATE (2.5 MG/3ML) 0.083% IN NEBU: INHALATION_SOLUTION | RESPIRATORY_TRACT | 0 refills | Status: DC

## 2015-11-13 NOTE — Telephone Encounter (Signed)
Spoke with the pt  She is requesting refill on Albuterol nebs  Rx was sent and nothing further needed per pt

## 2015-11-18 ENCOUNTER — Ambulatory Visit (INDEPENDENT_AMBULATORY_CARE_PROVIDER_SITE_OTHER): Payer: Medicare PPO | Admitting: Internal Medicine

## 2015-11-18 ENCOUNTER — Encounter: Payer: Self-pay | Admitting: Internal Medicine

## 2015-11-18 VITALS — BP 130/78 | HR 91 | Ht 66.0 in | Wt 241.0 lb

## 2015-11-18 DIAGNOSIS — I482 Chronic atrial fibrillation, unspecified: Secondary | ICD-10-CM

## 2015-11-18 DIAGNOSIS — J209 Acute bronchitis, unspecified: Secondary | ICD-10-CM

## 2015-11-18 MED ORDER — PREDNISONE 10 MG PO TABS: ORAL_TABLET | ORAL | 0 refills | Status: DC

## 2015-11-18 MED ORDER — LEVALBUTEROL HCL 0.63 MG/3ML IN NEBU
0.6300 mg | INHALATION_SOLUTION | Freq: Once | RESPIRATORY_TRACT | Status: AC
  Administered 2015-11-18: 0.63 mg via RESPIRATORY_TRACT

## 2015-11-18 MED ORDER — METHYLPREDNISOLONE ACETATE 80 MG/ML IJ SUSP
80.0000 mg | Freq: Once | INTRAMUSCULAR | Status: AC
  Administered 2015-11-18: 80 mg via INTRAMUSCULAR

## 2015-11-18 MED ORDER — AZITHROMYCIN 250 MG PO TABS: ORAL_TABLET | ORAL | 0 refills | Status: DC

## 2015-11-18 NOTE — Progress Notes (Signed)
Patient ID: Brooke Baker, female    DOB: 05/28/1934, 81 y.o.   MRN: [id number]  Asthma Her past medical history is significant for asthma.     05/13/13- 78 yoF never smoker followed for Asthma, allergic rhinitis, complicated by hx DM, CAD, CVA/AFib FOLLOWS FOR Pt c/o increased SOB, chest tightness, wheezing, and productive cough x  days. Denies f/c/s, n/v  Describes asthma attack 3 days ago blamed on pollen and whether. She slept up in her recliner and used her rescue inhaler appropriately. Now much improved. Allergy vaccine 1:10 GO without problems. CXR 05/30/13 image reviewed IMPRESSION: Diffuse chronic interstitial change with no acute findings Electronically Signed  By: Skipper Cliche M.D.  On: 05/30/2013 17:10  11/19/14- 81 year old female never smoker followed for asthma, allergic rhinitis, complicated by history DM, CAD, CVA/ AFib Allergy vaccine 1:10 GO Follows for asthma and allergies. Pt c/o cough with yellow mucus, wheeze and SOB since last night. Pt denies f/n/v/d. Pt did use albuterol HFA last night with improvement in symptoms. Husband here Exposed to sick family members. Now acutely ill with increased cough 1 week, worse last night, yellow.  11/18/2015-81 year old female never smoker followed for Asthma, allergic rhinitis, complicated by history DM, CAD, CVA/A. fib Allergy Vaccine 1:10 GO ACUTE VISIT:Pt states she is having asthma flare up-wheezing, cough-productive, and SOB. She and husband report she got wet and chilled 2 weeks ago with onset of wheeze and cough, thick yellow sputum. No sore throat or fever. Using nebulizer 3 times daily, Symbicort twice, rescue inhaler 3 or 4 times daily.  Review of Systems-see HPI Constitutional:   No-   weight loss, night sweats, fevers, chills, + fatigue, lassitude. HEENT:   No-  headaches, difficulty swallowing, tooth/dental problems, sore throat,       No-  sneezing, itching, ear ache, nasal congestion, CV:  No-   chest  pain,  +orthopnea, PND, swelling in lower extremities, anasarca,  dizziness, palpitations Resp: +shortness of breath with exertion or at rest.             +  productive cough,  + non-productive cough,  No- coughing up of blood.            + change in color of mucus.  + wheezing.   Skin: No-   rash or lesions. GI:  No-   heartburn, indigestion, abdominal pain, nausea, vomiting,  GU: MS:  No-   joint pain or swelling.  . Neuro-     nothing unusual Psych:  No- change in mood or affect. No depression or anxiety.  No memory loss.   Objective:   Physical Exam General- Alert, Oriented, Affect-appropriate, Distress- none acute,   +obese Skin- rash-none, lesions- none, excoriation- none Lymphadenopathy- none Head- atraumatic            Eyes- Gross vision intact, PERRLA, conjunctivae clear secretions. Bilateral shiners            Ears- Hearing, canals-normal            Nose- Sniffing +, no-Septal dev, mucus, polyps, erosion, perforation             Throat- Mallampati II , mucosa clear , drainage- none, tonsils- atrophic Neck- flexible , trachea midline, no stridor , thyroid nl, carotid no bruit Chest - symmetrical excursion , unlabored           Heart/CV-+ IRR , no murmur , no gallop  , no rub, nl s1 s2                           -  JVD- none , edema- 1+ , stasis changes- none, varices- none           Lung-     breath sounds + coarse, wheeze-none            Chest wall-  Abd-  Br/ Gen/ Rectal- Not done, not indicated Extrem- cyanosis- none, clubbing, none, atrophy- none, strength- nl, + heavy legs with edema,           Neuro- grossly intact to observation

## 2015-11-18 NOTE — Assessment & Plan Note (Signed)
Acute exacerbation of asthmatic bronchitis. She doesn't feel that she has a cold, but her description of getting wet and chilled and now having a productive cough of yellow sputum does sound like infection. Significant wheeze component. Plan-Z-Pak, prednisone taper, Depo-Medrol, nebulizer treatments Xopenex

## 2015-11-18 NOTE — Assessment & Plan Note (Signed)
Rhythm is somewhat irregular suggesting controlled ventricular response rate

## 2015-11-18 NOTE — Patient Instructions (Signed)
Script sent for Z pak and for prednisone taper  Neb xop 0.63  Depo 80  Please call as needed

## 2015-11-20 ENCOUNTER — Other Ambulatory Visit: Payer: Self-pay | Admitting: Internal Medicine

## 2015-11-21 ENCOUNTER — Ambulatory Visit: Payer: Medicare PPO | Admitting: Internal Medicine

## 2015-11-25 ENCOUNTER — Other Ambulatory Visit: Payer: Self-pay

## 2015-11-25 ENCOUNTER — Telehealth: Payer: Self-pay | Admitting: Internal Medicine

## 2015-11-25 MED ORDER — PREDNISONE 10 MG PO TABS: ORAL_TABLET | ORAL | 0 refills | Status: DC

## 2015-11-25 NOTE — Telephone Encounter (Signed)
Ok to refill prednisone 

## 2015-11-25 NOTE — Telephone Encounter (Signed)
Called and spoke with pt and she stated that she is feeling a little better but thinks that she needs another round of the prednisone.  She stated that she has to do this about every single time.  She is still coughing and wheezing and has to still sleep in the recliner.  Pt is requesting that prednisone being sent to the pharmacy.  CY please advise. Thanks  Last ov--11/18/15 Next ov--11/19/16  Allergies  Allergen Reactions  . Doxycycline Diarrhea and Nausea Only  . Methotrexate Rash    Mouth broke out

## 2015-11-25 NOTE — Telephone Encounter (Signed)
Per CY it is ok to refill. Pt. Was informed and rx was sent to pharmacy nothing further Is needed.

## 2016-01-13 ENCOUNTER — Other Ambulatory Visit: Payer: Self-pay | Admitting: Internal Medicine

## 2016-01-13 DIAGNOSIS — Z1231 Encounter for screening mammogram for malignant neoplasm of breast: Secondary | ICD-10-CM

## 2016-01-21 ENCOUNTER — Telehealth: Payer: Self-pay | Admitting: Internal Medicine

## 2016-01-21 MED ORDER — PREDNISONE 20 MG PO TABS: 20.0000 mg | ORAL_TABLET | Freq: Every day | ORAL | 0 refills | Status: DC

## 2016-01-21 MED ORDER — AZITHROMYCIN 250 MG PO TABS: ORAL_TABLET | ORAL | 0 refills | Status: AC

## 2016-01-21 NOTE — Telephone Encounter (Signed)
Spoke with pt. States that she is having a flare up. Reports cough, wheezing and chest congestion. Cough is producing yellow mucus. Denies chest tightness, SOB or fever. Has been taking Mucinex OTC with no relief. Symptoms started about 1 week ago. Would like to have prednisone and an antibiotic sent in.  CY - please advise. Thanks.  Allergies  Allergen Reactions  . Doxycycline Diarrhea and Nausea Only  . Methotrexate Rash    Mouth broke out   Current Outpatient Prescriptions on File Prior to Visit  Medication Sig Dispense Refill  . albuterol (PROVENTIL) (2.5 MG/3ML) 0.083% nebulizer solution USE ONE VIAL VIA NEBULIZER EVERY FOUR HOURS AS NEEDED FOR WHEEZING 180 mL 0  . albuterol (VENTOLIN HFA) 108 (90 Base) MCG/ACT inhaler 2 puffs every 4 hours as directed- rescue 18 g prn  . amLODipine (NORVASC) 10 MG tablet TAKE ONE (1) TABLET EACH DAY 30 tablet 2  . aspirin 81 MG tablet Take 81 mg by mouth daily.      [name] azithromycin (ZITHROMAX) 250 MG tablet 2 today then one daily 6 tablet 0  . budesonide-formoterol (SYMBICORT) 80-4.5 MCG/ACT inhaler Inhale 2 puffs into the lungs 2 (two) times daily. 1 Inhaler 11  . dabigatran (PRADAXA) 150 MG CAPS Take 150 mg by mouth every 12 (twelve) hours.    [name] EPINEPHrine (EPIPEN) 0.3 mg/0.3 mL DEVI Inject 0.3 mLs (0.3 mg total) into the muscle once. 1 Device prn  . fluticasone (FLONASE) 50 MCG/ACT nasal spray USE TWO SPRAYS IN EACH NOSTRIL EACH DAY AS DIRECTED BY PHYSICIAN 16 g 3  . hydrochlorothiazide (HYDRODIURIL) 25 MG tablet TAKE ONE (1) TABLET EACH DAY 30 tablet 2  . levETIRAcetam (KEPPRA) 750 MG tablet Take 1 tablet (750 mg total) by mouth 2 (two) times daily. 60 tablet 11  . losartan (COZAAR) 100 MG tablet TAKE ONE (1) TABLET BY MOUTH EVERY DAY 30 tablet 11  . metoprolol succinate (TOPROL-XL) 50 MG 24 hr tablet TAKE ONE TABLET BY MOUTH EVERY NIGHT 30 tablet 2  . NONFORMULARY OR COMPOUNDED ITEM Allergy Vaccine 1:10 Given at Home    . Omega-3 Fatty  Acids (FISH OIL) 1000 MG CAPS Take 1 capsule by mouth 2 (two) times daily.      . phenytoin (DILANTIN) 100 MG ER capsule Take 4 capsules (400 mg total) by mouth at bedtime. 120 capsule 11  . predniSONE (DELTASONE) 10 MG tablet 4 X 2 DAYS, 3 X 2 DAYS, 2 X 2 DAYS, 1 X 2 DAYS 20 tablet 0  . rosuvastatin (CRESTOR) 20 MG tablet TAKE ONE (1) TABLET EACH DAY 30 tablet 3  . sertraline (ZOLOFT) 50 MG tablet TAKE 1 AND ONE-HALF TABLETS ONCE DAILY 45 tablet 3   No current facility-administered medications on file prior to visit.

## 2016-01-21 NOTE — Telephone Encounter (Signed)
Offer prednisone 20 mg, # 5, 1 daily           Zpak  # 6, 2 today then one daily

## 2016-01-21 NOTE — Telephone Encounter (Signed)
Pt aware of CY's recommendations & voiced her understanding. Rx sent to preferred pharmacy. Nothing further needed.  

## 2016-01-28 ENCOUNTER — Other Ambulatory Visit: Payer: Self-pay | Admitting: Internal Medicine

## 2016-01-28 ENCOUNTER — Telehealth: Payer: Self-pay | Admitting: Internal Medicine

## 2016-01-28 NOTE — Telephone Encounter (Signed)
Pt scheduled for acute visit with TP on 01/29/16 @ 11:45am. Pt voiced her understanding and had no further questions. nothing further needed.

## 2016-01-29 ENCOUNTER — Ambulatory Visit (INDEPENDENT_AMBULATORY_CARE_PROVIDER_SITE_OTHER)
Admission: RE | Admit: 2016-01-29 | Discharge: 2016-01-29 | Disposition: A | Discharge status: Home / Self Care | Payer: Medicare PPO | Source: Ambulatory Visit | Attending: Adult Health | Admitting: Adult Health

## 2016-01-29 ENCOUNTER — Ambulatory Visit (INDEPENDENT_AMBULATORY_CARE_PROVIDER_SITE_OTHER): Payer: Medicare PPO | Admitting: Adult Health

## 2016-01-29 ENCOUNTER — Encounter: Payer: Self-pay | Admitting: Adult Health

## 2016-01-29 DIAGNOSIS — J208 Acute bronchitis due to other specified organisms: Secondary | ICD-10-CM

## 2016-01-29 MED ORDER — PREDNISONE 10 MG PO TABS: ORAL_TABLET | ORAL | 0 refills | Status: DC

## 2016-01-29 MED ORDER — AMOXICILLIN-POT CLAVULANATE 875-125 MG PO TABS: 1.0000 | ORAL_TABLET | Freq: Two times a day (BID) | ORAL | 0 refills | Status: AC

## 2016-01-29 NOTE — Addendum Note (Signed)
Addended by: Mathis Bud on: 01/29/2016 12:42 PM   Modules accepted: Orders

## 2016-01-29 NOTE — Assessment & Plan Note (Signed)
Flare with slow to resolve bronchitis  Check xr   Plan  Patient Instructions  Begin Augmentin 875mg  Twice daily  For 1 week .  Begin Prednisone taper over next week.  Mucinex DM Twice daily  As needed  Cough/congestion .  Chest xray today .  follow up Dr. Annamaria Boots  In 6-8 weeks and As needed   Please contact office for sooner follow up if symptoms do not improve or worsen or seek emergency care

## 2016-01-29 NOTE — Patient Instructions (Signed)
Begin Augmentin 875mg  Twice daily  For 1 week .  Begin Prednisone taper over next week.  Mucinex DM Twice daily  As needed  Cough/congestion .  Chest xray today .  follow up Dr. Annamaria Boots  In 6-8 weeks and As needed   Please contact office for sooner follow up if symptoms do not improve or worsen or seek emergency care

## 2016-01-29 NOTE — Progress Notes (Signed)
@Patient  ID: Brooke Baker, female    DOB: Jun 29, 1934, 82 y.o.   MRN: [id number]  Chief Complaint  Patient presents with  . Acute Visit    cough [place]     Referring provider: Einar Pheasant, MD  HPI:82 year old female never smoker followed for asthma and allergic rhinitis  has a history of A Fib on chronic anticoagulation Seizure disorder   01/29/2016 Acute OV  Presents for an acute office visit. Patient complains of 4 weeks of persistent cough, congestion, intermittent wheezing and shortness of breath. She was called in zpack and 5 days of prednisone last that helped some but still has cough /congestion .  Appetite is good with no n/v.d  Cough is getting worse with thick yellow mucus . Taking mucinex without much help.   She remains on Symbicort twice daily.  Allergies  Allergen Reactions  . Doxycycline Diarrhea and Nausea Only  . Methotrexate Rash    Mouth broke out    Immunization History  Administered Date(s) Administered  . Influenza Split 11/06/2010, 11/02/2011, 10/17/2012, 10/12/2013  . Influenza Whole 10/28/2004, 10/05/2008  . Influenza, High Dose Seasonal PF 09/13/2015  . Influenza-Unspecified 10/04/2014  . Pneumococcal Conjugate-13 11/19/2012  . Pneumococcal Polysaccharide-23 01/05/2005, 10/04/2014  . Zoster 12/22/2012    Past Medical History:  Diagnosis Date  . Acute bronchitis   . Allergic rhinitis   . Arthritis   . CAD (coronary artery disease)    s/p stent LAD 8/03 and stent placement OM1  . Cancer ([place])    skin  . Chronic back pain   . COPD with asthma ([place])   . CVA (cerebral vascular accident) ([place])   . Diabetes mellitus ([place])   . Hypercholesterolemia   . Hypertension   . Seizure disorder ([place])     Tobacco History: History  Smoking Status  . Never Smoker  Smokeless Tobacco  . Never Used   Counseling given: Not Answered   Outpatient Encounter Prescriptions as of 01/29/2016  Medication Sig  . albuterol (PROVENTIL) (2.5  MG/3ML) 0.083% nebulizer solution USE ONE VIAL VIA NEBULZIER EVERY FOUR HOURS AS NEEDED FOR WHEEZING  . albuterol (VENTOLIN HFA) 108 (90 Base) MCG/ACT inhaler 2 puffs every 4 hours as directed- rescue  . amLODipine (NORVASC) 10 MG tablet TAKE ONE (1) TABLET EACH DAY  . aspirin 81 MG tablet Take 81 mg by mouth daily.    . budesonide-formoterol (SYMBICORT) 80-4.5 MCG/ACT inhaler Inhale 2 puffs into the lungs 2 (two) times daily.  . dabigatran (PRADAXA) 150 MG CAPS Take 150 mg by mouth every 12 (twelve) hours.  [name] EPINEPHrine (EPIPEN) 0.3 mg/0.3 mL DEVI Inject 0.3 mLs (0.3 mg total) into the muscle once.  . fluticasone (FLONASE) 50 MCG/ACT nasal spray USE TWO SPRAYS IN EACH NOSTRIL EACH DAY AS DIRECTED BY PHYSICIAN  . hydrochlorothiazide (HYDRODIURIL) 25 MG tablet TAKE ONE (1) TABLET EACH DAY  . levETIRAcetam (KEPPRA) 750 MG tablet Take 1 tablet (750 mg total) by mouth 2 (two) times daily.  [name] losartan (COZAAR) 100 MG tablet TAKE ONE (1) TABLET BY MOUTH EVERY DAY  . metoprolol succinate (TOPROL-XL) 50 MG 24 hr tablet TAKE ONE TABLET BY MOUTH EVERY NIGHT  . NONFORMULARY OR COMPOUNDED ITEM Allergy Vaccine 1:10 Given at Home  . Omega-3 Fatty Acids (FISH OIL) 1000 MG CAPS Take 1 capsule by mouth 2 (two) times daily.    . phenytoin (DILANTIN) 100 MG ER capsule Take 4 capsules (400 mg total) by mouth at bedtime.  . rosuvastatin (CRESTOR) 20 MG  tablet TAKE ONE (1) TABLET EACH DAY  . sertraline (ZOLOFT) 50 MG tablet TAKE 1 AND ONE-HALF TABLETS ONCE DAILY  . [DISCONTINUED] azithromycin (ZITHROMAX) 250 MG tablet 2 today then one daily (Patient not taking: Reported on 01/29/2016)  . [DISCONTINUED] predniSONE (DELTASONE) 10 MG tablet 4 X 2 DAYS, 3 X 2 DAYS, 2 X 2 DAYS, 1 X 2 DAYS (Patient not taking: Reported on 01/29/2016)  . [DISCONTINUED] predniSONE (DELTASONE) 20 MG tablet Take 1 tablet (20 mg total) by mouth daily with breakfast. (Patient not taking: Reported on 01/29/2016)   No facility-administered  encounter medications on file as of 01/29/2016.      Review of Systems  Constitutional:   No  weight loss, night sweats,  Fevers, chills,  +fatigue, or  lassitude.  HEENT:   No headaches,  Difficulty swallowing,  Tooth/dental problems, or  Sore throat,                No sneezing, itching, ear ache +, nasal congestion, post nasal drip,   CV:  No chest pain,  Orthopnea, PND, swelling in lower extremities, anasarca, dizziness, palpitations, syncope.   GI  No heartburn, indigestion, abdominal pain, nausea, vomiting, diarrhea, change in bowel habits, loss of appetite, bloody stools.   Resp:    No chest wall deformity  Skin: no rash or lesions.  GU: no dysuria, change in color of urine, no urgency or frequency.  No flank pain, no hematuria   MS:  No joint pain or swelling.  No decreased range of motion.  No back pain.    Physical Exam  BP 140/76 (BP Location: Left Arm)   Pulse (!) 105   Temp 98.2 F (36.8 C) (Oral)   Ht 5\' 6"  (1.676 m)   Wt 239 lb (108.4 kg)   LMP 08/17/1965   SpO2 100%   BMI 38.58 kg/m   GEN: A/Ox3; pleasant , NAD, elderly , obese in wc    HEENT:  [place]/AT,  EACs-clear, TMs-wnl, NOSE-clear, THROAT-clear, no lesions, no postnasal drip or exudate noted.   NECK:  Supple w/ fair ROM; no JVD; normal carotid impulses w/o bruits; no thyromegaly or nodules palpated; no lymphadenopathy.    RESP  Coarse rhonchi bilaterally , . no accessory muscle use, no dullness to percussion  CARD:  RRR, no m/r/g, tr peripheral edema, pulses intact, no cyanosis or clubbing.  GI:   Soft & nt; nml bowel sounds; no organomegaly or masses detected.   Musco: Warm bil, no deformities or joint swelling noted.   Neuro: alert, no focal deficits noted.    Skin: Warm, healing ecchymotic areas on forearms.   Psych:  No change in mood or affect. No depression or anxiety.  No memory loss.  Lab Results:  BNP No results found for: BNP  ProBNP    Component Value Date/Time   PROBNP  152.0 (H) 05/30/2013 1651    Imaging: No results found.   Assessment & Plan:   No problem-specific Assessment & Plan notes found for this encounter.     Rexene Edison, NP 01/29/2016

## 2016-01-31 ENCOUNTER — Telehealth: Payer: Self-pay | Admitting: Adult Health

## 2016-01-31 DIAGNOSIS — R059 Cough, unspecified: Secondary | ICD-10-CM

## 2016-01-31 DIAGNOSIS — R05 Cough: Secondary | ICD-10-CM

## 2016-01-31 NOTE — Telephone Encounter (Signed)
Notes Recorded by Melvenia Needles, NP on 01/30/2016 at 9:11 AM EST CXR shows increased congestion /Bronchitis changes  Finish ABX and set up follow up in 3-4 weeks with cxr  Please contact office for sooner follow up if symptoms do not improve or worsen or seek emergency care   Pt aware of results and voiced her understanding. Apt has been made for 02/21/16 with TP with a CXR prior. Nothing further needed.

## 2016-02-11 ENCOUNTER — Ambulatory Visit: Payer: Medicare PPO

## 2016-02-21 ENCOUNTER — Ambulatory Visit: Payer: Medicare PPO | Admitting: Adult Health

## 2016-02-25 ENCOUNTER — Ambulatory Visit (INDEPENDENT_AMBULATORY_CARE_PROVIDER_SITE_OTHER): Payer: Medicare PPO

## 2016-02-25 VITALS — BP 120/76 | HR 76 | Temp 97.7°F | Resp 16 | Ht 66.0 in | Wt 246.6 lb

## 2016-02-25 DIAGNOSIS — E2839 Other primary ovarian failure: Secondary | ICD-10-CM | POA: Diagnosis not present

## 2016-02-25 DIAGNOSIS — Z0000 Encounter for general adult medical examination without abnormal findings: Secondary | ICD-10-CM | POA: Diagnosis not present

## 2016-02-25 NOTE — Progress Notes (Signed)
Subjective:   Brooke Baker is a 82 y.o. female who presents for an Initial Medicare Annual Wellness Visit.  Review of Systems    No ROS.  Medicare Wellness Visit.  Cardiac Risk Factors include: advanced age (>4men, >15 women);hypertension;obesity (BMI >30kg/m2)     Objective:    Today's Vitals   02/25/16 1340  BP: 120/76  Pulse: 76  Resp: 16  Temp: 97.7 F (36.5 C)  TempSrc: Oral  SpO2: 95%  Weight: 246 lb 9.6 oz (111.9 kg)  Height: 5\' 6"  (1.676 m)   Body mass index is 39.8 kg/m.   Current Medications (verified) Outpatient Encounter Prescriptions as of 02/25/2016  Medication Sig  . albuterol (PROVENTIL) (2.5 MG/3ML) 0.083% nebulizer solution USE ONE VIAL VIA NEBULZIER EVERY FOUR HOURS AS NEEDED FOR WHEEZING  . albuterol (VENTOLIN HFA) 108 (90 Base) MCG/ACT inhaler 2 puffs every 4 hours as directed- rescue  . amLODipine (NORVASC) 10 MG tablet TAKE ONE (1) TABLET EACH DAY  . aspirin 81 MG tablet Take 81 mg by mouth daily.    . budesonide-formoterol (SYMBICORT) 80-4.5 MCG/ACT inhaler Inhale 2 puffs into the lungs 2 (two) times daily.  . dabigatran (PRADAXA) 150 MG CAPS Take 150 mg by mouth every 12 (twelve) hours.  [name] EPINEPHrine (EPIPEN) 0.3 mg/0.3 mL DEVI Inject 0.3 mLs (0.3 mg total) into the muscle once.  . fluticasone (FLONASE) 50 MCG/ACT nasal spray USE TWO SPRAYS IN EACH NOSTRIL EACH DAY AS DIRECTED BY PHYSICIAN  . hydrochlorothiazide (HYDRODIURIL) 25 MG tablet TAKE ONE (1) TABLET EACH DAY  . levETIRAcetam (KEPPRA) 750 MG tablet Take 1 tablet (750 mg total) by mouth 2 (two) times daily.  [name] losartan (COZAAR) 100 MG tablet TAKE ONE (1) TABLET BY MOUTH EVERY DAY  . metoprolol succinate (TOPROL-XL) 50 MG 24 hr tablet TAKE ONE TABLET BY MOUTH EVERY NIGHT  . NONFORMULARY OR COMPOUNDED ITEM Allergy Vaccine 1:10 Given at Home  . Omega-3 Fatty Acids (FISH OIL) 1000 MG CAPS Take 1 capsule by mouth 2 (two) times daily.    . phenytoin (DILANTIN) 100 MG ER capsule Take 4  capsules (400 mg total) by mouth at bedtime.  . rosuvastatin (CRESTOR) 20 MG tablet TAKE ONE (1) TABLET EACH DAY  . sertraline (ZOLOFT) 50 MG tablet TAKE 1 AND ONE-HALF TABLETS ONCE DAILY  . [DISCONTINUED] predniSONE (DELTASONE) 10 MG tablet 4 tabs for 2 days, then 3 tabs for 2 days, 2 tabs for 2 days, then 1 tab for 2 days, then stop (Patient not taking: Reported on 02/25/2016)   No facility-administered encounter medications on file as of 02/25/2016.     Allergies (verified) Doxycycline and Methotrexate   History: Past Medical History:  Diagnosis Date  . Acute bronchitis   . Allergic rhinitis   . Arthritis   . CAD (coronary artery disease)    s/p stent LAD 8/03 and stent placement OM1  . Cancer ([place])    skin  . Chronic back pain   . COPD with asthma ([place])   . CVA (cerebral vascular accident) ([place])   . Diabetes mellitus ([place])   . Hypercholesterolemia   . Hypertension   . Seizure disorder [facility])    Past Surgical History:  Procedure Laterality Date  . BREAST BIOPSY Bilateral    axillas  . CORONARY STENT PLACEMENT  8/03   LAD  . CORONARY STENT PLACEMENT     OM1  2006  . FEMUR FRACTURE SURGERY Right [id number]   Dr. Marry Guan  . LUMBAR SPINE SURGERY  x3  . TOTAL ABDOMINAL HYSTERECTOMY     appendectomy - age 71  . TOTAL HIP ARTHROPLASTY    . TOTAL KNEE ARTHROPLASTY     2005   Family History  Problem Relation Age of Onset  . Allergies Brother   . Cancer Brother   . Allergies Sister   . Cancer Sister     Non-hodgkins lymphoma  . Asthma Sister   . Rheum arthritis Sister   . Stroke Mother   . Asthma Brother   . Rheum arthritis Brother   . Cancer Sister     non-hodgkins lymphoma  . Diabetes Brother    Social History   Occupational History  . 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 Smoking status: Never Smoker  . Smokeless tobacco: Never Used  . Alcohol use No  . Drug use: No  . Sexual activity: No    Tobacco Counseling Counseling  given: Not Answered   Activities of Daily Living In your present state of health, do you have any difficulty performing the following activities: 02/25/2016  Hearing? N  Vision? N  Difficulty concentrating or making decisions? Y  Walking or climbing stairs? Y  Dressing or bathing? N  Doing errands, shopping? Y  Preparing Food and eating ? N  Using the Toilet? N  In the past six months, have you accidently leaked urine? N  Do you have problems with loss of bowel control? N  Managing your Medications? N  Managing your Finances? Y  Housekeeping or managing your Housekeeping? Y  Some recent data might be hidden    Immunizations and Health Maintenance Immunization History  Administered Date(s) Administered  . Influenza Split 11/06/2010, 11/02/2011, 10/17/2012, 10/12/2013  . Influenza Whole 10/28/2004, 10/05/2008  . Influenza, High Dose Seasonal PF 09/13/2015  . Influenza-Unspecified 10/04/2014  . Pneumococcal Conjugate-13 11/19/2012  . Pneumococcal Polysaccharide-23 01/05/2005, 10/04/2014  . Zoster 12/22/2012   Health Maintenance Due  Topic Date Due  . FOOT EXAM  06/26/1944  . OPHTHALMOLOGY EXAM  06/26/1944  . TETANUS/TDAP  06/26/1955  . DEXA SCAN  06/25/2001  . MAMMOGRAM  10/30/2015    Patient Care Team: Einar Pheasant, MD as PCP - General  Indicate any recent Medical Services you may have received from other than [facility] providers in the past year (date may be approximate).     Assessment:   This is a routine wellness examination for Brooke Baker. The goal of the wellness visit is to assist the patient how to close the gaps in care and create a preventative care plan for the patient.   Osteoporosis risk reviewed. DEXA Scan ordered; follow as directed.  Educational material provided.l  Medications reviewed; taking without issues or barriers.  Safety issues reviewed; smoke detectors in the home. No firearms in the home. Wears seatbelts when driving or riding with others.  No violence in the home.  No identified risk were noted; The patient was oriented x 3; appropriate in dress and manner and no objective failures at ADL's or IADL's.   BMI; discussed the importance of a healthy diet, water intake and exercise. Educational material provided.  HTN; followed by PCP.  TDAP vaccine deferred at this time for follow up with insurance.  Patient Concerns: None at this time. Follow up with PCP as needed.  Hearing/Vision screen Hearing Screening Comments: Patient passes the whisper test Vision Screening Comments: Followed [facility] Last OV 2017 Visual acuity not assessed per preference since  she has regular follow up with her ophthalmologist  Dietary issues and exercise activities discussed: Current Exercise Habits: The patient does not participate in regular exercise at present  Goals    . Increase physical activity          Chair exercises as demonstrated       Depression Screen PHQ 2/9 Scores 02/25/2016 09/13/2015 05/07/2015 11/29/2013 03/27/2012  PHQ - 2 Score 0 0 0 4 0    Fall Risk Fall Risk  02/25/2016 09/13/2015 05/07/2015 11/29/2013 03/27/2012  Falls in the past year? Yes No No Yes No  Number falls in past yr: 2 or more - - 1 -  Injury with Fall? Yes - - No -  Risk Factor Category  High Fall Risk - - - -  Risk for fall due to : History of fall(s) - - - -  Follow up Falls prevention discussed;Education provided - - - -    Cognitive Function: MMSE - Mini Mental State Exam 02/25/2016  Orientation to time 5  Orientation to Place 5  Registration 3  Attention/ Calculation 5  Recall 3  Language- name 2 objects 2  Language- repeat 1  Language- follow 3 step command 3  Language- read & follow direction 1  Write a sentence 1  Copy design 1  Total score 30        Screening Tests Health Maintenance  Topic Date Due  . FOOT EXAM  06/26/1944  . OPHTHALMOLOGY EXAM  06[birth year]26/1946  . TETANUS/TDAP  06/26/1955  . DEXA SCAN  06/25/2001  .  MAMMOGRAM  10/30/2015  . HEMOGLOBIN A1C  03/12/2016  . INFLUENZA VACCINE  Completed  . PNA vac Low Risk Adult  Completed      Plan:  End of life planning; Advance aging; Advanced directives discussed. No HCPOA/Living Will.   Additional information declined at this time.  Medicare Attestation I have personally reviewed: The patient's medical and social history Their use of alcohol, tobacco or illicit drugs Their current medications and supplements The patient's functional ability including ADLs,fall risks, home safety risks, cognitive, and hearing and visual impairment Diet and physical activities Evidence for depression   The patient's weight, height, BMI, and visual acuity have been recorded in the chart.  I have made referrals and provided education to the patient based on review of the above and I have provided the patient with a written personalized care plan for preventive services.    During the course of the visit, Electra was educated and counseled about the following appropriate screening and preventive services:   Vaccines to include Pneumoccal, Influenza, Hepatitis B, Td, Zostavax, HCV  Electrocardiogram  Cardiovascular disease screening  Colorectal cancer screening  Bone density screening  Diabetes screening  Glaucoma screening  Mammography/PAP  Nutrition counseling  Smoking cessation counseling  Patient Instructions (the written plan) were given to the patient.    Varney Biles, LPN   [id number]    Reviewed above information.  Agree with plan.  Dr Nicki Reaper

## 2016-02-25 NOTE — Patient Instructions (Addendum)
Brooke Baker , Thank you for taking time to come for your Medicare Wellness Visit. I appreciate your ongoing commitment to your health goals. Please review the following plan we discussed and let me know if I can assist you in the future.   These are the goals we discussed: Goals    . Increase physical activity          Chair exercises as demonstrated        This is a list of the screening recommended for you and due dates:  Health Maintenance  Topic Date Due  . Complete foot exam   06/26/1946  . Eye exam for diabetics  06/26/1946  . Tetanus Vaccine  06/26/1955  . DEXA scan (bone density measurement)  06/25/2001  . Mammogram  10/30/2015  . Hemoglobin A1C  03/12/2016  . Flu Shot  Completed  . Pneumonia vaccines  Completed   Bone Densitometry Introduction Bone densitometry is an imaging test that uses a special X-ray to measure the amount of calcium and other minerals in your bones (bone density). This test is also known as a bone mineral density test or dual-energy X-ray absorptiometry (DXA). The test can measure bone density at your hip and your spine. It is similar to having a regular X-ray. You may have this test to:  Diagnose a condition that causes weak or thin bones (osteoporosis).  Predict your risk of a broken bone (fracture).  Determine how well osteoporosis treatment is working. Tell a health care provider about:  Any allergies you have.  All medicines you are taking, including vitamins, herbs, eye drops, creams, and over-the-counter medicines.  Any problems you or family members have had with anesthetic medicines.  Any blood disorders you have.  Any surgeries you have had.  Any medical conditions you have.  Possibility of pregnancy.  Any other medical test you had within the previous 14 days that used contrast material. What are the risks? Generally, this is a safe procedure. However, problems can occur and may include the following:  This test exposes  you to a very small amount of radiation.  The risks of radiation exposure may be greater to unborn children. What happens before the procedure?  Do not take any calcium supplements for 24 hours before having the test. You can otherwise eat and drink what you usually do.  Take off all metal jewelry, eyeglasses, dental appliances, and any other metal objects. What happens during the procedure?  You may lie on an exam table. There will be an X-ray generator below you and an imaging device above you.  Other devices, such as boxes or braces, may be used to position your body properly for the scan.  You will need to lie still while the machine slowly scans your body.  The images will show up on a computer monitor. What happens after the procedure? You may need more testing at a later time. This information is not intended to replace advice given to you by your health care provider. Make sure you discuss any questions you have with your health care provider. Document Released: 01/14/2004 Document Revised: 05/30/2015 Document Reviewed: 06/01/2013  2017 Elsevier   Fall Prevention in the Home Introduction Falls can cause injuries. They can happen to people of all ages. There are many things you can do to make your home safe and to help prevent falls. What can I do on the outside of my home?  Regularly fix the edges of walkways and driveways and fix any  cracks.  Remove anything that might make you trip as you walk through a door, such as a raised step or threshold.  Trim any bushes or trees on the path to your home.  Use bright outdoor lighting.  Clear any walking paths of anything that might make someone trip, such as rocks or tools.  Regularly check to see if handrails are loose or broken. Make sure that both sides of any steps have handrails.  Any raised decks and porches should have guardrails on the edges.  Have any leaves, snow, or ice cleared regularly.  Use sand or salt on  walking paths during winter.  Clean up any spills in your garage right away. This includes oil or grease spills. What can I do in the bathroom?  Use night lights.  Install grab bars by the toilet and in the tub and shower. Do not use towel bars as grab bars.  Use non-skid mats or decals in the tub or shower.  If you need to sit down in the shower, use a plastic, non-slip stool.  Keep the floor dry. Clean up any water that spills on the floor as soon as it happens.  Remove soap buildup in the tub or shower regularly.  Attach bath mats securely with double-sided non-slip rug tape.  Do not have throw rugs and other things on the floor that can make you trip. What can I do in the bedroom?  Use night lights.  Make sure that you have a light by your bed that is easy to reach.  Do not use any sheets or blankets that are too big for your bed. They should not hang down onto the floor.  Have a firm chair that has side arms. You can use this for support while you get dressed.  Do not have throw rugs and other things on the floor that can make you trip. What can I do in the kitchen?  Clean up any spills right away.  Avoid walking on wet floors.  Keep items that you use a lot in easy-to-reach places.  If you need to reach something above you, use a strong step stool that has a grab bar.  Keep electrical cords out of the way.  Do not use floor polish or wax that makes floors slippery. If you must use wax, use non-skid floor wax.  Do not have throw rugs and other things on the floor that can make you trip. What can I do with my stairs?  Do not leave any items on the stairs.  Make sure that there are handrails on both sides of the stairs and use them. Fix handrails that are broken or loose. Make sure that handrails are as long as the stairways.  Check any carpeting to make sure that it is firmly attached to the stairs. Fix any carpet that is loose or worn.  Avoid having throw  rugs at the top or bottom of the stairs. If you do have throw rugs, attach them to the floor with carpet tape.  Make sure that you have a light switch at the top of the stairs and the bottom of the stairs. If you do not have them, ask someone to add them for you. What else can I do to help prevent falls?  Wear shoes that:  Do not have high heels.  Have rubber bottoms.  Are comfortable and fit you well.  Are closed at the toe. Do not wear sandals.  If you use a  stepladder:  Make sure that it is fully opened. Do not climb a closed stepladder.  Make sure that both sides of the stepladder are locked into place.  Ask someone to hold it for you, if possible.  Clearly mark and make sure that you can see:  Any grab bars or handrails.  First and last steps.  Where the edge of each step is.  Use tools that help you move around (mobility aids) if they are needed. These include:  Canes.  Walkers.  Scooters.  Crutches.  Turn on the lights when you go into a dark area. Replace any light bulbs as soon as they burn out.  Set up your furniture so you have a clear path. Avoid moving your furniture around.  If any of your floors are uneven, fix them.  If there are any pets around you, be aware of where they are.  Review your medicines with your doctor. Some medicines can make you feel dizzy. This can increase your chance of falling. Ask your doctor what other things that you can do to help prevent falls. This information is not intended to replace advice given to you by your health care provider. Make sure you discuss any questions you have with your health care provider. Document Released: 10/18/2008 Document Revised: 05/30/2015 Document Reviewed: 01/26/2014  2017 Elsevier

## 2016-02-26 ENCOUNTER — Other Ambulatory Visit: Payer: Self-pay

## 2016-02-26 MED ORDER — LOSARTAN POTASSIUM 100 MG PO TABS: ORAL_TABLET | ORAL | 0 refills | Status: DC

## 2016-02-26 MED ORDER — METOPROLOL SUCCINATE ER 50 MG PO TB24: 50.0000 mg | ORAL_TABLET | Freq: Every day | ORAL | 0 refills | Status: DC

## 2016-02-26 MED ORDER — ROSUVASTATIN CALCIUM 20 MG PO TABS: ORAL_TABLET | ORAL | 0 refills | Status: DC

## 2016-02-28 ENCOUNTER — Other Ambulatory Visit: Payer: Self-pay | Admitting: Internal Medicine

## 2016-02-28 ENCOUNTER — Encounter: Payer: Self-pay | Admitting: Internal Medicine

## 2016-03-04 ENCOUNTER — Other Ambulatory Visit: Payer: Self-pay | Admitting: Internal Medicine

## 2016-03-10 ENCOUNTER — Ambulatory Visit
Admission: RE | Admit: 2016-03-10 | Discharge: 2016-03-10 | Disposition: A | Discharge status: Home / Self Care | Payer: Medicare PPO | Source: Ambulatory Visit | Attending: Internal Medicine | Admitting: Internal Medicine

## 2016-03-10 DIAGNOSIS — Z1231 Encounter for screening mammogram for malignant neoplasm of breast: Secondary | ICD-10-CM

## 2016-03-11 ENCOUNTER — Other Ambulatory Visit: Payer: Self-pay | Admitting: Internal Medicine

## 2016-03-11 LAB — HM MAMMOGRAPHY

## 2016-03-17 ENCOUNTER — Encounter: Payer: Self-pay | Admitting: Internal Medicine

## 2016-03-17 ENCOUNTER — Ambulatory Visit (INDEPENDENT_AMBULATORY_CARE_PROVIDER_SITE_OTHER): Payer: Medicare PPO | Admitting: Internal Medicine

## 2016-03-17 ENCOUNTER — Telehealth: Payer: Self-pay | Admitting: Radiology

## 2016-03-17 ENCOUNTER — Ambulatory Visit (INDEPENDENT_AMBULATORY_CARE_PROVIDER_SITE_OTHER): Payer: Medicare PPO

## 2016-03-17 VITALS — BP 124/76 | HR 81 | Temp 97.7°F | Wt 247.5 lb

## 2016-03-17 DIAGNOSIS — M545 Low back pain: Secondary | ICD-10-CM

## 2016-03-17 DIAGNOSIS — G629 Polyneuropathy, unspecified: Secondary | ICD-10-CM | POA: Diagnosis not present

## 2016-03-17 DIAGNOSIS — M79642 Pain in left hand: Secondary | ICD-10-CM

## 2016-03-17 DIAGNOSIS — E7800 Pure hypercholesterolemia, unspecified: Secondary | ICD-10-CM | POA: Diagnosis not present

## 2016-03-17 DIAGNOSIS — S62102A Fracture of unspecified carpal bone, left wrist, initial encounter for closed fracture: Secondary | ICD-10-CM | POA: Diagnosis not present

## 2016-03-17 DIAGNOSIS — G8929 Other chronic pain: Secondary | ICD-10-CM

## 2016-03-17 DIAGNOSIS — D7589 Other specified diseases of blood and blood-forming organs: Secondary | ICD-10-CM

## 2016-03-17 DIAGNOSIS — L4050 Arthropathic psoriasis, unspecified: Secondary | ICD-10-CM

## 2016-03-17 DIAGNOSIS — G40909 Epilepsy, unspecified, not intractable, without status epilepticus: Secondary | ICD-10-CM | POA: Diagnosis not present

## 2016-03-17 DIAGNOSIS — E669 Obesity, unspecified: Secondary | ICD-10-CM | POA: Diagnosis not present

## 2016-03-17 DIAGNOSIS — Z6839 Body mass index (BMI) 39.0-39.9, adult: Secondary | ICD-10-CM

## 2016-03-17 DIAGNOSIS — IMO0001 Reserved for inherently not codable concepts without codable children: Secondary | ICD-10-CM

## 2016-03-17 DIAGNOSIS — I482 Chronic atrial fibrillation, unspecified: Secondary | ICD-10-CM

## 2016-03-17 DIAGNOSIS — I10 Essential (primary) hypertension: Secondary | ICD-10-CM | POA: Diagnosis not present

## 2016-03-17 DIAGNOSIS — M25532 Pain in left wrist: Secondary | ICD-10-CM | POA: Diagnosis not present

## 2016-03-17 DIAGNOSIS — I2510 Atherosclerotic heart disease of native coronary artery without angina pectoris: Secondary | ICD-10-CM

## 2016-03-17 DIAGNOSIS — E1140 Type 2 diabetes mellitus with diabetic neuropathy, unspecified: Secondary | ICD-10-CM

## 2016-03-17 DIAGNOSIS — R296 Repeated falls: Secondary | ICD-10-CM

## 2016-03-17 DIAGNOSIS — J208 Acute bronchitis due to other specified organisms: Secondary | ICD-10-CM

## 2016-03-17 NOTE — Telephone Encounter (Signed)
Called ortho spoke to [place] she has sent message to have reviewed and will call back with app.

## 2016-03-17 NOTE — Telephone Encounter (Signed)
Pt disk made of xray of left hand and left wrist. CD was put up front in folder for pt pickup.

## 2016-03-17 NOTE — Progress Notes (Signed)
Patient ID: Brooke Baker, female   DOB: 1934-12-25, 82 y.o.   MRN: [id number]   Subjective:    Patient ID: Brooke Baker, female    DOB: [birth year], 82 y.o.   MRN: [id number]  HPI  Patient here for a scheduled follow up.  She is accompanied by her husband.  History obtained from both of them.  She has fallen a few time in the last few weeks.  She states her last fall was 6 days ago.  She fell and hit her left hand/wrist on the wall.  Has had pain and swelling since.  No head injury.  No other injury with the fall.  States she loses her balance and falls.  No dizziness.  No light headedness.  No syncope or near syncope.  No chest pain.   Uses her walker.  Her husband states she let go of it to do something and then loses her balance.  States otherwise doing ok.  Some lower extremity swelling.  She has been sleeping in a recliner.  Has been having problems with increased cough and congestion.  Has seen pulmonary.  Treated.  Better.     Past Medical History:  Diagnosis Date  . Acute bronchitis   . Allergic rhinitis   . Arthritis   . CAD (coronary artery disease)    s/p stent LAD 8/03 and stent placement OM1  . Cancer ([place])    skin  . Chronic back pain   . COPD with asthma ([place])   . CVA (cerebral vascular accident) ([place])   . Diabetes mellitus ([place])   . Hypercholesterolemia   . Hypertension   . Seizure disorder [facility])    Past Surgical History:  Procedure Laterality Date  . BREAST BIOPSY Bilateral    axillas  . CORONARY STENT PLACEMENT  8/03   LAD  . CORONARY STENT PLACEMENT     OM1  2006  . FEMUR FRACTURE SURGERY Right [id number]   Dr. Marry Guan  . LUMBAR SPINE SURGERY     x3  . TOTAL ABDOMINAL HYSTERECTOMY     appendectomy - age 43  . TOTAL HIP ARTHROPLASTY    . TOTAL KNEE ARTHROPLASTY     2005   Family History  Problem Relation Age of Onset  . Allergies Brother   . Cancer Brother   . Allergies Sister   . Cancer Sister     Non-hodgkins lymphoma  . Asthma Sister   . Rheum  arthritis Sister   . Stroke Mother   . Asthma Brother   . Rheum arthritis Brother   . Cancer Sister     non-hodgkins lymphoma  . Diabetes Brother    Social History   Social History  . Marital status: Married    Spouse name: N/A  . Number of children: 3  . Years of education: hs   Occupational History  . 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 Smoking status: Never Smoker  . Smokeless tobacco: Never Used  . Alcohol use No  . Drug use: No  . Sexual activity: No   Other Topics Concern  . None   Social History Narrative  . None    Outpatient Encounter Prescriptions as of 03/17/2016  Medication Sig  . albuterol (PROVENTIL) (2.5 MG/3ML) 0.083% nebulizer solution USE ONE VIAL VIA NEBULZIER EVERY FOUR HOURS AS NEEDED FOR WHEEZING  . albuterol (VENTOLIN HFA) 108 (90 Base) MCG/ACT inhaler 2 puffs every 4 hours as directed-  rescue  . amLODipine (NORVASC) 10 MG tablet TAKE ONE (1) TABLET EACH DAY  . aspirin 81 MG tablet Take 81 mg by mouth daily.    . budesonide-formoterol (SYMBICORT) 80-4.5 MCG/ACT inhaler Inhale 2 puffs into the lungs 2 (two) times daily.  . dabigatran (PRADAXA) 150 MG CAPS Take 150 mg by mouth every 12 (twelve) hours.  [name] EPINEPHrine (EPIPEN) 0.3 mg/0.3 mL DEVI Inject 0.3 mLs (0.3 mg total) into the muscle once.  . fluticasone (FLONASE) 50 MCG/ACT nasal spray USE TWO SPRAYS IN EACH NOSTRIL EACH DAY AS DIRECTED BY PHYSICIAN  . hydrochlorothiazide (HYDRODIURIL) 25 MG tablet TAKE ONE (1) TABLET EACH DAY  . levETIRAcetam (KEPPRA) 750 MG tablet Take 1 tablet (750 mg total) by mouth 2 (two) times daily.  [name] losartan (COZAAR) 100 MG tablet TAKE ONE (1) TABLET EACH DAY  . metoprolol succinate (TOPROL-XL) 50 MG 24 hr tablet Take 1 tablet (50 mg total) by mouth at bedtime. Take with or immediately following a meal.  . NONFORMULARY OR COMPOUNDED ITEM Allergy Vaccine 1:10 Given at Home  . Omega-3 Fatty Acids (FISH OIL) 1000 MG CAPS Take 1 capsule  by mouth 2 (two) times daily.    . phenytoin (DILANTIN) 100 MG ER capsule Take 4 capsules (400 mg total) by mouth at bedtime.  . rosuvastatin (CRESTOR) 20 MG tablet TAKE ONE (1) TABLET EACH DAY  . sertraline (ZOLOFT) 50 MG tablet TAKE 1 AND ONE-HALF TABLETS BY MOUTH ONCE DAILY   No facility-administered encounter medications on file as of 03/17/2016.     Review of Systems  Constitutional: Negative for appetite change and unexpected weight change.  HENT: Negative for congestion and sinus pressure.   Respiratory: Positive for cough. Negative for chest tightness.        Breathing better.  Cough better.    Cardiovascular: Positive for leg swelling. Negative for chest pain and palpitations.  Gastrointestinal: Negative for abdominal pain, diarrhea, nausea and vomiting.  Genitourinary: Negative for difficulty urinating and dysuria.  Musculoskeletal: Negative for joint swelling and myalgias.  Skin: Negative for color change and wound.  Neurological: Negative for dizziness, light-headedness and headaches.  Psychiatric/Behavioral: Negative for agitation and dysphoric mood.       Objective:    Physical Exam  Constitutional: She appears well-developed and well-nourished. No distress.  HENT:  Nose: Nose normal.  Mouth/Throat: Oropharynx is clear and moist.  Neck: Neck supple. No thyromegaly present.  Cardiovascular: Normal rate and regular rhythm.   Pulmonary/Chest: Breath sounds normal. No respiratory distress. She has no wheezes.  Abdominal: Soft. Bowel sounds are normal. There is no tenderness.  Musculoskeletal: She exhibits no edema.  Increased soft tissue welling - left wrist.  Increased pain base of hand and left wrist.  Increased pain with flexion of wrist.  Some lower extremity swelling.  Stable.   Lymphadenopathy:    She has no cervical adenopathy.  Skin: No rash noted. No erythema.  Psychiatric: She has a normal mood and affect. Her behavior is normal.    BP 124/76 (BP Location:  Right Arm, Patient Position: Sitting, Cuff Size: Large)   Pulse 81   Temp 97.7 F (36.5 C) (Oral)   Wt 247 lb 8 oz (112.3 kg)   LMP 08/17/1965   SpO2 96%   BMI 39.95 kg/m  Wt Readings from Last 3 Encounters:  03/17/16 247 lb 8 oz (112.3 kg)  02/25/16 246 lb 9.6 oz (111.9 kg)  01/29/16 239 lb (108.4 kg)     Lab Results  Component Value Date   WBC 6.0 09/13/2015   HGB 12.3 09/13/2015   HCT 35.7 (L) 09/13/2015   PLT 153.0 09/13/2015   GLUCOSE 158 (H) 09/19/2015   CHOL 117 09/13/2015   TRIG 62.0 09/13/2015   HDL 45.50 09/13/2015   LDLCALC 59 09/13/2015   ALT 15 09/13/2015   AST 24 09/13/2015   NA 142 09/19/2015   K 4.5 09/19/2015   CL 109 09/19/2015   CREATININE 1.05 (H) 09/19/2015   BUN 16 09/19/2015   CO2 21 09/19/2015   TSH 2.77 09/13/2015   INR 1.3 05/12/2012   HGBA1C 5.5 09/13/2015   MICROALBUR 4.3 (H) 10/26/2014    Mm Screening Breast Tomo Bilateral  Result Date: 03/11/2016 CLINICAL DATA:  Screening. EXAM: 2D DIGITAL SCREENING BILATERAL MAMMOGRAM WITH CAD AND ADJUNCT TOMO COMPARISON:  Previous exam(s). ACR Breast Density Category b: There are scattered areas of fibroglandular density. FINDINGS: There are no findings suspicious for malignancy. Images were processed with CAD. IMPRESSION: No mammographic evidence of malignancy. A result letter of this screening mammogram will be mailed directly to the patient. RECOMMENDATION: Screening mammogram in one year. (Code:SM-B-01Y) BI-RADS CATEGORY  1: Negative. Electronically Signed   By: Pamelia Hoit M.D.   On: 03/11/2016 12:55       Assessment & Plan:   Problem List Items Addressed This Visit    ASTHMATIC BRONCHITIS, ACUTE    Followed by pulmonary.  Recently treated.  better.        Atrial fibrillation ([place])    Rated controlled.  Follow.        Chronic back pain    Limits her mobility.        Coronary atherosclerosis    Followed by cardiology.  Stable.  Continue risk factor modification.       Diabetes  mellitus ([place])    Low carb diet and exercise.  Follow met b and a1c.  Up to date with eye checks.        Relevant Orders   Hemoglobin A1c   Microalbumin / creatinine urine ratio   Falls frequently    Has had recent falls as outlined.  States feels losing her balance.  Husband states she goes to ben over and then falls.  Unable to get on the table to day for exam.  Hard to maneuver legs.  Stiff.  Discussed falls and weakness.  Discussed the need for home PT for evaluation and treatment.  Referral place for home health PT.        Relevant Orders   Ambulatory referral to Home Health   Hypercholesterolemia    On crestor.  Low cholesterol diet and exercise.  Follow lipid panel and liver function tests.        Relevant Orders   Hepatic function panel   Lipid panel   Hypertension    Blood pressure under good control.  Continue same medication regimen.  Follow pressures.  Follow metabolic panel.        Relevant Orders   Basic metabolic panel   Macrocytosis   Relevant Orders   CBC with Differential/Platelet   Neuropathy ([place])    Has desired no further treatment.        Obesity    Diet and exercise.  Follow.       Psoriatic arthritis ([place])    Followed by rheumatollogy.        Seizure disorder [facility])    Seeing neurology.  No seizures.  Follow.  Other Visit Diagnoses    Left hand pain    -  Primary   persistent pain s/p fall.  check xray.     Relevant Orders   DG Hand 2 View Left (Completed)   Ambulatory referral to Orthopedic Surgery   Left wrist pain       persistent pain and swelling s/p fall.  check xray.  may need ortho evaluation.    Relevant Orders   DG Wrist Complete Left (Completed)   Ambulatory referral to Orthopedic Surgery   Closed fracture of left wrist, initial encounter       Relevant Orders   Ambulatory referral to Orthopedic Surgery       Einar Pheasant, MD

## 2016-03-17 NOTE — Progress Notes (Signed)
Pre visit review using our clinic review tool, if applicable. No additional management support is needed unless otherwise documented below in the visit note. 

## 2016-03-17 NOTE — Telephone Encounter (Signed)
Pt notified of left wrist fracture on xray.  Order placed for referral to ortho.  Disc available if needs.

## 2016-03-18 ENCOUNTER — Encounter: Payer: Self-pay | Admitting: Internal Medicine

## 2016-03-18 DIAGNOSIS — R296 Repeated falls: Secondary | ICD-10-CM | POA: Insufficient documentation

## 2016-03-18 NOTE — Assessment & Plan Note (Signed)
Limits her mobility. 

## 2016-03-18 NOTE — Assessment & Plan Note (Signed)
Seeing neurology.  No seizures.  Follow.

## 2016-03-18 NOTE — Assessment & Plan Note (Signed)
Low carb diet and exercise.  Follow met b and a1c.  Up to date with eye checks.

## 2016-03-18 NOTE — Telephone Encounter (Signed)
Called l/m with referral person to call back

## 2016-03-18 NOTE — Assessment & Plan Note (Signed)
Followed by cardiology.  Stable.  Continue risk factor modification.   

## 2016-03-18 NOTE — Assessment & Plan Note (Signed)
Blood pressure under good control.  Continue same medication regimen.  Follow pressures.  Follow metabolic panel.   

## 2016-03-18 NOTE — Assessment & Plan Note (Signed)
Has had recent falls as outlined.  States feels losing her balance.  Husband states she goes to ben over and then falls.  Unable to get on the table to day for exam.  Hard to maneuver legs.  Stiff.  Discussed falls and weakness.  Discussed the need for home PT for evaluation and treatment.  Referral place for home health PT.

## 2016-03-18 NOTE — Assessment & Plan Note (Signed)
Followed by pulmonary.  Recently treated.  better.

## 2016-03-18 NOTE — Assessment & Plan Note (Signed)
Followed by rheumatollogy.

## 2016-03-18 NOTE — Telephone Encounter (Signed)
Called pt let her know that app has been made with lance at 10:30 on 03-20-16. She is to avoid any pressure on wrist and try to keep wrapped. She did not have any questions and will call if any changes.

## 2016-03-18 NOTE — Assessment & Plan Note (Signed)
Rated controlled.  Follow.

## 2016-03-18 NOTE — Assessment & Plan Note (Signed)
Has desired no further treatment.

## 2016-03-18 NOTE — Assessment & Plan Note (Signed)
Diet and exercise.  Follow.  

## 2016-03-18 NOTE — Assessment & Plan Note (Signed)
On crestor.  Low cholesterol diet and exercise.  Follow lipid panel and liver function tests.   

## 2016-03-19 ENCOUNTER — Ambulatory Visit: Payer: Medicare PPO | Admitting: Internal Medicine

## 2016-03-19 NOTE — Telephone Encounter (Signed)
That was v/m from yesterday I have spoke to patient.

## 2016-03-19 NOTE — Telephone Encounter (Signed)
Pt called and left a voicemail stating that she is returning your call. Please advise, thank you!  Call pt @ [phone]

## 2016-04-07 ENCOUNTER — Other Ambulatory Visit: Payer: Self-pay | Admitting: Internal Medicine

## 2016-04-08 ENCOUNTER — Telehealth: Payer: Self-pay | Admitting: Internal Medicine

## 2016-04-08 MED ORDER — PREDNISONE 10 MG PO TABS: ORAL_TABLET | ORAL | 0 refills | Status: DC

## 2016-04-08 NOTE — Telephone Encounter (Signed)
Offer prednisone 10 mg, # 9      2 daily x 3 days, then one daily x 3 days

## 2016-04-08 NOTE — Telephone Encounter (Signed)
Spoke with pt, aware of recs.  rx sent to preferred pharmacy.  Nothing further needed.  

## 2016-04-08 NOTE — Telephone Encounter (Signed)
Pt c/o asthma flare-up; sob with any exertion, wheezing, prod cough with clear mucus.  S/s present X1 day.   Denies fever, chest pain, sinus congestion.    Pt taking mucinex to help with symptoms.  Requesting further recs.    Pt uses [facility].  CY please advise on further recs.  Thanks!

## 2016-04-08 NOTE — Telephone Encounter (Signed)
Patient checking on status of message - she can be reached at [phone] -pr

## 2016-04-13 ENCOUNTER — Telehealth: Payer: Self-pay | Admitting: Internal Medicine

## 2016-04-13 NOTE — Telephone Encounter (Signed)
Prednisone 10 mg, # 20, 4 X 2 DAYS, 3 X 2 DAYS, 2 X 2 DAYS, 1 X 2 DAYS  

## 2016-04-13 NOTE — Telephone Encounter (Signed)
LM x 1 for pt 

## 2016-04-13 NOTE — Telephone Encounter (Signed)
Spoke with pt and made her aware of CY's recommendations. Pt is requesting another round of prednisone as well.  CY please advise. Thanks.

## 2016-04-13 NOTE — Telephone Encounter (Signed)
Pt reports of increased sob, weakness, prod cough with white to yellow mucus  & nasal drainage x1w Finished prednisone yesterday with no improvement that was prescribed on 04-08-16. Denies any fever, chills or sweats.   CY please advise. Thanks.

## 2016-04-13 NOTE — Telephone Encounter (Signed)
Offer Z pak 250 mg, # 6, 2 today then one daily  Suggest Mucinex-DM otc to help with the cough

## 2016-04-14 ENCOUNTER — Ambulatory Visit
Admission: RE | Admit: 2016-04-14 | Discharge: 2016-04-14 | Disposition: A | Discharge status: Home / Self Care | Payer: Medicare PPO | Source: Ambulatory Visit | Attending: Internal Medicine | Admitting: Internal Medicine

## 2016-04-14 DIAGNOSIS — Z7952 Long term (current) use of systemic steroids: Secondary | ICD-10-CM | POA: Diagnosis not present

## 2016-04-14 DIAGNOSIS — M8588 Other specified disorders of bone density and structure, other site: Secondary | ICD-10-CM | POA: Insufficient documentation

## 2016-04-14 DIAGNOSIS — E2839 Other primary ovarian failure: Secondary | ICD-10-CM | POA: Diagnosis not present

## 2016-04-14 MED ORDER — AZITHROMYCIN 250 MG PO TABS: ORAL_TABLET | ORAL | 0 refills | Status: AC

## 2016-04-14 MED ORDER — PREDNISONE 10 MG PO TABS: ORAL_TABLET | ORAL | 0 refills | Status: DC

## 2016-04-14 NOTE — Telephone Encounter (Signed)
Spoke with patient, aware of rec's per CY.  Pred and Zpak called into [facility].  Nothing further needed.

## 2016-04-15 ENCOUNTER — Telehealth: Payer: Self-pay

## 2016-04-15 NOTE — Telephone Encounter (Signed)
Left voice mail to call back 

## 2016-04-15 NOTE — Telephone Encounter (Signed)
Left message to return call to our office.  

## 2016-04-15 NOTE — Telephone Encounter (Signed)
Patient informed did not have any questions will call if any problems.

## 2016-04-15 NOTE — Telephone Encounter (Signed)
Pt called back returning your call °

## 2016-04-15 NOTE — Telephone Encounter (Signed)
-----   Message from Einar Pheasant, MD sent at 04/15/2016  5:43 AM EDT ----- Notify pt that her bone density reveals osteopenia.  Some bone loss, but does not meet criteria for osteoporosis.  Continue dietary calcium and vitamin D.  Weight bearing exercise - ex walking.  Can discuss other treatment options at her next appt.

## 2016-04-17 DIAGNOSIS — S52552A Other extraarticular fracture of lower end of left radius, initial encounter for closed fracture: Secondary | ICD-10-CM | POA: Diagnosis not present

## 2016-04-17 DIAGNOSIS — S52552D Other extraarticular fracture of lower end of left radius, subsequent encounter for closed fracture with routine healing: Secondary | ICD-10-CM | POA: Diagnosis not present

## 2016-04-20 ENCOUNTER — Other Ambulatory Visit: Payer: Self-pay | Admitting: Internal Medicine

## 2016-05-07 ENCOUNTER — Encounter: Payer: Self-pay | Admitting: Internal Medicine

## 2016-05-07 ENCOUNTER — Ambulatory Visit (INDEPENDENT_AMBULATORY_CARE_PROVIDER_SITE_OTHER): Payer: Medicare PPO | Admitting: Internal Medicine

## 2016-05-07 ENCOUNTER — Ambulatory Visit (INDEPENDENT_AMBULATORY_CARE_PROVIDER_SITE_OTHER)
Admission: RE | Admit: 2016-05-07 | Discharge: 2016-05-07 | Disposition: A | Discharge status: Home / Self Care | Payer: Medicare PPO | Source: Ambulatory Visit | Attending: Internal Medicine | Admitting: Internal Medicine

## 2016-05-07 VITALS — BP 122/78 | HR 83 | Resp 16 | Ht 66.0 in | Wt 238.2 lb

## 2016-05-07 DIAGNOSIS — J45998 Other asthma: Secondary | ICD-10-CM

## 2016-05-07 DIAGNOSIS — J4540 Moderate persistent asthma, uncomplicated: Secondary | ICD-10-CM

## 2016-05-07 DIAGNOSIS — I482 Chronic atrial fibrillation, unspecified: Secondary | ICD-10-CM

## 2016-05-07 DIAGNOSIS — R0602 Shortness of breath: Secondary | ICD-10-CM | POA: Diagnosis not present

## 2016-05-07 DIAGNOSIS — J4541 Moderate persistent asthma with (acute) exacerbation: Secondary | ICD-10-CM

## 2016-05-07 MED ORDER — TIOTROPIUM BROMIDE MONOHYDRATE 2.5 MCG/ACT IN AERS: 2.0000 | INHALATION_SPRAY | Freq: Every day | RESPIRATORY_TRACT | 0 refills | Status: DC

## 2016-05-07 NOTE — Patient Instructions (Addendum)
Order- office spirometry  Dx asthma mild intermittent  Order- CXR  Dx Asthma moderate persistent  Order- schedule PFT    Dx Asthma moderate persistent  Continue present meds  Sample Spiriva Respimat 2.5 2 puffs once daily.   Please call as needed  We have stopped your allergy shots.

## 2016-05-07 NOTE — Progress Notes (Signed)
Patient ID: Brooke Baker, female    DOB: 1934-08-31, 82 y.o.   MRN: [id number]  HPI female never smoker followed for Asthma, allergic rhinitis, complicated by history DM, CAD, CVA/A. Fib/ anticoagulation, Seizure disorder Office Spirometry 05/07/16-moderately severe restriction of exhaled volume, moderate obstructive airways disease. FVC 1.59/55%, FEV1 1.14/52%, ratio 0.71, FEF 25-75% 0.75/47%  ------------------------------------------------------------------------------------  11/18/2015-82 year old female never smoker followed for Asthma, allergic rhinitis, complicated by history DM, CAD, CVA/A. fib Allergy Vaccine 1:10 GO ACUTE VISIT:Pt states she is having asthma flare up-wheezing, cough-productive, and SOB. She and husband report she got wet and chilled 2 weeks ago with onset of wheeze and cough, thick yellow sputum. No sore throat or fever. Using nebulizer 3 times daily, Symbicort twice, rescue inhaler 3 or 4 times daily.  05/07/16- 82 year old female never smoker followed for Asthma, allergic rhinitis, complicated by history DM, CAD, CVA/A. Fib/ anticoagulation, Seizure disorder Got Augmentin and prednisone taper 1/24 Called prednisone taper and Z-Pak 4/9 Had been sleeping in recliner FOLLOWS FOR: Pt denies any wheezing,cough, congestion, or SOB at this time. Doing well overall.  Patient seen in the office today and instructed on use of Spiriva Respimat 2.5.  Patient expressed understanding and demonstrated technique. [name]  CXR 01/29/16- IMPRESSION: Reactive airway disease with chronic interstitial prominence. There may be superimposed acute pneumonitis or acute bronchitis resulting in the increased prominence of the interstitial markings as compared to the previous study. Followup PA and lateral chest X-ray is recommended in 3-4 weeks following trial of antibiotic therapy to ensure resolution and exclude underlying malignancy Office Spirometry 05/07/16-moderately  severe restriction of exhaled volume, moderate obstructive airways disease . Review of Systems-see HPI Constitutional:   No-   weight loss, night sweats, fevers, chills, + fatigue, lassitude. HEENT:   No-  headaches, difficulty swallowing, tooth/dental problems, sore throat,       No-  sneezing, itching, ear ache, nasal congestion, CV:  No-   chest pain,  +orthopnea, PND, swelling in lower extremities, anasarca,  dizziness, palpitations Resp: +shortness of breath with exertion or at rest.             +  productive cough,  + non-productive cough,  No- coughing up of blood.            + change in color of mucus.  + wheezing.   Skin: No-   rash or lesions. GI:  No-   heartburn, indigestion, abdominal pain, nausea, vomiting,  GU: MS:  No-   joint pain or swelling.  . Neuro-     nothing unusual Psych:  No- change in mood or affect. No depression or anxiety.  No memory loss.   Objective:   Physical Exam General- Alert, Oriented, Affect-appropriate, Distress- none acute,   +obese Skin- rash-none, lesions- none, excoriation- none Lymphadenopathy- none Head- atraumatic            Eyes- Gross vision intact, PERRLA, conjunctivae clear secretions. Bilateral shiners            Ears- Hearing, canals-normal            Nose- Sniffing +, no-Septal dev, mucus, polyps, erosion, perforation             Throat- Mallampati II , mucosa clear , drainage- none, tonsils- atrophic Neck- flexible , trachea midline, no stridor , thyroid nl, carotid no bruit Chest - symmetrical excursion , unlabored           Heart/CV-+ IRR , no murmur ,  no gallop  , no rub, nl s1 s2                           - JVD- none , edema- 1+ , stasis changes- none, varices- none           Lung-     breath sounds -clear/unlabored, wheeze-none            Chest wall-  Abd-  Br/ Gen/ Rectal- Not done, not indicated Extrem- cyanosis- none, clubbing, none, atrophy- none, strength- nl, + heavy legs with edema,           Neuro- grossly  intact to observation

## 2016-05-10 NOTE — Assessment & Plan Note (Addendum)
Sustained exacerbation to the winter and especially in the last few days. She ended allergy vaccine as we closed our program in December. If necessary she will go to another allergy group. Overload diagnosis as asthma, we're going to have her try Spiriva 2.5 to see if it as any additional control. Watch for other diagnoses Plan-CXR, schedule PFT

## 2016-05-10 NOTE — Assessment & Plan Note (Signed)
Rhythm is irregular with controlled ventricular response rate. Followed by cardiology

## 2016-06-26 ENCOUNTER — Other Ambulatory Visit (INDEPENDENT_AMBULATORY_CARE_PROVIDER_SITE_OTHER): Payer: Medicare PPO

## 2016-06-26 DIAGNOSIS — D7589 Other specified diseases of blood and blood-forming organs: Secondary | ICD-10-CM | POA: Diagnosis not present

## 2016-06-26 DIAGNOSIS — E7800 Pure hypercholesterolemia, unspecified: Secondary | ICD-10-CM

## 2016-06-26 DIAGNOSIS — I10 Essential (primary) hypertension: Secondary | ICD-10-CM | POA: Diagnosis not present

## 2016-06-26 DIAGNOSIS — E1140 Type 2 diabetes mellitus with diabetic neuropathy, unspecified: Secondary | ICD-10-CM

## 2016-06-26 LAB — CBC WITH DIFFERENTIAL/PLATELET
BASOS ABS: 0 10*3/uL (ref 0.0–0.1)
Basophils Relative: 0.4 % (ref 0.0–3.0)
Eosinophils Absolute: 0.9 10*3/uL — ABNORMAL HIGH (ref 0.0–0.7)
Eosinophils Relative: 15.8 % — ABNORMAL HIGH (ref 0.0–5.0)
HCT: 36.1 % (ref 36.0–46.0)
Hemoglobin: 12.3 g/dL (ref 12.0–15.0)
LYMPHS ABS: 1 10*3/uL (ref 0.7–4.0)
LYMPHS PCT: 17.9 % (ref 12.0–46.0)
MCHC: 34.1 g/dL (ref 30.0–36.0)
MCV: 100.9 fl — AB (ref 78.0–100.0)
MONOS PCT: 13.2 % — AB (ref 3.0–12.0)
Monocytes Absolute: 0.7 10*3/uL (ref 0.1–1.0)
NEUTROS PCT: 52.7 % (ref 43.0–77.0)
Neutro Abs: 3 10*3/uL (ref 1.4–7.7)
Platelets: 158 10*3/uL (ref 150.0–400.0)
RBC: 3.57 Mil/uL — AB (ref 3.87–5.11)
RDW: 13.2 % (ref 11.5–15.5)
WBC: 5.6 10*3/uL (ref 4.0–10.5)

## 2016-06-26 LAB — BASIC METABOLIC PANEL
BUN: 32 mg/dL — ABNORMAL HIGH (ref 6–23)
CALCIUM: 8.8 mg/dL (ref 8.4–10.5)
CHLORIDE: 105 meq/L (ref 96–112)
CO2: 24 mEq/L (ref 19–32)
CREATININE: 1.03 mg/dL (ref 0.40–1.20)
GFR: 54.8 mL/min — AB (ref >60.00)
Glucose, Bld: 117 mg/dL — ABNORMAL HIGH (ref 70–99)
Potassium: 4.8 mEq/L (ref 3.5–5.1)
Sodium: 135 mEq/L (ref 135–145)

## 2016-06-26 LAB — MICROALBUMIN / CREATININE URINE RATIO
CREATININE, U: 79.2 mg/dL
MICROALB UR: 0.8 mg/dL (ref 0.0–1.9)
Microalb Creat Ratio: 1 mg/g (ref 0.0–30.0)

## 2016-06-26 LAB — HEPATIC FUNCTION PANEL
ALK PHOS: 101 U/L (ref 39–117)
ALT: 17 U/L (ref 0–35)
AST: 29 U/L (ref 0–37)
Albumin: 3.2 g/dL — ABNORMAL LOW (ref 3.5–5.2)
BILIRUBIN DIRECT: 0.1 mg/dL (ref 0.0–0.3)
TOTAL PROTEIN: 7.3 g/dL (ref 6.0–8.3)
Total Bilirubin: 0.6 mg/dL (ref 0.2–1.2)

## 2016-06-26 LAB — LIPID PANEL
Cholesterol: 114 mg/dL (ref 0–200)
HDL: 45.9 mg/dL (ref >39.00)
LDL Cholesterol: 54 mg/dL (ref 0–99)
NONHDL: 68.38
Total CHOL/HDL Ratio: 2
Triglycerides: 73 mg/dL (ref 0.0–149.0)
VLDL: 14.6 mg/dL (ref 0.0–40.0)

## 2016-06-26 LAB — HEMOGLOBIN A1C: Hgb A1c MFr Bld: 5.7 % (ref 4.6–6.5)

## 2016-06-30 ENCOUNTER — Ambulatory Visit (INDEPENDENT_AMBULATORY_CARE_PROVIDER_SITE_OTHER): Payer: Medicare PPO | Admitting: Internal Medicine

## 2016-06-30 ENCOUNTER — Encounter: Payer: Self-pay | Admitting: Internal Medicine

## 2016-06-30 VITALS — BP 120/78 | HR 75 | Temp 98.6°F | Resp 12 | Ht 66.0 in | Wt 236.6 lb

## 2016-06-30 DIAGNOSIS — E669 Obesity, unspecified: Secondary | ICD-10-CM

## 2016-06-30 DIAGNOSIS — E1140 Type 2 diabetes mellitus with diabetic neuropathy, unspecified: Secondary | ICD-10-CM

## 2016-06-30 DIAGNOSIS — M545 Low back pain: Secondary | ICD-10-CM

## 2016-06-30 DIAGNOSIS — R0602 Shortness of breath: Secondary | ICD-10-CM | POA: Diagnosis not present

## 2016-06-30 DIAGNOSIS — E7800 Pure hypercholesterolemia, unspecified: Secondary | ICD-10-CM | POA: Diagnosis not present

## 2016-06-30 DIAGNOSIS — I10 Essential (primary) hypertension: Secondary | ICD-10-CM

## 2016-06-30 DIAGNOSIS — F439 Reaction to severe stress, unspecified: Secondary | ICD-10-CM

## 2016-06-30 DIAGNOSIS — Z6839 Body mass index (BMI) 39.0-39.9, adult: Secondary | ICD-10-CM

## 2016-06-30 DIAGNOSIS — G629 Polyneuropathy, unspecified: Secondary | ICD-10-CM

## 2016-06-30 DIAGNOSIS — I482 Chronic atrial fibrillation, unspecified: Secondary | ICD-10-CM

## 2016-06-30 DIAGNOSIS — L4050 Arthropathic psoriasis, unspecified: Secondary | ICD-10-CM

## 2016-06-30 DIAGNOSIS — G40909 Epilepsy, unspecified, not intractable, without status epilepticus: Secondary | ICD-10-CM

## 2016-06-30 DIAGNOSIS — Z0001 Encounter for general adult medical examination with abnormal findings: Secondary | ICD-10-CM

## 2016-06-30 DIAGNOSIS — G8929 Other chronic pain: Secondary | ICD-10-CM

## 2016-06-30 DIAGNOSIS — Z0000 Encounter for general adult medical examination without abnormal findings: Secondary | ICD-10-CM

## 2016-06-30 DIAGNOSIS — I2510 Atherosclerotic heart disease of native coronary artery without angina pectoris: Secondary | ICD-10-CM | POA: Diagnosis not present

## 2016-06-30 DIAGNOSIS — IMO0001 Reserved for inherently not codable concepts without codable children: Secondary | ICD-10-CM

## 2016-06-30 DIAGNOSIS — J4541 Moderate persistent asthma with (acute) exacerbation: Secondary | ICD-10-CM

## 2016-06-30 NOTE — Progress Notes (Signed)
Patient ID: Brooke Baker, female   DOB: [birth year], 82 y.o.   MRN: [id number]   Subjective:    Patient ID: Brooke Baker, female    DOB: 1934-11-22, 83 y.o.   MRN: [id number]  HPI  Patient here for her physical exam.  She is accompanied by her husband.  History obtained from both of them.  She reports she is doing better.  Breathing better.  Cough and congestion better.  Has been seeing Dr Annamaria Boots.  Notes reviewed.  Breathing better.  She does report noticing increased heart rate and palpitations at times.  Also notices recently that she has been more sob with exertion.  Has noticed, even though cough and congestion better.  No chest pain. No increased acid reflux.  No abdominal pain.  Bowels moving.  No urine change.     Past Medical History:  Diagnosis Date  . Acute bronchitis   . Allergic rhinitis   . Arthritis   . CAD (coronary artery disease)    s/p stent LAD 8/03 and stent placement OM1  . Cancer ([place])    skin  . Chronic back pain   . COPD with asthma ([place])   . CVA (cerebral vascular accident) ([place])   . Diabetes mellitus ([place])   . Hypercholesterolemia   . Hypertension   . Seizure disorder [place])    Past Surgical History:  Procedure Laterality Date  . BREAST BIOPSY Bilateral    [place]  . CORONARY STENT PLACEMENT  8/03   LAD  . CORONARY STENT PLACEMENT     OM1  2006  . FEMUR FRACTURE SURGERY Right [id number]   Dr. Marry Guan  . LUMBAR SPINE SURGERY     x3  . TOTAL ABDOMINAL HYSTERECTOMY     appendectomy - age 34  . TOTAL HIP ARTHROPLASTY    . TOTAL KNEE ARTHROPLASTY     2005   Family History  Problem Relation Age of Onset  . Allergies Brother   . Cancer Brother   . Allergies Sister   . Cancer Sister        Non-hodgkins lymphoma  . Asthma Sister   . Rheum arthritis Sister   . Stroke Mother   . Asthma Brother   . Rheum arthritis Brother   . Cancer Sister        non-hodgkins lymphoma  . Diabetes Brother    Social History   Social History  . Marital status:  Married    Spouse name: N/A  . Number of children: 3  . Years of education: hs   Occupational History  . 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 Smoking status: Never Smoker  . Smokeless tobacco: Never Used  . Alcohol use No  . Drug use: No  . Sexual activity: No   Other Topics Concern  . None   Social History Narrative  . None    Outpatient Encounter Prescriptions as of 06/30/2016  Medication Sig  . albuterol (PROVENTIL) (2.5 MG/3ML) 0.083% nebulizer solution USE ONE VIAL VIA NEBULZIER EVERY FOUR HOURS AS NEEDED FOR WHEEZING  . albuterol (VENTOLIN HFA) 108 (90 Base) MCG/ACT inhaler 2 puffs every 4 hours as directed- rescue  . amLODipine (NORVASC) 10 MG tablet TAKE ONE (1) TABLET EACH DAY  . aspirin 81 MG tablet Take 81 mg by mouth daily.    . dabigatran (PRADAXA) 150 MG CAPS Take 150 mg by mouth every 12 (twelve) hours.  [name] EPINEPHrine (EPIPEN) 0.3 mg/0.3  mL DEVI Inject 0.3 mLs (0.3 mg total) into the muscle once.  . fluticasone (FLONASE) 50 MCG/ACT nasal spray USE TWO SPRAYS IN EACH NOSTRIL EACH DAY AS DIRECTED BY PHYSICIAN  . hydrochlorothiazide (HYDRODIURIL) 25 MG tablet TAKE ONE (1) TABLET EACH DAY  . levETIRAcetam (KEPPRA) 750 MG tablet Take 1 tablet (750 mg total) by mouth 2 (two) times daily.  [name] losartan (COZAAR) 100 MG tablet TAKE ONE (1) TABLET EACH DAY  . metoprolol succinate (TOPROL-XL) 50 MG 24 hr tablet Take 1 tablet (50 mg total) by mouth at bedtime. Take with or immediately following a meal.  . metoprolol succinate (TOPROL-XL) 50 MG 24 hr tablet Take 1 tablet (50 mg total) by mouth at bedtime. Take with or immediately following a meal.  . NONFORMULARY OR COMPOUNDED ITEM Allergy Vaccine 1:10 Given at Home  . Omega-3 Fatty Acids (FISH OIL) 1000 MG CAPS Take 1 capsule by mouth 2 (two) times daily.    . phenytoin (DILANTIN) 100 MG ER capsule Take 4 capsules (400 mg total) by mouth at bedtime.  . rosuvastatin (CRESTOR) 20 MG tablet TAKE ONE  (1) TABLET EACH DAY  . sertraline (ZOLOFT) 50 MG tablet TAKE 1 AND ONE-HALF TABLETS BY MOUTH ONCE DAILY  . SYMBICORT 80-4.5 MCG/ACT inhaler USE 2 PUFFS TWICE DAILY  . Tiotropium Bromide Monohydrate (SPIRIVA RESPIMAT) 2.5 MCG/ACT AERS Inhale 2 puffs into the lungs daily.  . [DISCONTINUED] predniSONE (DELTASONE) 10 MG tablet Take 4 tabs po x 2 days, then 3 x 2 days, then 2 x 2 days, then 1 x 2 days then stop.   No facility-administered encounter medications on file as of 06/30/2016.     Review of Systems  Constitutional:       Has adjusted diet.  Lost some weight.    HENT: Negative for congestion and sinus pressure.   Eyes: Negative for pain and visual disturbance.  Respiratory: Positive for shortness of breath. Negative for chest tightness.        Cough and congestion better.  Breathing better.    Cardiovascular: Negative for chest pain and palpitations.  Gastrointestinal: Negative for abdominal pain, diarrhea, nausea and vomiting.  Genitourinary: Negative for difficulty urinating and dysuria.  Musculoskeletal: Negative for joint swelling.       Chronic back pain.   Skin: Negative for color change and rash.  Neurological: Negative for dizziness, light-headedness and headaches.  Hematological: Negative for adenopathy. Does not bruise/bleed easily.  Psychiatric/Behavioral: Negative for agitation and dysphoric mood.       Objective:    Physical Exam  Constitutional: She is oriented to person, place, and time. She appears well-developed and well-nourished. No distress.  HENT:  Nose: Nose normal.  Mouth/Throat: Oropharynx is clear and moist.  Eyes: Right eye exhibits no discharge. Left eye exhibits no discharge. No scleral icterus.  Neck: Neck supple. No thyromegaly present.  Cardiovascular: Normal rate.   Rate controlled.   Pulmonary/Chest: Breath sounds normal. No accessory muscle usage. No tachypnea. No respiratory distress. She has no decreased breath sounds. She has no wheezes.  She has no rhonchi. Right breast exhibits no inverted nipple, no mass, no nipple discharge and no tenderness (no axillary adenopathy). Left breast exhibits no inverted nipple, no mass, no nipple discharge and no tenderness (no axilarry adenopathy).  Abdominal: Soft. Bowel sounds are normal. There is no tenderness.  Musculoskeletal: She exhibits no tenderness.  No increased edema.    Lymphadenopathy:    She has no cervical adenopathy.  Neurological: She is alert  and oriented to person, place, and time.  Skin: Skin is warm. No rash noted. No erythema.  Psychiatric: She has a normal mood and affect. Her behavior is normal.    BP 120/78 (BP Location: Left Arm, Patient Position: Sitting, Cuff Size: Normal)   Pulse 75   Temp 98.6 F (37 C) (Oral)   Resp 12   Ht 5' 6"  (1.676 m)   Wt 236 lb 9.6 oz (107.3 kg)   LMP 08/17/1965   SpO2 95%   BMI 38.19 kg/m  Wt Readings from Last 3 Encounters:  06/30/16 236 lb 9.6 oz (107.3 kg)  05/07/16 238 lb 3.2 oz (108 kg)  03/17/16 247 lb 8 oz (112.3 kg)     Lab Results  Component Value Date   WBC 5.6 06/26/2016   HGB 12.3 06/26/2016   HCT 36.1 06/26/2016   PLT 158.0 06/26/2016   GLUCOSE 117 (H) 06/26/2016   CHOL 114 06/26/2016   TRIG 73.0 06/26/2016   HDL 45.90 06/26/2016   LDLCALC 54 06/26/2016   ALT 17 06/26/2016   AST 29 06/26/2016   NA 135 06/26/2016   K 4.8 06/26/2016   CL 105 06/26/2016   CREATININE 1.03 06/26/2016   BUN 32 (H) 06/26/2016   CO2 24 06/26/2016   TSH 2.77 09/13/2015   INR 1.3 05/12/2012   HGBA1C 5.7 06/26/2016   MICROALBUR 0.8 06/26/2016    Dg Chest 2 View  Result Date: 05/07/2016 CLINICAL DATA:  82 year old female with chronic shortness of breath. EXAM: CHEST  2 VIEW COMPARISON:  01/29/2016 and earlier. FINDINGS: Chronic mild to moderate pulmonary hyperinflation suspected. Chronic bilateral increased interstitial opacity. No pneumothorax, pulmonary edema, pleural effusion or acute pulmonary opacity. Cardiac size is  at the upper limits of normal. Calcified aortic atherosclerosis. Other mediastinal contours are within normal limits. Visualized tracheal air column is within normal limits. Osteopenia. No acute osseous abnormality identified. Chronic compression fracture in the upper lumbar spine. Negative visible bowel gas pattern. IMPRESSION: Stable chronic lung disease. No acute cardiopulmonary abnormality. Calcified aortic atherosclerosis. Electronically Signed   By: Genevie Ann M.D.   On: 05/07/2016 15:42       Assessment & Plan:   Problem List Items Addressed This Visit    Asthma, moderate persistent    Cough and congestion better.  Seeing pulmonary. Planning for PFTs.  Dr Annamaria Boots following cxr.       Atrial fibrillation ([place])    Rate controlled.  Sees cardiology.  EKG as outlined.  Refer back to cardiology as outlined.        Relevant Orders   Ambulatory referral to Cardiology   Chronic back pain    Limits her mobility.       Coronary atherosclerosis    Followed by cardiology.  Has noticed some increased sob with exertion - more recently.  Has been having issues with her lungs.  Has been treated.  Cough and congestion better.  Given symptoms and notice of increased heart rate at times, EKG obtained and revealed afib with controlled ventricular rate.  No acute ischemic changes.  Given persistent symptoms  - with improved cough and congestion, will have cardiology evaluate with question of need for further cardiac w/up. Pt in agreement.        Relevant Orders   Ambulatory referral to Cardiology   Diabetes mellitus ([place])    Low carb diet and exercise.  Follow met b and a1c.  Up to date with eye checks.  Relevant Orders   Hemoglobin A1c   Health care maintenance    Physical today 06/30/16.  Mammogram 03/11/16 - birads I.  Saw GI.  States did not want to pursue further GI evaluation.        Hypercholesterolemia    On crestor.  Low cholesterol diet and exercise.  Follow lipid panel and liver  function tests.        Relevant Orders   Hepatic function panel   Lipid panel   Hypertension    Blood pressure under good control.  Continue same medication regimen.  Follow pressures.  Follow metabolic panel.        Relevant Orders   TSH   Basic metabolic panel   Neuropathy    Discussed with her today.  Desires no further treatment.        Obesity    Diet and exercise.  She has adjusted her diet.  Has lost weight.  Follow.        Psoriatic arthritis ([place])    Followed by rheumatology.        Seizure disorder ([place])    Followed by neurology.  Has had no seizures recently.  Follow.        Stress    On zoloft.  Appears to be stable.  Follow.        Other Visit Diagnoses    SOB (shortness of breath)    -  Primary   Relevant Orders   EKG 12-Lead (Completed)   Ambulatory referral to Cardiology       Einar Pheasant, MD

## 2016-06-30 NOTE — Progress Notes (Signed)
Pre-visit discussion using our clinic review tool. No additional management support is needed unless otherwise documented below in the visit note.  

## 2016-06-30 NOTE — Assessment & Plan Note (Addendum)
Physical today 06/30/16.  Mammogram 03/11/16 - birads I.  Saw GI.  States did not want to pursue further GI evaluation.

## 2016-07-03 NOTE — Assessment & Plan Note (Signed)
Rate controlled.  Sees cardiology.  EKG as outlined.  Refer back to cardiology as outlined.

## 2016-07-03 NOTE — Assessment & Plan Note (Signed)
Low carb diet and exercise.  Follow met b and a1c.  Up to date with eye checks.

## 2016-07-03 NOTE — Assessment & Plan Note (Signed)
Followed by neurology.  Has had no seizures recently.  Follow.

## 2016-07-03 NOTE — Assessment & Plan Note (Addendum)
Cough and congestion better.  Seeing pulmonary. Planning for PFTs.  Dr Annamaria Boots following cxr.

## 2016-07-03 NOTE — Assessment & Plan Note (Signed)
Diet and exercise.  She has adjusted her diet.  Has lost weight.  Follow.

## 2016-07-03 NOTE — Assessment & Plan Note (Signed)
Blood pressure under good control.  Continue same medication regimen.  Follow pressures.  Follow metabolic panel.   

## 2016-07-03 NOTE — Assessment & Plan Note (Signed)
Discussed with her today.  Desires no further treatment.

## 2016-07-03 NOTE — Assessment & Plan Note (Signed)
Limits her mobility. 

## 2016-07-03 NOTE — Assessment & Plan Note (Signed)
Followed by cardiology.  Has noticed some increased sob with exertion - more recently.  Has been having issues with her lungs.  Has been treated.  Cough and congestion better.  Given symptoms and notice of increased heart rate at times, EKG obtained and revealed afib with controlled ventricular rate.  No acute ischemic changes.  Given persistent symptoms  - with improved cough and congestion, will have cardiology evaluate with question of need for further cardiac w/up. Pt in agreement.

## 2016-07-03 NOTE — Assessment & Plan Note (Signed)
On zoloft.  Appears to be stable.  Follow.  

## 2016-07-03 NOTE — Assessment & Plan Note (Signed)
Followed by rheumatology. 

## 2016-07-03 NOTE — Assessment & Plan Note (Signed)
On crestor.  Low cholesterol diet and exercise.  Follow lipid panel and liver function tests.   

## 2016-07-11 ENCOUNTER — Other Ambulatory Visit: Payer: Self-pay | Admitting: Internal Medicine

## 2016-07-13 DIAGNOSIS — Z9889 Other specified postprocedural states: Secondary | ICD-10-CM | POA: Diagnosis not present

## 2016-07-13 DIAGNOSIS — I2510 Atherosclerotic heart disease of native coronary artery without angina pectoris: Secondary | ICD-10-CM | POA: Diagnosis not present

## 2016-07-13 DIAGNOSIS — R0602 Shortness of breath: Secondary | ICD-10-CM | POA: Diagnosis not present

## 2016-07-13 DIAGNOSIS — Z7901 Long term (current) use of anticoagulants: Secondary | ICD-10-CM | POA: Diagnosis not present

## 2016-07-13 DIAGNOSIS — J449 Chronic obstructive pulmonary disease, unspecified: Secondary | ICD-10-CM | POA: Diagnosis not present

## 2016-07-13 DIAGNOSIS — I10 Essential (primary) hypertension: Secondary | ICD-10-CM | POA: Diagnosis not present

## 2016-07-13 DIAGNOSIS — E7800 Pure hypercholesterolemia, unspecified: Secondary | ICD-10-CM | POA: Diagnosis not present

## 2016-08-05 ENCOUNTER — Ambulatory Visit: Payer: Medicare PPO | Admitting: Nurse Practitioner

## 2016-08-12 DIAGNOSIS — X32XXXA Exposure to sunlight, initial encounter: Secondary | ICD-10-CM | POA: Diagnosis not present

## 2016-08-12 DIAGNOSIS — Z08 Encounter for follow-up examination after completed treatment for malignant neoplasm: Secondary | ICD-10-CM | POA: Diagnosis not present

## 2016-08-12 DIAGNOSIS — L400 Psoriasis vulgaris: Secondary | ICD-10-CM | POA: Diagnosis not present

## 2016-08-12 DIAGNOSIS — Z5181 Encounter for therapeutic drug level monitoring: Secondary | ICD-10-CM | POA: Diagnosis not present

## 2016-08-12 DIAGNOSIS — Z85828 Personal history of other malignant neoplasm of skin: Secondary | ICD-10-CM | POA: Diagnosis not present

## 2016-08-12 DIAGNOSIS — L570 Actinic keratosis: Secondary | ICD-10-CM | POA: Diagnosis not present

## 2016-08-17 DIAGNOSIS — I2510 Atherosclerotic heart disease of native coronary artery without angina pectoris: Secondary | ICD-10-CM | POA: Diagnosis not present

## 2016-08-17 DIAGNOSIS — R0602 Shortness of breath: Secondary | ICD-10-CM | POA: Diagnosis not present

## 2016-08-17 DIAGNOSIS — I10 Essential (primary) hypertension: Secondary | ICD-10-CM | POA: Diagnosis not present

## 2016-08-17 DIAGNOSIS — Z9889 Other specified postprocedural states: Secondary | ICD-10-CM | POA: Diagnosis not present

## 2016-08-24 DIAGNOSIS — I2510 Atherosclerotic heart disease of native coronary artery without angina pectoris: Secondary | ICD-10-CM | POA: Diagnosis not present

## 2016-08-24 DIAGNOSIS — E785 Hyperlipidemia, unspecified: Secondary | ICD-10-CM | POA: Diagnosis not present

## 2016-08-24 DIAGNOSIS — Z9889 Other specified postprocedural states: Secondary | ICD-10-CM | POA: Diagnosis not present

## 2016-08-24 DIAGNOSIS — Z7901 Long term (current) use of anticoagulants: Secondary | ICD-10-CM | POA: Diagnosis not present

## 2016-08-24 DIAGNOSIS — R0602 Shortness of breath: Secondary | ICD-10-CM | POA: Diagnosis not present

## 2016-08-24 DIAGNOSIS — I639 Cerebral infarction, unspecified: Secondary | ICD-10-CM | POA: Diagnosis not present

## 2016-08-24 DIAGNOSIS — J449 Chronic obstructive pulmonary disease, unspecified: Secondary | ICD-10-CM | POA: Diagnosis not present

## 2016-08-24 DIAGNOSIS — I10 Essential (primary) hypertension: Secondary | ICD-10-CM | POA: Diagnosis not present

## 2016-08-26 ENCOUNTER — Telehealth: Payer: Self-pay | Admitting: *Deleted

## 2016-08-26 ENCOUNTER — Other Ambulatory Visit: Payer: Self-pay | Admitting: Internal Medicine

## 2016-08-26 NOTE — Telephone Encounter (Signed)
Dr. Karle Barr from [facility] dermatology is calling about placing pt on methotrexate and her sz  medication , dilantin.  Please call mobile [phone].

## 2016-08-26 NOTE — Telephone Encounter (Signed)
I called and left a message, the patient is on Dilantin, it looks like she is also taking Pradaxa, Dilantin interacts with methotrexate and Pradaxa, we may need to get the patient off of this medication, she has been well controlled with her seizures, she will be seen in September 2018, we will consider a slow taper of Dilantin at that time and addition of low-dose Vimpat, working up to 50 mg twice daily.

## 2016-09-08 ENCOUNTER — Telehealth: Payer: Self-pay | Admitting: *Deleted

## 2016-09-08 MED ORDER — PHENYTOIN SODIUM EXTENDED 100 MG PO CAPS: 400.0000 mg | ORAL_CAPSULE | Freq: Every day | ORAL | 3 refills | Status: DC

## 2016-09-08 NOTE — Telephone Encounter (Signed)
I will refill the Dilantin for now, she eventually will need, the medication as this has an interaction with Pradaxa and with methotrexate.  This will need to be addressed on her next revisit.

## 2016-09-08 NOTE — Telephone Encounter (Signed)
Received refill request from [facility] for Phenytoin Sod Ext 100 mg caps.  Per Dr Tobey Grim note on 08/26/16 he was going to consider tapering patient off this medication due to possible medication interactions.  Will route this refill request to Dr Jannifer Franklin.

## 2016-09-14 ENCOUNTER — Other Ambulatory Visit: Payer: Self-pay | Admitting: Internal Medicine

## 2016-09-24 ENCOUNTER — Other Ambulatory Visit: Payer: Self-pay | Admitting: Internal Medicine

## 2016-09-24 ENCOUNTER — Telehealth: Payer: Self-pay | Admitting: Internal Medicine

## 2016-09-24 MED ORDER — PREDNISONE 10 MG PO TABS
ORAL_TABLET | ORAL | 0 refills | Status: DC
Start: 2016-09-24 — End: 2016-10-30

## 2016-09-24 NOTE — Telephone Encounter (Signed)
Offer prednisone 10 mg, # 20, 4 X 2 DAYS, 3 X 2 DAYS, 2 X 2 DAYS, 1 X 2 DAYS I hope her husband does ok.

## 2016-09-24 NOTE — Telephone Encounter (Signed)
Spoke with the pt and notified of recs per CDY  She verbalized understanding  Rx was sent to pharm  

## 2016-09-24 NOTE — Telephone Encounter (Signed)
Patient is returning phone call.  °

## 2016-09-24 NOTE — Telephone Encounter (Signed)
lmtcb x1 for pt. 

## 2016-09-24 NOTE — Telephone Encounter (Signed)
Spoke with pt, she states asthma is acting up with asthma, wheezing, and cough. Her husband is at the hospital in [place] and she is unable to drive. Denies fever and the symptoms started 2 nights ago. Her cough is productive with yellow/white mucus and she blew some blood from her nose but states it was not a lot. Please advise  CY

## 2016-09-27 NOTE — Progress Notes (Deleted)
[facility]  PATIENT: Brooke Baker DOB: [birth year]   REASON FOR VISIT: Follow-up for seizure disorder HISTORY FROM: Patient and husband    HISTORY OF PRESENT ILLNESS:Ms. Nihiser is a 82 year old right-handed white female with a history of seizures. The patient has not had any seizures in a number of years,  Last  2004. The patient indicates that since she went on a combination of Dilantin and Keppra, the seizures have essentially disappeared. The patient usually operates a motor vehicle, but she has been unable to drive since she fractured her femur, and she still requires a walker for ambulation. She has not had any further falls. She is on a combination of calcium and vitamin D supplementation currently. She is tolerating the medications fairly well, and she recently had blood work done in March 2017 CBC and CMP  through her primary care physician, but a dilantin level was not included. She returns for an evaluation at this time. She needs refills on her medications.  REVIEW OF SYSTEMS: Full 14 system review of systems performed and notable only for those listed, all others are neg:  Constitutional: neg  Cardiovascular: Leg swelling Ear/Nose/Throat: neg  Skin: neg Eyes: neg Respiratory: Wheezing shortness of breath Gastroitestinal:  Hematology/Lymphatic: neg  Endocrine: neg Musculoskeletal:neg Allergy/Immunology: neg Neurological: neg Psychiatric: neg Sleep : neg   ALLERGIES: Allergies  Allergen Reactions  . Doxycycline Diarrhea and Nausea Only  . Methotrexate Rash    Mouth broke out    HOME MEDICATIONS: Outpatient Medications Prior to Visit  Medication Sig Dispense Refill  . albuterol (PROVENTIL) (2.5 MG/3ML) 0.083% nebulizer solution USE ONE VIAL VIA NEBULZIER EVERY FOUR HOURS AS NEEDED FOR WHEEZING 180 vial 0  . amLODipine (NORVASC) 10 MG tablet TAKE ONE (1) TABLET EACH DAY 30 tablet 5  . aspirin 81 MG tablet Take 81 mg by mouth daily.        . dabigatran (PRADAXA) 150 MG CAPS Take 150 mg by mouth every 12 (twelve) hours.    [name] EPINEPHrine (EPIPEN) 0.3 mg/0.3 mL DEVI Inject 0.3 mLs (0.3 mg total) into the muscle once. 1 Device prn  . fluticasone (FLONASE) 50 MCG/ACT nasal spray USE TWO SPRAYS IN EACH NOSTRIL EACH DAY AS DIRECTED BY PHYSICIAN 16 g 3  . hydrochlorothiazide (HYDRODIURIL) 25 MG tablet TAKE ONE (1) TABLET EACH DAY 30 tablet 1  . levETIRAcetam (KEPPRA) 750 MG tablet TAKE ONE TABLET TWICE DAILY 60 tablet 1  . losartan (COZAAR) 100 MG tablet TAKE ONE (1) TABLET EACH DAY 30 tablet 5  . metoprolol succinate (TOPROL-XL) 50 MG 24 hr tablet Take 1 tablet (50 mg total) by mouth at bedtime. Take with or immediately following a meal. 30 tablet 0  . metoprolol succinate (TOPROL-XL) 50 MG 24 hr tablet Take 1 tablet (50 mg total) by mouth at bedtime. Take with or immediately following a meal. 90 tablet 3  . NONFORMULARY OR COMPOUNDED ITEM Allergy Vaccine 1:10 Given at Home    . Omega-3 Fatty Acids (FISH OIL) 1000 MG CAPS Take 1 capsule by mouth 2 (two) times daily.      . phenytoin (DILANTIN) 100 MG ER capsule Take 4 capsules (400 mg total) by mouth at bedtime. 120 capsule 3  . predniSONE (DELTASONE) 10 MG tablet 4 x 2 days, 3 x 2 days, 2 x 2 days, 1 x 2 days, then stop 20 tablet 0  . rosuvastatin (CRESTOR) 20 MG tablet TAKE ONE (1) TABLET EACH DAY 30 tablet 5  . sertraline (  ZOLOFT) 50 MG tablet TAKE 1 AND ONE-HALF TABLETS BY MOUTH ONCE DAILY 45 tablet 1  . SYMBICORT 80-4.5 MCG/ACT inhaler INHALE 2 PUFFS TWICE A DAY 10.2 Inhaler 5  . Tiotropium Bromide Monohydrate (SPIRIVA RESPIMAT) 2.5 MCG/ACT AERS Inhale 2 puffs into the lungs daily. 1 Inhaler 0  . VENTOLIN HFA 108 (90 Base) MCG/ACT inhaler USE 2 PUFFS EVERY 4 HOURS AS DIRECTED - RESCUE 18 Inhaler 5   No facility-administered medications prior to visit.     PAST MEDICAL HISTORY: Past Medical History:  Diagnosis Date  . Acute bronchitis   . Allergic rhinitis   . Arthritis   .  CAD (coronary artery disease)    s/p stent LAD 8/03 and stent placement OM1  . Cancer ([place])    skin  . Chronic back pain   . COPD with asthma ([place])   . CVA (cerebral vascular accident) ([place])   . Diabetes mellitus ([place])   . Hypercholesterolemia   . Hypertension   . Seizure disorder ([place])     PAST SURGICAL HISTORY: Past Surgical History:  Procedure Laterality Date  . BREAST BIOPSY Bilateral    axillas  . CORONARY STENT PLACEMENT  8/03   LAD  . CORONARY STENT PLACEMENT     OM1  2006  . FEMUR FRACTURE SURGERY Right [id number]   Dr. Marry Guan  . LUMBAR SPINE SURGERY     x3  . TOTAL ABDOMINAL HYSTERECTOMY     appendectomy - age 89  . TOTAL HIP ARTHROPLASTY    . TOTAL KNEE ARTHROPLASTY     2005    FAMILY HISTORY: Family History  Problem Relation Age of Onset  . Allergies Brother   . Cancer Brother   . Allergies Sister   . Cancer Sister        Non-hodgkins lymphoma  . Asthma Sister   . Rheum arthritis Sister   . Stroke Mother   . Asthma Brother   . Rheum arthritis Brother   . Cancer Sister        non-hodgkins lymphoma  . Diabetes Brother     SOCIAL HISTORY: Social History   Social History  . Marital status: Married    Spouse name: N/A  . Number of children: 3  . Years of education: hs   Occupational History  . 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 Smoking status: Never Smoker  . Smokeless tobacco: Never Used  . Alcohol use No  . Drug use: No  . Sexual activity: No   Other Topics Concern  . Not on file   Social History Narrative  . No narrative on file     PHYSICAL EXAM  There were no vitals filed for this visit. There is no height or weight on file to calculate BMI.  Generalized: Well developed, morbidly obese female in no acute distress  Head: normocephalic and atraumatic,. Oropharynx benign  Neck: Supple, no carotid bruits  Musculoskeletal: No deformity  Skin 1-2+ edema below the knees bilaterally  Neurological  examination   Mentation: Alert oriented to time, place, history taking. Attention span and concentration appropriate. Recent and remote memory intact.  Follows all commands speech and language fluent.   Cranial nerve II-XIPupils were equal round reactive to light extraocular movements were full, visual field were full on confrontational test. Facial sensation and strength were normal. hearing was intact to finger rubbing bilaterally. Uvula tongue midline. head turning and shoulder shrug were normal and symmetric.Tongue protrusion  into cheek strength was normal. Motor: normal bulk and tone, full strength in the BUE, BLE, fine finger movements normal, no pronator drift. No focal weakness Coordination: finger-nose-finger, performed well has problems with heel-to-shin Reflexes: Brachioradialis 2/2, biceps 2/2, triceps 2/2, patellar 2/2, Achilles 2/2, plantar responses were flexor bilaterally. Gait and Station: Rising up from seated position without assistance, wide based stance, walks with a walker no difficulty with turns, tandem gait not attempted, Romberg is negative DIAGNOSTIC DATA (LABS, IMAGING, TESTING) - ASSESSMENT AND PLAN  82 y.o. year old female  has a past medical history of seizure disorder well-controlled on Dilantin and Keppra. Last seizure event 2004  Pt to continue Keppra at current dose, will refill Continue Dilantin at current dose, will refill Will check Dilantin level, reviewed recent CBC and CMP from March 2017 Call for seizure activity F/U in 1 year Dennie Bible, [facility], [facility], [facility] [address], [place], [place] [address] [phone]

## 2016-09-28 ENCOUNTER — Ambulatory Visit: Payer: Medicare PPO | Admitting: Nurse Practitioner

## 2016-09-29 ENCOUNTER — Encounter: Payer: Self-pay | Admitting: Nurse Practitioner

## 2016-10-07 ENCOUNTER — Other Ambulatory Visit: Payer: Self-pay | Admitting: Internal Medicine

## 2016-10-26 ENCOUNTER — Other Ambulatory Visit: Payer: Self-pay | Admitting: Internal Medicine

## 2016-10-27 ENCOUNTER — Other Ambulatory Visit: Payer: Medicare PPO

## 2016-10-27 DIAGNOSIS — I2510 Atherosclerotic heart disease of native coronary artery without angina pectoris: Secondary | ICD-10-CM | POA: Diagnosis not present

## 2016-10-27 DIAGNOSIS — J449 Chronic obstructive pulmonary disease, unspecified: Secondary | ICD-10-CM | POA: Diagnosis not present

## 2016-10-27 DIAGNOSIS — E785 Hyperlipidemia, unspecified: Secondary | ICD-10-CM | POA: Diagnosis not present

## 2016-10-27 DIAGNOSIS — I10 Essential (primary) hypertension: Secondary | ICD-10-CM | POA: Diagnosis not present

## 2016-10-27 DIAGNOSIS — R0602 Shortness of breath: Secondary | ICD-10-CM | POA: Diagnosis not present

## 2016-10-27 DIAGNOSIS — Z9889 Other specified postprocedural states: Secondary | ICD-10-CM | POA: Diagnosis not present

## 2016-10-27 DIAGNOSIS — I639 Cerebral infarction, unspecified: Secondary | ICD-10-CM | POA: Diagnosis not present

## 2016-10-27 DIAGNOSIS — I480 Paroxysmal atrial fibrillation: Secondary | ICD-10-CM | POA: Diagnosis not present

## 2016-10-30 ENCOUNTER — Encounter: Payer: Self-pay | Admitting: Internal Medicine

## 2016-10-30 ENCOUNTER — Ambulatory Visit (INDEPENDENT_AMBULATORY_CARE_PROVIDER_SITE_OTHER): Payer: Medicare PPO | Admitting: Internal Medicine

## 2016-10-30 DIAGNOSIS — G40909 Epilepsy, unspecified, not intractable, without status epilepticus: Secondary | ICD-10-CM

## 2016-10-30 DIAGNOSIS — E1140 Type 2 diabetes mellitus with diabetic neuropathy, unspecified: Secondary | ICD-10-CM

## 2016-10-30 DIAGNOSIS — E7800 Pure hypercholesterolemia, unspecified: Secondary | ICD-10-CM | POA: Diagnosis not present

## 2016-10-30 DIAGNOSIS — I10 Essential (primary) hypertension: Secondary | ICD-10-CM | POA: Diagnosis not present

## 2016-10-30 DIAGNOSIS — Z6839 Body mass index (BMI) 39.0-39.9, adult: Secondary | ICD-10-CM | POA: Diagnosis not present

## 2016-10-30 DIAGNOSIS — F439 Reaction to severe stress, unspecified: Secondary | ICD-10-CM | POA: Diagnosis not present

## 2016-10-30 DIAGNOSIS — J4541 Moderate persistent asthma with (acute) exacerbation: Secondary | ICD-10-CM

## 2016-10-30 DIAGNOSIS — I2510 Atherosclerotic heart disease of native coronary artery without angina pectoris: Secondary | ICD-10-CM | POA: Diagnosis not present

## 2016-10-30 DIAGNOSIS — L4050 Arthropathic psoriasis, unspecified: Secondary | ICD-10-CM | POA: Diagnosis not present

## 2016-10-30 DIAGNOSIS — I482 Chronic atrial fibrillation, unspecified: Secondary | ICD-10-CM

## 2016-10-30 LAB — HM DIABETES FOOT EXAM

## 2016-10-30 MED ORDER — PREDNISONE 10 MG PO TABS: ORAL_TABLET | ORAL | 0 refills | Status: DC

## 2016-10-30 NOTE — Progress Notes (Signed)
Patient ID: Brooke Baker, female   DOB: 1934-03-12, 82 y.o.   MRN: [id number]   Subjective:    Patient ID: Brooke Baker, female    DOB: 1934-03-02, 82 y.o.   MRN: [id number]  HPI  Patient here for a scheduled follow up.  She reports she has been under increased stress.  Discussed with her today.  She has good support.  Does not feel needs anything more at this time.  Recent myoview - negative for ischemia.  Remains on predaxa.  Recently evaluated for increased heart rate.  Cardiology increased her metoprolol to bid.  Wore a monitor.  No chest pain.  No sob.  No acid reflux.  No abdominal pain.  Does report increased nasal congestion and increased cough.  Has known asthma and allergic rhinitis.  Eating and drinking.  No nausea or vomiting.  No diarrhea.     Past Medical History:  Diagnosis Date  . Acute bronchitis   . Allergic rhinitis   . Arthritis   . CAD (coronary artery disease)    s/p stent LAD 8/03 and stent placement OM1  . Cancer ([place])    skin  . Chronic back pain   . COPD with asthma ([place])   . CVA (cerebral vascular accident) ([place])   . Diabetes mellitus ([place])   . Hypercholesterolemia   . Hypertension   . Seizure disorder [facility])    Past Surgical History:  Procedure Laterality Date  . BREAST BIOPSY Bilateral    axillas  . CORONARY STENT PLACEMENT  8/03   LAD  . CORONARY STENT PLACEMENT     OM1  2006  . FEMUR FRACTURE SURGERY Right [id number]   Dr. Marry Guan  . LUMBAR SPINE SURGERY     x3  . TOTAL ABDOMINAL HYSTERECTOMY     appendectomy - age 63  . TOTAL HIP ARTHROPLASTY    . TOTAL KNEE ARTHROPLASTY     2005   Family History  Problem Relation Age of Onset  . Allergies Brother   . Cancer Brother   . Allergies Sister   . Cancer Sister        Non-hodgkins lymphoma  . Asthma Sister   . Rheum arthritis Sister   . Stroke Mother   . Asthma Brother   . Rheum arthritis Brother   . Cancer Sister        non-hodgkins lymphoma  . Diabetes Brother    Social History     Social History  . Marital status: Married    Spouse name: N/A  . Number of children: 3  . Years of education: hs   Occupational History  . 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 Smoking status: Never Smoker  . Smokeless tobacco: Never Used  . Alcohol use No  . Drug use: No  . Sexual activity: No   Other Topics Concern  . None   Social History Narrative  . None    Outpatient Encounter Prescriptions as of 10/30/2016  Medication Sig  . albuterol (PROVENTIL) (2.5 MG/3ML) 0.083% nebulizer solution USE ONE VIAL VIA NEBULZIER EVERY FOUR HOURS AS NEEDED FOR WHEEZING  . amLODipine (NORVASC) 10 MG tablet TAKE ONE (1) TABLET EACH DAY  . aspirin 81 MG tablet Take 81 mg by mouth daily.    . dabigatran (PRADAXA) 150 MG CAPS Take 150 mg by mouth every 12 (twelve) hours.  [name] EPINEPHrine (EPIPEN) 0.3 mg/0.3 mL DEVI Inject 0.3 mLs (0.3 mg total)  into the muscle once.  . fluticasone (FLONASE) 50 MCG/ACT nasal spray USE TWO SPRAYS IN EACH NOSTRIL EACH DAY AS DIRECTED BY PHYSICIAN  . hydrochlorothiazide (HYDRODIURIL) 25 MG tablet TAKE ONE (1) TABLET EACH DAY  . levETIRAcetam (KEPPRA) 750 MG tablet TAKE ONE TABLET TWICE DAILY  . losartan (COZAAR) 100 MG tablet TAKE ONE (1) TABLET EACH DAY  . metoprolol succinate (TOPROL-XL) 50 MG 24 hr tablet Take 1 tablet (50 mg total) by mouth at bedtime. Take with or immediately following a meal. (Patient taking differently: Take 50 mg by mouth 2 (two) times daily. Take with or immediately following a meal.)  . NONFORMULARY OR COMPOUNDED ITEM Allergy Vaccine 1:10 Given at Home  . Omega-3 Fatty Acids (FISH OIL) 1000 MG CAPS Take 1 capsule by mouth 2 (two) times daily.    . phenytoin (DILANTIN) 100 MG ER capsule Take 4 capsules (400 mg total) by mouth at bedtime.  . predniSONE (DELTASONE) 10 MG tablet 4 x 2 days, 3 x 2 days, 2 x 2 days, 1 x 2 days, then stop  . rosuvastatin (CRESTOR) 20 MG tablet TAKE ONE (1) TABLET EACH DAY  .  sertraline (ZOLOFT) 50 MG tablet TAKE 1 AND ONE-HALF TABLETS BY MOUTH ONCE DAILY  . SYMBICORT 80-4.5 MCG/ACT inhaler INHALE 2 PUFFS TWICE A DAY  . Tiotropium Bromide Monohydrate (SPIRIVA RESPIMAT) 2.5 MCG/ACT AERS Inhale 2 puffs into the lungs daily.  . VENTOLIN HFA 108 (90 Base) MCG/ACT inhaler USE 2 PUFFS EVERY 4 HOURS AS DIRECTED - RESCUE  . [DISCONTINUED] metoprolol succinate (TOPROL-XL) 50 MG 24 hr tablet Take 1 tablet (50 mg total) by mouth at bedtime. Take with or immediately following a meal.  . [DISCONTINUED] predniSONE (DELTASONE) 10 MG tablet 4 x 2 days, 3 x 2 days, 2 x 2 days, 1 x 2 days, then stop   No facility-administered encounter medications on file as of 10/30/2016.     Review of Systems  Constitutional: Negative for appetite change and unexpected weight change.  HENT: Positive for congestion and postnasal drip.   Respiratory: Positive for cough. Negative for chest tightness and shortness of breath.   Cardiovascular: Negative for chest pain and palpitations.  Gastrointestinal: Negative for abdominal pain, diarrhea, nausea and vomiting.  Genitourinary: Negative for difficulty urinating and dysuria.  Musculoskeletal: Negative for joint swelling and myalgias.  Skin: Negative for color change and rash.  Neurological: Negative for dizziness, light-headedness and headaches.  Psychiatric/Behavioral: Negative for dysphoric mood.       Increased stress.         Objective:    Physical Exam  Constitutional: She appears well-developed and well-nourished. No distress.  HENT:  Mouth/Throat: Oropharynx is clear and moist.  Nares:  Slightly erythematous turbinates.    Neck: Neck supple. No thyromegaly present.  Cardiovascular: Normal rate and regular rhythm.   Pulmonary/Chest: Breath sounds normal. No respiratory distress. She has no wheezes.  Some increased cough with forced expiration.   Abdominal: Soft. Bowel sounds are normal. There is no tenderness.  Musculoskeletal: She  exhibits no tenderness.  No increased edema.  Feet:  No lesion noted.  Intact to light touch and vibration.     Lymphadenopathy:    She has no cervical adenopathy.  Skin: No rash noted. No erythema.  Psychiatric: She has a normal mood and affect. Her behavior is normal.    BP 126/80 (BP Location: Left Arm, Patient Position: Sitting, Cuff Size: Large)   Pulse 62   Temp 98.6 F (37 C) (  Oral)   Resp 14   Ht '5\' 6"'$  (1.676 m)   Wt 240 lb (108.9 kg)   LMP 08/17/1965   SpO2 96%   BMI 38.74 kg/m  Wt Readings from Last 3 Encounters:  10/30/16 240 lb (108.9 kg)  06/30/16 236 lb 9.6 oz (107.3 kg)  05/07/16 238 lb 3.2 oz (108 kg)     Lab Results  Component Value Date   WBC 5.6 06/26/2016   HGB 12.3 06/26/2016   HCT 36.1 06/26/2016   PLT 158.0 06/26/2016   GLUCOSE 117 (H) 06/26/2016   CHOL 114 06/26/2016   TRIG 73.0 06/26/2016   HDL 45.90 06/26/2016   LDLCALC 54 06/26/2016   ALT 17 06/26/2016   AST 29 06/26/2016   NA 135 06/26/2016   K 4.8 06/26/2016   CL 105 06/26/2016   CREATININE 1.03 06/26/2016   BUN 32 (H) 06/26/2016   CO2 24 06/26/2016   TSH 2.77 09/13/2015   INR 1.3 05/12/2012   HGBA1C 5.7 06/26/2016   MICROALBUR 0.8 06/26/2016    Dg Chest 2 View  Result Date: 05/07/2016 CLINICAL DATA:  82 year old female with chronic shortness of breath. EXAM: CHEST  2 VIEW COMPARISON:  01/29/2016 and earlier. FINDINGS: Chronic mild to moderate pulmonary hyperinflation suspected. Chronic bilateral increased interstitial opacity. No pneumothorax, pulmonary edema, pleural effusion or acute pulmonary opacity. Cardiac size is at the upper limits of normal. Calcified aortic atherosclerosis. Other mediastinal contours are within normal limits. Visualized tracheal air column is within normal limits. Osteopenia. No acute osseous abnormality identified. Chronic compression fracture in the upper lumbar spine. Negative visible bowel gas pattern. IMPRESSION: Stable chronic lung disease. No acute  cardiopulmonary abnormality. Calcified aortic atherosclerosis. Electronically Signed   By: Genevie Ann M.D.   On: 05/07/2016 15:42       Assessment & Plan:   Problem List Items Addressed This Visit    Asthma, moderate persistent    Followed by pulmonary.  Congestion and cough as outlined.  Prednisone taper as directed.  Continue inhalers.  Follow.        Relevant Medications   predniSONE (DELTASONE) 10 MG tablet   Atrial fibrillation (HCC)    Rate controlled now.  On increased metoprolol.  Followed by cardiology.  Continue pradaxa.        Coronary atherosclerosis    Followed by cardiology.  Recent stress test - negative.  No chest pain.  Continue risk factor modification.        Diabetes mellitus ([place])    Low carb diet and exercise.  Follow met b and a1c.  Keep up to date with eye checks.        Hypercholesterolemia    On crestor.  Low cholesterol diet and exercise.  Follow lipid panel and liver function tests.        Hypertension    Blood pressure under good control.  Continue same medication regimen.  Follow pressures.  Follow metabolic panel.        Obesity    Discussed diet and exercise.  Follow.        Psoriatic arthritis ([place])    Followed by rheumatology.        Seizure disorder ([place])    Followed by neurology.  No seizures recently.        Stress    On zoloft.  Increased stress.  Discussed with her today.  She has good support.  Does not feel needs anything more at this time.  Follow.  Einar Pheasant, MD

## 2016-11-01 ENCOUNTER — Encounter: Payer: Self-pay | Admitting: Internal Medicine

## 2016-11-01 NOTE — Assessment & Plan Note (Signed)
Blood pressure under good control.  Continue same medication regimen.  Follow pressures.  Follow metabolic panel.   

## 2016-11-01 NOTE — Assessment & Plan Note (Signed)
Low carb diet and exercise.  Follow met b and a1c.  Keep up to date with eye checks.

## 2016-11-01 NOTE — Assessment & Plan Note (Signed)
Discussed diet and exercise.  Follow.  

## 2016-11-01 NOTE — Assessment & Plan Note (Signed)
Rate controlled now.  On increased metoprolol.  Followed by cardiology.  Continue pradaxa.

## 2016-11-01 NOTE — Assessment & Plan Note (Signed)
Followed by cardiology.  Recent stress test - negative.  No chest pain.  Continue risk factor modification.

## 2016-11-01 NOTE — Assessment & Plan Note (Signed)
On crestor.  Low cholesterol diet and exercise.  Follow lipid panel and liver function tests.   

## 2016-11-01 NOTE — Assessment & Plan Note (Signed)
Followed by rheumatology. 

## 2016-11-01 NOTE — Assessment & Plan Note (Signed)
Followed by pulmonary.  Congestion and cough as outlined.  Prednisone taper as directed.  Continue inhalers.  Follow.

## 2016-11-01 NOTE — Assessment & Plan Note (Signed)
Followed by neurology.  No seizures recently.

## 2016-11-01 NOTE — Assessment & Plan Note (Signed)
On zoloft.  Increased stress.  Discussed with her today.  She has good support.  Does not feel needs anything more at this time.  Follow.

## 2016-11-03 DIAGNOSIS — I481 Persistent atrial fibrillation: Secondary | ICD-10-CM | POA: Diagnosis not present

## 2016-11-05 ENCOUNTER — Other Ambulatory Visit: Payer: Self-pay | Admitting: Internal Medicine

## 2016-11-09 ENCOUNTER — Telehealth: Payer: Self-pay | Admitting: Internal Medicine

## 2016-11-09 MED ORDER — AMOXICILLIN 500 MG PO TABS: 500.0000 mg | ORAL_TABLET | Freq: Two times a day (BID) | ORAL | 0 refills | Status: DC

## 2016-11-09 NOTE — Telephone Encounter (Signed)
Spoke with pt's spouse, Brooke Baker, who states pt is experiencing increased sob, prod cough with green mucus & wheezing x2w PCP prescribed prednisone on 10/30/16. Pt completed prednisone course with no improvement.  Denies fever, chills or sweats.  CY please advise.   Current Outpatient Medications on File Prior to Visit  Medication Sig Dispense Refill  . albuterol (PROVENTIL) (2.5 MG/3ML) 0.083% nebulizer solution USE ONE VIAL VIA NEBULZIER EVERY FOUR HOURS AS NEEDED FOR WHEEZING 180 vial 0  . amLODipine (NORVASC) 10 MG tablet TAKE ONE (1) TABLET EACH DAY 30 tablet 3  . aspirin 81 MG tablet Take 81 mg by mouth daily.      . dabigatran (PRADAXA) 150 MG CAPS Take 150 mg by mouth every 12 (twelve) hours.    [name] EPINEPHrine (EPIPEN) 0.3 mg/0.3 mL DEVI Inject 0.3 mLs (0.3 mg total) into the muscle once. 1 Device prn  . fluticasone (FLONASE) 50 MCG/ACT nasal spray USE TWO SPRAYS IN EACH NOSTRIL EACH DAY AS DIRECTED BY PHYSICIAN 16 g 3  . hydrochlorothiazide (HYDRODIURIL) 25 MG tablet TAKE ONE (1) TABLET EACH DAY 30 tablet 1  . levETIRAcetam (KEPPRA) 750 MG tablet TAKE ONE TABLET TWICE DAILY 60 tablet 1  . losartan (COZAAR) 100 MG tablet TAKE ONE (1) TABLET EACH DAY 30 tablet 1  . metoprolol succinate (TOPROL-XL) 50 MG 24 hr tablet Take 1 tablet (50 mg total) by mouth at bedtime. Take with or immediately following a meal. (Patient taking differently: Take 50 mg by mouth 2 (two) times daily. Take with or immediately following a meal.) 30 tablet 0  . NONFORMULARY OR COMPOUNDED ITEM Allergy Vaccine 1:10 Given at Home    . Omega-3 Fatty Acids (FISH OIL) 1000 MG CAPS Take 1 capsule by mouth 2 (two) times daily.      . phenytoin (DILANTIN) 100 MG ER capsule Take 4 capsules (400 mg total) by mouth at bedtime. 120 capsule 3  . predniSONE (DELTASONE) 10 MG tablet 4 x 2 days, 3 x 2 days, 2 x 2 days, 1 x 2 days, then stop 20 tablet 0  . rosuvastatin (CRESTOR) 20 MG tablet TAKE ONE (1) TABLET EACH DAY 30 tablet 3  .  sertraline (ZOLOFT) 50 MG tablet TAKE 1 AND ONE-HALF TABLETS BY MOUTH ONCE DAILY 45 tablet 1  . SYMBICORT 80-4.5 MCG/ACT inhaler INHALE 2 PUFFS TWICE A DAY 10.2 Inhaler 5  . Tiotropium Bromide Monohydrate (SPIRIVA RESPIMAT) 2.5 MCG/ACT AERS Inhale 2 puffs into the lungs daily. 1 Inhaler 0  . VENTOLIN HFA 108 (90 Base) MCG/ACT inhaler USE 2 PUFFS EVERY 4 HOURS AS DIRECTED - RESCUE 18 Inhaler 5   No current facility-administered medications on file prior to visit.     Allergies  Allergen Reactions  . Doxycycline Diarrhea and Nausea Only  . Methotrexate Rash    Mouth broke out

## 2016-11-09 NOTE — Telephone Encounter (Signed)
Offer amoxacillin 500 mg, # 14, 1 twice daily  Suggest Mucinex-DM

## 2016-11-09 NOTE — Telephone Encounter (Signed)
Spoke with patient's spouse about CY's recommendations. He verbalized understanding.   Medication has been called into the preferred pharmacy. Nothing else needed at time of call.

## 2016-11-11 ENCOUNTER — Encounter: Payer: Self-pay | Admitting: Pulmonary Disease

## 2016-11-11 ENCOUNTER — Ambulatory Visit: Payer: Medicare PPO | Admitting: Pulmonary Disease

## 2016-11-11 ENCOUNTER — Ambulatory Visit (INDEPENDENT_AMBULATORY_CARE_PROVIDER_SITE_OTHER)
Admission: RE | Admit: 2016-11-11 | Discharge: 2016-11-11 | Disposition: A | Discharge status: Home / Self Care | Payer: Medicare PPO | Source: Ambulatory Visit | Attending: Pulmonary Disease | Admitting: Pulmonary Disease

## 2016-11-11 VITALS — BP 124/78 | HR 81 | Ht 66.0 in | Wt 233.0 lb

## 2016-11-11 DIAGNOSIS — R059 Cough, unspecified: Secondary | ICD-10-CM

## 2016-11-11 DIAGNOSIS — R05 Cough: Secondary | ICD-10-CM

## 2016-11-11 LAB — NITRIC OXIDE: Nitric Oxide: 25

## 2016-11-11 MED ORDER — PREDNISONE 10 MG PO TABS: ORAL_TABLET | ORAL | 0 refills | Status: DC

## 2016-11-11 NOTE — Patient Instructions (Addendum)
We will get a chest x-ray today Continue the amoxicillin for the whole course. We will give a prednisone taper starting at 40 mg.  Reduce dose by 10 mg every 3 days. Follow-up with Dr. Annamaria Boots.  In 3 months

## 2016-11-11 NOTE — Progress Notes (Signed)
Brooke Baker    [id number]    October 24, 1934  Primary Care Physician:[name], Brooke Patient, MD  Referring Physician: Einar Baker, [address] [place], [place] [address]  Chief complaint: Acute visit for dyspnea, cough  HPI: 82 year old with moderate persistent asthma, recurrent bronchitis, allergic rhinitis Complains of productive cough with yellow mucus, wheezing chest discomfort and increased shortness of breath for 10 days.  She called the office on 11/5 and was started on amoxicillin which she is still taking. She has not noted any change in symptoms so far.  Outpatient Encounter Medications as of 11/11/2016  Medication Sig  . albuterol (PROVENTIL) (2.5 MG/3ML) 0.083% nebulizer solution USE ONE VIAL VIA NEBULZIER EVERY FOUR HOURS AS NEEDED FOR WHEEZING  . amLODipine (NORVASC) 10 MG tablet TAKE ONE (1) TABLET EACH DAY  . amoxicillin (AMOXIL) 500 MG tablet Take 1 tablet (500 mg total) 2 (two) times daily by mouth.  [name] aspirin 81 MG tablet Take 81 mg by mouth daily.    . dabigatran (PRADAXA) 150 MG CAPS Take 150 mg by mouth every 12 (twelve) hours.  [name] EPINEPHrine (EPIPEN) 0.3 mg/0.3 mL DEVI Inject 0.3 mLs (0.3 mg total) into the muscle once.  . fluticasone (FLONASE) 50 MCG/ACT nasal spray USE TWO SPRAYS IN EACH NOSTRIL EACH DAY AS DIRECTED BY PHYSICIAN  . hydrochlorothiazide (HYDRODIURIL) 25 MG tablet TAKE ONE (1) TABLET EACH DAY  . levETIRAcetam (KEPPRA) 750 MG tablet TAKE ONE TABLET TWICE DAILY  . losartan (COZAAR) 100 MG tablet TAKE ONE (1) TABLET EACH DAY  . metoprolol succinate (TOPROL-XL) 50 MG 24 hr tablet Take 1 tablet (50 mg total) by mouth at bedtime. Take with or immediately following a meal. (Baker taking differently: Take 50 mg by mouth 2 (two) times daily. Take with or immediately following a meal.)  . NONFORMULARY OR COMPOUNDED ITEM Allergy Vaccine 1:10 Given at Home  . Omega-3 Fatty Acids (FISH OIL) 1000 MG CAPS Take 1 capsule by  mouth 2 (two) times daily.    . phenytoin (DILANTIN) 100 MG ER capsule Take 4 capsules (400 mg total) by mouth at bedtime.  . rosuvastatin (CRESTOR) 20 MG tablet TAKE ONE (1) TABLET EACH DAY  . sertraline (ZOLOFT) 50 MG tablet TAKE 1 AND ONE-HALF TABLETS BY MOUTH ONCE DAILY  . SYMBICORT 80-4.5 MCG/ACT inhaler INHALE 2 PUFFS TWICE A DAY  . Tiotropium Bromide Monohydrate (SPIRIVA RESPIMAT) 2.5 MCG/ACT AERS Inhale 2 puffs into the lungs daily.  . VENTOLIN HFA 108 (90 Base) MCG/ACT inhaler USE 2 PUFFS EVERY 4 HOURS AS DIRECTED - RESCUE  . [DISCONTINUED] predniSONE (DELTASONE) 10 MG tablet 4 x 2 days, 3 x 2 days, 2 x 2 days, 1 x 2 days, then stop   No facility-administered encounter medications on file as of 11/11/2016.     Allergies as of 11/11/2016 - Review Complete 11/11/2016  Allergen Reaction Noted  . Doxycycline Diarrhea and Nausea Only 12/28/2013  . Methotrexate Rash 05/07/2015    Past Medical History:  Diagnosis Date  . Acute bronchitis   . Allergic rhinitis   . Arthritis   . CAD (coronary artery disease)    s/p stent LAD 8/03 and stent placement OM1  . Cancer ([place])    skin  . Chronic back pain   . COPD with asthma ([place])   . CVA (cerebral vascular accident) ([place])   . Diabetes mellitus ([place])   . Hypercholesterolemia   . Hypertension   . Seizure disorder ([place])  Past Surgical History:  Procedure Laterality Date  . BREAST BIOPSY Bilateral    axillas  . CORONARY STENT PLACEMENT  8/03   LAD  . CORONARY STENT PLACEMENT     OM1  2006  . FEMUR FRACTURE SURGERY Right [id number]   Dr. Marry Guan  . LUMBAR SPINE SURGERY     x3  . TOTAL ABDOMINAL HYSTERECTOMY     appendectomy - age 63  . TOTAL HIP ARTHROPLASTY    . TOTAL KNEE ARTHROPLASTY     2005    Family History  Problem Relation Age of Onset  . Allergies Brother   . Cancer Brother   . Allergies Sister   . Cancer Sister        Non-hodgkins lymphoma  . Asthma Sister   . Rheum arthritis Sister   . Stroke Mother   .  Asthma Brother   . Rheum arthritis Brother   . Cancer Sister        non-hodgkins lymphoma  . Diabetes Brother     Social History   Socioeconomic History  . Marital status: Married    Spouse name: Not on file  . Number of children: 3  . Years of education: hs  . Highest education level: Not on file  Social Needs  . Financial resource strain: Not on file  . Food insecurity - worry: Not on file  . Food insecurity - inability: Not on file  . Transportation needs - medical: Not on file  . Transportation needs - non-medical: Not on file  Occupational History  . Occupation: retired Psychologist, sport and exercise: RETIRED  Tobacco Use  . Smoking status: Never Smoker  . Smokeless tobacco: Never Used  Substance and Sexual Activity  . Alcohol use: No    Alcohol/week: 0.0 oz  . Drug use: No  . Sexual activity: No  Other Topics Concern  . Not on file  Social History Narrative  . Not on file   Review of systems: Review of Systems  Constitutional: Negative for fever and chills.  HENT: Negative.   Eyes: Negative for blurred vision.  Respiratory: as per HPI  Cardiovascular: Negative for chest pain and palpitations.  Gastrointestinal: Negative for vomiting, diarrhea, blood per rectum. Genitourinary: Negative for dysuria, urgency, frequency and hematuria.  Musculoskeletal: Negative for myalgias, back pain and joint pain.  Skin: Negative for itching and rash.  Neurological: Negative for dizziness, tremors, focal weakness, seizures and loss of consciousness.  Endo/Heme/Allergies: Negative for environmental allergies.  Psychiatric/Behavioral: Negative for depression, suicidal ideas and hallucinations.  All other systems reviewed and are negative.  Physical Exam: Blood pressure 124/78, pulse 81, height 5\' 6"  (1.676 m), weight 233 lb (105.7 kg), last menstrual period 08/17/1965, SpO2 94 %. Gen:      No acute distress HEENT:  EOMI, sclera anicteric Neck:     No masses; no thyromegaly Lungs:     Bilateral expiratory wheeze; normal respiratory effort CV:         Regular rate and rhythm; no murmurs Abd:      + bowel sounds; soft, non-tender; no palpable masses, no distension Ext:    No edema; adequate peripheral perfusion Skin:      Warm and dry; no rash Neuro: alert and oriented x 3 Psych: normal mood and affect  Data Reviewed: Chest x-ray 05/07/16-pulmonary hyperinflation, chronic interstitial opacity.  No acute abnormality. Images personally  Spirometry 05/07/16 FVC 1.59 (55%], FEV1 1.14 (52%], F/F 71  Assessment:  Moderate persistent asthma with acute  exacerbation She is already on amoxicillin and will complete the whole course Get chest x-ray to rule out pneumonia Start prednisone taper starting at 40 mg.  Reduce dose by 10 mg every 3 days. Continue current inhaler regimen including Symbicort, Spiriva and albuterol as needed  Plan/Recommendations: - Chest x-ray - Finish amoxicillin.  Day 2/7 - Prednisone taper  Marshell Garfinkel MD [facility] Pager [phone] 11/11/2016, 2:38 PM  CC: Brooke Pheasant, MD

## 2016-11-12 ENCOUNTER — Ambulatory Visit: Payer: Medicare PPO | Admitting: Internal Medicine

## 2016-11-17 ENCOUNTER — Ambulatory Visit (INDEPENDENT_AMBULATORY_CARE_PROVIDER_SITE_OTHER): Payer: Medicare PPO

## 2016-11-17 ENCOUNTER — Ambulatory Visit: Payer: Medicare PPO

## 2016-11-17 ENCOUNTER — Other Ambulatory Visit (INDEPENDENT_AMBULATORY_CARE_PROVIDER_SITE_OTHER): Payer: Medicare PPO

## 2016-11-17 ENCOUNTER — Other Ambulatory Visit: Payer: Self-pay | Admitting: Internal Medicine

## 2016-11-17 DIAGNOSIS — I10 Essential (primary) hypertension: Secondary | ICD-10-CM | POA: Diagnosis not present

## 2016-11-17 DIAGNOSIS — E1140 Type 2 diabetes mellitus with diabetic neuropathy, unspecified: Secondary | ICD-10-CM | POA: Diagnosis not present

## 2016-11-17 DIAGNOSIS — E7800 Pure hypercholesterolemia, unspecified: Secondary | ICD-10-CM | POA: Diagnosis not present

## 2016-11-17 DIAGNOSIS — Z23 Encounter for immunization: Secondary | ICD-10-CM

## 2016-11-17 DIAGNOSIS — R7989 Other specified abnormal findings of blood chemistry: Secondary | ICD-10-CM

## 2016-11-17 LAB — HEPATIC FUNCTION PANEL
ALBUMIN: 3.2 g/dL — AB (ref 3.5–5.2)
ALK PHOS: 100 U/L (ref 39–117)
ALT: 21 U/L (ref 0–35)
AST: 26 U/L (ref 0–37)
Bilirubin, Direct: 0.2 mg/dL (ref 0.0–0.3)
Total Bilirubin: 0.6 mg/dL (ref 0.2–1.2)
Total Protein: 7.3 g/dL (ref 6.0–8.3)

## 2016-11-17 LAB — LIPID PANEL
Cholesterol: 118 mg/dL (ref 0–200)
HDL: 48.2 mg/dL (ref >39.00)
LDL Cholesterol: 58 mg/dL (ref 0–99)
NONHDL: 69.63
Total CHOL/HDL Ratio: 2
Triglycerides: 56 mg/dL (ref 0.0–149.0)
VLDL: 11.2 mg/dL (ref 0.0–40.0)

## 2016-11-17 LAB — BASIC METABOLIC PANEL
BUN: 27 mg/dL — AB (ref 6–23)
CO2: 27 mEq/L (ref 19–32)
Calcium: 8.6 mg/dL (ref 8.4–10.5)
Chloride: 107 mEq/L (ref 96–112)
Creatinine, Ser: 1.06 mg/dL (ref 0.40–1.20)
GFR: 52.96 mL/min — AB (ref >60.00)
Glucose, Bld: 95 mg/dL (ref 70–99)
POTASSIUM: 4.8 meq/L (ref 3.5–5.1)
SODIUM: 139 meq/L (ref 135–145)

## 2016-11-17 LAB — HEMOGLOBIN A1C: Hgb A1c MFr Bld: 5.6 % (ref 4.6–6.5)

## 2016-11-17 LAB — TSH: TSH: 4.7 u[IU]/mL — AB (ref 0.35–4.50)

## 2016-11-17 NOTE — Progress Notes (Signed)
Patient comes in today for a high dose influenza injection. Administered in left deltoid IM. Patient tolerated well.

## 2016-11-19 ENCOUNTER — Ambulatory Visit: Payer: Medicare PPO | Admitting: Internal Medicine

## 2016-11-30 ENCOUNTER — Other Ambulatory Visit: Payer: Self-pay | Admitting: Internal Medicine

## 2016-12-07 ENCOUNTER — Other Ambulatory Visit: Payer: Self-pay | Admitting: Internal Medicine

## 2016-12-17 DIAGNOSIS — I10 Essential (primary) hypertension: Secondary | ICD-10-CM | POA: Diagnosis not present

## 2016-12-17 DIAGNOSIS — E785 Hyperlipidemia, unspecified: Secondary | ICD-10-CM | POA: Diagnosis not present

## 2016-12-17 DIAGNOSIS — I4891 Unspecified atrial fibrillation: Secondary | ICD-10-CM | POA: Diagnosis not present

## 2016-12-17 DIAGNOSIS — Z6838 Body mass index (BMI) 38.0-38.9, adult: Secondary | ICD-10-CM | POA: Diagnosis not present

## 2016-12-17 DIAGNOSIS — M545 Low back pain: Secondary | ICD-10-CM | POA: Diagnosis not present

## 2016-12-17 DIAGNOSIS — G40909 Epilepsy, unspecified, not intractable, without status epilepticus: Secondary | ICD-10-CM | POA: Diagnosis not present

## 2016-12-17 DIAGNOSIS — J449 Chronic obstructive pulmonary disease, unspecified: Secondary | ICD-10-CM | POA: Diagnosis not present

## 2016-12-17 DIAGNOSIS — F3340 Major depressive disorder, recurrent, in remission, unspecified: Secondary | ICD-10-CM | POA: Diagnosis not present

## 2016-12-31 ENCOUNTER — Other Ambulatory Visit: Payer: Self-pay

## 2016-12-31 MED ORDER — METOPROLOL SUCCINATE ER 50 MG PO TB24: 50.0000 mg | ORAL_TABLET | Freq: Two times a day (BID) | ORAL | 2 refills | Status: DC

## 2017-01-01 ENCOUNTER — Other Ambulatory Visit: Payer: Self-pay | Admitting: Internal Medicine

## 2017-01-01 ENCOUNTER — Other Ambulatory Visit: Payer: Self-pay | Admitting: Neurology

## 2017-01-06 ENCOUNTER — Telehealth: Payer: Self-pay | Admitting: *Deleted

## 2017-01-06 ENCOUNTER — Other Ambulatory Visit (INDEPENDENT_AMBULATORY_CARE_PROVIDER_SITE_OTHER): Payer: Medicare PPO

## 2017-01-06 DIAGNOSIS — R7989 Other specified abnormal findings of blood chemistry: Secondary | ICD-10-CM | POA: Diagnosis not present

## 2017-01-06 LAB — TSH: TSH: 3.57 u[IU]/mL (ref 0.35–4.50)

## 2017-01-06 NOTE — Telephone Encounter (Signed)
Noted, thank you

## 2017-01-06 NOTE — Telephone Encounter (Signed)
Pt returned RN's call, an appt was scheduled with Hoyle Sauer for 03/25/17

## 2017-01-06 NOTE — Telephone Encounter (Addendum)
LVM for pt to call office.  Dr Jannifer Franklin recently refilled pt Dilantin. She needs a f/u. Has not been seen in over a year. Ok for pt to see CM,NP per CW,MD.  Can offer appt with next available with NP if she calls back.

## 2017-02-01 ENCOUNTER — Other Ambulatory Visit: Payer: Self-pay | Admitting: Internal Medicine

## 2017-02-08 ENCOUNTER — Other Ambulatory Visit: Payer: Self-pay | Admitting: Neurology

## 2017-02-08 ENCOUNTER — Other Ambulatory Visit: Payer: Self-pay | Admitting: Internal Medicine

## 2017-02-22 ENCOUNTER — Encounter: Payer: Self-pay | Admitting: Internal Medicine

## 2017-02-22 ENCOUNTER — Ambulatory Visit: Payer: Medicare PPO | Admitting: Internal Medicine

## 2017-02-22 DIAGNOSIS — J4540 Moderate persistent asthma, uncomplicated: Secondary | ICD-10-CM

## 2017-02-22 DIAGNOSIS — I4891 Unspecified atrial fibrillation: Secondary | ICD-10-CM | POA: Diagnosis not present

## 2017-02-22 NOTE — Progress Notes (Signed)
Patient ID: Brooke Baker, female    DOB: 1934/08/23, 83 y.o.   MRN: [id number]  HPI female never smoker followed for Asthma, allergic rhinitis, complicated by history DM, CAD, CVA/A. Fib/ anticoagulation, Seizure disorder Office Spirometry 05/07/16-moderately severe restriction of exhaled volume, moderate obstructive airways disease. FVC 1.59/55%, FEV1 1.14/52%, ratio 0.71, FEF 25-75% 0.75/47%  ------------------------------------------------------------------------------------  05/07/16- 83 year old female never smoker followed for Asthma, allergic rhinitis, complicated by history DM, CAD, CVA/A. Fib/ anticoagulation, Seizure disorder Got Augmentin and prednisone taper 1/24 Called prednisone taper and Z-Pak 4/9 Had been sleeping in recliner FOLLOWS FOR: Pt denies any wheezing,cough, congestion, or SOB at this time. Doing well overall.  Patient seen in the office today and instructed on use of Spiriva Respimat 2.5.  Patient expressed understanding and demonstrated technique. Katie Cabery  CXR 01/29/16- IMPRESSION: Reactive airway disease with chronic interstitial prominence. There may be superimposed acute pneumonitis or acute bronchitis resulting in the increased prominence of the interstitial markings as compared to the previous study. Followup PA and lateral chest X-ray is recommended in 3-4 weeks following trial of antibiotic therapy to ensure resolution and exclude underlying malignancy Office Spirometry 05/07/16-moderately severe restriction of exhaled volume, moderate obstructive airways disease  02/22/17- 83 year old female never smoker followed for Asthma, allergic rhinitis, complicated by history DM, CAD, CVA/ A.Fib/ anticoagulation, Seizure disorder Ventolin HFA, Spiriva Respimat, Symbicort, nebulizer albuterol ----Cough; follow up per PM for acute visit. Pt states she is feeling much better since stopping her allergy injections. No cough now and feels well. CXR  11/11/16 IMPRESSION: Chronic interstitial changes without acute abnormality.  Review of Systems-see HPI   + = positive Constitutional:   No-   weight loss, night sweats, fevers, chills, + fatigue, lassitude. HEENT:   No-  headaches, difficulty swallowing, tooth/dental problems, sore throat,       No-  sneezing, itching, ear ache, nasal congestion, CV:  No-   chest pain,  +orthopnea, PND, swelling in lower extremities, anasarca,  dizziness, palpitations Resp: +shortness of breath with exertion or at rest.               productive cough,   non-productive cough,  No- coughing up of blood.             change in color of mucus.   wheezing.   Skin: No-   rash or lesions. GI:  No-   heartburn, indigestion, abdominal pain, nausea, vomiting,  GU: MS:  No-   joint pain or swelling.  . Neuro-     nothing unusual Psych:  No- change in mood or affect. No depression or anxiety.  No memory loss.   Objective:   Physical Exam General- Alert, Oriented, Affect-appropriate, Distress- none acute,   +obese Skin- rash-none, lesions- none, excoriation- none Lymphadenopathy- none Head- atraumatic            Eyes- Gross vision intact, PERRLA, conjunctivae clear secretions. Bilateral shiners            Ears- Hearing, canals-normal            Nose- Sniffing +, no-Septal dev, mucus, polyps, erosion, perforation             Throat- Mallampati II , mucosa clear , drainage- none, tonsils- atrophic Neck- flexible , trachea midline, no stridor , thyroid nl, carotid no bruit Chest - symmetrical excursion , unlabored           Heart/CV-+ almost regular , no murmur , no gallop  , no rub, nl  s1 s2                           - JVD- none , edema- 1+ , stasis changes- none, varices- none           Lung-     +few crackles/unlabored, wheeze-none            Chest wall-  Abd-  Br/ Gen/ Rectal- Not done, not indicated Extrem- cyanosis- none, clubbing, none, atrophy- none, strength- nl, + heavy legs with edema,            Neuro- grossly intact to observation

## 2017-02-22 NOTE — Patient Instructions (Signed)
We can continue present meds  Please call if we can help 

## 2017-02-25 ENCOUNTER — Ambulatory Visit (INDEPENDENT_AMBULATORY_CARE_PROVIDER_SITE_OTHER): Payer: Medicare PPO

## 2017-02-25 VITALS — BP 122/72 | HR 69 | Temp 97.9°F | Resp 14 | Ht 65.5 in | Wt 236.4 lb

## 2017-02-25 DIAGNOSIS — Z0000 Encounter for general adult medical examination without abnormal findings: Secondary | ICD-10-CM | POA: Diagnosis not present

## 2017-02-25 NOTE — Patient Instructions (Addendum)
  Brooke Baker , Thank you for taking time to come for your Medicare Wellness Visit. I appreciate your ongoing commitment to your health goals. Please review the following plan we discussed and let me know if I can assist you in the future.   Follow up with Dr. Nicki Reaper as needed.    Stay dry and warm!  These are the goals we discussed: Goals    . Healthy Lifestyle     Brain activity games Chair and standing exercises Walk for exercise as tolerated Stay hydrated Healthy diet       This is a list of the screening recommended for you and due dates:  Health Maintenance  Topic Date Due  . Eye exam for diabetics  06/26/1946  . Tetanus Vaccine  06/26/1955  . Mammogram  03/11/2017  . Hemoglobin A1C  05/17/2017  . Complete foot exam   10/30/2017  . Flu Shot  Completed  . DEXA scan (bone density measurement)  Completed  . Pneumonia vaccines  Completed

## 2017-02-25 NOTE — Progress Notes (Addendum)
Subjective:   Brooke Baker is a 83 y.o. female who presents for Medicare Annual (Subsequent) preventive examination.  Review of Systems:  No ROS.  Medicare Wellness Visit. Additional risk factors are reflected in the social history. Cardiac Risk Factors include: advanced age (>31men, >50 women);hypertension;diabetes mellitus;obesity (BMI >30kg/m2)     Objective:     Vitals: BP 122/72 (BP Location: Left Arm, Patient Position: Sitting, Cuff Size: Normal)   Pulse 69   Temp 97.9 F (36.6 C) (Oral)   Resp 14   Ht 5' 5.5" (1.664 m)   Wt 236 lb 6.4 oz (107.2 kg)   LMP 08/17/1965   SpO2 95%   BMI 38.74 kg/m   Body mass index is 38.74 kg/m.  Advanced Directives 02/25/2017 02/25/2016  Does Patient Have a Medical Advance Directive? No No  Would patient like information on creating a medical advance directive? No - Patient declined No - Patient declined    Tobacco Social History   Tobacco Use  Smoking Status Never Smoker  Smokeless Tobacco Never Used     Counseling given: Not Answered   Clinical Intake:  Pre-visit preparation completed: Yes  Pain : No/denies pain     Nutritional Status: BMI > 30  Obese Diabetes: Yes(Followed by PCP)  How often do you need to have someone help you when you read instructions, pamphlets, or other written materials from your doctor or pharmacy?: 1 - Never  Interpreter Needed?: No     Past Medical History:  Diagnosis Date  . Acute bronchitis   . Allergic rhinitis   . Arthritis   . CAD (coronary artery disease)    s/p stent LAD 8/03 and stent placement OM1  . Cancer ([place])    skin  . Chronic back pain   . COPD with asthma ([place])   . CVA (cerebral vascular accident) ([place])   . Diabetes mellitus ([place])   . Hypercholesterolemia   . Hypertension   . Seizure disorder [facility])    Past Surgical History:  Procedure Laterality Date  . BREAST BIOPSY Bilateral    axillas  . CORONARY STENT PLACEMENT  8/03   LAD  . CORONARY STENT  PLACEMENT     OM1  2006  . FEMUR FRACTURE SURGERY Right [id number]   Dr. Marry Guan  . LUMBAR SPINE SURGERY     x3  . TOTAL ABDOMINAL HYSTERECTOMY     appendectomy - age 21  . TOTAL HIP ARTHROPLASTY    . TOTAL KNEE ARTHROPLASTY     2005   Family History  Problem Relation Age of Onset  . Allergies Brother   . Cancer Brother   . Allergies Sister   . Cancer Sister        Non-hodgkins lymphoma  . Asthma Sister   . Rheum arthritis Sister   . Stroke Mother   . Asthma Brother   . Rheum arthritis Brother   . Cancer Sister        non-hodgkins lymphoma  . Diabetes Brother    Social History   Socioeconomic History  . Marital status: Married    Spouse name: None  . Number of children: 3  . Years of education: hs  . Highest education level: None  Social Needs  . Financial resource strain: Not hard at all  . Food insecurity - worry: Never true  . Food insecurity - inability: Never true  . Transportation needs - medical: No  . Transportation needs - non-medical: No  Occupational History  . Occupation:  retired Psychologist, sport and exercise: RETIRED  Tobacco Use  . Smoking status: Never Smoker  . Smokeless tobacco: Never Used  Substance and Sexual Activity  . Alcohol use: No    Alcohol/week: 0.0 oz  . Drug use: No  . Sexual activity: No  Other Topics Concern  . None  Social History Narrative  . None    Outpatient Encounter Medications as of 02/25/2017  Medication Sig  . albuterol (PROVENTIL) (2.5 MG/3ML) 0.083% nebulizer solution USE ONE VIAL VIA NEBULZIER EVERY FOUR HOURS AS NEEDED FOR WHEEZING  . amLODipine (NORVASC) 10 MG tablet TAKE ONE (1) TABLET EACH DAY  . aspirin 81 MG tablet Take 81 mg by mouth daily.    . dabigatran (PRADAXA) 150 MG CAPS Take 150 mg by mouth every 12 (twelve) hours.  [name] EPINEPHrine (EPIPEN) 0.3 mg/0.3 mL DEVI Inject 0.3 mLs (0.3 mg total) into the muscle once.  . fluticasone (FLONASE) 50 MCG/ACT nasal spray USE TWO SPRAYS IN EACH NOSTRIL EACH DAY AS  DIRECTED BY PHYSICIAN  . hydrochlorothiazide (HYDRODIURIL) 25 MG tablet TAKE ONE (1) TABLET EACH DAY  . levETIRAcetam (KEPPRA) 750 MG tablet TAKE ONE TABLET TWICE DAILY  . losartan (COZAAR) 100 MG tablet TAKE ONE (1) TABLET EACH DAY  . metoprolol succinate (TOPROL-XL) 50 MG 24 hr tablet Take 1 tablet (50 mg total) by mouth 2 (two) times daily. Take with or immediately following a meal.  . Omega-3 Fatty Acids (FISH OIL) 1000 MG CAPS Take 1 capsule by mouth 2 (two) times daily.    . phenytoin (DILANTIN) 100 MG ER capsule TAKE 4 CAPSULES BY MOUTH AT BEDTIME  . rosuvastatin (CRESTOR) 20 MG tablet TAKE ONE (1) TABLET EACH DAY  . sertraline (ZOLOFT) 50 MG tablet TAKE 1 AND ONE-HALF TABLETS BY MOUTH ONCE DAILY  . SYMBICORT 80-4.5 MCG/ACT inhaler INHALE 2 PUFFS TWICE A DAY  . Tiotropium Bromide Monohydrate (SPIRIVA RESPIMAT) 2.5 MCG/ACT AERS Inhale 2 puffs into the lungs daily.  . VENTOLIN HFA 108 (90 Base) MCG/ACT inhaler USE 2 PUFFS EVERY 4 HOURS AS DIRECTED - RESCUE  . [DISCONTINUED] amoxicillin (AMOXIL) 500 MG tablet Take 1 tablet (500 mg total) 2 (two) times daily by mouth.  . [DISCONTINUED] predniSONE (DELTASONE) 10 MG tablet 4 tabs x 3 days, 3 tabs x 3 days, 2 tabs x 3 days, 1 tab x 3 days then stop   No facility-administered encounter medications on file as of 02/25/2017.     Activities of Daily Living In your present state of health, do you have any difficulty performing the following activities: 02/25/2017  Hearing? N  Vision? N  Difficulty concentrating or making decisions? N  Walking or climbing stairs? Y  Comment Unsteady gait  Dressing or bathing? N  Doing errands, shopping? Y  Comment She does not drive.  Preparing Food and eating ? N  Using the Toilet? N  In the past six months, have you accidently leaked urine? Y  Comment managed with a daily pad  Do you have problems with loss of bowel control? N  Managing your Medications? N  Managing your Finances? Y  Comment husband  assists  Housekeeping or managing your Housekeeping? Y  Comment daughter assists weekly  Some recent data might be hidden    Patient Care Team: Einar Pheasant, MD as PCP - General    Assessment:   This is a routine wellness examination for [place].  The goal of the wellness visit is to assist the patient how to close  the gaps in care and create a preventative care plan for the patient.   The roster of all physicians providing medical care to patient is listed in the Snapshot section of the chart.  Osteoporosis risk reviewed.    Safety issues reviewed; Smoke and carbon monoxide detectors in the home. No firearms or firearms locked in a safe within the home. Wears seatbelts when riding with others. No violence in the home.  They do not have excessive sun exposure.  Discussed the need for sun protection: hats, long sleeves and the use of sunscreen if there is significant sun exposure.  Patient is alert, normal appearance, oriented to person/place/and time.  Correctly identified the president of the [place] and recalls of 2/3 words. Performs simple calculations and can read correct time from watch face.  Displays appropriate judgement.  No new identified risk were noted.  No failures at ADL's or IADL's.  Ambulates with cane.   BMI- discussed the importance of a healthy diet, water intake and the benefits of aerobic exercise. Educational material provided.   24 hour diet recall: Regular diet.  Daily fluid intake: 1-2 cups of caffeine, 4-5 cups of water.  Dental- dentures.  Eye- Visual acuity not assessed per patient preference.  Wears corrective lenses.  Sleep patterns- Sleeps 7-8 hours at night.  Wakes feeling rested,  TDAP vaccine deferred per patient preference.  Follow up with insurance.  Educational material provided.  Patient Concerns: None at this time. Follow up with PCP as needed.  Exercise Activities and Dietary recommendations Current Exercise Habits: Home  exercise routine, Time (Minutes): 10, Frequency (Times/Week): 4, Weekly Exercise (Minutes/Week): 40, Intensity: Mild  Goals    . Healthy Lifestyle     Brain activity games Chair and standing exercises Walk for exercise as tolerated Stay hydrated Healthy diet       Fall Risk Fall Risk  02/25/2017 02/25/2016 09/13/2015 05/07/2015 11/29/2013  Falls in the past year? No Yes No No Yes  Number falls in past yr: - 2 or more - - 1  Injury with Fall? - Yes - - No  Risk Factor Category  - High Fall Risk - - -  Risk for fall due to : - History of fall(s) - - -  Follow up - Falls prevention discussed;Education provided - - -   Depression Screen PHQ 2/9 Scores 02/25/2017 02/25/2016 09/13/2015 05/07/2015  PHQ - 2 Score 0 0 0 0     Cognitive Function MMSE - Mini Mental State Exam 02/25/2016  Orientation to time 5  Orientation to Place 5  Registration 3  Attention/ Calculation 5  Recall 3  Language- name 2 objects 2  Language- repeat 1  Language- follow 3 step command 3  Language- read & follow direction 1  Write a sentence 1  Copy design 1  Total score 30     6CIT Screen 02/25/2017  What Year? 0 points  What month? 0 points  What time? 0 points  Count back from 20 0 points  Months in reverse 0 points  Repeat phrase 0 points  Total Score 0    Immunization History  Administered Date(s) Administered  . Influenza Split 11/06/2010, 11/02/2011, 10/17/2012, 10/12/2013  . Influenza Whole 10/28/2004, 10/05/2008  . Influenza, High Dose Seasonal PF 09/13/2015, 11/17/2016  . Influenza-Unspecified 10/04/2014  . Pneumococcal Conjugate-13 11/19/2012  . Pneumococcal Polysaccharide-23 01/05/2005, 10/04/2014  . Zoster 12/22/2012   Screening Tests Health Maintenance  Topic Date Due  . OPHTHALMOLOGY EXAM  06/26/1946  . TETANUS/TDAP  06/26/1955  . MAMMOGRAM  03/11/2017  . HEMOGLOBIN A1C  05/17/2017  . FOOT EXAM  10/30/2017  . INFLUENZA VACCINE  Completed  . DEXA SCAN  Completed  . PNA vac Low  Risk Adult  Completed      Plan:   End of life planning; Advanced aging; Advanced directives discussed.  No HCPOA/Living Will.  Additional information declined at this time.  I have personally reviewed and noted the following in the patient's chart:   . Medical and social history . Use of alcohol, tobacco or illicit drugs  . Current medications and supplements . Functional ability and status . Nutritional status . Physical activity . Advanced directives . List of other physicians . Hospitalizations, surgeries, and ER visits in previous 12 months . Vitals . Screenings to include cognitive, depression, and falls . Referrals and appointments  In addition, I have reviewed and discussed with patient certain preventive protocols, quality metrics, and best practice recommendations. A written personalized care plan for preventive services as well as general preventive health recommendations were provided to patient.     Varney Biles, LPN  9/77/4142   Reviewed above information.  Agree with assessment and plan.    Dr Nicki Reaper

## 2017-02-26 NOTE — Assessment & Plan Note (Signed)
Ventricular response rate very well controlled at this visit. Managed by cardiology.

## 2017-02-26 NOTE — Assessment & Plan Note (Signed)
Better control now with little cough. Current meds are appropriate.

## 2017-03-02 ENCOUNTER — Encounter: Payer: Self-pay | Admitting: Internal Medicine

## 2017-03-02 ENCOUNTER — Ambulatory Visit (INDEPENDENT_AMBULATORY_CARE_PROVIDER_SITE_OTHER): Payer: Medicare PPO | Admitting: Internal Medicine

## 2017-03-02 DIAGNOSIS — I10 Essential (primary) hypertension: Secondary | ICD-10-CM

## 2017-03-02 DIAGNOSIS — J4540 Moderate persistent asthma, uncomplicated: Secondary | ICD-10-CM | POA: Diagnosis not present

## 2017-03-02 DIAGNOSIS — E7800 Pure hypercholesterolemia, unspecified: Secondary | ICD-10-CM | POA: Diagnosis not present

## 2017-03-02 DIAGNOSIS — I2510 Atherosclerotic heart disease of native coronary artery without angina pectoris: Secondary | ICD-10-CM | POA: Diagnosis not present

## 2017-03-02 DIAGNOSIS — L4050 Arthropathic psoriasis, unspecified: Secondary | ICD-10-CM

## 2017-03-02 DIAGNOSIS — G40909 Epilepsy, unspecified, not intractable, without status epilepticus: Secondary | ICD-10-CM | POA: Diagnosis not present

## 2017-03-02 DIAGNOSIS — Z8673 Personal history of transient ischemic attack (TIA), and cerebral infarction without residual deficits: Secondary | ICD-10-CM | POA: Diagnosis not present

## 2017-03-02 DIAGNOSIS — M545 Low back pain: Secondary | ICD-10-CM

## 2017-03-02 DIAGNOSIS — I4891 Unspecified atrial fibrillation: Secondary | ICD-10-CM | POA: Diagnosis not present

## 2017-03-02 DIAGNOSIS — E1140 Type 2 diabetes mellitus with diabetic neuropathy, unspecified: Secondary | ICD-10-CM

## 2017-03-02 DIAGNOSIS — G8929 Other chronic pain: Secondary | ICD-10-CM

## 2017-03-02 DIAGNOSIS — Z6839 Body mass index (BMI) 39.0-39.9, adult: Secondary | ICD-10-CM

## 2017-03-02 DIAGNOSIS — F439 Reaction to severe stress, unspecified: Secondary | ICD-10-CM

## 2017-03-02 MED ORDER — FLUTICASONE PROPIONATE 50 MCG/ACT NA SUSP: NASAL | 3 refills | Status: DC

## 2017-03-02 NOTE — Progress Notes (Signed)
Patient ID: Brooke Baker, female   DOB: [birth year], 83 y.o.   MRN: [id number]   Subjective:    Patient ID: Brooke Baker, female    DOB: 1934-02-21, 83 y.o.   MRN: [id number]  HPI  Patient here for a scheduled follow up.  She is accompanied by her husband.  History obtained from both of them.  She reports she is feeling better.  Breathing better.  No increased cough or congestion.  No sob.  No chest pain.  No increased heart rate or palpitations.  No acid reflux.  No abdominal pain.  Bowels moving.  No urine change.  Off allergy shots and has done well off.     Past Medical History:  Diagnosis Date  . Acute bronchitis   . Allergic rhinitis   . Arthritis   . CAD (coronary artery disease)    s/p stent LAD 8/03 and stent placement OM1  . Cancer ([place])    skin  . Chronic back pain   . COPD with asthma ([place])   . CVA (cerebral vascular accident) ([place])   . Diabetes mellitus ([place])   . Hypercholesterolemia   . Hypertension   . Seizure disorder [facility])    Past Surgical History:  Procedure Laterality Date  . BREAST BIOPSY Bilateral    axillas  . CORONARY STENT PLACEMENT  8/03   LAD  . CORONARY STENT PLACEMENT     OM1  2006  . FEMUR FRACTURE SURGERY Right [id number]   Dr. Marry Guan  . LUMBAR SPINE SURGERY     x3  . TOTAL ABDOMINAL HYSTERECTOMY     appendectomy - age 82  . TOTAL HIP ARTHROPLASTY    . TOTAL KNEE ARTHROPLASTY     2005   Family History  Problem Relation Age of Onset  . Allergies Brother   . Cancer Brother   . Allergies Sister   . Cancer Sister        Non-hodgkins lymphoma  . Asthma Sister   . Rheum arthritis Sister   . Stroke Mother   . Asthma Brother   . Rheum arthritis Brother   . Cancer Sister        non-hodgkins lymphoma  . Diabetes Brother    Social History   Socioeconomic History  . Marital status: Married    Spouse name: None  . Number of children: 3  . Years of education: hs  . Highest education level: None  Social Needs  . Financial resource  strain: Not hard at all  . Food insecurity - worry: Never true  . Food insecurity - inability: Never true  . Transportation needs - medical: No  . Transportation needs - non-medical: No  Occupational History  . Occupation: retired Psychologist, sport and exercise: RETIRED  Tobacco Use  . Smoking status: Never Smoker  . Smokeless tobacco: Never Used  Substance and Sexual Activity  . Alcohol use: No    Alcohol/week: 0.0 oz  . Drug use: No  . Sexual activity: No  Other Topics Concern  . None  Social History Narrative  . None    Outpatient Encounter Medications as of 03/02/2017  Medication Sig  . albuterol (PROVENTIL) (2.5 MG/3ML) 0.083% nebulizer solution USE ONE VIAL VIA NEBULZIER EVERY FOUR HOURS AS NEEDED FOR WHEEZING  . amLODipine (NORVASC) 10 MG tablet TAKE ONE (1) TABLET EACH DAY  . aspirin 81 MG tablet Take 81 mg by mouth daily.    . dabigatran (PRADAXA) 150 MG CAPS Take 150 mg  by mouth every 12 (twelve) hours.  [name] EPINEPHrine (EPIPEN) 0.3 mg/0.3 mL DEVI Inject 0.3 mLs (0.3 mg total) into the muscle once.  . fluticasone (FLONASE) 50 MCG/ACT nasal spray USE TWO SPRAYS IN EACH NOSTRIL EACH DAY AS DIRECTED BY PHYSICIAN  . hydrochlorothiazide (HYDRODIURIL) 25 MG tablet TAKE ONE (1) TABLET EACH DAY  . levETIRAcetam (KEPPRA) 750 MG tablet TAKE ONE TABLET TWICE DAILY  . losartan (COZAAR) 100 MG tablet TAKE ONE (1) TABLET EACH DAY  . metoprolol succinate (TOPROL-XL) 50 MG 24 hr tablet Take 1 tablet (50 mg total) by mouth 2 (two) times daily. Take with or immediately following a meal.  . Omega-3 Fatty Acids (FISH OIL) 1000 MG CAPS Take 1 capsule by mouth 2 (two) times daily.    . phenytoin (DILANTIN) 100 MG ER capsule TAKE 4 CAPSULES BY MOUTH AT BEDTIME  . rosuvastatin (CRESTOR) 20 MG tablet TAKE ONE (1) TABLET EACH DAY  . sertraline (ZOLOFT) 50 MG tablet TAKE 1 AND ONE-HALF TABLETS BY MOUTH ONCE DAILY  . SYMBICORT 80-4.5 MCG/ACT inhaler INHALE 2 PUFFS TWICE A DAY  . Tiotropium Bromide  Monohydrate (SPIRIVA RESPIMAT) 2.5 MCG/ACT AERS Inhale 2 puffs into the lungs daily.  . VENTOLIN HFA 108 (90 Base) MCG/ACT inhaler USE 2 PUFFS EVERY 4 HOURS AS DIRECTED - RESCUE  . [DISCONTINUED] fluticasone (FLONASE) 50 MCG/ACT nasal spray USE TWO SPRAYS IN EACH NOSTRIL EACH DAY AS DIRECTED BY PHYSICIAN   No facility-administered encounter medications on file as of 03/02/2017.     Review of Systems  Constitutional: Negative for appetite change and unexpected weight change.  HENT: Negative for congestion and sinus pressure.   Respiratory: Negative for cough and chest tightness.        Breathing better.    Cardiovascular: Negative for chest pain, palpitations and leg swelling.  Gastrointestinal: Negative for abdominal pain, diarrhea, nausea and vomiting.  Genitourinary: Negative for difficulty urinating and dysuria.  Musculoskeletal: Negative for joint swelling and myalgias.  Skin: Negative for color change and rash.  Neurological: Negative for dizziness, light-headedness and headaches.  Psychiatric/Behavioral: Negative for agitation and dysphoric mood.       Increased stress with some family issues.  Discussed with her and her husband today.  Overall she feels she is handling things relatively well.  Does not feel she needs anything more at this time.         Objective:    Physical Exam  Constitutional: She appears well-developed and well-nourished. No distress.  HENT:  Nose: Nose normal.  Mouth/Throat: Oropharynx is clear and moist.  Neck: Neck supple. No thyromegaly present.  Cardiovascular: Normal rate and regular rhythm.  Pulmonary/Chest: Breath sounds normal. No respiratory distress. She has no wheezes.  Abdominal: Soft. Bowel sounds are normal. There is no tenderness.  Musculoskeletal: She exhibits no edema or tenderness.  Lymphadenopathy:    She has no cervical adenopathy.  Skin: No rash noted. No erythema.  Psychiatric: She has a normal mood and affect. Her behavior is  normal.    BP 128/68 (BP Location: Left Arm, Patient Position: Sitting, Cuff Size: Large)   Pulse 78   Temp 97.9 F (36.6 C) (Oral)   Resp 18   Wt 238 lb (108 kg)   LMP 08/17/1965   SpO2 97%   BMI 39.00 kg/m  Wt Readings from Last 3 Encounters:  03/02/17 238 lb (108 kg)  02/25/17 236 lb 6.4 oz (107.2 kg)  02/22/17 235 lb 9.6 oz (106.9 kg)     Lab  Results  Component Value Date   WBC 5.6 06/26/2016   HGB 12.3 06/26/2016   HCT 36.1 06/26/2016   PLT 158.0 06/26/2016   GLUCOSE 95 11/17/2016   CHOL 118 11/17/2016   TRIG 56.0 11/17/2016   HDL 48.20 11/17/2016   LDLCALC 58 11/17/2016   ALT 21 11/17/2016   AST 26 11/17/2016   NA 139 11/17/2016   K 4.8 11/17/2016   CL 107 11/17/2016   CREATININE 1.06 11/17/2016   BUN 27 (H) 11/17/2016   CO2 27 11/17/2016   TSH 3.57 01/06/2017   INR 1.3 05/12/2012   HGBA1C 5.6 11/17/2016   MICROALBUR 0.8 06/26/2016    Dg Chest 2 View  Result Date: 11/12/2016 CLINICAL DATA:  Productive cough and wheezing for 2 weeks EXAM: CHEST  2 VIEW COMPARISON:  05/07/2016 FINDINGS: Cardiac shadow is stable. Diffuse interstitial changes are again identified throughout both lungs. No focal infiltrate or sizable effusion is noted. Aortic calcifications are seen. No acute bony abnormality is seen. IMPRESSION: Chronic interstitial changes without acute abnormality. Electronically Signed   By: Inez Catalina M.D.   On: 11/12/2016 08:23       Assessment & Plan:   Problem List Items Addressed This Visit    Asthma, moderate persistent    Breathing better.  Doing well.  Off allergy shots.        Atrial fibrillation ([place])    Controlled ventricular rate.  Followed by cardiology.  Doing well.        BMI 39.0-39.9,adult    Diet and exercise.  Follow.        Chronic back pain    Stable.  Does limit her mobility.        Coronary atherosclerosis    Followed by cardiology.  No chest pain.  Continue risk factor modification.        Diabetes mellitus ([place])     Low carb diet and exercise.  Follow met b and a1c.        Relevant Orders   Hemoglobin J8S   Basic metabolic panel   History of CVA (cerebrovascular accident)    No recurring symptoms.  Continue current medications.  Follow.        Hypercholesterolemia    On crestor.  Low cholesterol diet and exercise.  Follow lipid panel and liver function tests.       Relevant Orders   Lipid panel   Hepatic function panel   Hypertension    Blood pressure under good control.  Continue same medication regimen.  Follow pressures.  Follow metabolic panel.        Relevant Orders   TSH   CBC with Differential/Platelet   Psoriatic arthritis ([place])    Has been followed by rheumatology.        Seizure disorder [facility])    Has been followed by neurology.  No recurring seizures.  Follow.       Stress    Discussed with her today.  On zoloft.  Overall she feels she is handling things relatively well. Desires no further intervention. Follow.            Einar Pheasant, MD

## 2017-03-07 ENCOUNTER — Encounter: Payer: Self-pay | Admitting: Internal Medicine

## 2017-03-07 DIAGNOSIS — Z6839 Body mass index (BMI) 39.0-39.9, adult: Secondary | ICD-10-CM | POA: Insufficient documentation

## 2017-03-07 NOTE — Assessment & Plan Note (Signed)
No recurring symptoms.  Continue current medications.  Follow.

## 2017-03-07 NOTE — Assessment & Plan Note (Signed)
Has been followed by neurology.  No recurring seizures.  Follow.

## 2017-03-07 NOTE — Assessment & Plan Note (Signed)
Low carb diet and exercise.  Follow met b and a1c.

## 2017-03-07 NOTE — Assessment & Plan Note (Signed)
Blood pressure under good control.  Continue same medication regimen.  Follow pressures.  Follow metabolic panel.   

## 2017-03-07 NOTE — Assessment & Plan Note (Signed)
On crestor.  Low cholesterol diet and exercise.  Follow lipid panel and liver function tests.   

## 2017-03-07 NOTE — Assessment & Plan Note (Signed)
Controlled ventricular rate.  Followed by cardiology.  Doing well.

## 2017-03-07 NOTE — Assessment & Plan Note (Signed)
Stable.  Does limit her mobility.

## 2017-03-07 NOTE — Assessment & Plan Note (Signed)
Discussed with her today.  On zoloft.  Overall she feels she is handling things relatively well. Desires no further intervention. Follow.

## 2017-03-07 NOTE — Assessment & Plan Note (Signed)
Diet and exercise.  Follow.  

## 2017-03-07 NOTE — Assessment & Plan Note (Signed)
Has been followed by rheumatology.   

## 2017-03-07 NOTE — Assessment & Plan Note (Signed)
Breathing better.  Doing well.  Off allergy shots.

## 2017-03-07 NOTE — Assessment & Plan Note (Signed)
Followed by cardiology.  No chest pain.  Continue risk factor modification.  

## 2017-03-11 ENCOUNTER — Other Ambulatory Visit: Payer: Self-pay | Admitting: Internal Medicine

## 2017-03-23 NOTE — Progress Notes (Signed)
[facility]  PATIENT: Brooke Baker DOB: July 18, 1934   REASON FOR VISIT: Follow-up for seizure disorder HISTORY FROM: Patient and husband    HISTORY OF PRESENT ILLNESS:Brooke Baker is a 83 year old right-handed white female with a history of seizures. The patient has not had any seizures in a number of years,  Last  2004. The patient indicates that since she went on a combination of Dilantin and Keppra, the seizures have essentially disappeared. The patient fractured her femur last year and still requires a walker/cane for ambulation. She has not had any further falls. She is on a combination of calcium and vitamin D supplementation currently. She is tolerating the medications fairly well, and she recently had blood work done in [place],  [place] and heapatic function through her primary care physician, but a dilantin level was not included.  She is no longer driving.  She remains independent in activities of daily living.  She returns for an evaluation at this time. She needs refills on her medications.  REVIEW OF SYSTEMS: Full 14 system review of systems performed and notable only for those listed, all others are neg:  Constitutional: neg  Cardiovascular: neg Ear/Nose/Throat: neg  Skin: neg Eyes: neg Respiratory: neg Gastroitestinal:  Hematology/Lymphatic: neg  Endocrine: neg Musculoskeletal:neg Allergy/Immunology: neg Neurological: neg Psychiatric: neg Sleep : neg   ALLERGIES: Allergies  Allergen Reactions  . Doxycycline Diarrhea and Nausea Only  . Methotrexate Rash    Mouth broke out    HOME MEDICATIONS: Outpatient Medications Prior to Visit  Medication Sig Dispense Refill  . albuterol (PROVENTIL) (2.5 MG/3ML) 0.083% nebulizer solution USE ONE VIAL VIA NEBULZIER EVERY FOUR HOURS AS NEEDED FOR WHEEZING 180 vial 0  . amLODipine (NORVASC) 10 MG tablet TAKE ONE (1) TABLET EACH DAY 30 tablet 2  . aspirin 81 MG tablet Take 81 mg by mouth daily.       . dabigatran (PRADAXA) 150 MG CAPS Take 150 mg by mouth every 12 (twelve) hours.    . fluticasone (FLONASE) 50 MCG/ACT nasal spray USE TWO SPRAYS IN EACH NOSTRIL EACH DAY AS DIRECTED BY PHYSICIAN 16 g 3  . hydrochlorothiazide (HYDRODIURIL) 25 MG tablet TAKE ONE (1) TABLET EACH DAY 30 tablet 0  . levETIRAcetam (KEPPRA) 750 MG tablet TAKE ONE TABLET TWICE DAILY 60 tablet 0  . losartan (COZAAR) 100 MG tablet TAKE ONE (1) TABLET EACH DAY 30 tablet 0  . metoprolol succinate (TOPROL-XL) 50 MG 24 hr tablet Take 1 tablet (50 mg total) by mouth 2 (two) times daily. Take with or immediately following a meal. 60 tablet 2  . Omega-3 Fatty Acids (FISH OIL) 1000 MG CAPS Take 1 capsule by mouth 2 (two) times daily.      . phenytoin (DILANTIN) 100 MG ER capsule TAKE 4 CAPSULES BY MOUTH AT BEDTIME 120 capsule 1  . rosuvastatin (CRESTOR) 20 MG tablet TAKE ONE (1) TABLET EACH DAY 30 tablet 2  . sertraline (ZOLOFT) 50 MG tablet TAKE 1 AND ONE-HALF TABLETS BY MOUTH ONCE DAILY 45 tablet 0  . SYMBICORT 80-4.5 MCG/ACT inhaler INHALE 2 PUFFS TWICE A DAY 10.2 Inhaler 5  . Tiotropium Bromide Monohydrate (SPIRIVA RESPIMAT) 2.5 MCG/ACT AERS Inhale 2 puffs into the lungs daily. 1 Inhaler 0  . VENTOLIN HFA 108 (90 Base) MCG/ACT inhaler USE 2 PUFFS EVERY 4 HOURS AS DIRECTED - RESCUE 1 Inhaler 6  . EPINEPHrine (EPIPEN) 0.3 mg/0.3 mL DEVI Inject 0.3 mLs (0.3 mg total) into the muscle once. (Patient not taking: Reported  on 03/25/2017) 1 Device prn   No facility-administered medications prior to visit.     PAST MEDICAL HISTORY: Past Medical History:  Diagnosis Date  . Acute bronchitis   . Allergic rhinitis   . Arthritis   . CAD (coronary artery disease)    s/p stent LAD 8/03 and stent placement OM1  . Cancer ([place])    skin  . Chronic back pain   . COPD with asthma ([place])   . CVA (cerebral vascular accident) ([place])   . Diabetes mellitus ([place])   . Hypercholesterolemia   . Hypertension   . Seizure disorder ([place])      PAST SURGICAL HISTORY: Past Surgical History:  Procedure Laterality Date  . BREAST BIOPSY Bilateral    axillas  . CORONARY STENT PLACEMENT  8/03   LAD  . CORONARY STENT PLACEMENT     OM1  2006  . FEMUR FRACTURE SURGERY Right [id number]   Dr. Marry Guan  . LUMBAR SPINE SURGERY     x3  . TOTAL ABDOMINAL HYSTERECTOMY     appendectomy - age 56  . TOTAL HIP ARTHROPLASTY    . TOTAL KNEE ARTHROPLASTY     2005    FAMILY HISTORY: Family History  Problem Relation Age of Onset  . Allergies Brother   . Cancer Brother   . Allergies Sister   . Cancer Sister        Non-hodgkins lymphoma  . Asthma Sister   . Rheum arthritis Sister   . Stroke Mother   . Asthma Brother   . Rheum arthritis Brother   . Cancer Sister        non-hodgkins lymphoma  . Diabetes Brother     SOCIAL HISTORY: Social History   Socioeconomic History  . Marital status: Married    Spouse name: Not on file  . Number of children: 3  . Years of education: hs  . Highest education level: Not on file  Occupational History  . Occupation: retired Psychologist, sport and exercise: RETIRED  Social Needs  . Financial resource strain: Not hard at all  . Food insecurity:    Worry: Never true    Inability: Never true  . Transportation needs:    Medical: No    Non-medical: No  Tobacco Use  . Smoking status: Never Smoker  . Smokeless tobacco: Never Used  Substance and Sexual Activity  . Alcohol use: No    Alcohol/week: 0.0 oz  . Drug use: No  . Sexual activity: Never  Lifestyle  . Physical activity:    Days per week: 0 days    Minutes per session: Not on file  . Stress: Not at all  Relationships  . Social connections:    Talks on phone: Not on file    Gets together: Not on file    Attends religious service: Not on file    Active member of club or organization: Not on file    Attends meetings of clubs or organizations: Not on file    Relationship status: Not on file  . Intimate partner violence:    Fear of current  or ex partner: No    Emotionally abused: No    Physically abused: No    Forced sexual activity: No  Other Topics Concern  . Not on file  Social History Narrative   lives with husband- Edsel     PHYSICAL EXAM  Vitals:   03/25/17 1240  BP: (!) 164/72  Pulse: 79  Weight: 236 lb 9.6  oz (107.3 kg)   Body mass index is 38.77 kg/m.  Generalized: Well developed, morbidly obese female in no acute distress  Head: normocephalic and atraumatic,. Oropharynx benign  Neck: Supple,  Musculoskeletal: No deformity  Skin 1-2+ edema below the knees bilaterally  Neurological examination   Mentation: Alert oriented to time, place, history taking. Attention span and concentration appropriate. Recent and remote memory intact.  Follows all commands speech and language fluent.   Cranial nerve II-XIPupils were equal round reactive to light extraocular movements were full, visual field were full on confrontational test. Facial sensation and strength were normal. hearing was intact to finger rubbing bilaterally. Uvula tongue midline. head turning and shoulder shrug were normal and symmetric.Tongue protrusion into cheek strength was normal. Motor: normal bulk and tone, full strength in the BUE, BLE,  Coordination: finger-nose-finger, performed well, has problems with heel-to-shin Reflexes: Symmetric upper and lower, plantar responses were flexor bilaterally. Gait and Station: Rising up from seated position without assistance, wide based stance, walks with a cane no difficulty with turns, tandem gait not attempted, Romberg is negative DIAGNOSTIC DATA (LABS, IMAGING, TESTING) - ASSESSMENT AND PLAN  83 y.o. year old female  has a past medical history of seizure disorder well-controlled on Dilantin and Keppra. Last seizure event 2004  Pt to continue Keppra at current dose, will refill Continue Dilantin at current dose, will refill Will check Dilantin level,  reviewed recent CBC, BMP and hepatic function  done 11/17/16  Call for seizure activity F/U in 1 year Brooke Baker, [facility], [facility], [facility] [address] [place], [place] [address] [phone]

## 2017-03-25 ENCOUNTER — Encounter: Payer: Self-pay | Admitting: Nurse Practitioner

## 2017-03-25 ENCOUNTER — Ambulatory Visit: Payer: Medicare PPO | Admitting: Nurse Practitioner

## 2017-03-25 VITALS — BP 164/72 | HR 79 | Wt 236.6 lb

## 2017-03-25 DIAGNOSIS — Z5181 Encounter for therapeutic drug level monitoring: Secondary | ICD-10-CM | POA: Diagnosis not present

## 2017-03-25 DIAGNOSIS — G40909 Epilepsy, unspecified, not intractable, without status epilepticus: Secondary | ICD-10-CM

## 2017-03-25 MED ORDER — LEVETIRACETAM 750 MG PO TABS: 750.0000 mg | ORAL_TABLET | Freq: Two times a day (BID) | ORAL | 11 refills | Status: DC

## 2017-03-25 MED ORDER — PHENYTOIN SODIUM EXTENDED 100 MG PO CAPS: ORAL_CAPSULE | ORAL | 11 refills | Status: DC

## 2017-03-25 NOTE — Patient Instructions (Addendum)
Pt to continue Keppra at current dose, will refill Continue Dilantin at current dose, will refill Will check Dilantin level,  reviewed recent CBC and BMP and hepatic function11/13/18  Call for seizure activity F/U in 1 year

## 2017-03-25 NOTE — Progress Notes (Signed)
I have read the note, and I agree with the clinical assessment and plan.  Charles K Willis   

## 2017-03-26 ENCOUNTER — Telehealth: Payer: Self-pay | Admitting: Neurology

## 2017-03-26 LAB — PHENYTOIN LEVEL, TOTAL: Phenytoin (Dilantin), Serum: 14.1 ug/mL (ref 10.0–20.0)

## 2017-03-26 NOTE — Telephone Encounter (Signed)
Called the patient to make her aware of the dilantin level being normal. No answer. LVM with this information and instructed the patient to call if she has any questions.

## 2017-03-26 NOTE — Telephone Encounter (Signed)
-----   Message from Dennie Bible, NP sent at 03/26/2017  8:50 AM EDT ----- Dilantin level good please call the patient

## 2017-04-10 ENCOUNTER — Other Ambulatory Visit: Payer: Self-pay | Admitting: Neurology

## 2017-04-10 ENCOUNTER — Other Ambulatory Visit: Payer: Self-pay | Admitting: Internal Medicine

## 2017-04-15 ENCOUNTER — Other Ambulatory Visit: Payer: Self-pay | Admitting: Internal Medicine

## 2017-04-16 ENCOUNTER — Other Ambulatory Visit: Payer: Self-pay | Admitting: Internal Medicine

## 2017-04-16 DIAGNOSIS — Z1231 Encounter for screening mammogram for malignant neoplasm of breast: Secondary | ICD-10-CM

## 2017-04-20 ENCOUNTER — Ambulatory Visit: Payer: Medicare PPO | Admitting: Pulmonary Disease

## 2017-04-20 ENCOUNTER — Telehealth: Payer: Self-pay | Admitting: Internal Medicine

## 2017-04-20 ENCOUNTER — Encounter: Payer: Self-pay | Admitting: Pulmonary Disease

## 2017-04-20 ENCOUNTER — Ambulatory Visit (INDEPENDENT_AMBULATORY_CARE_PROVIDER_SITE_OTHER)
Admission: RE | Admit: 2017-04-20 | Discharge: 2017-04-20 | Disposition: A | Discharge status: Home / Self Care | Payer: Medicare PPO | Source: Ambulatory Visit | Attending: Pulmonary Disease | Admitting: Pulmonary Disease

## 2017-04-20 VITALS — BP 124/66 | HR 80 | Ht 66.0 in | Wt 232.0 lb

## 2017-04-20 DIAGNOSIS — J4541 Moderate persistent asthma with (acute) exacerbation: Secondary | ICD-10-CM

## 2017-04-20 DIAGNOSIS — J849 Interstitial pulmonary disease, unspecified: Secondary | ICD-10-CM | POA: Diagnosis not present

## 2017-04-20 MED ORDER — METHYLPREDNISOLONE ACETATE 80 MG/ML IJ SUSP
80.0000 mg | Freq: Once | INTRAMUSCULAR | Status: AC
  Administered 2017-04-20: 80 mg via INTRAMUSCULAR

## 2017-04-20 MED ORDER — PREDNISONE 10 MG PO TABS: ORAL_TABLET | ORAL | 0 refills | Status: DC

## 2017-04-20 MED ORDER — AMOXICILLIN-POT CLAVULANATE 875-125 MG PO TABS: 1.0000 | ORAL_TABLET | Freq: Two times a day (BID) | ORAL | 0 refills | Status: DC

## 2017-04-20 NOTE — Telephone Encounter (Signed)
Per CY verbally- keep scheduled acute visit with BQ for today at 11:45, Pt is aware and voiced her understanding. Nothing further is needed.

## 2017-04-20 NOTE — Progress Notes (Signed)
Synopsis: Patient of Dr. Annamaria Boots with asthma  Subjective:   PATIENT ID: Brooke Baker GENDER: female DOB: October 27, 1934, MRN: [id number]   HPI  Chief Complaint  Patient presents with  . Acute Visit    CY pt being treated for cough, asthma.  Presents with prod cough with yellow mucus X4 days.  Also notes sinus congestion, pnd, wheezing.      "I got ole asthma" > she says that starting Friday (4 days ago) she started feeling more short of breath > she has more allergies lately (some itching of her throat, none of her eyes) > she completed immunotherapy "not too long ago" > no recent fever or chills > she is coughing up yellow mucus > she has a lot of post nasal drip > she is taking mucinex.  > she I still taking symbicort and spiriva.   > she is using her rescue inhaler at least 2 times per day  Past Medical History:  Diagnosis Date  . Acute bronchitis   . Allergic rhinitis   . Arthritis   . CAD (coronary artery disease)    s/p stent LAD 8/03 and stent placement OM1  . Cancer ([place])    skin  . Chronic back pain   . COPD with asthma ([place])   . CVA (cerebral vascular accident) ([place])   . Diabetes mellitus ([place])   . Hypercholesterolemia   . Hypertension   . Seizure disorder ([place])      Family History  Problem Relation Age of Onset  . Allergies Brother   . Cancer Brother   . Allergies Sister   . Cancer Sister        Non-hodgkins lymphoma  . Asthma Sister   . Rheum arthritis Sister   . Stroke Mother   . Asthma Brother   . Rheum arthritis Brother   . Cancer Sister        non-hodgkins lymphoma  . Diabetes Brother      Social History   Socioeconomic History  . Marital status: Married    Spouse name: Not on file  . Number of children: 3  . Years of education: hs  . Highest education level: Not on file  Occupational History  . Occupation: retired Psychologist, sport and exercise: RETIRED  Social Needs  . Financial resource strain: Not hard at all  . Food insecurity:      Worry: Never true    Inability: Never true  . Transportation needs:    Medical: No    Non-medical: No  Tobacco Use  . Smoking status: Never Smoker  . Smokeless tobacco: Never Used  Substance and Sexual Activity  . Alcohol use: No    Alcohol/week: 0.0 oz  . Drug use: No  . Sexual activity: Never  Lifestyle  . Physical activity:    Days per week: 0 days    Minutes per session: Not on file  . Stress: Not at all  Relationships  . Social connections:    Talks on phone: Not on file    Gets together: Not on file    Attends religious service: Not on file    Active member of club or organization: Not on file    Attends meetings of clubs or organizations: Not on file    Relationship status: Not on file  . Intimate partner violence:    Fear of current or ex partner: No    Emotionally abused: No    Physically abused: No  Forced sexual activity: No  Other Topics Concern  . Not on file  Social History Narrative   lives with husband- Edsel     Allergies  Allergen Reactions  . Doxycycline Diarrhea and Nausea Only  . Methotrexate Rash    Mouth broke out     Outpatient Medications Prior to Visit  Medication Sig Dispense Refill  . albuterol (PROVENTIL) (2.5 MG/3ML) 0.083% nebulizer solution USE ONE VIAL VIA NEBULZIER EVERY FOUR HOURS AS NEEDED FOR WHEEZING 180 vial 0  . amLODipine (NORVASC) 10 MG tablet TAKE ONE (1) TABLET EACH DAY 30 tablet 2  . aspirin 81 MG tablet Take 81 mg by mouth daily.      . dabigatran (PRADAXA) 150 MG CAPS Take 150 mg by mouth every 12 (twelve) hours.    . fluticasone (FLONASE) 50 MCG/ACT nasal spray USE TWO SPRAYS IN EACH NOSTRIL EACH DAY AS DIRECTED BY PHYSICIAN 16 g 3  . hydrochlorothiazide (HYDRODIURIL) 25 MG tablet TAKE ONE (1) TABLET EACH DAY 30 tablet 0  . levETIRAcetam (KEPPRA) 750 MG tablet Take 1 tablet (750 mg total) by mouth 2 (two) times daily. 60 tablet 11  . losartan (COZAAR) 100 MG tablet TAKE ONE (1) TABLET EACH DAY 30 tablet 0  .  metoprolol succinate (TOPROL-XL) 50 MG 24 hr tablet Take 1 tablet (50 mg total) by mouth 2 (two) times daily. Take with or immediately following a meal. 60 tablet 1  . Omega-3 Fatty Acids (FISH OIL) 1000 MG CAPS Take 1 capsule by mouth 2 (two) times daily.      . phenytoin (DILANTIN) 100 MG ER capsule TAKE 4 CAPSULES BY MOUTH AT BEDTIME 120 capsule 11  . rosuvastatin (CRESTOR) 20 MG tablet TAKE ONE (1) TABLET EACH DAY 30 tablet 2  . sertraline (ZOLOFT) 50 MG tablet TAKE 1 AND ONE-HALF TABLETS BY MOUTH ONCE DAILY 45 tablet 0  . SYMBICORT 80-4.5 MCG/ACT inhaler INHALE 2 PUFFS TWICE A DAY 10.2 Inhaler 5  . Tiotropium Bromide Monohydrate (SPIRIVA RESPIMAT) 2.5 MCG/ACT AERS Inhale 2 puffs into the lungs daily. 1 Inhaler 0  . VENTOLIN HFA 108 (90 Base) MCG/ACT inhaler USE 2 PUFFS EVERY 4 HOURS AS DIRECTED - RESCUE 1 Inhaler 6  . EPINEPHrine (EPIPEN) 0.3 mg/0.3 mL DEVI Inject 0.3 mLs (0.3 mg total) into the muscle once. (Patient not taking: Reported on 04/20/2017) 1 Device prn   No facility-administered medications prior to visit.     Review of Systems  Constitutional: Positive for malaise/fatigue. Negative for chills and diaphoresis.  HENT: Negative for congestion, ear pain and sinus pain.   Respiratory: Positive for cough, sputum production, shortness of breath and wheezing.       Objective:  Physical Exam   Vitals:   04/20/17 1152  BP: 124/66  Pulse: 80  SpO2: 94%  Weight: 232 lb (105.2 kg)  Height: 5\' 6"  (1.676 m)    RA  Gen: chronically ill appearing HENT: OP clear, TM's clear, neck supple PULM: Wheezing bilaterally B, normal percussion CV: RRR, no mgr, trace edema GI: BS+, soft, nontender Derm: no cyanosis or rash Psyche: normal mood and affect   CBC    Component Value Date/Time   WBC 5.6 06/26/2016 0954   RBC 3.57 (L) 06/26/2016 0954   HGB 12.3 06/26/2016 0954   HGB 8.5 (L) 05/17/2012 0503   HCT 36.1 06/26/2016 0954   HCT 30.2 (L) 05/12/2012 0722   PLT 158.0  06/26/2016 0954   PLT 163 05/15/2012 0551   MCV 100.9 (H)  06/26/2016 0954   MCV 97 05/12/2012 0722   MCH 34.1 (H) 05/12/2012 0722   MCHC 34.1 06/26/2016 0954   RDW 13.2 06/26/2016 0954   RDW 13.3 05/12/2012 0722   LYMPHSABS 1.0 06/26/2016 0954   LYMPHSABS 0.9 (L) 05/12/2012 0722   MONOABS 0.7 06/26/2016 0954   MONOABS 1.7 (H) 05/12/2012 0722   EOSABS 0.9 (H) 06/26/2016 0954   EOSABS 0.3 05/12/2012 0722   BASOSABS 0.0 06/26/2016 0954   BASOSABS 0.2 (H) 05/12/2012 0722     Chest imaging: November 2018 chest x-ray images independently reviewed showing diffuse interstitial changes which are new compared to multiple prior chest images  PFT:  Labs:  Path:  Echo:  Heart Catheterization:  Records from her last visit with Dr. Annamaria Boots reviewed where she was seen for asthma.     Assessment & Plan:   ILD (interstitial lung disease) ([place]) - Plan: methylPREDNISolone acetate (DEPO-MEDROL) injection 80 mg, DG Chest 2 View  Moderate persistent asthma with acute exacerbation  Impression: Severe persistent asthma with acute exacerbation Interstitial lung disease  Discussion: She has an exacerbation of her asthma.  We will give her her usual treatment with a steroid injection, steroid taper and antibiotic.  I have instructed her to call us if she does not improve.  Her November 2018 chest x-ray showed interstitial lung disease.  I will repeat a chest x-ray to evaluate this further.  This likely needs further follow-up.  Plan: Asthma exacerbation: Depo-Medrol injection today Prednisone taper Augmentin twice a day times 5 days Keep using Symbicort and Spiriva Call us if no improvement Use albuterol as needed for chest tightness wheezing or shortness of breath  Scarring seen on the chest x-ray in November 2018: We will repeat a chest x-ray today and call you with those results  Follow-up with Dr. Annamaria Boots as previously arranged    Current Outpatient Medications:  .  albuterol  (PROVENTIL) (2.5 MG/3ML) 0.083% nebulizer solution, USE ONE VIAL VIA NEBULZIER EVERY FOUR HOURS AS NEEDED FOR WHEEZING, Disp: 180 vial, Rfl: 0 .  amLODipine (NORVASC) 10 MG tablet, TAKE ONE (1) TABLET EACH DAY, Disp: 30 tablet, Rfl: 2 .  aspirin 81 MG tablet, Take 81 mg by mouth daily.  , Disp: , Rfl:  .  dabigatran (PRADAXA) 150 MG CAPS, Take 150 mg by mouth every 12 (twelve) hours., Disp: , Rfl:  .  fluticasone (FLONASE) 50 MCG/ACT nasal spray, USE TWO SPRAYS IN EACH NOSTRIL EACH DAY AS DIRECTED BY PHYSICIAN, Disp: 16 g, Rfl: 3 .  hydrochlorothiazide (HYDRODIURIL) 25 MG tablet, TAKE ONE (1) TABLET EACH DAY, Disp: 30 tablet, Rfl: 0 .  levETIRAcetam (KEPPRA) 750 MG tablet, Take 1 tablet (750 mg total) by mouth 2 (two) times daily., Disp: 60 tablet, Rfl: 11 .  losartan (COZAAR) 100 MG tablet, TAKE ONE (1) TABLET EACH DAY, Disp: 30 tablet, Rfl: 0 .  metoprolol succinate (TOPROL-XL) 50 MG 24 hr tablet, Take 1 tablet (50 mg total) by mouth 2 (two) times daily. Take with or immediately following a meal., Disp: 60 tablet, Rfl: 1 .  Omega-3 Fatty Acids (FISH OIL) 1000 MG CAPS, Take 1 capsule by mouth 2 (two) times daily.  , Disp: , Rfl:  .  phenytoin (DILANTIN) 100 MG ER capsule, TAKE 4 CAPSULES BY MOUTH AT BEDTIME, Disp: 120 capsule, Rfl: 11 .  rosuvastatin (CRESTOR) 20 MG tablet, TAKE ONE (1) TABLET EACH DAY, Disp: 30 tablet, Rfl: 2 .  sertraline (ZOLOFT) 50 MG tablet, TAKE 1 AND ONE-HALF TABLETS  BY MOUTH ONCE DAILY, Disp: 45 tablet, Rfl: 0 .  SYMBICORT 80-4.5 MCG/ACT inhaler, INHALE 2 PUFFS TWICE A DAY, Disp: 10.2 Inhaler, Rfl: 5 .  Tiotropium Bromide Monohydrate (SPIRIVA RESPIMAT) 2.5 MCG/ACT AERS, Inhale 2 puffs into the lungs daily., Disp: 1 Inhaler, Rfl: 0 .  VENTOLIN HFA 108 (90 Base) MCG/ACT inhaler, USE 2 PUFFS EVERY 4 HOURS AS DIRECTED - RESCUE, Disp: 1 Inhaler, Rfl: 6 .  amoxicillin-clavulanate (AUGMENTIN) 875-125 MG tablet, Take 1 tablet by mouth 2 (two) times daily., Disp: 10 tablet, Rfl: 0 .   predniSONE (DELTASONE) 10 MG tablet, 40mg X3 days, 30mg  X3 days, 20mg  X2 days, 10mg X2 days, then stop., Disp: 27 tablet, Rfl: 0  Current Facility-Administered Medications:  .  methylPREDNISolone acetate (DEPO-MEDROL) injection 80 mg, 80 mg, Intramuscular, Once, Juanito Doom, MD

## 2017-04-20 NOTE — Patient Instructions (Signed)
Asthma exacerbation: Depo-Medrol injection today Prednisone taper Augmentin twice a day times 5 days Keep using Symbicort and Spiriva Call us if no improvement Use albuterol as needed for chest tightness wheezing or shortness of breath  Scarring seen on the chest x-ray in November 2018: We will repeat a chest x-ray today and call you with those results  Follow-up with Dr. Annamaria Boots as previously arranged

## 2017-04-20 NOTE — Telephone Encounter (Signed)
Pt reports of prod cough with yellow mucus, wheezing, increased sob & increased fatigue x3d. Denies fever, chills, sweats or body aches. Pt is requesting acute visit with CY today. It appears that pt has been scheduled for today at 11:45.  CY please advise. Thanks  Current Outpatient Medications on File Prior to Visit  Medication Sig Dispense Refill  . albuterol (PROVENTIL) (2.5 MG/3ML) 0.083% nebulizer solution USE ONE VIAL VIA NEBULZIER EVERY FOUR HOURS AS NEEDED FOR WHEEZING 180 vial 0  . amLODipine (NORVASC) 10 MG tablet TAKE ONE (1) TABLET EACH DAY 30 tablet 2  . aspirin 81 MG tablet Take 81 mg by mouth daily.      . dabigatran (PRADAXA) 150 MG CAPS Take 150 mg by mouth every 12 (twelve) hours.    [name] EPINEPHrine (EPIPEN) 0.3 mg/0.3 mL DEVI Inject 0.3 mLs (0.3 mg total) into the muscle once. (Patient not taking: Reported on 03/25/2017) 1 Device prn  . fluticasone (FLONASE) 50 MCG/ACT nasal spray USE TWO SPRAYS IN EACH NOSTRIL EACH DAY AS DIRECTED BY PHYSICIAN 16 g 3  . hydrochlorothiazide (HYDRODIURIL) 25 MG tablet TAKE ONE (1) TABLET EACH DAY 30 tablet 0  . levETIRAcetam (KEPPRA) 750 MG tablet Take 1 tablet (750 mg total) by mouth 2 (two) times daily. 60 tablet 11  . losartan (COZAAR) 100 MG tablet TAKE ONE (1) TABLET EACH DAY 30 tablet 0  . metoprolol succinate (TOPROL-XL) 50 MG 24 hr tablet Take 1 tablet (50 mg total) by mouth 2 (two) times daily. Take with or immediately following a meal. 60 tablet 1  . Omega-3 Fatty Acids (FISH OIL) 1000 MG CAPS Take 1 capsule by mouth 2 (two) times daily.      . phenytoin (DILANTIN) 100 MG ER capsule TAKE 4 CAPSULES BY MOUTH AT BEDTIME 120 capsule 11  . rosuvastatin (CRESTOR) 20 MG tablet TAKE ONE (1) TABLET EACH DAY 30 tablet 2  . sertraline (ZOLOFT) 50 MG tablet TAKE 1 AND ONE-HALF TABLETS BY MOUTH ONCE DAILY 45 tablet 0  . SYMBICORT 80-4.5 MCG/ACT inhaler INHALE 2 PUFFS TWICE A DAY 10.2 Inhaler 5  . Tiotropium Bromide Monohydrate (SPIRIVA RESPIMAT)  2.5 MCG/ACT AERS Inhale 2 puffs into the lungs daily. 1 Inhaler 0  . VENTOLIN HFA 108 (90 Base) MCG/ACT inhaler USE 2 PUFFS EVERY 4 HOURS AS DIRECTED - RESCUE 1 Inhaler 6   No current facility-administered medications on file prior to visit.     Allergies  Allergen Reactions  . Doxycycline Diarrhea and Nausea Only  . Methotrexate Rash    Mouth broke out

## 2017-04-21 NOTE — Progress Notes (Signed)
Spoke with pt and notified of results per Dr.McQuaid. Pt verbalized understanding and denied any questions. I scheduled her with TP for 05/11/17 and advised to call sooner if needed

## 2017-04-30 ENCOUNTER — Other Ambulatory Visit: Payer: Self-pay | Admitting: Internal Medicine

## 2017-04-30 ENCOUNTER — Telehealth: Payer: Self-pay | Admitting: Internal Medicine

## 2017-04-30 ENCOUNTER — Other Ambulatory Visit: Payer: Medicare PPO

## 2017-04-30 MED ORDER — PREDNISONE 10 MG PO TABS: 10.0000 mg | ORAL_TABLET | Freq: Every day | ORAL | 0 refills | Status: AC

## 2017-04-30 MED ORDER — TRAMADOL HCL 50 MG PO TABS: ORAL_TABLET | ORAL | 0 refills | Status: DC

## 2017-04-30 NOTE — Telephone Encounter (Signed)
Offer prednsone 10 mg, # 7,    1 daily

## 2017-04-30 NOTE — Telephone Encounter (Signed)
ATC pt, no answer. Left message for pt to call back.  

## 2017-04-30 NOTE — Telephone Encounter (Signed)
Called spoke with patient Advised of CY's recommendation for 7 days of prednisone 10mg  Pt voiced her understanding Pt does still want the tramadol - advised pt this is AS NEEDED for her cough  Rx's sent to verified pharmacy Nothing further needed; will sign off

## 2017-04-30 NOTE — Telephone Encounter (Signed)
Rx printed [facility] - was unable to speak with someone, kept being routed to their voicemail Rx for Tramadol left on voicemail  Will sign off

## 2017-04-30 NOTE — Telephone Encounter (Signed)
Spoke with pt and gave recommendations from CY. She is requesting Prednisone because she states it is the only thing that helps her. CY please advise.

## 2017-04-30 NOTE — Telephone Encounter (Signed)
Spoke with Brooke Baker, she states she is not getting better. She is still coughing up white mucus. She saw Dr. [place] Bells on 4/16 and she has taken her last prednisone today. She would like something sent in for her cough. She doesn't have any other symptoms but did report she was wheezing a little bit. CY please advise.       Patient Instructions by Juanito Doom, MD at 04/20/2017 11:45 AM  Author: Juanito Doom, MD Author Type: Physician Filed: 04/20/2017 12:15 PM  Note Status: Signed Cosign: Cosign Not Required Encounter Date: 04/20/2017  Editor: Juanito Doom, MD (Physician)    Asthma exacerbation: Depo-Medrol injection today Prednisone taper Augmentin twice a day times 5 days Keep using Symbicort and Spiriva Call us if no improvement Use albuterol as needed for chest tightness wheezing or shortness of breath  Scarring seen on the chest x-ray in November 2018: We will repeat a chest x-ray today and call you with those results  Follow-up with Dr. Annamaria Boots as previously arranged     Current Outpatient Medications on File Prior to Visit  Medication Sig Dispense Refill  . albuterol (PROVENTIL) (2.5 MG/3ML) 0.083% nebulizer solution USE ONE VIAL VIA NEBULZIER EVERY FOUR HOURS AS NEEDED FOR WHEEZING 180 vial 0  . amLODipine (NORVASC) 10 MG tablet TAKE ONE (1) TABLET EACH DAY 30 tablet 2  . amoxicillin-clavulanate (AUGMENTIN) 875-125 MG tablet Take 1 tablet by mouth 2 (two) times daily. 10 tablet 0  . aspirin 81 MG tablet Take 81 mg by mouth daily.      . dabigatran (PRADAXA) 150 MG CAPS Take 150 mg by mouth every 12 (twelve) hours.    . fluticasone (FLONASE) 50 MCG/ACT nasal spray USE TWO SPRAYS IN EACH NOSTRIL EACH DAY AS DIRECTED BY PHYSICIAN 16 g 3  . hydrochlorothiazide (HYDRODIURIL) 25 MG tablet TAKE ONE (1) TABLET EACH DAY 30 tablet 0  . levETIRAcetam (KEPPRA) 750 MG tablet Take 1 tablet (750 mg total) by mouth 2 (two) times daily. 60 tablet 11  . losartan (COZAAR) 100 MG  tablet TAKE ONE (1) TABLET EACH DAY 30 tablet 0  . metoprolol succinate (TOPROL-XL) 50 MG 24 hr tablet Take 1 tablet (50 mg total) by mouth 2 (two) times daily. Take with or immediately following a meal. 60 tablet 1  . Omega-3 Fatty Acids (FISH OIL) 1000 MG CAPS Take 1 capsule by mouth 2 (two) times daily.      . phenytoin (DILANTIN) 100 MG ER capsule TAKE 4 CAPSULES BY MOUTH AT BEDTIME 120 capsule 11  . predniSONE (DELTASONE) 10 MG tablet 40mg X3 days, 30mg  X3 days, 20mg  X2 days, 10mg X2 days, then stop. 27 tablet 0  . rosuvastatin (CRESTOR) 20 MG tablet TAKE ONE (1) TABLET EACH DAY 30 tablet 2  . sertraline (ZOLOFT) 50 MG tablet TAKE 1 AND ONE-HALF TABLETS BY MOUTH ONCE DAILY 45 tablet 0  . SYMBICORT 80-4.5 MCG/ACT inhaler INHALE 2 PUFFS TWICE A DAY 10.2 Inhaler 5  . Tiotropium Bromide Monohydrate (SPIRIVA RESPIMAT) 2.5 MCG/ACT AERS Inhale 2 puffs into the lungs daily. 1 Inhaler 0  . VENTOLIN HFA 108 (90 Base) MCG/ACT inhaler USE 2 PUFFS EVERY 4 HOURS AS DIRECTED - RESCUE 1 Inhaler 6   No current facility-administered medications on file prior to visit.    Allergies  Allergen Reactions  . Doxycycline Diarrhea and Nausea Only  . Methotrexate Rash    Mouth broke out

## 2017-04-30 NOTE — Telephone Encounter (Signed)
I would like her to use her nebulizer with albuterol twice daily for the next 3 days. Ok to keep on doing this if it helps.  Ok to Rx Tramadol 50 mg, # 30,   1 or 2 every 8 hours if needed for cough

## 2017-05-03 ENCOUNTER — Other Ambulatory Visit: Payer: Medicare PPO

## 2017-05-03 ENCOUNTER — Other Ambulatory Visit: Payer: Self-pay | Admitting: Radiology

## 2017-05-03 DIAGNOSIS — I10 Essential (primary) hypertension: Secondary | ICD-10-CM

## 2017-05-03 DIAGNOSIS — E1140 Type 2 diabetes mellitus with diabetic neuropathy, unspecified: Secondary | ICD-10-CM

## 2017-05-03 DIAGNOSIS — E7800 Pure hypercholesterolemia, unspecified: Secondary | ICD-10-CM

## 2017-05-03 NOTE — Addendum Note (Signed)
Addended by: Leeanne Rio on: 05/03/2017 10:06 AM   Modules accepted: Orders

## 2017-05-05 ENCOUNTER — Other Ambulatory Visit: Payer: Self-pay | Admitting: Internal Medicine

## 2017-05-05 ENCOUNTER — Other Ambulatory Visit: Payer: Medicare PPO

## 2017-05-05 ENCOUNTER — Other Ambulatory Visit (INDEPENDENT_AMBULATORY_CARE_PROVIDER_SITE_OTHER): Payer: Medicare PPO

## 2017-05-05 DIAGNOSIS — E1140 Type 2 diabetes mellitus with diabetic neuropathy, unspecified: Secondary | ICD-10-CM | POA: Diagnosis not present

## 2017-05-05 DIAGNOSIS — D696 Thrombocytopenia, unspecified: Secondary | ICD-10-CM

## 2017-05-05 DIAGNOSIS — I10 Essential (primary) hypertension: Secondary | ICD-10-CM | POA: Diagnosis not present

## 2017-05-05 DIAGNOSIS — E871 Hypo-osmolality and hyponatremia: Secondary | ICD-10-CM

## 2017-05-05 DIAGNOSIS — E7800 Pure hypercholesterolemia, unspecified: Secondary | ICD-10-CM

## 2017-05-05 LAB — HEPATIC FUNCTION PANEL
ALBUMIN: 3.2 g/dL — AB (ref 3.5–5.2)
ALT: 17 U/L (ref 0–35)
AST: 29 U/L (ref 0–37)
Alkaline Phosphatase: 93 U/L (ref 39–117)
Bilirubin, Direct: 0.1 mg/dL (ref 0.0–0.3)
TOTAL PROTEIN: 6.7 g/dL (ref 6.0–8.3)
Total Bilirubin: 0.4 mg/dL (ref 0.2–1.2)

## 2017-05-05 LAB — CBC
HEMATOCRIT: 35.3 % (ref 35.0–45.0)
Hemoglobin: 12.2 g/dL (ref 11.7–15.5)
MCH: 34 pg — ABNORMAL HIGH (ref 27.0–33.0)
MCHC: 34.6 g/dL (ref 32.0–36.0)
MCV: 98.3 fL (ref 80.0–100.0)
MPV: 10.2 fL (ref 7.5–12.5)
PLATELETS: 111 10*3/uL — AB (ref 140–400)
RBC: 3.59 10*6/uL — ABNORMAL LOW (ref 3.80–5.10)
RDW: 11.9 % (ref 11.0–15.0)
WBC: 5.1 10*3/uL (ref 3.8–10.8)

## 2017-05-05 LAB — LIPID PANEL
Cholesterol: 116 mg/dL (ref 0–200)
HDL: 54.6 mg/dL (ref >39.00)
LDL Cholesterol: 51 mg/dL (ref 0–99)
NonHDL: 61.68
TRIGLYCERIDES: 52 mg/dL (ref 0.0–149.0)
Total CHOL/HDL Ratio: 2
VLDL: 10.4 mg/dL (ref 0.0–40.0)

## 2017-05-05 LAB — BASIC METABOLIC PANEL
BUN: 34 mg/dL — ABNORMAL HIGH (ref 6–23)
CALCIUM: 8.6 mg/dL (ref 8.4–10.5)
CO2: 18 meq/L — AB (ref 19–32)
CREATININE: 1.01 mg/dL (ref 0.40–1.20)
Chloride: 104 mEq/L (ref 96–112)
GFR: 55.93 mL/min — ABNORMAL LOW (ref >60.00)
GLUCOSE: 93 mg/dL (ref 70–99)
Potassium: 5.1 mEq/L (ref 3.5–5.1)
Sodium: 134 mEq/L — ABNORMAL LOW (ref 135–145)

## 2017-05-05 LAB — TSH: TSH: 2.94 u[IU]/mL (ref 0.35–4.50)

## 2017-05-05 LAB — POCT GLYCOSYLATED HEMOGLOBIN (HGB A1C): Hemoglobin A1C: 5.7

## 2017-05-05 NOTE — Addendum Note (Signed)
Addended by: Arby Barrette on: 05/05/2017 09:55 AM   Modules accepted: Orders

## 2017-05-05 NOTE — Progress Notes (Signed)
Order placed for f/u labs.  

## 2017-05-11 ENCOUNTER — Ambulatory Visit: Payer: Medicare PPO | Admitting: Pulmonary Disease

## 2017-05-11 ENCOUNTER — Ambulatory Visit
Admission: RE | Admit: 2017-05-11 | Discharge: 2017-05-11 | Disposition: A | Discharge status: Home / Self Care | Payer: Medicare PPO | Source: Ambulatory Visit | Attending: Internal Medicine | Admitting: Internal Medicine

## 2017-05-11 DIAGNOSIS — Z1231 Encounter for screening mammogram for malignant neoplasm of breast: Secondary | ICD-10-CM | POA: Diagnosis not present

## 2017-05-20 ENCOUNTER — Other Ambulatory Visit: Payer: Medicare PPO

## 2017-05-24 ENCOUNTER — Other Ambulatory Visit (INDEPENDENT_AMBULATORY_CARE_PROVIDER_SITE_OTHER): Payer: Medicare PPO

## 2017-05-24 DIAGNOSIS — D696 Thrombocytopenia, unspecified: Secondary | ICD-10-CM | POA: Diagnosis not present

## 2017-05-24 DIAGNOSIS — E871 Hypo-osmolality and hyponatremia: Secondary | ICD-10-CM | POA: Diagnosis not present

## 2017-05-24 NOTE — Addendum Note (Signed)
Addended by: Arby Barrette on: 05/24/2017 01:52 PM   Modules accepted: Orders

## 2017-05-25 ENCOUNTER — Ambulatory Visit: Payer: Medicare PPO | Admitting: Adult Health

## 2017-05-25 ENCOUNTER — Other Ambulatory Visit: Payer: Self-pay | Admitting: Internal Medicine

## 2017-05-25 DIAGNOSIS — E871 Hypo-osmolality and hyponatremia: Secondary | ICD-10-CM

## 2017-05-25 DIAGNOSIS — R7989 Other specified abnormal findings of blood chemistry: Secondary | ICD-10-CM

## 2017-05-25 LAB — BASIC METABOLIC PANEL
BUN/Creatinine Ratio: 22 (calc) (ref 6–22)
BUN: 25 mg/dL (ref 7–25)
CALCIUM: 8.4 mg/dL — AB (ref 8.6–10.4)
CO2: 19 mmol/L — ABNORMAL LOW (ref 20–32)
Chloride: 102 mmol/L (ref 98–110)
Creat: 1.15 mg/dL — ABNORMAL HIGH (ref 0.60–0.88)
GLUCOSE: 121 mg/dL — AB (ref 65–99)
Potassium: 5.2 mmol/L (ref 3.5–5.3)
Sodium: 131 mmol/L — ABNORMAL LOW (ref 135–146)

## 2017-05-25 LAB — PLATELET COUNT: Platelets: 135 10*3/uL — ABNORMAL LOW (ref 140–400)

## 2017-05-25 NOTE — Progress Notes (Signed)
Order placed for f/u lab.   

## 2017-05-29 ENCOUNTER — Other Ambulatory Visit: Payer: Self-pay | Admitting: Internal Medicine

## 2017-06-02 ENCOUNTER — Other Ambulatory Visit (INDEPENDENT_AMBULATORY_CARE_PROVIDER_SITE_OTHER): Payer: Medicare PPO

## 2017-06-02 DIAGNOSIS — R7989 Other specified abnormal findings of blood chemistry: Secondary | ICD-10-CM | POA: Diagnosis not present

## 2017-06-02 DIAGNOSIS — E871 Hypo-osmolality and hyponatremia: Secondary | ICD-10-CM

## 2017-06-02 LAB — BASIC METABOLIC PANEL
BUN: 21 mg/dL (ref 6–23)
CHLORIDE: 100 meq/L (ref 96–112)
CO2: 24 meq/L (ref 19–32)
CREATININE: 0.9 mg/dL (ref 0.40–1.20)
Calcium: 8.4 mg/dL (ref 8.4–10.5)
GFR: 63.88 mL/min (ref >60.00)
Glucose, Bld: 147 mg/dL — ABNORMAL HIGH (ref 70–99)
Potassium: 4.7 mEq/L (ref 3.5–5.1)
Sodium: 130 mEq/L — ABNORMAL LOW (ref 135–145)

## 2017-06-03 ENCOUNTER — Other Ambulatory Visit: Payer: Self-pay | Admitting: Internal Medicine

## 2017-06-03 DIAGNOSIS — E871 Hypo-osmolality and hyponatremia: Secondary | ICD-10-CM

## 2017-06-03 NOTE — Progress Notes (Signed)
Order placed for f/u sodium.  ?

## 2017-06-11 ENCOUNTER — Other Ambulatory Visit: Payer: Medicare PPO

## 2017-06-14 ENCOUNTER — Other Ambulatory Visit (INDEPENDENT_AMBULATORY_CARE_PROVIDER_SITE_OTHER): Payer: Medicare PPO

## 2017-06-14 DIAGNOSIS — E871 Hypo-osmolality and hyponatremia: Secondary | ICD-10-CM | POA: Diagnosis not present

## 2017-06-14 LAB — SODIUM: SODIUM: 133 meq/L — AB (ref 135–145)

## 2017-06-14 NOTE — Addendum Note (Signed)
Addended by: Leeanne Rio on: 06/14/2017 02:01 PM   Modules accepted: Orders

## 2017-06-15 DIAGNOSIS — I481 Persistent atrial fibrillation: Secondary | ICD-10-CM | POA: Diagnosis not present

## 2017-06-15 DIAGNOSIS — Z8673 Personal history of transient ischemic attack (TIA), and cerebral infarction without residual deficits: Secondary | ICD-10-CM | POA: Diagnosis not present

## 2017-06-15 DIAGNOSIS — E785 Hyperlipidemia, unspecified: Secondary | ICD-10-CM | POA: Diagnosis not present

## 2017-06-15 DIAGNOSIS — I639 Cerebral infarction, unspecified: Secondary | ICD-10-CM | POA: Diagnosis not present

## 2017-06-15 DIAGNOSIS — J449 Chronic obstructive pulmonary disease, unspecified: Secondary | ICD-10-CM | POA: Diagnosis not present

## 2017-06-15 DIAGNOSIS — I2510 Atherosclerotic heart disease of native coronary artery without angina pectoris: Secondary | ICD-10-CM | POA: Diagnosis not present

## 2017-06-15 DIAGNOSIS — I10 Essential (primary) hypertension: Secondary | ICD-10-CM | POA: Diagnosis not present

## 2017-06-15 DIAGNOSIS — F439 Reaction to severe stress, unspecified: Secondary | ICD-10-CM | POA: Diagnosis not present

## 2017-06-15 DIAGNOSIS — R0602 Shortness of breath: Secondary | ICD-10-CM | POA: Diagnosis not present

## 2017-06-16 ENCOUNTER — Other Ambulatory Visit: Payer: Self-pay | Admitting: Internal Medicine

## 2017-06-21 ENCOUNTER — Other Ambulatory Visit: Payer: Self-pay | Admitting: Internal Medicine

## 2017-06-29 DIAGNOSIS — I481 Persistent atrial fibrillation: Secondary | ICD-10-CM | POA: Diagnosis not present

## 2017-06-29 DIAGNOSIS — Z9889 Other specified postprocedural states: Secondary | ICD-10-CM | POA: Diagnosis not present

## 2017-06-29 DIAGNOSIS — J449 Chronic obstructive pulmonary disease, unspecified: Secondary | ICD-10-CM | POA: Diagnosis not present

## 2017-06-29 DIAGNOSIS — R0602 Shortness of breath: Secondary | ICD-10-CM | POA: Diagnosis not present

## 2017-06-29 DIAGNOSIS — I639 Cerebral infarction, unspecified: Secondary | ICD-10-CM | POA: Diagnosis not present

## 2017-06-29 DIAGNOSIS — I2510 Atherosclerotic heart disease of native coronary artery without angina pectoris: Secondary | ICD-10-CM | POA: Diagnosis not present

## 2017-06-29 DIAGNOSIS — I10 Essential (primary) hypertension: Secondary | ICD-10-CM | POA: Diagnosis not present

## 2017-06-29 DIAGNOSIS — E785 Hyperlipidemia, unspecified: Secondary | ICD-10-CM | POA: Diagnosis not present

## 2017-07-01 ENCOUNTER — Ambulatory Visit (INDEPENDENT_AMBULATORY_CARE_PROVIDER_SITE_OTHER): Payer: Medicare PPO | Admitting: Internal Medicine

## 2017-07-01 ENCOUNTER — Encounter: Payer: Self-pay | Admitting: Internal Medicine

## 2017-07-01 VITALS — BP 132/70 | HR 100 | Temp 97.5°F | Resp 18 | Wt 243.8 lb

## 2017-07-01 DIAGNOSIS — G40909 Epilepsy, unspecified, not intractable, without status epilepticus: Secondary | ICD-10-CM | POA: Diagnosis not present

## 2017-07-01 DIAGNOSIS — R0902 Hypoxemia: Secondary | ICD-10-CM

## 2017-07-01 DIAGNOSIS — E7800 Pure hypercholesterolemia, unspecified: Secondary | ICD-10-CM

## 2017-07-01 DIAGNOSIS — E1140 Type 2 diabetes mellitus with diabetic neuropathy, unspecified: Secondary | ICD-10-CM

## 2017-07-01 DIAGNOSIS — I4891 Unspecified atrial fibrillation: Secondary | ICD-10-CM

## 2017-07-01 DIAGNOSIS — R0602 Shortness of breath: Secondary | ICD-10-CM | POA: Diagnosis not present

## 2017-07-01 DIAGNOSIS — I509 Heart failure, unspecified: Secondary | ICD-10-CM | POA: Diagnosis not present

## 2017-07-01 DIAGNOSIS — J4540 Moderate persistent asthma, uncomplicated: Secondary | ICD-10-CM | POA: Diagnosis not present

## 2017-07-01 DIAGNOSIS — I5032 Chronic diastolic (congestive) heart failure: Secondary | ICD-10-CM | POA: Insufficient documentation

## 2017-07-01 DIAGNOSIS — I10 Essential (primary) hypertension: Secondary | ICD-10-CM | POA: Diagnosis not present

## 2017-07-01 NOTE — Progress Notes (Addendum)
Patient ID: Brooke Baker, female   DOB: 08/10/1934, 83 y.o.   MRN: [id number]   Subjective:    Patient ID: Brooke Baker, female    DOB: July 06, 1934, 83 y.o.   MRN: [id number]  HPI  Patient here for a scheduled follow up.  She has been seen recently by cardiology.  Was initially seen 06/15/17 for increased lower extremity edema and sob with exertion.  Was initially placed on lasix 96m q day.  Was reevaluated 06/29/17 and noted to have increased weight gain of 12 pounds.  Lasix increased to 461mq day.  Just started yesterday.  She does report feeling better, but states that when she tries to work around her house, her heart rate increases and her oxygen sats decrease.  Recently had lexiscan and echo.  lexiscan revealed no ischemia. ECHO - EF 50% with moderate TR and mild pulmonary hypertension.  She denies any chest pain.  No acid reflux.  No vomiting.  No abdominal pain.  No increased cough or congestion.  States does not feel like her typical asthma flare.     Past Medical History:  Diagnosis Date  . Acute bronchitis   . Allergic rhinitis   . Arthritis   . CAD (coronary artery disease)    s/p stent LAD 8/03 and stent placement OM1  . Cancer (H[facility]   skin  . Chronic back pain   . COPD with asthma ([place]  . CVA (cerebral vascular accident) ([facility]  . Diabetes mellitus ([facility]  . Hypercholesterolemia   . Hypertension   . [facility]   Past Surgical History:  Procedure Laterality Date  . BREAST BIOPSY Bilateral    ax[place]  . CORONARY STENT PLACEMENT  8/03   LAD  . CORONARY STENT PLACEMENT     OM1  2006  . FEMUR FRACTURE SURGERY Right [id number] Dr. HoMarry Guan. LUMBAR SPINE SURGERY     x3  . TOTAL ABDOMINAL HYSTERECTOMY     appendectomy - age 83. TOTAL HIP ARTHROPLASTY    . TOTAL KNEE ARTHROPLASTY     2005   Family History  Problem Relation Age of Onset  . Allergies Brother   . Cancer Brother   . Allergies Sister   . Cancer Sister        Non-hodgkins  lymphoma  . Asthma Sister   . Rheum arthritis Sister   . Stroke Mother   . Asthma Brother   . Rheum arthritis Brother   . Cancer Sister        non-hodgkins lymphoma  . Diabetes Brother    Social History   Socioeconomic History  . Marital status: Married    Spouse name: Not on file  . Number of children: 3  . Years of education: hs  . Highest education level: Not on file  Occupational History  . Occupation: retired miPsychologist, sport and exerciseRETIRED  Social Needs  . Financial resource strain: Not hard at all  . Food insecurity:    Worry: Never true    Inability: Never true  . Transportation needs:    Medical: No    Non-medical: No  Tobacco Use  . Smoking status: Never Smoker  . Smokeless tobacco: Never Used  Substance and Sexual Activity  . Alcohol use: No    Alcohol/week: 0.0 oz  . Drug use: No  . Sexual activity: Never  Lifestyle  . Physical activity:    Days per  week: 0 days    Minutes per session: Not on file  . Stress: Not at all  Relationships  . Social connections:    Talks on phone: Not on file    Gets together: Not on file    Attends religious service: Not on file    Active member of club or organization: Not on file    Attends meetings of clubs or organizations: Not on file    Relationship status: Not on file  Other Topics Concern  . Not on file  Social History Narrative   lives with husband- Edsel    Outpatient Encounter Medications as of 07/01/2017  Medication Sig  . albuterol (PROVENTIL) (2.5 MG/3ML) 0.083% nebulizer solution USE ONE VIAL VIA NEBULZIER EVERY FOUR HOURS AS NEEDED FOR WHEEZING  . amLODipine (NORVASC) 10 MG tablet TAKE ONE (1) TABLET EACH DAY  . aspirin 81 MG tablet Take 81 mg by mouth daily.    . dabigatran (PRADAXA) 150 MG CAPS Take 150 mg by mouth every 12 (twelve) hours.  . fluticasone (FLONASE) 50 MCG/ACT nasal spray USE TWO SPRAYS IN EACH NOSTRIL EACH DAY AS DIRECTED BY PHYSICIAN  . furosemide (LASIX) 40 MG tablet Take 1  tablet by mouth daily.  [name] levETIRAcetam (KEPPRA) 750 MG tablet Take 1 tablet (750 mg total) by mouth 2 (two) times daily.  [name] losartan (COZAAR) 100 MG tablet TAKE ONE (1) TABLET EACH DAY  . metoprolol succinate (TOPROL-XL) 50 MG 24 hr tablet Take 1 tablet (50 mg total) by mouth 2 (two) times daily. Take with or immediately following a meal.  . Omega-3 Fatty Acids (FISH OIL) 1000 MG CAPS Take 1 capsule by mouth 2 (two) times daily.    . phenytoin (DILANTIN) 100 MG ER capsule TAKE 4 CAPSULES BY MOUTH AT BEDTIME  . rosuvastatin (CRESTOR) 20 MG tablet TAKE ONE (1) TABLET EACH DAY  . sertraline (ZOLOFT) 50 MG tablet TAKE 1 AND ONE-HALF TABLETS BY MOUTH ONCE DAILY  . SYMBICORT 80-4.5 MCG/ACT inhaler INHALE 2 PUFFS TWICE A DAY  . Tiotropium Bromide Monohydrate (SPIRIVA RESPIMAT) 2.5 MCG/ACT AERS Inhale 2 puffs into the lungs daily.  . traMADol (ULTRAM) 50 MG tablet 1 or 2 every 8 hours if needed for cough  . VENTOLIN HFA 108 (90 Base) MCG/ACT inhaler USE 2 PUFFS EVERY 4 HOURS AS DIRECTED - RESCUE  . [DISCONTINUED] amoxicillin-clavulanate (AUGMENTIN) 875-125 MG tablet Take 1 tablet by mouth 2 (two) times daily.  . [DISCONTINUED] hydrochlorothiazide (HYDRODIURIL) 25 MG tablet TAKE ONE (1) TABLET EACH DAY  . [DISCONTINUED] predniSONE (DELTASONE) 10 MG tablet 72mX3 days, 348mX3 days, 2072m2 days, 84m14mdays, then stop.   No facility-administered encounter medications on file as of 07/01/2017.     Review of Systems  Constitutional: Negative for appetite change.       Weight gain as outlined.    HENT: Negative for congestion and sinus pressure.   Respiratory: Negative for chest tightness.        No increase cough or congestion.  SOB with exertion.   Cardiovascular: Negative for chest pain.       Increased heart rate as outlined.   Gastrointestinal: Negative for abdominal pain, diarrhea and vomiting.  Musculoskeletal: Negative for joint swelling and myalgias.  Skin: Negative for color change and  rash.  Neurological: Negative for dizziness, light-headedness and headaches.  Psychiatric/Behavioral: Negative for agitation and dysphoric mood.       Objective:     Blood pressure rechecked by me:  130/78  Physical Exam  Constitutional: She appears well-developed and well-nourished. No distress.  HENT:  Nose: Nose normal.  Mouth/Throat: Oropharynx is clear and moist.  Neck: Neck supple. No thyromegaly present.  Cardiovascular: Normal rate and regular rhythm.  Pulmonary/Chest: Breath sounds normal. She has no wheezes.  Abdominal: Soft. Bowel sounds are normal. There is no tenderness.  Musculoskeletal: She exhibits no tenderness.  Some lower extremity swelling, but no increase.    Lymphadenopathy:    She has no cervical adenopathy.  Skin: No rash noted. No erythema.  Psychiatric: She has a normal mood and affect. Her behavior is normal.    BP 132/70 (BP Location: Left Arm, Patient Position: Sitting, Cuff Size: Large)   Pulse 100   Temp (!) 97.5 F (36.4 C) (Oral)   Resp 18   Wt 243 lb 12.8 oz (110.6 kg)   LMP 08/17/1965   SpO2 94%   BMI 39.35 kg/m  Wt Readings from Last 3 Encounters:  07/01/17 243 lb 12.8 oz (110.6 kg)  04/20/17 232 lb (105.2 kg)  03/25/17 236 lb 9.6 oz (107.3 kg)     Lab Results  Component Value Date   WBC 5.1 05/05/2017   HGB 12.2 05/05/2017   HCT 35.3 05/05/2017   PLT 135 (L) 05/24/2017   GLUCOSE 147 (H) 06/02/2017   CHOL 116 05/05/2017   TRIG 52.0 05/05/2017   HDL 54.60 05/05/2017   LDLCALC 51 05/05/2017   ALT 17 05/05/2017   AST 29 05/05/2017   NA 133 (L) 06/14/2017   K 4.7 06/02/2017   CL 100 06/02/2017   CREATININE 0.90 06/02/2017   BUN 21 06/02/2017   CO2 24 06/02/2017   TSH 2.94 05/05/2017   INR 1.3 05/12/2012   HGBA1C 5.7 05/05/2017   MICROALBUR 0.8 06/26/2016    Mm 3d Screen Breast Bilateral  Result Date: 05/14/2017 CLINICAL DATA:  Screening. EXAM: DIGITAL SCREENING BILATERAL MAMMOGRAM WITH TOMO AND CAD COMPARISON:   Previous exam(s). ACR Breast Density Category b: There are scattered areas of fibroglandular density. FINDINGS: There are no findings suspicious for malignancy. Images were processed with CAD. IMPRESSION: No mammographic evidence of malignancy. A result letter of this screening mammogram will be mailed directly to the patient. RECOMMENDATION: Screening mammogram in one year. (Code:SM-B-01Y) BI-RADS CATEGORY  1: Negative. Electronically Signed   By: Dorise Bullion III M.D   On: 05/14/2017 16:32       Assessment & Plan:   Problem List Items Addressed This Visit    Asthma, moderate persistent    No increased cough or congestion.  Continue inhalers.  Treat heart issues as outlined.        Atrial fibrillation ([place])    Has previously had controlled ventricular rate.  Heart rate increased recently with increased activity and exertion.  When ambulating and working around her house, noted to have decreased oxygen saturation and increased heart rate.  Continue diuresis.  She also qualifies for oxygen.  Start oxygen.  Discussed with cardiology.  Will f/u next week with her cardiologist.  Had echo and stress test - 08/2016.  Question of need for further testing, i.e., monitor, etc.        Relevant Medications   furosemide (LASIX) 40 MG tablet   CHF (congestive heart failure) ([place])    Per note, mild CHF noted on cxr.  Lasix just increased.  Found to have decreased O2 saturations with ambulation as outlined.  O2 dropped to 87% with minimal exertion.  Will place on O2. Continue with increased  lasix.  Check met b.  Contact cardiology for f/u.  Recent echo and stress test as outlined.  See if heart rate better controlled with oxygen supplementation.  Continue current medication regimen.        Relevant Medications   furosemide (LASIX) 40 MG tablet   Diabetes mellitus ([place])    Follow met b and a1c.        Hypercholesterolemia    On crestor.  Low cholesterol diet and exercise.  Follow lipid panel and liver  function tests.        Relevant Medications   furosemide (LASIX) 40 MG tablet   Hypertension    Blood pressure under good control.  Continue same medication regimen.  Follow pressures.  Follow metabolic panel.        Relevant Medications   furosemide (LASIX) 40 MG tablet   Hypoxia    Desaturations as outlined.  Noted with increased ambulation.   Place on O2.  Continue diuresis.  F/u with cardiology as outlined.        Seizure disorder ([place])    Followed by neurology.  No recent seizures.         Other Visit Diagnoses    SOB (shortness of breath)    -  Primary   Relevant Orders   Basic metabolic panel      I spent 40 minutes with the patient and more than 50% of the time was spent in consultation regarding the above.  Time spent discussing her current symptoms and problems.  Time also spent discussed treatment and further need for evaluation.     Einar Pheasant, MD

## 2017-07-01 NOTE — Progress Notes (Signed)
Faxed order for oxygen to [facility] on 06/30/17 Faxed STAT and called notified Chartered certified accountant order needed ASAP.

## 2017-07-01 NOTE — Assessment & Plan Note (Addendum)
Per note, mild CHF noted on cxr.  Lasix just increased.  Found to have decreased O2 saturations with ambulation as outlined.  O2 dropped to 87% with minimal exertion.  Will place on O2. Continue with increased lasix.  Check met b.  Contact cardiology for f/u.  Recent echo and stress test as outlined.  See if heart rate better controlled with oxygen supplementation.  Continue current medication regimen.

## 2017-07-01 NOTE — Assessment & Plan Note (Addendum)
Desaturations as outlined.  Noted with increased ambulation.   Place on O2.  Continue diuresis.  F/u with cardiology as outlined.

## 2017-07-01 NOTE — Assessment & Plan Note (Signed)
No increased cough or congestion.  Continue inhalers.  Treat heart issues as outlined.

## 2017-07-02 ENCOUNTER — Other Ambulatory Visit: Payer: Self-pay

## 2017-07-02 ENCOUNTER — Encounter: Payer: Self-pay | Admitting: Internal Medicine

## 2017-07-02 DIAGNOSIS — J449 Chronic obstructive pulmonary disease, unspecified: Secondary | ICD-10-CM | POA: Diagnosis not present

## 2017-07-02 LAB — BASIC METABOLIC PANEL
BUN: 16 mg/dL (ref 6–23)
CALCIUM: 8.4 mg/dL (ref 8.4–10.5)
CO2: 25 mEq/L (ref 19–32)
CREATININE: 0.91 mg/dL (ref 0.40–1.20)
Chloride: 106 mEq/L (ref 96–112)
GFR: 63.06 mL/min (ref >60.00)
Glucose, Bld: 133 mg/dL — ABNORMAL HIGH (ref 70–99)
Potassium: 3.4 mEq/L — ABNORMAL LOW (ref 3.5–5.1)
SODIUM: 141 meq/L (ref 135–145)

## 2017-07-02 MED ORDER — POTASSIUM CHLORIDE ER 10 MEQ PO TBCR: 10.0000 meq | EXTENDED_RELEASE_TABLET | Freq: Every day | ORAL | 0 refills | Status: DC

## 2017-07-02 NOTE — Assessment & Plan Note (Signed)
Blood pressure under good control.  Continue same medication regimen.  Follow pressures.  Follow metabolic panel.   

## 2017-07-02 NOTE — Assessment & Plan Note (Signed)
Has previously had controlled ventricular rate.  Heart rate increased recently with increased activity and exertion.  When ambulating and working around her house, noted to have decreased oxygen saturation and increased heart rate.  Continue diuresis.  She also qualifies for oxygen.  Start oxygen.  Discussed with cardiology.  Will f/u next week with her cardiologist.  Had echo and stress test - 08/2016.  Question of need for further testing, i.e., monitor, etc.

## 2017-07-02 NOTE — Assessment & Plan Note (Signed)
On crestor.  Low cholesterol diet and exercise.  Follow lipid panel and liver function tests.   

## 2017-07-02 NOTE — Assessment & Plan Note (Signed)
Followed by neurology.  No recent seizures.

## 2017-07-02 NOTE — Assessment & Plan Note (Signed)
Follow met b and a1c.

## 2017-07-03 DIAGNOSIS — Z96651 Presence of right artificial knee joint: Secondary | ICD-10-CM | POA: Diagnosis not present

## 2017-07-03 DIAGNOSIS — R531 Weakness: Secondary | ICD-10-CM | POA: Diagnosis not present

## 2017-07-03 DIAGNOSIS — I10 Essential (primary) hypertension: Secondary | ICD-10-CM | POA: Diagnosis not present

## 2017-07-03 DIAGNOSIS — Z96641 Presence of right artificial hip joint: Secondary | ICD-10-CM | POA: Diagnosis not present

## 2017-07-03 DIAGNOSIS — I69398 Other sequelae of cerebral infarction: Secondary | ICD-10-CM | POA: Diagnosis not present

## 2017-07-03 DIAGNOSIS — E669 Obesity, unspecified: Secondary | ICD-10-CM | POA: Diagnosis not present

## 2017-07-03 DIAGNOSIS — E785 Hyperlipidemia, unspecified: Secondary | ICD-10-CM | POA: Diagnosis not present

## 2017-07-03 DIAGNOSIS — I482 Chronic atrial fibrillation: Secondary | ICD-10-CM | POA: Diagnosis not present

## 2017-07-03 DIAGNOSIS — Z6839 Body mass index (BMI) 39.0-39.9, adult: Secondary | ICD-10-CM | POA: Diagnosis not present

## 2017-07-05 ENCOUNTER — Telehealth: Payer: Self-pay | Admitting: *Deleted

## 2017-07-05 ENCOUNTER — Other Ambulatory Visit: Payer: Self-pay | Admitting: Internal Medicine

## 2017-07-05 DIAGNOSIS — D696 Thrombocytopenia, unspecified: Secondary | ICD-10-CM

## 2017-07-05 DIAGNOSIS — I509 Heart failure, unspecified: Secondary | ICD-10-CM

## 2017-07-05 NOTE — Telephone Encounter (Signed)
Orders placed for f/u labs.  

## 2017-07-05 NOTE — Progress Notes (Signed)
Orders placed for f/u labs.  

## 2017-07-05 NOTE — Telephone Encounter (Signed)
Please place future lab orders for lab appt on 07/09/17.

## 2017-07-06 DIAGNOSIS — Z9889 Other specified postprocedural states: Secondary | ICD-10-CM | POA: Diagnosis not present

## 2017-07-06 DIAGNOSIS — J811 Chronic pulmonary edema: Secondary | ICD-10-CM | POA: Diagnosis not present

## 2017-07-06 DIAGNOSIS — J4540 Moderate persistent asthma, uncomplicated: Secondary | ICD-10-CM | POA: Diagnosis not present

## 2017-07-06 DIAGNOSIS — R609 Edema, unspecified: Secondary | ICD-10-CM | POA: Diagnosis not present

## 2017-07-06 DIAGNOSIS — I481 Persistent atrial fibrillation: Secondary | ICD-10-CM | POA: Diagnosis not present

## 2017-07-06 DIAGNOSIS — I2510 Atherosclerotic heart disease of native coronary artery without angina pectoris: Secondary | ICD-10-CM | POA: Diagnosis not present

## 2017-07-06 DIAGNOSIS — E785 Hyperlipidemia, unspecified: Secondary | ICD-10-CM | POA: Diagnosis not present

## 2017-07-06 DIAGNOSIS — R0602 Shortness of breath: Secondary | ICD-10-CM | POA: Diagnosis not present

## 2017-07-06 DIAGNOSIS — I10 Essential (primary) hypertension: Secondary | ICD-10-CM | POA: Diagnosis not present

## 2017-07-09 ENCOUNTER — Other Ambulatory Visit (INDEPENDENT_AMBULATORY_CARE_PROVIDER_SITE_OTHER): Payer: Medicare PPO

## 2017-07-09 DIAGNOSIS — I509 Heart failure, unspecified: Secondary | ICD-10-CM | POA: Diagnosis not present

## 2017-07-09 DIAGNOSIS — D696 Thrombocytopenia, unspecified: Secondary | ICD-10-CM

## 2017-07-09 LAB — BASIC METABOLIC PANEL
BUN: 20 mg/dL (ref 6–23)
CALCIUM: 8.1 mg/dL — AB (ref 8.4–10.5)
CHLORIDE: 107 meq/L (ref 96–112)
CO2: 24 mEq/L (ref 19–32)
CREATININE: 0.98 mg/dL (ref 0.40–1.20)
GFR: 57.89 mL/min — AB (ref >60.00)
Glucose, Bld: 107 mg/dL — ABNORMAL HIGH (ref 70–99)
Potassium: 4.6 mEq/L (ref 3.5–5.1)
Sodium: 140 mEq/L (ref 135–145)

## 2017-07-09 NOTE — Addendum Note (Signed)
Addended by: Arby Barrette on: 07/09/2017 02:00 PM   Modules accepted: Orders

## 2017-07-10 LAB — CBC WITH DIFFERENTIAL/PLATELET
BASOS ABS: 49 {cells}/uL (ref 0–200)
Basophils Relative: 0.9 %
EOS PCT: 16.5 %
Eosinophils Absolute: 891 cells/uL — ABNORMAL HIGH (ref 15–500)
HCT: 35 % (ref 35.0–45.0)
Hemoglobin: 12.2 g/dL (ref 11.7–15.5)
Lymphs Abs: 945 cells/uL (ref 850–3900)
MCH: 34.7 pg — ABNORMAL HIGH (ref 27.0–33.0)
MCHC: 34.9 g/dL (ref 32.0–36.0)
MCV: 99.4 fL (ref 80.0–100.0)
MONOS PCT: 14.9 %
MPV: 10.7 fL (ref 7.5–12.5)
NEUTROS ABS: 2711 {cells}/uL (ref 1500–7800)
NEUTROS PCT: 50.2 %
Platelets: 162 10*3/uL (ref 140–400)
RBC: 3.52 10*6/uL — ABNORMAL LOW (ref 3.80–5.10)
RDW: 12.1 % (ref 11.0–15.0)
Total Lymphocyte: 17.5 %
WBC mixed population: 805 cells/uL (ref 200–950)
WBC: 5.4 10*3/uL (ref 3.8–10.8)

## 2017-07-12 ENCOUNTER — Other Ambulatory Visit: Payer: Self-pay | Admitting: Internal Medicine

## 2017-07-20 DIAGNOSIS — R609 Edema, unspecified: Secondary | ICD-10-CM | POA: Diagnosis not present

## 2017-07-20 DIAGNOSIS — I10 Essential (primary) hypertension: Secondary | ICD-10-CM | POA: Diagnosis not present

## 2017-07-20 DIAGNOSIS — E785 Hyperlipidemia, unspecified: Secondary | ICD-10-CM | POA: Diagnosis not present

## 2017-07-20 DIAGNOSIS — I2510 Atherosclerotic heart disease of native coronary artery without angina pectoris: Secondary | ICD-10-CM | POA: Diagnosis not present

## 2017-07-20 DIAGNOSIS — I481 Persistent atrial fibrillation: Secondary | ICD-10-CM | POA: Diagnosis not present

## 2017-07-24 ENCOUNTER — Other Ambulatory Visit: Payer: Self-pay | Admitting: Internal Medicine

## 2017-07-31 ENCOUNTER — Other Ambulatory Visit: Payer: Self-pay | Admitting: Internal Medicine

## 2017-08-01 DIAGNOSIS — J449 Chronic obstructive pulmonary disease, unspecified: Secondary | ICD-10-CM | POA: Diagnosis not present

## 2017-08-04 DIAGNOSIS — E785 Hyperlipidemia, unspecified: Secondary | ICD-10-CM | POA: Diagnosis not present

## 2017-08-04 DIAGNOSIS — R0602 Shortness of breath: Secondary | ICD-10-CM | POA: Diagnosis not present

## 2017-08-04 DIAGNOSIS — J449 Chronic obstructive pulmonary disease, unspecified: Secondary | ICD-10-CM | POA: Diagnosis not present

## 2017-08-04 DIAGNOSIS — I481 Persistent atrial fibrillation: Secondary | ICD-10-CM | POA: Diagnosis not present

## 2017-08-04 DIAGNOSIS — Z9889 Other specified postprocedural states: Secondary | ICD-10-CM | POA: Diagnosis not present

## 2017-08-04 DIAGNOSIS — I10 Essential (primary) hypertension: Secondary | ICD-10-CM | POA: Diagnosis not present

## 2017-08-23 ENCOUNTER — Ambulatory Visit: Payer: Medicare PPO | Admitting: Internal Medicine

## 2017-08-30 ENCOUNTER — Other Ambulatory Visit: Payer: Self-pay | Admitting: Internal Medicine

## 2017-09-01 DIAGNOSIS — J449 Chronic obstructive pulmonary disease, unspecified: Secondary | ICD-10-CM | POA: Diagnosis not present

## 2017-09-02 ENCOUNTER — Other Ambulatory Visit: Payer: Self-pay | Admitting: Internal Medicine

## 2017-09-16 ENCOUNTER — Ambulatory Visit: Payer: Medicare PPO | Admitting: Internal Medicine

## 2017-09-28 ENCOUNTER — Other Ambulatory Visit: Payer: Self-pay | Admitting: Internal Medicine

## 2017-10-02 DIAGNOSIS — J449 Chronic obstructive pulmonary disease, unspecified: Secondary | ICD-10-CM | POA: Diagnosis not present

## 2017-11-01 DIAGNOSIS — J449 Chronic obstructive pulmonary disease, unspecified: Secondary | ICD-10-CM | POA: Diagnosis not present

## 2017-11-03 DIAGNOSIS — Z8673 Personal history of transient ischemic attack (TIA), and cerebral infarction without residual deficits: Secondary | ICD-10-CM | POA: Diagnosis not present

## 2017-11-03 DIAGNOSIS — J302 Other seasonal allergic rhinitis: Secondary | ICD-10-CM | POA: Diagnosis not present

## 2017-11-03 DIAGNOSIS — Z9889 Other specified postprocedural states: Secondary | ICD-10-CM | POA: Diagnosis not present

## 2017-11-03 DIAGNOSIS — J3089 Other allergic rhinitis: Secondary | ICD-10-CM | POA: Diagnosis not present

## 2017-11-03 DIAGNOSIS — J449 Chronic obstructive pulmonary disease, unspecified: Secondary | ICD-10-CM | POA: Diagnosis not present

## 2017-11-03 DIAGNOSIS — I10 Essential (primary) hypertension: Secondary | ICD-10-CM | POA: Diagnosis not present

## 2017-11-03 DIAGNOSIS — R0602 Shortness of breath: Secondary | ICD-10-CM | POA: Diagnosis not present

## 2017-11-03 DIAGNOSIS — R609 Edema, unspecified: Secondary | ICD-10-CM | POA: Diagnosis not present

## 2017-11-03 DIAGNOSIS — I639 Cerebral infarction, unspecified: Secondary | ICD-10-CM | POA: Diagnosis not present

## 2017-11-08 ENCOUNTER — Other Ambulatory Visit: Payer: Self-pay | Admitting: Internal Medicine

## 2017-12-02 DIAGNOSIS — J449 Chronic obstructive pulmonary disease, unspecified: Secondary | ICD-10-CM | POA: Diagnosis not present

## 2017-12-07 ENCOUNTER — Other Ambulatory Visit: Payer: Self-pay

## 2017-12-07 MED ORDER — POTASSIUM CHLORIDE ER 10 MEQ PO TBCR: 10.0000 meq | EXTENDED_RELEASE_TABLET | Freq: Every day | ORAL | 2 refills | Status: DC

## 2017-12-07 MED ORDER — SERTRALINE HCL 50 MG PO TABS: ORAL_TABLET | ORAL | 5 refills | Status: DC

## 2017-12-07 MED ORDER — ROSUVASTATIN CALCIUM 20 MG PO TABS: ORAL_TABLET | ORAL | 5 refills | Status: DC

## 2018-01-01 DIAGNOSIS — J449 Chronic obstructive pulmonary disease, unspecified: Secondary | ICD-10-CM | POA: Diagnosis not present

## 2018-01-11 ENCOUNTER — Other Ambulatory Visit: Payer: Self-pay | Admitting: Internal Medicine

## 2018-01-11 NOTE — Telephone Encounter (Signed)
Attempted to call patient regarding the refill for losartan 100 mg tab and make an appointment. Per recording on the phone, the patient was not accepting calls at this time.  Will route prescription to office.  LOV  07/01/17 LR 06/01/17 for 30 tabs and 5 refills. Dr. Nicki Reaper

## 2018-01-11 NOTE — Telephone Encounter (Signed)
Copied from [place] [phone]. Topic: Quick Communication - Rx Refill/Question >> Jan 11, 2018 11:16 AM Marin Olp L wrote: Medication: LOSARTAN 100MG  #31 DAILY (OUT OF MEDICATION)  Has the patient contacted their pharmacy? Yes.   (Agent: If no, request that the patient contact the pharmacy for the refill.) (Agent: If yes, when and what did the pharmacy advise?) FAXED YESTERDAY  Preferred Pharmacy (with phone number or street name):[facility] [address] [place], [place] - [address] [place] [address] Phone: [phone] Fax: [phone]  Agent: Please be advised that RX refills may take up to 3 business days. We ask that you follow-up with your pharmacy.

## 2018-01-12 ENCOUNTER — Telehealth: Payer: Self-pay | Admitting: Internal Medicine

## 2018-01-12 MED ORDER — LOSARTAN POTASSIUM 100 MG PO TABS: ORAL_TABLET | ORAL | 5 refills | Status: DC

## 2018-01-12 MED ORDER — ALBUTEROL SULFATE HFA 108 (90 BASE) MCG/ACT IN AERS: INHALATION_SPRAY | RESPIRATORY_TRACT | 6 refills | Status: DC

## 2018-01-12 NOTE — Telephone Encounter (Signed)
Called and spoke with pharmacist, patient was needing a refill of her ventolin HFA. There are no additional refills. She is needing a refill of this. Refill sent. Nothing further needed.

## 2018-01-13 ENCOUNTER — Encounter: Payer: Self-pay | Admitting: Internal Medicine

## 2018-01-13 ENCOUNTER — Ambulatory Visit: Payer: Medicare PPO | Admitting: Internal Medicine

## 2018-01-13 VITALS — BP 136/76 | HR 85 | Ht 66.0 in | Wt 235.6 lb

## 2018-01-13 DIAGNOSIS — J209 Acute bronchitis, unspecified: Secondary | ICD-10-CM | POA: Diagnosis not present

## 2018-01-13 DIAGNOSIS — J849 Interstitial pulmonary disease, unspecified: Secondary | ICD-10-CM

## 2018-01-13 MED ORDER — PROMETHAZINE-CODEINE 6.25-10 MG/5ML PO SYRP: 5.0000 mL | ORAL_SOLUTION | Freq: Four times a day (QID) | ORAL | 0 refills | Status: DC | PRN

## 2018-01-13 MED ORDER — PREDNISONE 10 MG PO TABS: ORAL_TABLET | ORAL | 0 refills | Status: DC

## 2018-01-13 NOTE — Progress Notes (Signed)
Patient ID: Brooke Baker, female    DOB: 17-Jun-1934, 84 y.o.   MRN: [id number]  HPI female never smoker followed for Asthma, allergic rhinitis, complicated by history DM, CAD, CVA/A. Fib/ anticoagulation, Seizure disorder Office Spirometry 05/07/16-moderately severe restriction of exhaled volume, moderate obstructive airways disease. FVC 1.59/55%, FEV1 1.14/52%, ratio 0.71, FEF 25-75% 0.75/47%  ------------------------------------------------------------------------------------ 02/22/17- 84 year old female never smoker followed for Asthma, allergic rhinitis, complicated by history DM, CAD, CVA/ A.Fib/ anticoagulation, Seizure disorder Ventolin HFA, Spiriva Respimat, Symbicort, nebulizer albuterol ----Cough; follow up per PM for acute visit. Pt states she is feeling much better since stopping her allergy injections. No cough now and feels well. CXR 11/11/16 IMPRESSION: Chronic interstitial changes without acute abnormality.  01/13/2018- 84 year old female never smoker followed for Asthma, allergic rhinitis, complicated by history DM, CAD, CVA/ A.Fib/ anticoagulation, Seizure disorder Ventolin HFA, Spiriva Respimat, Symbicort, nebulizer albuterol, tramadol O2 2 L/ [facility] started about 2 months ago Seen here by Dr. [place] Bells 04/20/2017 with CXR identification of ILD.  CXR repeated. -----Asthma;Pt states she is unable to get rid of cough and SOb since Christmas. Pt continues to wear O2. Did not come to office with O2 on today.  She and her husband were acutely ill with chills, increased cough at Christmas.  He got well.  She still has deep cough productive of white sputum.  No fever. We discussed previous imaging indicating interstitial changes needing follow-up. Treated for psoriasis by dermatologist.  Has osteoarthritis but not aware of any connective tissue disease. CXR 04/20/2017- Advanced interstitial lung disease. New patchy airspace disease right upper lobe, possible pneumonia.  Review of  Systems-see HPI   + = positive Constitutional:   No-   weight loss, night sweats, fevers, chills, + fatigue, lassitude. HEENT:   No-  headaches, difficulty swallowing, tooth/dental problems, sore throat,       No-  sneezing, itching, ear ache, nasal congestion, CV:  No-   chest pain,  +orthopnea, PND, swelling in lower extremities, anasarca,  dizziness, palpitations Resp: +shortness of breath with exertion or at rest.               +productive cough,   non-productive cough,  No- coughing up of blood.             change in color of mucus.   wheezing.   Skin: No-   rash or lesions. GI:  No-   heartburn, indigestion, abdominal pain, nausea, vomiting,  GU: MS:  No-   joint pain or swelling.  . Neuro-     nothing unusual Psych:  No- change in mood or affect. No depression or anxiety.  No memory loss.   Objective:   Physical Exam General- Alert, Oriented, Affect-appropriate, Distress- none acute,   +obese Skin- rash-none, lesions- none, excoriation- none Lymphadenopathy- none Head- atraumatic            Eyes- Gross vision intact, PERRLA, conjunctivae clear secretions.             Ears- Hearing, canals-normal            Nose-  no-Septal dev, mucus, polyps, erosion, perforation             Throat- Mallampati II , mucosa clear , drainage- none, tonsils- atrophic Neck- flexible , trachea midline, no stridor , thyroid nl, carotid no bruit Chest - symmetrical excursion , unlabored           Heart/CV-+ almost regular , no murmur , no gallop  , no rub,  nl s1 s2                           - JVD- none , edema- 1+ , stasis changes- none, varices- none           Lung-     +few crackles/unlabored, wheeze+            Chest wall-  Abd-  Br/ Gen/ Rectal- Not done, not indicated Extrem- cyanosis- none, clubbing, none, atrophy- none, strength- nl, + heavy legs with edema,           Neuro- grossly intact to observation

## 2018-01-13 NOTE — Patient Instructions (Signed)
Script sent for prednisone taper  Script sent for codeine cough syrup  Sample Trelegy inhaler    Inhale 1 puff, once daily, then rinse mouth Use this while sample lasts, instead of Spiriva and Symbicort. After the sample, go back to Spiriva and Symbicort  Order- schedule CT chest, Hi resolution, no contrast   "ILD protocol"  Dx ILD  Order- schedule PFT

## 2018-01-14 DIAGNOSIS — J849 Interstitial pulmonary disease, unspecified: Secondary | ICD-10-CM | POA: Insufficient documentation

## 2018-01-14 NOTE — Assessment & Plan Note (Signed)
Persistent pattern on CXR may include components of interstitial edema and scarring.  We need to define if she has a more significant process. Plan-schedule PFT, schedule high-resolution chest CT

## 2018-01-14 NOTE — Assessment & Plan Note (Addendum)
Exacerbation after viral type bronchitis this winter. Plan-prednisone taper, sample Trelegy, codeine cough syrup with discussion. We discussed steroid impact on her blood sugar.

## 2018-01-27 ENCOUNTER — Inpatient Hospital Stay: Admission: RE | Admit: 2018-01-27 | Payer: Medicare PPO | Source: Ambulatory Visit

## 2018-02-01 DIAGNOSIS — J449 Chronic obstructive pulmonary disease, unspecified: Secondary | ICD-10-CM | POA: Diagnosis not present

## 2018-02-09 ENCOUNTER — Other Ambulatory Visit: Payer: Self-pay | Admitting: *Deleted

## 2018-02-09 ENCOUNTER — Other Ambulatory Visit: Payer: Self-pay | Admitting: Internal Medicine

## 2018-02-09 ENCOUNTER — Other Ambulatory Visit: Payer: Self-pay | Admitting: Neurology

## 2018-02-09 MED ORDER — PHENYTOIN SODIUM EXTENDED 100 MG PO CAPS: ORAL_CAPSULE | ORAL | 1 refills | Status: DC

## 2018-02-09 NOTE — Telephone Encounter (Signed)
Pt has appt 04-05-2018 with Dr. Jannifer Franklin.  Will go ahead and refill to them for 30 day w/ one refill. to [facility].  New pharmacy for pt.  (transferred prescription only had one refill #120 caps 02-08-2017).

## 2018-02-28 ENCOUNTER — Ambulatory Visit: Payer: Medicare PPO

## 2018-03-03 DIAGNOSIS — Z9889 Other specified postprocedural states: Secondary | ICD-10-CM | POA: Diagnosis not present

## 2018-03-03 DIAGNOSIS — E785 Hyperlipidemia, unspecified: Secondary | ICD-10-CM | POA: Diagnosis not present

## 2018-03-03 DIAGNOSIS — J4540 Moderate persistent asthma, uncomplicated: Secondary | ICD-10-CM | POA: Diagnosis not present

## 2018-03-03 DIAGNOSIS — I10 Essential (primary) hypertension: Secondary | ICD-10-CM | POA: Diagnosis not present

## 2018-03-03 DIAGNOSIS — Z7901 Long term (current) use of anticoagulants: Secondary | ICD-10-CM | POA: Diagnosis not present

## 2018-03-03 DIAGNOSIS — I2510 Atherosclerotic heart disease of native coronary artery without angina pectoris: Secondary | ICD-10-CM | POA: Diagnosis not present

## 2018-03-03 DIAGNOSIS — R0602 Shortness of breath: Secondary | ICD-10-CM | POA: Diagnosis not present

## 2018-03-03 DIAGNOSIS — I639 Cerebral infarction, unspecified: Secondary | ICD-10-CM | POA: Diagnosis not present

## 2018-03-03 DIAGNOSIS — J449 Chronic obstructive pulmonary disease, unspecified: Secondary | ICD-10-CM | POA: Diagnosis not present

## 2018-03-04 DIAGNOSIS — J449 Chronic obstructive pulmonary disease, unspecified: Secondary | ICD-10-CM | POA: Diagnosis not present

## 2018-03-19 ENCOUNTER — Other Ambulatory Visit: Payer: Self-pay | Admitting: Internal Medicine

## 2018-03-28 ENCOUNTER — Ambulatory Visit: Payer: Medicare PPO | Admitting: Nurse Practitioner

## 2018-03-28 ENCOUNTER — Other Ambulatory Visit: Payer: Self-pay | Admitting: Nurse Practitioner

## 2018-04-02 DIAGNOSIS — J449 Chronic obstructive pulmonary disease, unspecified: Secondary | ICD-10-CM | POA: Diagnosis not present

## 2018-04-05 ENCOUNTER — Ambulatory Visit: Payer: Medicare PPO | Admitting: Neurology

## 2018-04-06 ENCOUNTER — Ambulatory Visit: Payer: Medicare PPO

## 2018-04-12 DIAGNOSIS — I2510 Atherosclerotic heart disease of native coronary artery without angina pectoris: Secondary | ICD-10-CM | POA: Diagnosis not present

## 2018-04-12 DIAGNOSIS — Z96641 Presence of right artificial hip joint: Secondary | ICD-10-CM | POA: Diagnosis not present

## 2018-04-12 DIAGNOSIS — I10 Essential (primary) hypertension: Secondary | ICD-10-CM | POA: Diagnosis not present

## 2018-04-12 DIAGNOSIS — G4733 Obstructive sleep apnea (adult) (pediatric): Secondary | ICD-10-CM | POA: Diagnosis not present

## 2018-04-12 DIAGNOSIS — H547 Unspecified visual loss: Secondary | ICD-10-CM | POA: Diagnosis not present

## 2018-04-12 DIAGNOSIS — F325 Major depressive disorder, single episode, in full remission: Secondary | ICD-10-CM | POA: Diagnosis not present

## 2018-04-12 DIAGNOSIS — M158 Other polyosteoarthritis: Secondary | ICD-10-CM | POA: Diagnosis not present

## 2018-04-12 DIAGNOSIS — E785 Hyperlipidemia, unspecified: Secondary | ICD-10-CM | POA: Diagnosis not present

## 2018-04-12 DIAGNOSIS — J449 Chronic obstructive pulmonary disease, unspecified: Secondary | ICD-10-CM | POA: Diagnosis not present

## 2018-04-12 DIAGNOSIS — G40909 Epilepsy, unspecified, not intractable, without status epilepticus: Secondary | ICD-10-CM | POA: Diagnosis not present

## 2018-04-12 DIAGNOSIS — Z96651 Presence of right artificial knee joint: Secondary | ICD-10-CM | POA: Diagnosis not present

## 2018-04-15 ENCOUNTER — Other Ambulatory Visit: Payer: Self-pay | Admitting: Internal Medicine

## 2018-04-15 ENCOUNTER — Other Ambulatory Visit: Payer: Self-pay | Admitting: Neurology

## 2018-04-18 ENCOUNTER — Ambulatory Visit: Payer: Medicare PPO | Admitting: Internal Medicine

## 2018-05-03 DIAGNOSIS — J449 Chronic obstructive pulmonary disease, unspecified: Secondary | ICD-10-CM | POA: Diagnosis not present

## 2018-05-16 ENCOUNTER — Ambulatory Visit (INDEPENDENT_AMBULATORY_CARE_PROVIDER_SITE_OTHER): Payer: Medicare PPO

## 2018-05-16 ENCOUNTER — Other Ambulatory Visit: Payer: Self-pay

## 2018-05-16 DIAGNOSIS — Z0000 Encounter for general adult medical examination without abnormal findings: Secondary | ICD-10-CM | POA: Diagnosis not present

## 2018-05-16 NOTE — Patient Instructions (Addendum)
  Brooke Baker , Thank you for taking time to come for your Medicare Wellness Visit. I appreciate your ongoing commitment to your health goals. Please review the following plan we discussed and let me know if I can assist you in the future.   These are the goals we discussed: Goals    . Healthy choices when dining out       This is a list of the screening recommended for you and due dates:  Health Maintenance  Topic Date Due  . Eye exam for diabetics  06/26/1946  . Tetanus Vaccine  06/26/1955  . Complete foot exam   10/30/2017  . Hemoglobin A1C  11/05/2017  . Mammogram  05/12/2018  . Flu Shot  08/06/2018  . DEXA scan (bone density measurement)  Completed  . Pneumonia vaccines  Completed

## 2018-05-16 NOTE — Progress Notes (Signed)
Subjective:   MARLENY FALLER is a 84 y.o. female who presents for Medicare Annual (Subsequent) preventive examination.  Review of Systems:  No ROS.  Medicare Wellness Virtual Visit.  Visual/audio telehealth visit, UTA vital signs.   See social history for additional risk factors.  Cardiac Risk Factors include: advanced age (>59men, >64 women);hypertension;diabetes mellitus     Objective:     Vitals: LMP 08/17/1965   There is no height or weight on file to calculate BMI.  Advanced Directives 05/16/2018 02/25/2017 02/25/2016  Does Patient Have a Medical Advance Directive? No No No  Would patient like information on creating a medical advance directive? No - Patient declined No - Patient declined No - Patient declined    Tobacco Social History   Tobacco Use  Smoking Status Never Smoker  Smokeless Tobacco Never Used     Counseling given: Not Answered   Clinical Intake:  Pre-visit preparation completed: Yes        Diabetes: Yes  How often do you need to have someone help you when you read instructions, pamphlets, or other written materials from your doctor or pharmacy?: 1 - Never  Interpreter Needed?: No     Past Medical History:  Diagnosis Date  . Acute bronchitis   . Allergic rhinitis   . Arthritis   . CAD (coronary artery disease)    s/p stent LAD 8/03 and stent placement OM1  . Cancer ([place])    skin  . Chronic back pain   . COPD with asthma ([place])   . CVA (cerebral vascular accident) ([place])   . Diabetes mellitus ([place])   . Hypercholesterolemia   . Hypertension   . Seizure disorder [facility])    Past Surgical History:  Procedure Laterality Date  . BREAST BIOPSY Bilateral    axillas  . CORONARY STENT PLACEMENT  8/03   LAD  . CORONARY STENT PLACEMENT     OM1  2006  . FEMUR FRACTURE SURGERY Right [id number]   Dr. Marry Guan  . LUMBAR SPINE SURGERY     x3  . TOTAL ABDOMINAL HYSTERECTOMY     appendectomy - age 28  . TOTAL HIP ARTHROPLASTY    . TOTAL KNEE  ARTHROPLASTY     2005   Family History  Problem Relation Age of Onset  . Allergies Brother   . Cancer Brother   . Allergies Sister   . Cancer Sister        Non-hodgkins lymphoma  . Asthma Sister   . Rheum arthritis Sister   . Stroke Mother   . Asthma Brother   . Rheum arthritis Brother   . Cancer Sister        non-hodgkins lymphoma  . Diabetes Brother    Social History   Socioeconomic History  . Marital status: Married    Spouse name: Not on file  . Number of children: 3  . Years of education: hs  . Highest education level: Not on file  Occupational History  . Occupation: retired Psychologist, sport and exercise: RETIRED  Social Needs  . Financial resource strain: Not hard at all  . Food insecurity:    Worry: Never true    Inability: Never true  . Transportation needs:    Medical: No    Non-medical: No  Tobacco Use  . Smoking status: Never Smoker  . Smokeless tobacco: Never Used  Substance and Sexual Activity  . Alcohol use: No    Alcohol/week: 0.0 standard drinks  . Drug use:  No  . Sexual activity: Never  Lifestyle  . Physical activity:    Days per week: 0 days    Minutes per session: Not on file  . Stress: Not at all  Relationships  . Social connections:    Talks on phone: Not on file    Gets together: Not on file    Attends religious service: Not on file    Active member of club or organization: Not on file    Attends meetings of clubs or organizations: Not on file    Relationship status: Not on file  Other Topics Concern  . Not on file  Social History Narrative   lives with husband- Edsel    Outpatient Encounter Medications as of 05/16/2018  Medication Sig  . albuterol (PROVENTIL) (2.5 MG/3ML) 0.083% nebulizer solution USE ONE VIAL VIA NEBULZIER EVERY FOUR HOURS AS NEEDED FOR WHEEZING  . albuterol (VENTOLIN HFA) 108 (90 Base) MCG/ACT inhaler USE 2 PUFFS EVERY 4 HOURS AS DIRECTED - RESCUE  . amLODipine (NORVASC) 10 MG tablet TAKE ONE TABLET EACH DAY  .  aspirin 81 MG tablet Take 81 mg by mouth daily.    . dabigatran (PRADAXA) 150 MG CAPS Take 150 mg by mouth every 12 (twelve) hours.  . fluticasone (FLONASE) 50 MCG/ACT nasal spray USE TWO SPRAYS IN EACH NOSTRIL EACH DAY AS DIRECTED BY PHYSICIAN  . furosemide (LASIX) 40 MG tablet Take 1 tablet by mouth daily.  [name] levETIRAcetam (KEPPRA) 750 MG tablet TAKE 1 TABLET (750MG  TOTAL) BY MOUTH TWOTIMES DAILY  . losartan (COZAAR) 100 MG tablet TAKE ONE (1) TABLET EACH DAY  . metoprolol succinate (TOPROL-XL) 50 MG 24 hr tablet TAKE 1 TABLET BY MOUTH TWICE A DAY. TAKEWITH OR IMMEDIATELY FOLLOWINGA MEAL  . Omega-3 Fatty Acids (FISH OIL) 1000 MG CAPS Take 1 capsule by mouth 2 (two) times daily.    . phenytoin (DILANTIN) 100 MG ER capsule TAKE 4 CAPSULE BY MOUTH EVERY NIGHT AT BEDTIME  . potassium chloride (K-DUR) 10 MEQ tablet TAKE 1 TABLET BY MOUTH ONCE DAILY  . rosuvastatin (CRESTOR) 20 MG tablet TAKE ONE (1) TABLET EACH DAY  . sertraline (ZOLOFT) 50 MG tablet TAKE 1 AND ONE-HALF TABLETS BY MOUTH ONCE DAILY  . SYMBICORT 80-4.5 MCG/ACT inhaler INHALE 2 PUFFS TWICE A DAY  . Tiotropium Bromide Monohydrate (SPIRIVA RESPIMAT) 2.5 MCG/ACT AERS Inhale 2 puffs into the lungs daily.  . traMADol (ULTRAM) 50 MG tablet 1 or 2 every 8 hours if needed for cough  . [DISCONTINUED] predniSONE (DELTASONE) 10 MG tablet 4 X 2 DAYS, 3 X 2 DAYS, 2 X 2 DAYS, 1 X 2 DAYS  . [DISCONTINUED] promethazine-codeine (PHENERGAN WITH CODEINE) 6.25-10 MG/5ML syrup Take 5 mLs by mouth every 6 (six) hours as needed for cough.   No facility-administered encounter medications on file as of 05/16/2018.     Activities of Daily Living In your present state of health, do you have any difficulty performing the following activities: 05/16/2018  Hearing? N  Vision? N  Difficulty concentrating or making decisions? N  Walking or climbing stairs? Y  Comment Notes unsteady gait. 3 wheel walker in use when ambulating.   Dressing or bathing? N  Doing  errands, shopping? N  Preparing Food and eating ? N  Comment States she does not cook. She and her husband order take out or dine out for meals only. Self feeds.   Using the Toilet? N  In the past six months, have you accidently leaked urine? N  Do you have problems with loss of bowel control? N  Managing your Medications? N  Managing your Finances? Y  Comment Husband manages finances  Housekeeping or managing your Housekeeping? Y  Some recent data might be hidden    Patient Care Team: Einar Pheasant, MD as PCP - General    Assessment:   This is a routine wellness examination for Lexia.  I connected with patient 05/16/18 at  1:30 PM EDT by an audio enabled telemedicine application and verified that I am speaking with the correct person using two identifiers. Patient stated full name and DOB. Patient gave permission to continue with virtual visit. Patient's location was at home and Nurse's location was at [place] office.   Keep all routine maintenance appointments.  Encouraged to schedule an appointment with her primary care provider for routine follow up.   Health Screenings  Mammogram - 05/2017 Bone Density - 04/2016 Glaucoma -none Hearing -demonstrates normal hearing during visit. Hemoglobin A1C - 05/2017 Cholesterol - 05/2017 Dental- UTD Vision- visits within the last 12 months.  Social  Alcohol intake - no       Smoking history- never   Smokers in home? none Illicit drug use? none Exercise - chair/standing exercise encouraged daily Diet - regular Sexually Active -never BMI- discussed the importance of a healthy diet, water intake and the benefits of aerobic exercise.  Educational material provided.   Safety  Patient feels safe at home- yes Patient does have smoke detectors at home- yes Patient does wear sunscreen or protective clothing when in direct sunlight -yes Patient does wear seat belt when in a moving vehicle -yes  Covid-19 precautions and sickness symptoms  discussed.   Activities of Daily Living Patient states she has assistance with: household chores,  managing money, or preparing meals. Self completes all other ADLs.  Depression Screen Patient denies losing interest in daily life, feeling hopeless, or crying easily over simple problems. Reports change in dose of antidepressant is working well.   Medication-taking as directed and without issues.   Fall Screen Patient denies being afraid of falling or falling in the last year.   Memory Screen Patient is alert.  Patient denies difficulty focusing, concentrating or misplacing items. Correctly identified the president of the [place] , season and recall. Patient likes to read a little for brain stimulation.  Immunizations The following Immunizations were discussed: Influenza, shingles, pneumonia, and tetanus.   Other Providers Patient Care Team: Einar Pheasant, MD as PCP - General  Exercise Activities and Dietary recommendations Current Exercise Habits: The patient does not participate in regular exercise at present  Goals    . Healthy choices when dining out       Fall Risk Fall Risk  05/16/2018 03/25/2017 02/25/2017 02/25/2016 09/13/2015  Falls in the past year? 0 No No Yes No  Number falls in past yr: - - - 2 or more -  Injury with Fall? - - - Yes -  Risk Factor Category  - - - High Fall Risk -  Risk for fall due to : - - - History of fall(s) -  Follow up - - - Falls prevention discussed;Education provided -   Depression Screen PHQ 2/9 Scores 05/16/2018 02/25/2017 02/25/2016 09/13/2015  PHQ - 2 Score 0 0 0 0     Cognitive Function MMSE - Mini Mental State Exam 02/25/2016  Orientation to time 5  Orientation to Place 5  Registration 3  Attention/ Calculation 5  Recall 3  Language- name 2 objects 2  Language- repeat 1  Language- follow 3 step command 3  Language- read & follow direction 1  Write a sentence 1  Copy design 1  Total score 30     6CIT Screen 05/16/2018 02/25/2017   What Year? 0 points 0 points  What month? 0 points 0 points  What time? 0 points 0 points  Count back from 20 0 points 0 points  Months in reverse 0 points 0 points  Repeat phrase 0 points 0 points  Total Score 0 0    Immunization History  Administered Date(s) Administered  . Influenza Split 11/06/2010, 11/02/2011, 10/17/2012, 10/12/2013  . Influenza Whole 10/28/2004, 10/05/2008  . Influenza, High Dose Seasonal PF 09/13/2015, 11/17/2016, 10/05/2017  . Influenza-Unspecified 10/04/2014  . Pneumococcal Conjugate-13 11/19/2012  . Pneumococcal Polysaccharide-23 01/05/2005, 10/04/2014  . Zoster 12/22/2012   Screening Tests Health Maintenance  Topic Date Due  . OPHTHALMOLOGY EXAM  06/26/1946  . TETANUS/TDAP  06/26/1955  . FOOT EXAM  10/30/2017  . HEMOGLOBIN A1C  11/05/2017  . MAMMOGRAM  05/12/2018  . INFLUENZA VACCINE  08/06/2018  . DEXA SCAN  Completed  . PNA vac Low Risk Adult  Completed      Plan:   End of life planning; Advanced aging; Advanced directives discussed.  No HCPOA/Living Will.  Additional information declined at this time.  I have personally reviewed and noted the following in the patient's chart:   . Medical and social history . Use of alcohol, tobacco or illicit drugs  . Current medications and supplements . Functional ability and status . Nutritional status . Physical activity . Advanced directives . List of other physicians . Hospitalizations, surgeries, and ER visits in previous 12 months . Vitals . Screenings to include cognitive, depression, and falls . Referrals and appointments  In addition, I have reviewed and discussed with patient certain preventive protocols, quality metrics, and best practice recommendations. A written personalized care plan for preventive services as well as general preventive health recommendations were provided to patient.     Varney Biles, LPN  8/36/6294   Reviewed above information.  Agree with  assessment and plan.    Dr Nicki Reaper

## 2018-05-20 ENCOUNTER — Other Ambulatory Visit: Payer: Self-pay

## 2018-05-20 ENCOUNTER — Encounter: Payer: Self-pay | Admitting: Internal Medicine

## 2018-05-20 ENCOUNTER — Ambulatory Visit (INDEPENDENT_AMBULATORY_CARE_PROVIDER_SITE_OTHER): Payer: Medicare PPO | Admitting: Internal Medicine

## 2018-05-20 DIAGNOSIS — I509 Heart failure, unspecified: Secondary | ICD-10-CM | POA: Diagnosis not present

## 2018-05-20 DIAGNOSIS — G8929 Other chronic pain: Secondary | ICD-10-CM

## 2018-05-20 DIAGNOSIS — Z1239 Encounter for other screening for malignant neoplasm of breast: Secondary | ICD-10-CM

## 2018-05-20 DIAGNOSIS — J849 Interstitial pulmonary disease, unspecified: Secondary | ICD-10-CM

## 2018-05-20 DIAGNOSIS — E7800 Pure hypercholesterolemia, unspecified: Secondary | ICD-10-CM | POA: Diagnosis not present

## 2018-05-20 DIAGNOSIS — I10 Essential (primary) hypertension: Secondary | ICD-10-CM | POA: Diagnosis not present

## 2018-05-20 DIAGNOSIS — L4050 Arthropathic psoriasis, unspecified: Secondary | ICD-10-CM

## 2018-05-20 DIAGNOSIS — M545 Low back pain: Secondary | ICD-10-CM | POA: Diagnosis not present

## 2018-05-20 DIAGNOSIS — E1140 Type 2 diabetes mellitus with diabetic neuropathy, unspecified: Secondary | ICD-10-CM | POA: Diagnosis not present

## 2018-05-20 DIAGNOSIS — I4891 Unspecified atrial fibrillation: Secondary | ICD-10-CM

## 2018-05-20 DIAGNOSIS — F439 Reaction to severe stress, unspecified: Secondary | ICD-10-CM

## 2018-05-20 DIAGNOSIS — J4540 Moderate persistent asthma, uncomplicated: Secondary | ICD-10-CM

## 2018-05-20 DIAGNOSIS — G40909 Epilepsy, unspecified, not intractable, without status epilepticus: Secondary | ICD-10-CM

## 2018-05-20 NOTE — Progress Notes (Signed)
Patient ID: Brooke Baker, female   DOB: 08-07-1934, 84 y.o.   MRN: [id number]   Virtual Visit via telephone Note  This visit type was conducted due to national recommendations for restrictions regarding the COVID-19 pandemic (e.g. social distancing).  This format is felt to be most appropriate for this patient at this time.  All issues noted in this document were discussed and addressed.  No physical exam was performed (except for noted visual exam findings with Video Visits).   I connected with Harless Nakayama by telephone and verified that I am speaking with the correct person using two identifiers. Location patient: home Location provider: work Persons participating in the telephone visit: patient, provider  I discussed the limitations, risks, security and privacy concerns of performing an evaluation and management service by telephone and the availability of in person appointments.  The patient expressed understanding and agreed to proceed.   Reason for visit: scheduled follow up.   HPI: She reports she is doing relatively well.  Sees cardiology for f/u CAD and afib.  On pradaxa.  Stable.  Also seeing Dr Annamaria Boots for her underlying lung issues.  Breathing better.  No increased cough or congestion.  She is to have f/u PFTs and CT chest.  Plans to f/u with pulmonary regarding these tests.  No chest pain.  No acid reflux.  No abdominal pain. Taking miralax.  Bowels moving with this regimen.  States blood pressures are averaging 130s/60s.  Needs mammogram.     ROS: See pertinent positives and negatives per HPI.  Past Medical History:  Diagnosis Date  . Acute bronchitis   . Allergic rhinitis   . Arthritis   . CAD (coronary artery disease)    s/p stent LAD 8/03 and stent placement OM1  . Cancer ([place])    skin  . Chronic back pain   . COPD with asthma ([place])   . CVA (cerebral vascular accident) ([place])   . Diabetes mellitus ([place])   . Hypercholesterolemia   . Hypertension   . Seizure  disorder [facility])     Past Surgical History:  Procedure Laterality Date  . BREAST BIOPSY Bilateral    axillas  . CORONARY STENT PLACEMENT  8/03   LAD  . CORONARY STENT PLACEMENT     OM1  2006  . FEMUR FRACTURE SURGERY Right [id number]   Dr. Marry Guan  . LUMBAR SPINE SURGERY     x3  . TOTAL ABDOMINAL HYSTERECTOMY     appendectomy - age 89  . TOTAL HIP ARTHROPLASTY    . TOTAL KNEE ARTHROPLASTY     2005    Family History  Problem Relation Age of Onset  . Allergies Brother   . Cancer Brother   . Allergies Sister   . Cancer Sister        Non-hodgkins lymphoma  . Asthma Sister   . Rheum arthritis Sister   . Stroke Mother   . Asthma Brother   . Rheum arthritis Brother   . Cancer Sister        non-hodgkins lymphoma  . Diabetes Brother     SOCIAL HX: reviewed.    Current Outpatient Medications:  .  albuterol (PROVENTIL) (2.5 MG/3ML) 0.083% nebulizer solution, USE ONE VIAL VIA NEBULZIER EVERY FOUR HOURS AS NEEDED FOR WHEEZING, Disp: 180 vial, Rfl: 0 .  albuterol (VENTOLIN HFA) 108 (90 Base) MCG/ACT inhaler, USE 2 PUFFS EVERY 4 HOURS AS DIRECTED - RESCUE, Disp: 1 Inhaler, Rfl: 6 .  amLODipine (NORVASC) 10 MG tablet,  TAKE ONE TABLET EACH DAY, Disp: 30 tablet, Rfl: 2 .  aspirin 81 MG tablet, Take 81 mg by mouth daily.  , Disp: , Rfl:  .  dabigatran (PRADAXA) 150 MG CAPS, Take 150 mg by mouth every 12 (twelve) hours., Disp: , Rfl:  .  fluticasone (FLONASE) 50 MCG/ACT nasal spray, USE TWO SPRAYS IN EACH NOSTRIL EACH DAY AS DIRECTED BY PHYSICIAN, Disp: 16 g, Rfl: 3 .  furosemide (LASIX) 40 MG tablet, Take 1 tablet by mouth daily., Disp: , Rfl:  .  levETIRAcetam (KEPPRA) 750 MG tablet, TAKE 1 TABLET (750MG TOTAL) BY MOUTH TWOTIMES DAILY, Disp: 60 tablet, Rfl: 1 .  losartan (COZAAR) 100 MG tablet, TAKE ONE (1) TABLET EACH DAY, Disp: 30 tablet, Rfl: 5 .  metoprolol succinate (TOPROL-XL) 50 MG 24 hr tablet, TAKE 1 TABLET BY MOUTH TWICE A DAY. TAKEWITH OR IMMEDIATELY FOLLOWINGA MEAL, Disp: 60  tablet, Rfl: 3 .  Omega-3 Fatty Acids (FISH OIL) 1000 MG CAPS, Take 1 capsule by mouth 2 (two) times daily.  , Disp: , Rfl:  .  phenytoin (DILANTIN) 100 MG ER capsule, TAKE 4 CAPSULE BY MOUTH EVERY NIGHT AT BEDTIME, Disp: 120 capsule, Rfl: 1 .  potassium chloride (K-DUR) 10 MEQ tablet, TAKE 1 TABLET BY MOUTH ONCE DAILY, Disp: 30 tablet, Rfl: 2 .  rosuvastatin (CRESTOR) 20 MG tablet, TAKE ONE (1) TABLET EACH DAY, Disp: 30 tablet, Rfl: 5 .  sertraline (ZOLOFT) 50 MG tablet, TAKE 1 AND ONE-HALF TABLETS BY MOUTH ONCE DAILY, Disp: 45 tablet, Rfl: 5 .  SYMBICORT 80-4.5 MCG/ACT inhaler, INHALE 2 PUFFS TWICE A DAY, Disp: 10.2 Inhaler, Rfl: 5 .  Tiotropium Bromide Monohydrate (SPIRIVA RESPIMAT) 2.5 MCG/ACT AERS, Inhale 2 puffs into the lungs daily., Disp: 1 Inhaler, Rfl: 0 .  traMADol (ULTRAM) 50 MG tablet, 1 or 2 every 8 hours if needed for cough, Disp: 30 tablet, Rfl: 0  EXAM:  VITALS per patient if applicable:  [id number].   GENERAL: alert. Sounds to be in no acute distress.  Answering questions appropriately.    PSYCH/NEURO: pleasant and cooperative, no obvious depression or anxiety, speech and thought processing grossly intact  ASSESSMENT AND PLAN:  Discussed the following assessment and plan:  Breast cancer screening - Plan: MM 3D SCREEN BREAST BILATERAL  Moderate persistent asthma without complication  Atrial fibrillation, unspecified type (HCC)  Congestive heart failure, unspecified HF chronicity, unspecified heart failure type (HCC)  Chronic low back pain, unspecified back pain laterality, unspecified whether sciatica present  Type 2 diabetes mellitus with diabetic neuropathy, without long-term current use of insulin (HCC) - Plan: Hemoglobin K5L, Basic metabolic panel, Microalbumin / creatinine urine ratio  Hypercholesterolemia - Plan: Hepatic function panel, Lipid panel  Essential hypertension - Plan: CBC with Differential/Platelet, TSH  ILD (interstitial lung disease) (HCC)   Psoriatic arthritis (HCC)  Seizure disorder (HCC)  Stress  Asthma, moderate persistent Followed by pulmonary.  Planning for f/u PFTs and chest CT.  Breathing better.    Atrial fibrillation Followed by cardiology.  Stable.  On pradaxa.    CHF (congestive heart failure) ([place]) Has done well on current medication regimen.  On lasix 78m q day now.  Breathing is better.    Chronic back pain Stable.   Diabetes mellitus Low carb diet and exercise.  Follow met b and a1c.    Hypercholesterolemia On crestor.  Low cholesterol diet and exercise.  Follow lipid panel and liver function tests.    Hypertension Blood pressure as outlined.  Continue same medication  regimen.  Follow pressures.  Follow metabolic panel.   ILD (interstitial lung disease) ([place]) Followed by pulmonary.  Planning for chest CT.    Psoriatic arthritis ([place]) Has been followed by rheumatology.    Seizure disorder Has been followed by neurology.   Stress Discussed with her today.  Overall she feels she is handling things better.  Follow.      I discussed the assessment and treatment plan with the patient. The patient was provided an opportunity to ask questions and all were answered. The patient agreed with the plan and demonstrated an understanding of the instructions.   The patient was advised to call back or seek an in-person evaluation if the symptoms worsen or if the condition fails to improve as anticipated.  I provided 15 minutes of non-face-to-face time during this encounter.   Einar Pheasant, MD

## 2018-05-22 ENCOUNTER — Encounter: Payer: Self-pay | Admitting: Internal Medicine

## 2018-05-22 NOTE — Assessment & Plan Note (Signed)
Blood pressure as outlined.  Continue same medication regimen.  Follow pressures.  Follow metabolic panel.  

## 2018-05-22 NOTE — Assessment & Plan Note (Signed)
Has been followed by rheumatology.

## 2018-05-22 NOTE — Assessment & Plan Note (Signed)
Discussed with her today.  Overall she feels she is handling things better.  Follow.

## 2018-05-22 NOTE — Assessment & Plan Note (Signed)
Has been followed by neurology.   

## 2018-05-22 NOTE — Assessment & Plan Note (Signed)
Stable

## 2018-05-22 NOTE — Assessment & Plan Note (Signed)
On crestor.  Low cholesterol diet and exercise.  Follow lipid panel and liver function tests.   

## 2018-05-22 NOTE — Assessment & Plan Note (Signed)
Followed by pulmonary.  Planning for chest CT.

## 2018-05-22 NOTE — Assessment & Plan Note (Signed)
Followed by cardiology.  Stable.  On pradaxa.

## 2018-05-22 NOTE — Assessment & Plan Note (Signed)
Low carb diet and exercise.  Follow met b and a1c.   

## 2018-05-22 NOTE — Assessment & Plan Note (Signed)
Followed by pulmonary.  Planning for f/u PFTs and chest CT.  Breathing better.

## 2018-05-22 NOTE — Assessment & Plan Note (Signed)
Has done well on current medication regimen.  On lasix 40mg  q day now.  Breathing is better.

## 2018-05-26 ENCOUNTER — Encounter: Payer: Self-pay | Admitting: Internal Medicine

## 2018-05-31 ENCOUNTER — Other Ambulatory Visit: Payer: Self-pay | Admitting: Neurology

## 2018-06-02 DIAGNOSIS — J449 Chronic obstructive pulmonary disease, unspecified: Secondary | ICD-10-CM | POA: Diagnosis not present

## 2018-06-08 ENCOUNTER — Other Ambulatory Visit: Payer: Self-pay | Admitting: Internal Medicine

## 2018-06-10 ENCOUNTER — Other Ambulatory Visit (INDEPENDENT_AMBULATORY_CARE_PROVIDER_SITE_OTHER): Payer: Medicare PPO

## 2018-06-10 ENCOUNTER — Other Ambulatory Visit: Payer: Self-pay

## 2018-06-10 DIAGNOSIS — I10 Essential (primary) hypertension: Secondary | ICD-10-CM | POA: Diagnosis not present

## 2018-06-10 DIAGNOSIS — E1140 Type 2 diabetes mellitus with diabetic neuropathy, unspecified: Secondary | ICD-10-CM | POA: Diagnosis not present

## 2018-06-10 DIAGNOSIS — E7800 Pure hypercholesterolemia, unspecified: Secondary | ICD-10-CM

## 2018-06-10 LAB — HEPATIC FUNCTION PANEL
ALT: 13 U/L (ref 0–35)
AST: 25 U/L (ref 0–37)
Albumin: 3 g/dL — ABNORMAL LOW (ref 3.5–5.2)
Alkaline Phosphatase: 130 U/L — ABNORMAL HIGH (ref 39–117)
Bilirubin, Direct: 0.2 mg/dL (ref 0.0–0.3)
Total Bilirubin: 0.7 mg/dL (ref 0.2–1.2)
Total Protein: 7.1 g/dL (ref 6.0–8.3)

## 2018-06-10 LAB — CBC WITH DIFFERENTIAL/PLATELET
Basophils Absolute: 0 10*3/uL (ref 0.0–0.1)
Basophils Relative: 0.6 % (ref 0.0–3.0)
Eosinophils Absolute: 0.6 10*3/uL (ref 0.0–0.7)
Eosinophils Relative: 12.2 % — ABNORMAL HIGH (ref 0.0–5.0)
HCT: 35.2 % — ABNORMAL LOW (ref 36.0–46.0)
Hemoglobin: 12 g/dL (ref 12.0–15.0)
Lymphocytes Relative: 16.7 % (ref 12.0–46.0)
Lymphs Abs: 0.8 10*3/uL (ref 0.7–4.0)
MCHC: 34 g/dL (ref 30.0–36.0)
MCV: 101.8 fl — ABNORMAL HIGH (ref 78.0–100.0)
Monocytes Absolute: 0.6 10*3/uL (ref 0.1–1.0)
Monocytes Relative: 13.3 % — ABNORMAL HIGH (ref 3.0–12.0)
Neutro Abs: 2.7 10*3/uL (ref 1.4–7.7)
Neutrophils Relative %: 57.2 % (ref 43.0–77.0)
Platelets: 134 10*3/uL — ABNORMAL LOW (ref 150.0–400.0)
RBC: 3.45 Mil/uL — ABNORMAL LOW (ref 3.87–5.11)
RDW: 13.1 % (ref 11.5–15.5)
WBC: 4.7 10*3/uL (ref 4.0–10.5)

## 2018-06-10 LAB — TSH: TSH: 3.94 u[IU]/mL (ref 0.35–4.50)

## 2018-06-10 LAB — BASIC METABOLIC PANEL
BUN: 28 mg/dL — ABNORMAL HIGH (ref 6–23)
CO2: 21 mEq/L (ref 19–32)
Calcium: 8.2 mg/dL — ABNORMAL LOW (ref 8.4–10.5)
Chloride: 107 mEq/L (ref 96–112)
Creatinine, Ser: 1.19 mg/dL (ref 0.40–1.20)
GFR: 43.43 mL/min — ABNORMAL LOW (ref >60.00)
Glucose, Bld: 111 mg/dL — ABNORMAL HIGH (ref 70–99)
Potassium: 4.2 mEq/L (ref 3.5–5.1)
Sodium: 138 mEq/L (ref 135–145)

## 2018-06-10 LAB — MICROALBUMIN / CREATININE URINE RATIO
Creatinine,U: 133.6 mg/dL
Microalb Creat Ratio: 4.1 mg/g (ref 0.0–30.0)
Microalb, Ur: 5.5 mg/dL — ABNORMAL HIGH (ref 0.0–1.9)

## 2018-06-10 LAB — HEMOGLOBIN A1C: Hgb A1c MFr Bld: 5.8 % (ref 4.6–6.5)

## 2018-06-10 LAB — LIPID PANEL
Cholesterol: 103 mg/dL (ref 0–200)
HDL: 37 mg/dL — ABNORMAL LOW (ref >39.00)
LDL Cholesterol: 53 mg/dL (ref 0–99)
NonHDL: 66.03
Total CHOL/HDL Ratio: 3
Triglycerides: 64 mg/dL (ref 0.0–149.0)
VLDL: 12.8 mg/dL (ref 0.0–40.0)

## 2018-06-10 NOTE — Progress Notes (Signed)
Prior to drawing the patients blood this morning in her right hand, I tapped her vein a few times. Her hand immediately changed colors & I asked if she was on blood thinners. She told me yes and not to worry because this happens all the time. I immediately applied pressure and wrapper her hand with coban and several gauze pads. I did not take any blood from her right hand. I successfully drew blood from her left hand after only applying heat. I have made a note to only use heat on her hands in the future.

## 2018-06-13 ENCOUNTER — Other Ambulatory Visit: Payer: Self-pay | Admitting: Internal Medicine

## 2018-06-13 DIAGNOSIS — D7589 Other specified diseases of blood and blood-forming organs: Secondary | ICD-10-CM

## 2018-06-13 DIAGNOSIS — R748 Abnormal levels of other serum enzymes: Secondary | ICD-10-CM

## 2018-06-13 DIAGNOSIS — D696 Thrombocytopenia, unspecified: Secondary | ICD-10-CM

## 2018-06-13 DIAGNOSIS — R944 Abnormal results of kidney function studies: Secondary | ICD-10-CM

## 2018-06-13 NOTE — Progress Notes (Signed)
Order placed for f/u labs.  

## 2018-06-15 ENCOUNTER — Telehealth: Payer: Self-pay

## 2018-06-15 NOTE — Telephone Encounter (Signed)
error 

## 2018-06-17 ENCOUNTER — Other Ambulatory Visit: Payer: Self-pay | Admitting: Internal Medicine

## 2018-06-17 ENCOUNTER — Other Ambulatory Visit: Payer: Self-pay | Admitting: Neurology

## 2018-06-29 DIAGNOSIS — J449 Chronic obstructive pulmonary disease, unspecified: Secondary | ICD-10-CM | POA: Diagnosis not present

## 2018-06-29 DIAGNOSIS — Z7901 Long term (current) use of anticoagulants: Secondary | ICD-10-CM | POA: Diagnosis not present

## 2018-06-29 DIAGNOSIS — R0602 Shortness of breath: Secondary | ICD-10-CM | POA: Diagnosis not present

## 2018-06-29 DIAGNOSIS — Z9889 Other specified postprocedural states: Secondary | ICD-10-CM | POA: Diagnosis not present

## 2018-06-29 DIAGNOSIS — E785 Hyperlipidemia, unspecified: Secondary | ICD-10-CM | POA: Diagnosis not present

## 2018-06-29 DIAGNOSIS — Z8673 Personal history of transient ischemic attack (TIA), and cerebral infarction without residual deficits: Secondary | ICD-10-CM | POA: Diagnosis not present

## 2018-06-29 DIAGNOSIS — I639 Cerebral infarction, unspecified: Secondary | ICD-10-CM | POA: Diagnosis not present

## 2018-06-29 DIAGNOSIS — I2510 Atherosclerotic heart disease of native coronary artery without angina pectoris: Secondary | ICD-10-CM | POA: Diagnosis not present

## 2018-06-29 DIAGNOSIS — I10 Essential (primary) hypertension: Secondary | ICD-10-CM | POA: Diagnosis not present

## 2018-06-30 ENCOUNTER — Other Ambulatory Visit: Payer: Self-pay

## 2018-07-03 DIAGNOSIS — J449 Chronic obstructive pulmonary disease, unspecified: Secondary | ICD-10-CM | POA: Diagnosis not present

## 2018-07-04 ENCOUNTER — Other Ambulatory Visit: Payer: Self-pay | Admitting: Internal Medicine

## 2018-07-13 ENCOUNTER — Other Ambulatory Visit: Payer: Self-pay | Admitting: Internal Medicine

## 2018-07-18 ENCOUNTER — Ambulatory Visit: Payer: Medicare PPO | Admitting: Internal Medicine

## 2018-07-20 ENCOUNTER — Inpatient Hospital Stay: Admission: RE | Admit: 2018-07-20 | Payer: Self-pay | Source: Ambulatory Visit

## 2018-08-02 DIAGNOSIS — J449 Chronic obstructive pulmonary disease, unspecified: Secondary | ICD-10-CM | POA: Diagnosis not present

## 2018-08-06 ENCOUNTER — Telehealth: Payer: Self-pay | Admitting: Neurology

## 2018-08-06 ENCOUNTER — Other Ambulatory Visit: Payer: Self-pay | Admitting: Internal Medicine

## 2018-08-09 MED ORDER — PHENYTOIN SODIUM EXTENDED 100 MG PO CAPS: ORAL_CAPSULE | ORAL | 1 refills | Status: DC

## 2018-08-09 MED ORDER — LEVETIRACETAM 750 MG PO TABS: 750.0000 mg | ORAL_TABLET | Freq: Two times a day (BID) | ORAL | 1 refills | Status: DC

## 2018-08-09 NOTE — Telephone Encounter (Signed)
Pt has a future appt set for Sept 21 , and her meds were denied due to needing an appt. Pt is calling in wanting to know if they can be filled

## 2018-08-09 NOTE — Addendum Note (Signed)
Addended by: Verlin Grills T on: 08/09/2018 09:06 AM   Modules accepted: Orders

## 2018-08-09 NOTE — Telephone Encounter (Signed)
Rx for Dilantin and Keppra has been sent pending pt's appt.

## 2018-09-02 DIAGNOSIS — J449 Chronic obstructive pulmonary disease, unspecified: Secondary | ICD-10-CM | POA: Diagnosis not present

## 2018-09-05 ENCOUNTER — Other Ambulatory Visit: Payer: Self-pay

## 2018-09-05 ENCOUNTER — Ambulatory Visit (INDEPENDENT_AMBULATORY_CARE_PROVIDER_SITE_OTHER): Payer: Medicare PPO | Admitting: Internal Medicine

## 2018-09-05 DIAGNOSIS — E7800 Pure hypercholesterolemia, unspecified: Secondary | ICD-10-CM

## 2018-09-05 DIAGNOSIS — I10 Essential (primary) hypertension: Secondary | ICD-10-CM

## 2018-09-05 DIAGNOSIS — J4540 Moderate persistent asthma, uncomplicated: Secondary | ICD-10-CM

## 2018-09-05 DIAGNOSIS — F439 Reaction to severe stress, unspecified: Secondary | ICD-10-CM

## 2018-09-05 DIAGNOSIS — G40909 Epilepsy, unspecified, not intractable, without status epilepticus: Secondary | ICD-10-CM

## 2018-09-05 DIAGNOSIS — R0902 Hypoxemia: Secondary | ICD-10-CM

## 2018-09-05 DIAGNOSIS — I509 Heart failure, unspecified: Secondary | ICD-10-CM

## 2018-09-05 DIAGNOSIS — M545 Low back pain, unspecified: Secondary | ICD-10-CM

## 2018-09-05 DIAGNOSIS — D696 Thrombocytopenia, unspecified: Secondary | ICD-10-CM

## 2018-09-05 DIAGNOSIS — I4891 Unspecified atrial fibrillation: Secondary | ICD-10-CM

## 2018-09-05 DIAGNOSIS — L4050 Arthropathic psoriasis, unspecified: Secondary | ICD-10-CM

## 2018-09-05 DIAGNOSIS — J849 Interstitial pulmonary disease, unspecified: Secondary | ICD-10-CM

## 2018-09-05 DIAGNOSIS — G629 Polyneuropathy, unspecified: Secondary | ICD-10-CM

## 2018-09-05 DIAGNOSIS — G8929 Other chronic pain: Secondary | ICD-10-CM

## 2018-09-05 DIAGNOSIS — R739 Hyperglycemia, unspecified: Secondary | ICD-10-CM

## 2018-09-05 MED ORDER — GABAPENTIN 100 MG PO CAPS: 100.0000 mg | ORAL_CAPSULE | Freq: Two times a day (BID) | ORAL | 1 refills | Status: DC

## 2018-09-05 NOTE — Progress Notes (Signed)
Patient ID: Brooke Baker, female   DOB: 05/31/1934, 84 y.o.   MRN: [id number]   Virtual Visit via telephone Note  This visit type was conducted due to national recommendations for restrictions regarding the COVID-19 pandemic (e.g. social distancing).  This format is felt to be most appropriate for this patient at this time.  All issues noted in this document were discussed and addressed.  No physical exam was performed (except for noted visual exam findings with Video Visits).   I connected with Brooke Baker by telephone and verified that I am speaking with the correct person using two identifiers. Location patient: home Location provider: work  Persons participating in the telephone visit: patient, provider  I discussed the limitations, risks, security and privacy concerns of performing an evaluation and management service by telephone and the availability of in person appointments.  The patient expressed understanding and agreed to proceed.   Reason for visit:  Scheduled follow up.   HPI: Saw cardiology 06/29/18.  Discussed chronic exertional dyspnea - which was felt to be stable and unchanged.  On lasix.  Chronic peripheral edema - stable. ECHO - EF 55% with moderate to severe tricuspid regurgitation.  On pradaxa for afib.  Stable.  No changes made.  Recommended f/u in 4 months.  She still reports some sob and swelling.  No chest pain.  No cough or congestion.  feels this is better.  No acid reflux.  No abdominal pain.  Bowels moving.  She has oxygen.  Does not use regularly.  States needs portable tank.  Oxygen - [employer]. She does describe some burning in her feet.  Increased.  Feels needs something to help with the discomfort.  Discussed gabapentin.  She is agreeable to try.  Discussed increased stress.  She has good support.  Does not feel needs anything more at this time.      ROS: See pertinent positives and negatives per HPI.  Past Medical History:  Diagnosis Date  . Acute  bronchitis   . Allergic rhinitis   . Arthritis   . CAD (coronary artery disease)    s/p stent LAD 8/03 and stent placement OM1  . Cancer ([place])    skin  . Chronic back pain   . COPD with asthma ([place])   . CVA (cerebral vascular accident) ([place])   . Diabetes mellitus ([place])   . Hypercholesterolemia   . Hypertension   . Seizure disorder [facility])     Past Surgical History:  Procedure Laterality Date  . BREAST BIOPSY Bilateral    axillas  . CORONARY STENT PLACEMENT  8/03   LAD  . CORONARY STENT PLACEMENT     OM1  2006  . FEMUR FRACTURE SURGERY Right [id number]   Dr. Marry Guan  . LUMBAR SPINE SURGERY     x3  . TOTAL ABDOMINAL HYSTERECTOMY     appendectomy - age 34  . TOTAL HIP ARTHROPLASTY    . TOTAL KNEE ARTHROPLASTY     2005    Family History  Problem Relation Age of Onset  . Allergies Brother   . Cancer Brother   . Allergies Sister   . Cancer Sister        Non-hodgkins lymphoma  . Asthma Sister   . Rheum arthritis Sister   . Stroke Mother   . Asthma Brother   . Rheum arthritis Brother   . Cancer Sister        non-hodgkins lymphoma  . Diabetes Brother     SOCIAL HX:  reviewed.    Current Outpatient Medications:  .  albuterol (PROVENTIL) (2.5 MG/3ML) 0.083% nebulizer solution, USE ONE VIAL VIA NEBULZIER EVERY FOUR HOURS AS NEEDED FOR WHEEZING (Patient taking differently: Take 2.5 mg by nebulization every 4 (four) hours as needed for wheezing or shortness of breath. ), Disp: 180 vial, Rfl: 0 .  albuterol (VENTOLIN HFA) 108 (90 Base) MCG/ACT inhaler, USE 2 PUFFS EVERY 4 HOURS AS DIRECTED - RESCUE (Patient taking differently: Inhale 2 puffs into the lungs every 4 (four) hours as needed for wheezing or shortness of breath. ), Disp: 1 Inhaler, Rfl: 6 .  amLODipine (NORVASC) 10 MG tablet, TAKE 1 TABLET BY MOUTH DAILY (Patient taking differently: Take 10 mg by mouth daily. ), Disp: 30 tablet, Rfl: 2 .  aspirin 81 MG tablet, Take 81 mg by mouth daily.  , Disp: , Rfl:  .   budesonide-formoterol (SYMBICORT) 80-4.5 MCG/ACT inhaler, INHALE 2 PUFFS TWICE A DAY (Patient taking differently: Inhale 2 puffs into the lungs 2 (two) times daily. ), Disp: 10.2 g, Rfl: 3 .  dabigatran (PRADAXA) 150 MG CAPS, Take 150 mg by mouth every 12 (twelve) hours., Disp: , Rfl:  .  fluticasone (FLONASE) 50 MCG/ACT nasal spray, USE TWO SPRAYS IN EACH NOSTRIL EACH DAY AS DIRECTED BY PHYSICIAN (Patient taking differently: Place 2 sprays into both nostrils daily. ), Disp: 16 g, Rfl: 3 .  levETIRAcetam (KEPPRA) 750 MG tablet, Take 1 tablet (750 mg total) by mouth 2 (two) times daily., Disp: 60 tablet, Rfl: 1 .  losartan (COZAAR) 100 MG tablet, TAKE 1 TABLET BY MOUTH DAILY (Patient taking differently: Take 100 mg by mouth daily. ), Disp: 90 tablet, Rfl: 1 .  metoprolol succinate (TOPROL-XL) 50 MG 24 hr tablet, TAKE 1 TABLET BY MOUTH TWICE A DAY WITH OR IMMEDIATELY FOLLOWING A MEAL (Patient taking differently: Take 50 mg by mouth 2 (two) times daily. ), Disp: 60 tablet, Rfl: 3 .  phenytoin (DILANTIN) 100 MG ER capsule, Take 4 capsules at bedtime. (Patient taking differently: Take 400 mg by mouth at bedtime. ), Disp: 120 capsule, Rfl: 1 .  potassium chloride (K-DUR) 10 MEQ tablet, TAKE 1 TABLET BY MOUTH DAILY (Patient taking differently: Take 10 mEq by mouth daily. ), Disp: 90 tablet, Rfl: 1 .  rosuvastatin (CRESTOR) 20 MG tablet, TAKE 1 TABLET BY MOUTH ONCE A DAY (Patient taking differently: Take 20 mg by mouth daily. ), Disp: 30 tablet, Rfl: 5 .  sertraline (ZOLOFT) 50 MG tablet, TAKE 1 AND ONE-HALF TABLET BY MOUTH ONCEA DAY (Patient taking differently: Take 75 mg by mouth daily. ), Disp: 45 tablet, Rfl: 5 .  furosemide (LASIX) 40 MG tablet, Take 40 mg by mouth daily., Disp: , Rfl:  .  gabapentin (NEURONTIN) 100 MG capsule, Take 1 capsule (100 mg total) by mouth 2 (two) times daily., Disp: 60 capsule, Rfl: 1  EXAM:  VITALS per patient if applicable: 244/01  GENERAL: alert.  Sounds to be in no acute  distress.  Answering questions appropriately.    PSYCH/NEURO: pleasant and cooperative, no obvious depression or anxiety, speech and thought processing grossly intact  ASSESSMENT AND PLAN:  Discussed the following assessment and plan:  Asthma, moderate persistent Followed by pulmonary.  Feels she is doing better regarding her asthma.  Follow.    Atrial fibrillation Followed by cardiology.  Stable.  On pradaxa.    CHF (congestive heart failure) (HCC) On lasix.  Saw cardiology 06/2018.  Felt stable.  Follow metabolic panel.   Chronic  back pain Stable.   Hypercholesterolemia On crestor.  Low cholesterol diet and exercise.  Follow lipid panel and liver function tests.    Hypertension Blood pressure under good control.  Continue same medication regimen.  Follow pressures.  Follow metabolic panel.    Hypoxia Desaturations as outlined in previous note.  Noted with increased ambulation.  Has O2.  Needs portable oxygen.  Contact [facility].    ILD (interstitial lung disease) ([place]) Followed by pulmonary.    Neuropathy Discussed with her today.  Feels she needs something to help with the burning.  Start low dose gabapentin 122m bid.  Follow.    [place]oriatic arthritis [place] by rheumatology.    Seizure disorder Followed by neurology.   Stress Increased stress with some family issues.  Discussed with her today.  She has good support.  Does not feel needs any further intervention.  Follow.    Hyperglycemia Low carb diet and exercise.  Follow met b and a1c.     I discussed the assessment and treatment plan with the patient. The patient was provided an opportunity to ask questions and all were answered. The patient agreed with the plan and demonstrated an understanding of the instructions.   The patient was advised to call back or seek an in-person evaluation if the symptoms worsen or if the condition fails to improve as anticipated.  I provided 22 minutes of non-face-to-face time  during this encounter.   CEinar Pheasant MD

## 2018-09-08 ENCOUNTER — Emergency Department: Payer: Medicare PPO

## 2018-09-08 ENCOUNTER — Encounter: Payer: Self-pay | Admitting: Emergency Medicine

## 2018-09-08 ENCOUNTER — Emergency Department
Admission: EM | Admit: 2018-09-08 | Discharge: 2018-09-09 | Disposition: A | Discharge status: Home / Self Care | Payer: Medicare PPO | Attending: Student in an Organized Health Care Education/Training Program | Admitting: Student in an Organized Health Care Education/Training Program

## 2018-09-08 ENCOUNTER — Other Ambulatory Visit: Payer: Self-pay

## 2018-09-08 DIAGNOSIS — M79661 Pain in right lower leg: Secondary | ICD-10-CM | POA: Diagnosis not present

## 2018-09-08 DIAGNOSIS — Z7901 Long term (current) use of anticoagulants: Secondary | ICD-10-CM | POA: Diagnosis not present

## 2018-09-08 DIAGNOSIS — M7989 Other specified soft tissue disorders: Secondary | ICD-10-CM | POA: Diagnosis not present

## 2018-09-08 DIAGNOSIS — I2510 Atherosclerotic heart disease of native coronary artery without angina pectoris: Secondary | ICD-10-CM | POA: Insufficient documentation

## 2018-09-08 DIAGNOSIS — W19XXXA Unspecified fall, initial encounter: Secondary | ICD-10-CM | POA: Diagnosis not present

## 2018-09-08 DIAGNOSIS — Y999 Unspecified external cause status: Secondary | ICD-10-CM | POA: Diagnosis not present

## 2018-09-08 DIAGNOSIS — M25561 Pain in right knee: Secondary | ICD-10-CM | POA: Diagnosis present

## 2018-09-08 DIAGNOSIS — Y93E2 Activity, laundry: Secondary | ICD-10-CM | POA: Insufficient documentation

## 2018-09-08 DIAGNOSIS — Y92018 Other place in single-family (private) house as the place of occurrence of the external cause: Secondary | ICD-10-CM | POA: Diagnosis not present

## 2018-09-08 DIAGNOSIS — S8001XA Contusion of right knee, initial encounter: Secondary | ICD-10-CM | POA: Diagnosis not present

## 2018-09-08 DIAGNOSIS — S0990XA Unspecified injury of head, initial encounter: Secondary | ICD-10-CM | POA: Diagnosis not present

## 2018-09-08 DIAGNOSIS — Z79899 Other long term (current) drug therapy: Secondary | ICD-10-CM | POA: Diagnosis not present

## 2018-09-08 DIAGNOSIS — S79911A Unspecified injury of right hip, initial encounter: Secondary | ICD-10-CM | POA: Diagnosis not present

## 2018-09-08 DIAGNOSIS — S0083XA Contusion of other part of head, initial encounter: Secondary | ICD-10-CM | POA: Diagnosis not present

## 2018-09-08 DIAGNOSIS — J449 Chronic obstructive pulmonary disease, unspecified: Secondary | ICD-10-CM | POA: Diagnosis not present

## 2018-09-08 DIAGNOSIS — I110 Hypertensive heart disease with heart failure: Secondary | ICD-10-CM | POA: Diagnosis not present

## 2018-09-08 DIAGNOSIS — Z7982 Long term (current) use of aspirin: Secondary | ICD-10-CM | POA: Insufficient documentation

## 2018-09-08 DIAGNOSIS — R52 Pain, unspecified: Secondary | ICD-10-CM | POA: Diagnosis not present

## 2018-09-08 DIAGNOSIS — I509 Heart failure, unspecified: Secondary | ICD-10-CM | POA: Insufficient documentation

## 2018-09-08 DIAGNOSIS — M25551 Pain in right hip: Secondary | ICD-10-CM | POA: Insufficient documentation

## 2018-09-08 DIAGNOSIS — W010XXA Fall on same level from slipping, tripping and stumbling without subsequent striking against object, initial encounter: Secondary | ICD-10-CM | POA: Diagnosis not present

## 2018-09-08 DIAGNOSIS — S8991XA Unspecified injury of right lower leg, initial encounter: Secondary | ICD-10-CM | POA: Diagnosis not present

## 2018-09-08 DIAGNOSIS — E119 Type 2 diabetes mellitus without complications: Secondary | ICD-10-CM | POA: Insufficient documentation

## 2018-09-08 DIAGNOSIS — M79604 Pain in right leg: Secondary | ICD-10-CM | POA: Diagnosis not present

## 2018-09-08 DIAGNOSIS — S199XXA Unspecified injury of neck, initial encounter: Secondary | ICD-10-CM | POA: Diagnosis not present

## 2018-09-08 DIAGNOSIS — I959 Hypotension, unspecified: Secondary | ICD-10-CM | POA: Diagnosis not present

## 2018-09-08 DIAGNOSIS — S72491D Other fracture of lower end of right femur, subsequent encounter for closed fracture with routine healing: Secondary | ICD-10-CM | POA: Diagnosis not present

## 2018-09-08 LAB — COMPREHENSIVE METABOLIC PANEL
ALT: 16 U/L (ref 0–44)
AST: 36 U/L (ref 15–41)
Albumin: 3.2 g/dL — ABNORMAL LOW (ref 3.5–5.0)
Alkaline Phosphatase: 143 U/L — ABNORMAL HIGH (ref 38–126)
Anion gap: 9 (ref 5–15)
BUN: 28 mg/dL — ABNORMAL HIGH (ref 8–23)
CO2: 20 mmol/L — ABNORMAL LOW (ref 22–32)
Calcium: 8.3 mg/dL — ABNORMAL LOW (ref 8.9–10.3)
Chloride: 113 mmol/L — ABNORMAL HIGH (ref 98–111)
Creatinine, Ser: 1.15 mg/dL — ABNORMAL HIGH (ref 0.44–1.00)
GFR calc Af Amer: 51 mL/min — ABNORMAL LOW (ref >60)
GFR calc non Af Amer: 44 mL/min — ABNORMAL LOW (ref >60)
Glucose, Bld: 129 mg/dL — ABNORMAL HIGH (ref 70–99)
Potassium: 4.5 mmol/L (ref 3.5–5.1)
Sodium: 142 mmol/L (ref 135–145)
Total Bilirubin: 0.8 mg/dL (ref 0.3–1.2)
Total Protein: 7.7 g/dL (ref 6.5–8.1)

## 2018-09-08 LAB — CBC WITH DIFFERENTIAL/PLATELET
Abs Immature Granulocytes: 0.04 10*3/uL (ref 0.00–0.07)
Basophils Absolute: 0 10*3/uL (ref 0.0–0.1)
Basophils Relative: 0 %
Eosinophils Absolute: 0.6 10*3/uL — ABNORMAL HIGH (ref 0.0–0.5)
Eosinophils Relative: 10 %
HCT: 35 % — ABNORMAL LOW (ref 36.0–46.0)
Hemoglobin: 11 g/dL — ABNORMAL LOW (ref 12.0–15.0)
Immature Granulocytes: 1 %
Lymphocytes Relative: 14 %
Lymphs Abs: 0.8 10*3/uL (ref 0.7–4.0)
MCH: 34 pg (ref 26.0–34.0)
MCHC: 31.4 g/dL (ref 30.0–36.0)
MCV: 108 fL — ABNORMAL HIGH (ref 80.0–100.0)
Monocytes Absolute: 0.6 10*3/uL (ref 0.1–1.0)
Monocytes Relative: 10 %
Neutro Abs: 3.7 10*3/uL (ref 1.7–7.7)
Neutrophils Relative %: 65 %
Platelets: 104 10*3/uL — ABNORMAL LOW (ref 150–400)
RBC: 3.24 MIL/uL — ABNORMAL LOW (ref 3.87–5.11)
RDW: 12.8 % (ref 11.5–15.5)
WBC: 5.7 10*3/uL (ref 4.0–10.5)
nRBC: 0 % (ref 0.0–0.2)

## 2018-09-08 LAB — URINALYSIS, COMPLETE (UACMP) WITH MICROSCOPIC
Bilirubin Urine: NEGATIVE
Glucose, UA: NEGATIVE mg/dL
Ketones, ur: NEGATIVE mg/dL
Leukocytes,Ua: NEGATIVE
Nitrite: NEGATIVE
Protein, ur: NEGATIVE mg/dL
Specific Gravity, Urine: 1.01 (ref 1.005–1.030)
pH: 6 (ref 5.0–8.0)

## 2018-09-08 MED ORDER — METOPROLOL SUCCINATE ER 50 MG PO TB24
50.0000 mg | ORAL_TABLET | Freq: Every day | ORAL | Status: DC
  Administered 2018-09-09: 50 mg via ORAL
  Filled 2018-09-08: qty 1

## 2018-09-08 MED ORDER — FENTANYL CITRATE (PF) 100 MCG/2ML IJ SOLN
50.0000 ug | INTRAMUSCULAR | Status: DC | PRN
  Administered 2018-09-08 – 2018-09-09 (×3): 50 ug via INTRAVENOUS
  Filled 2018-09-08 (×3): qty 2

## 2018-09-08 MED ORDER — LEVETIRACETAM 750 MG PO TABS
750.0000 mg | ORAL_TABLET | Freq: Two times a day (BID) | ORAL | Status: DC
  Administered 2018-09-09 (×2): 750 mg via ORAL
  Filled 2018-09-08 (×3): qty 1

## 2018-09-08 MED ORDER — AMLODIPINE BESYLATE 5 MG PO TABS
10.0000 mg | ORAL_TABLET | Freq: Every day | ORAL | Status: DC
  Administered 2018-09-09: 10 mg via ORAL
  Filled 2018-09-08: qty 2

## 2018-09-08 MED ORDER — ASPIRIN 81 MG PO CHEW
81.0000 mg | CHEWABLE_TABLET | Freq: Every day | ORAL | Status: DC
  Administered 2018-09-09: 81 mg via ORAL
  Filled 2018-09-08: qty 1

## 2018-09-08 MED ORDER — LOSARTAN POTASSIUM 50 MG PO TABS
100.0000 mg | ORAL_TABLET | Freq: Every day | ORAL | Status: DC
  Administered 2018-09-09: 100 mg via ORAL
  Filled 2018-09-08: qty 2

## 2018-09-08 MED ORDER — ALBUTEROL SULFATE (2.5 MG/3ML) 0.083% IN NEBU: 2.5000 mg | INHALATION_SOLUTION | RESPIRATORY_TRACT | Status: DC | PRN

## 2018-09-08 NOTE — ED Notes (Signed)
IV team in patient's room.

## 2018-09-08 NOTE — ED Triage Notes (Signed)
Pt in via [facility] from home, reports mechanical fall while doing laundry, hitting head on a table, presents with right hip and femur pain, shortening noted.  Pt also with hematoma to right frontal head.  Pt denies LOC, A/Ox4 at this time, NAD noted.

## 2018-09-08 NOTE — ED Notes (Signed)
Patient transported to X-ray 

## 2018-09-08 NOTE — ED Notes (Signed)
Patient transported to CT 

## 2018-09-08 NOTE — ED Notes (Signed)
Patient used walker to ambulate 5 feet. Patient unable to ambulate further.

## 2018-09-08 NOTE — ED Notes (Signed)
IV team leaving patient room.

## 2018-09-08 NOTE — ED Provider Notes (Signed)
[facility] Emergency Department Provider Note    First MD Initiated Contact with Patient 09/08/18 1741     (approximate)  I have reviewed the triage vital signs and the nursing notes.   HISTORY  Chief Complaint Fall    HPI Brooke Baker is a 84 y.o. female develops a past medical history presents to the ER after mechanical fall.  Patient was doing laundry tripped and fell landing on her right knee and right hip.  Did not hit her head.  Is on blood thinner.  Denies any LOC.  Denies any numbness or tingling.  Was not able to walk since the fall.  Lives at home.    Past Medical History:  Diagnosis Date   Acute bronchitis    Allergic rhinitis    Arthritis    CAD (coronary artery disease)    s/p stent LAD 8/03 and stent placement OM1   Cancer ([place])    skin   Chronic back pain    COPD with asthma ([place])    CVA (cerebral vascular accident) ([place])    Diabetes mellitus ([place])    Hypercholesterolemia    Hypertension    Seizure disorder ([place])    Family History  Problem Relation Age of Onset   Allergies Brother    Cancer Brother    Allergies Sister    Cancer Sister        Non-hodgkins lymphoma   Asthma Sister    Rheum arthritis Sister    Stroke Mother    Asthma Brother    Rheum arthritis Brother    Cancer Sister        non-hodgkins lymphoma   Diabetes Brother    Past Surgical History:  Procedure Laterality Date   BREAST BIOPSY Bilateral    axillas   CORONARY STENT PLACEMENT  8/03   LAD   CORONARY STENT PLACEMENT     OM1  2006   FEMUR FRACTURE SURGERY Right [id number]   Dr. Marry Guan   LUMBAR SPINE SURGERY     x3   TOTAL ABDOMINAL HYSTERECTOMY     appendectomy - age 50   TOTAL HIP ARTHROPLASTY     TOTAL KNEE ARTHROPLASTY     2005   Patient Active Problem List   Diagnosis Date Noted   ILD (interstitial lung disease) ([place]) 01/14/2018   CHF (congestive heart failure) ([place]) 07/01/2017   Hypoxia 07/01/2017     Therapeutic drug monitoring 03/25/2017   BMI 39.0-39.9,adult 03/07/2017   Falls frequently 03/18/2016   Psoriatic arthritis ([place]) 05/14/2015   Health care maintenance 10/31/2014   Stress 12/03/2013   Obesity 08/01/2013   Neuropathy 08/01/2013   Macrocytosis 08/01/2013   Hypertension 12/18/2011   Chronic back pain 12/18/2011   Seizure disorder ([place]) 12/18/2011   Atrial fibrillation ([place]) 12/18/2011   Hypercholesterolemia 12/18/2011   Asthma, moderate persistent 04/08/2009   Coronary atherosclerosis 04/19/2007   History of CVA (cerebrovascular accident) 04/19/2007   ASTHMATIC BRONCHITIS, ACUTE 04/19/2007   Seasonal and perennial allergic rhinitis 04/19/2007      Prior to Admission medications   Medication Sig Start Date End Date Taking? Authorizing Provider  albuterol (PROVENTIL) (2.5 MG/3ML) 0.083% nebulizer solution USE ONE VIAL VIA NEBULZIER EVERY FOUR HOURS AS NEEDED FOR WHEEZING 01/28/16   Baird Lyons D, MD  albuterol (VENTOLIN HFA) 108 (90 Base) MCG/ACT inhaler USE 2 PUFFS EVERY 4 HOURS AS DIRECTED - RESCUE 01/12/18   Baird Lyons D, MD  amLODipine (NORVASC) 10 MG tablet TAKE 1 TABLET BY  MOUTH DAILY 07/13/18   Einar Pheasant, MD  aspirin 81 MG tablet Take 81 mg by mouth daily.      [provider]  budesonide-formoterol (SYMBICORT) 80-4.5 MCG/ACT inhaler INHALE 2 PUFFS TWICE A DAY 06/08/18   Baird Lyons D, MD  dabigatran (PRADAXA) 150 MG CAPS Take 150 mg by mouth every 12 (twelve) hours.    [provider]  fluticasone (FLONASE) 50 MCG/ACT nasal spray USE TWO SPRAYS IN EACH NOSTRIL EACH DAY AS DIRECTED BY PHYSICIAN 03/02/17   Einar Pheasant, MD  furosemide (LASIX) 40 MG tablet Take 1 tablet by mouth daily. 06/30/17 06/30/18  [provider]  gabapentin (NEURONTIN) 100 MG capsule Take 1 capsule (100 mg total) by mouth 2 (two) times daily. 09/05/18   Einar Pheasant, MD  levETIRAcetam (KEPPRA) 750 MG tablet Take 1 tablet (750 mg  total) by mouth 2 (two) times daily. 08/09/18   Kathrynn Ducking, MD  losartan (COZAAR) 100 MG tablet TAKE 1 TABLET BY MOUTH DAILY 08/08/18   Einar Pheasant, MD  metoprolol succinate (TOPROL-XL) 50 MG 24 hr tablet TAKE 1 TABLET BY MOUTH TWICE A DAY WITH OR IMMEDIATELY FOLLOWING A MEAL 07/13/18   Einar Pheasant, MD  phenytoin (DILANTIN) 100 MG ER capsule Take 4 capsules at bedtime. 08/09/18   Kathrynn Ducking, MD  potassium chloride (K-DUR) 10 MEQ tablet TAKE 1 TABLET BY MOUTH DAILY 08/08/18   Einar Pheasant, MD  rosuvastatin (CRESTOR) 20 MG tablet TAKE 1 TABLET BY MOUTH ONCE A DAY 07/05/18   Einar Pheasant, MD  sertraline (ZOLOFT) 50 MG tablet TAKE 1 AND ONE-HALF TABLET BY MOUTH ONCEA DAY 06/17/18   Crecencio Mc, MD  traMADol Veatrice Bourbon) 50 MG tablet 1 or 2 every 8 hours if needed for cough 04/30/17   Deneise Lever, MD    Allergies Doxycycline and Methotrexate    Social History Social History   Tobacco Use   Smoking status: Never Smoker   Smokeless tobacco: Never Used  Substance Use Topics   Alcohol use: No    Alcohol/week: 0.0 standard drinks   Drug use: No    Review of Systems Patient denies headaches, rhinorrhea, blurry vision, numbness, shortness of breath, chest pain, edema, cough, abdominal pain, nausea, vomiting, diarrhea, dysuria, fevers, rashes or hallucinations unless otherwise stated above in HPI. ____________________________________________   PHYSICAL EXAM:  VITAL SIGNS: Vitals:   09/08/18 1740 09/08/18 2143  BP: 136/65 (!) 152/67  Pulse: 74 85  Resp:  18  Temp: 98.7 F (37.1 C)   SpO2: 94% 96%    Constitutional: Alert and oriented.  Eyes: Conjunctivae are normal.  Head: Atraumatic. Nose: No congestion/rhinnorhea. Mouth/Throat: Mucous membranes are moist.   Neck: No stridor. Painless ROM.  Cardiovascular: Normal rate, regular rhythm. Grossly normal heart sounds.  Good peripheral circulation. Respiratory: Normal respiratory effort.  No retractions. Lungs  CTAB. Gastrointestinal: Soft and nontender. No distention. No abdominal bruits. No CVA tenderness. Genitourinary:  Musculoskeletal: Contusion to right knee and some pain with palpation of the right hip.  Thigh compartments soft.  Neuro/Vascular intact distally. no joint effusions. Neurologic:  Normal speech and language. No gross focal neurologic deficits are appreciated. No facial droop Skin:  Skin is warm, dry and intact. No rash noted. Psychiatric: Mood and affect are normal. Speech and behavior are normal.  ____________________________________________   LABS (all labs ordered are listed, but only abnormal results are displayed)  Results for orders placed or performed during the hospital encounter of 09/08/18 (from the past 24  hour(s))  CBC with Differential/Platelet     Status: Abnormal   Collection Time: 09/08/18  6:54 PM  Result Value Ref Range   WBC 5.7 4.0 - 10.5 K/uL   RBC 3.24 (L) 3.87 - 5.11 MIL/uL   Hemoglobin 11.0 (L) 12.0 - 15.0 g/dL   HCT 35.0 (L) 36.0 - 46.0 %   MCV 108.0 (H) 80.0 - 100.0 fL   MCH 34.0 26.0 - 34.0 pg   MCHC 31.4 30.0 - 36.0 g/dL   RDW 12.8 11.5 - 15.5 %   Platelets 104 (L) 150 - 400 K/uL   nRBC 0.0 0.0 - 0.2 %   Neutrophils Relative % 65 %   Neutro Abs 3.7 1.7 - 7.7 K/uL   Lymphocytes Relative 14 %   Lymphs Abs 0.8 0.7 - 4.0 K/uL   Monocytes Relative 10 %   Monocytes Absolute 0.6 0.1 - 1.0 K/uL   Eosinophils Relative 10 %   Eosinophils Absolute 0.6 (H) 0.0 - 0.5 K/uL   Basophils Relative 0 %   Basophils Absolute 0.0 0.0 - 0.1 K/uL   Immature Granulocytes 1 %   Abs Immature Granulocytes 0.04 0.00 - 0.07 K/uL  Comprehensive metabolic panel     Status: Abnormal   Collection Time: 09/08/18  6:54 PM  Result Value Ref Range   Sodium 142 135 - 145 mmol/L   Potassium 4.5 3.5 - 5.1 mmol/L   Chloride 113 (H) 98 - 111 mmol/L   CO2 20 (L) 22 - 32 mmol/L   Glucose, Bld 129 (H) 70 - 99 mg/dL   BUN 28 (H) 8 - 23 mg/dL   Creatinine, Ser 1.15 (H) 0.44 -  1.00 mg/dL   Calcium 8.3 (L) 8.9 - 10.3 mg/dL   Total Protein 7.7 6.5 - 8.1 g/dL   Albumin 3.2 (L) 3.5 - 5.0 g/dL   AST 36 15 - 41 U/L   ALT 16 0 - 44 U/L   Alkaline Phosphatase 143 (H) 38 - 126 U/L   Total Bilirubin 0.8 0.3 - 1.2 mg/dL   GFR calc non Af Amer 44 (L) >60 mL/min   GFR calc Af Amer 51 (L) >60 mL/min   Anion gap 9 5 - 15  Urinalysis, Complete w Microscopic     Status: Abnormal   Collection Time: 09/08/18  9:44 PM  Result Value Ref Range   Color, Urine YELLOW (A) YELLOW   APPearance HAZY (A) CLEAR   Specific Gravity, Urine 1.010 1.005 - 1.030   pH 6.0 5.0 - 8.0   Glucose, UA NEGATIVE NEGATIVE mg/dL   Hgb urine dipstick SMALL (A) NEGATIVE   Bilirubin Urine NEGATIVE NEGATIVE   Ketones, ur NEGATIVE NEGATIVE mg/dL   Protein, ur NEGATIVE NEGATIVE mg/dL   Nitrite NEGATIVE NEGATIVE   Leukocytes,Ua NEGATIVE NEGATIVE   RBC / HPF 0-5 0 - 5 RBC/hpf   WBC, UA 0-5 0 - 5 WBC/hpf   Bacteria, UA RARE (A) NONE SEEN   Squamous Epithelial / LPF 0-5 0 - 5   Mucus PRESENT    Hyaline Casts, UA PRESENT    ____________________________________________ ____________________________________________  RADIOLOGY  I personally reviewed all radiographic images ordered to evaluate for the above acute complaints and reviewed radiology reports and findings.  These findings were personally discussed with the patient.  Please see medical record for radiology report.  ____________________________________________   PROCEDURES  Procedure(s) performed:  Procedures    Critical Care performed: no ____________________________________________   INITIAL IMPRESSION / ASSESSMENT AND PLAN / ED COURSE  Pertinent labs & imaging results that were available during my care of the patient were reviewed by me and considered in my medical decision making (see chart for details).   DDX: Fracture, contusion, dislocation, electrolyte abnormality, SDH, ED age, IPH  Brooke Baker is a 84 y.o. who presents  to the ED with fall with right hip and leg pain and head injury as described above.  Radiographs and CT imaging will be ordered for the above differential.  Pack marks of soft.  Neurovascular intact distally.  Clinical Course as of Sep 07 2232  Thu Sep 08, 2018  2034 X-rays do not show any evidence of fracture.  Will ambulate with walker is is was used to get around at home.   [PR]    Clinical Course User Index [PR] Merlyn Lot, MD  Patient reassessed.  Patient feels very weak with ambulation even with her walker.  Does not feel comfortable going back home.  Will place consult PT social work.  The patient was evaluated in Emergency Department today for the symptoms described in the history of present illness. He/she was evaluated in the context of the global COVID-19 pandemic, which necessitated consideration that the patient might be at risk for infection with the SARS-CoV-2 virus that causes COVID-19. Institutional protocols and algorithms that pertain to the evaluation of patients at risk for COVID-19 are in a state of rapid change based on information released by regulatory bodies including the CDC and federal and state organizations. These policies and algorithms were followed during the patient's care in the ED.  As part of my medical decision making, I reviewed the following data within the [facility] notes reviewed and incorporated, Labs reviewed, notes from prior ED visits and [place] Controlled Substance Database   ____________________________________________   FINAL CLINICAL IMPRESSION(S) / ED DIAGNOSES  Final diagnoses:  Fall, initial encounter      NEW MEDICATIONS STARTED DURING THIS VISIT:  New Prescriptions   No medications on file     Note:  This document was prepared using Dragon voice recognition software and may include unintentional dictation errors.    Merlyn Lot, MD 09/08/18 2234

## 2018-09-09 ENCOUNTER — Other Ambulatory Visit: Payer: Self-pay

## 2018-09-09 DIAGNOSIS — R279 Unspecified lack of coordination: Secondary | ICD-10-CM | POA: Diagnosis not present

## 2018-09-09 DIAGNOSIS — Z743 Need for continuous supervision: Secondary | ICD-10-CM | POA: Diagnosis not present

## 2018-09-09 DIAGNOSIS — R5381 Other malaise: Secondary | ICD-10-CM | POA: Diagnosis not present

## 2018-09-09 NOTE — Progress Notes (Signed)
CSW received consult. CSW awaiting patient to be seen by PT for recommendation HH vs SNF.   [place], LCSW Transitions of Care Department [facility] ED [phone]

## 2018-09-09 NOTE — ED Notes (Addendum)
Given breakfast tray, independently eating. Pt resting comfortably.

## 2018-09-09 NOTE — ED Notes (Signed)
Pt awake and alert in room, given water per request. Awaiting breakfast tray to arrive. Engaged pt in conversation regarding home life, family, etc. Informed pt that her daughter called and is coming to see her. Pt gives permission to share information with daughter.  Pt clean and dry, pure wick placed correctly on patient with output of 800cc yellow urine in canister. Informed pt that AM mediations would be given today. Attempted to give patient new linens and gown, states "they are clean and I am going home today"-denies. Offered to assist or give patient peri care and bath wipes, pt states that she would rather bathe herself at home today when she is discharged. Pt alert and oriented X 4, appears clean and dry-appropriate to request that she do this at home instead of ER. Attempted to give patient physical exercise by getting out of bed, but pt states that she is unable to walk; pt continues to await PT consult.

## 2018-09-09 NOTE — ED Notes (Signed)
Daughter at bedside, brought pt breakfast. Pt did not eat hospital provided breakfast, had 4 oz apple juice. Daughter reports pt was recently placed on oxygen by order of her doctor and has a portable oxygen tank ordered. Social work to call daughter and talk about disposition.

## 2018-09-09 NOTE — ED Notes (Signed)
Spoke with pt daughter regarding pt status.  

## 2018-09-09 NOTE — ED Notes (Signed)
PT at bedside.

## 2018-09-09 NOTE — Evaluation (Signed)
Physical Therapy Evaluation Patient Details Name: Brooke Baker MRN: [id number] DOB: [birth year] Today's Date: 09/09/2018   History of Present Illness  Patient is 84 yo female that arrived at the ED from home after a fall. PMH of CAD, cancer, COPD, CVA, DM, HTN, seizures, afib    Clinical Impression  The patient was very pleasant and A&Ox4. Provided clear PLOF information; stated she lives in a one story home with her husband who is available 24/7, previously did not need assistance with ADLs, ambulates in home with a three wheeled walker, SPC for community ambulation, family performs homemaking duties. Endorsed 2 falls in the last 6 months.  Upon assessment the patient was unable to move RLE independently against gravity and reported significant pain with mobility throughout session. LLE WFLs. Supine <> sit minA for LE management and modA to return to supine for trunk control and LE management as well. Sit <> stand x3 trials this session, min-modA to achieve standing with heavy reliance on RW, and min-modA to maintain initial standing balance. Side stepping at bedside attempted, pt unable to clear RLE from floor and experienced buckling with weight bearing.  Overall the patient demonstrated deficits (see "PT Problem List") that impede the patient's functional abilities, safety, and mobility and would benefit from skilled PT intervention. Recommendation is STR due to acute decline in functional status and current level of assistance needed.     Follow Up Recommendations SNF    Equipment Recommendations  Other (comment)(TBD at next venue of care)    Recommendations for Other Services       Precautions / Restrictions Precautions Precautions: Fall Restrictions Weight Bearing Restrictions: No      Mobility  Bed Mobility Overal bed mobility: Needs Assistance Bed Mobility: Sit to Supine;Supine to Sit     Supine to sit: Min assist;HOB elevated Sit to supine: Mod assist;HOB elevated    General bed mobility comments: minA for RLE management in supine to sit, sit to supine modA for RLE management and trunk control  Transfers Overall transfer level: Needs assistance Equipment used: Rolling walker (2 wheeled) Transfers: Sit to/from Stand Sit to Stand: Min assist;Mod assist;From elevated surface         General transfer comment: Pt with buckling and poor initial standing balance  Ambulation/Gait   Gait Distance (Feet): 2 Feet Assistive device: Rolling walker (2 wheeled)       General Gait Details: lateral stepping attempted at EOB for safety. Pt with significant difficulty clearing RLE for step, and buckling noted with weight bearing. Significant complaints of RLE pain with mobility.  Stairs            Wheelchair Mobility    Modified Rankin (Stroke Patients Only)       Balance Overall balance assessment: Needs assistance Sitting-balance support: Feet supported Sitting balance-Leahy Scale: Fair       Standing balance-Leahy Scale: Poor                               Pertinent Vitals/Pain Pain Assessment: No/denies pain    Home Living Family/patient expects to be discharged to:: Private residence Living Arrangements: Spouse/significant other Available Help at Discharge: Family;Available 24 hours/day Type of Home: House Home Access: Ramped entrance     Home Layout: One level Home Equipment: Wheelchair - manual;Cane - single point;Walker - 4 wheels;Grab bars - toilet;Bedside commode(3 wheeled walker, lift chair) Additional Comments: 3 wheeled walker, SPC for community ambulation, goes out  for a lot of their meals    Prior Function Level of Independence: Needs assistance   Gait / Transfers Assistance Needed: tripod walker in, SPC in community  ADL's / Homemaking Assistance Needed: daughter cleans house, husband does chores  Comments: 2 falls in the last 6 months     Hand Dominance   Dominant Hand: Right     Extremity/Trunk Assessment   Upper Extremity Assessment Upper Extremity Assessment: Generalized weakness    Lower Extremity Assessment Lower Extremity Assessment: RLE deficits/detail;LLE deficits/detail RLE Deficits / Details: Pt unable to lift RLE off of bed or bed knee independently LLE Deficits / Details: able to perform SLR and heel slide independently       Communication   Communication: No difficulties  Cognition Arousal/Alertness: Awake/alert Behavior During Therapy: WFL for tasks assessed/performed Overall Cognitive Status: Within Functional Limits for tasks assessed                                        General Comments      Exercises     Assessment/Plan    PT Assessment Patient needs continued PT services  PT Problem List Decreased strength;Decreased mobility;Decreased range of motion;Decreased activity tolerance;Decreased balance;Pain       PT Treatment Interventions DME instruction;Therapeutic exercise;Gait training;Balance training;Stair training;Neuromuscular re-education;Functional mobility training;Therapeutic activities;Patient/family education    PT Goals (Current goals can be found in the Care Plan section)  Acute Rehab PT Goals Patient Stated Goal: to go home PT Goal Formulation: With patient Time For Goal Achievement: 09/23/18 Potential to Achieve Goals: Fair    Frequency Min 2X/week   Barriers to discharge        Co-evaluation               AM-PAC PT "6 Clicks" Mobility  Outcome Measure Help needed turning from your back to your side while in a flat bed without using bedrails?: A Lot Help needed moving from lying on your back to sitting on the side of a flat bed without using bedrails?: A Lot Help needed moving to and from a bed to a chair (including a wheelchair)?: A Lot Help needed standing up from a chair using your arms (e.g., wheelchair or bedside chair)?: A Lot Help needed to walk in hospital room?:  Total Help needed climbing 3-5 steps with a railing? : Total 6 Click Score: 10    End of Session Equipment Utilized During Treatment: Gait belt Activity Tolerance: Patient limited by pain Patient left: in bed;with call bell/phone within reach Nurse Communication: Mobility status PT Visit Diagnosis: Other abnormalities of gait and mobility (R26.89);Muscle weakness (generalized) (M62.81);Difficulty in walking, not elsewhere classified (R26.2);Pain;Unsteadiness on feet (R26.81) Pain - Right/Left: Right Pain - part of body: Leg    Time: [id number] PT Time Calculation (min) (ACUTE ONLY): 32 min   Charges:   PT Evaluation $PT Eval Low Complexity: 1 Low PT Treatments $Therapeutic Activity: 8-22 mins        Lieutenant Diego PT, DPT 10:15 AM,09/09/18 [phone]

## 2018-09-09 NOTE — ED Notes (Signed)
Patient's O2 sat at 86. Patient placed on 4liters of oxygen.

## 2018-09-09 NOTE — ED Provider Notes (Signed)
PT evaluated the patient and recommended SNF placement after discussion with daughter they prefer to go home.  Social work came and talked with the family.  We will help set up home health care.  Given this patient will be discharged back to home.    Vanessa [place], MD 09/09/18 1314

## 2018-09-09 NOTE — ED Notes (Signed)
Daughter at bedside, meal tray provided.

## 2018-09-09 NOTE — ED Notes (Signed)
Report given to Ally, RN 

## 2018-09-09 NOTE — Discharge Planning (Signed)
Per EDSW:   HH Arranged: RN, PT, OT, Nurse's Aide, Social Work [employer] Agency: [employer] (now [facility]) Date [place]: 09/09/18 Time [place]: [place] Representative spoke with at [place]: Drue Novel

## 2018-09-09 NOTE — Discharge Instructions (Signed)
PT came and evaluated you and recommended SNF placement.  However instead we are going to set up home health at home.  Return to the ER for any other concerns

## 2018-09-09 NOTE — TOC Initial Note (Signed)
Transition of Care [facility]) - Initial/Assessment Note    Patient Details  Name: Brooke Baker MRN: [id number] Date of Birth: 06-18-1934  Transition of Care [facility]) CM/SW Contact:    Janace Hoard, LCSW Phone Number: 09/09/2018, 10:35a  Clinical Narrative:                 CSW spoke with patient's daughter, [name], after PT recommended SNF. Tammy reports that she feels patient will be more happier with [facility] services and that patient will not do well as a SNF having to be alone for 21-30 days.   CSW contacted Drue Novel with [facility] and will accept her. CSW made Tammy aware and Tammy was agreeable.   Tammy requested patient be transported via PTAR due to not being able to ambulate well. CSW explained that the cost may not be covered by her insurance and Tammy was okay with this.  CSW contacted RN to set up PTAR for patient.   Expected Discharge Plan: [place] Barriers to Discharge: No Barriers Identified   Patient Goals and CMS Choice Patient states their goals for this hospitalization and ongoing recovery are:: per daughter, [facility] with PT CMS Medicare.gov Compare Post Acute Care list provided to:: Patient Represenative (must comment) Choice offered to / list presented to : Adult Children  Expected Discharge Plan and Services Expected Discharge Plan: [place] In-house Referral: Clinical Social Work   Post Acute Care Choice: [place] arrangements for the past 2 months: [place]: RN, PT, OT, Nurse's Aide, Social Work [employer] Agency: [employer] (now [facility]) Date [place]: 09/09/18 Time [place]: 1245 Representative spoke with at [place]: Drue Novel  Prior Living Arrangements/Services Living arrangements for the past 2 months: [place] with:: Spouse Patient language and need for interpreter reviewed:: Yes Do you feel  safe going back to the place where you live?: Yes      Need for Family Participation in Patient Care: Yes (Comment) Care giver support system in place?: Yes (comment) Current home services: DME Criminal Activity/Legal Involvement Pertinent to Current Situation/Hospitalization: No - Comment as needed  Activities of Daily Living      Permission Sought/Granted Permission sought to share information with : Family Supports Permission granted to share information with : Yes, Verbal Permission Granted  Share Information with NAME: Juanda Bond  Permission granted to share info w AGENCY: [place] granted to share info w Relationship: daughter  Permission granted to share info w Contact Information: [phone] (daughter)  Emotional Assessment Appearance:: (unable to assess) Attitude/Demeanor/Rapport: Unable to Assess Affect (typically observed): Unable to Assess Orientation: : Oriented to Self, Oriented to Place, Oriented to  Time, Oriented to Situation      Admission diagnosis:  fall Patient Active Problem List   Diagnosis Date Noted  . ILD (interstitial lung disease) ([place]) 01/14/2018  . CHF (congestive heart failure) ([place]) 07/01/2017  . Hypoxia 07/01/2017  . Therapeutic drug monitoring 03/25/2017  . BMI 39.0-39.9,adult 03/07/2017  . Falls frequently 03/18/2016  . Psoriatic arthritis ([place]) 05/14/2015  . Health care maintenance 10/31/2014  . Stress 12/03/2013  . Obesity 08/01/2013  . Neuropathy 08/01/2013  . Macrocytosis 08/01/2013  . Hypertension 12/18/2011  . Chronic back pain 12/18/2011  .  Seizure disorder ([place]) 12/18/2011  . Atrial fibrillation ([place]) 12/18/2011  . Hypercholesterolemia 12/18/2011  . Asthma, moderate persistent 04/08/2009  . Coronary atherosclerosis 04/19/2007  . History of CVA (cerebrovascular accident) 04/19/2007  . ASTHMATIC BRONCHITIS, ACUTE 04/19/2007  . Seasonal and perennial allergic rhinitis 04/19/2007   PCP:  Einar Pheasant,  MD Pharmacy:   [facility] [place] [address] Phone: [phone] Fax: [place], [place] [address] [place] [address] Phone: [phone] Fax: [place], [place] [address] Phone: [phone] Fax: [phone]     Social Determinants of Health (SDOH) Interventions    Readmission Risk Interventions No flowsheet data found.

## 2018-09-11 ENCOUNTER — Encounter: Payer: Self-pay | Admitting: Internal Medicine

## 2018-09-11 DIAGNOSIS — D696 Thrombocytopenia, unspecified: Secondary | ICD-10-CM | POA: Insufficient documentation

## 2018-09-11 DIAGNOSIS — R739 Hyperglycemia, unspecified: Secondary | ICD-10-CM | POA: Insufficient documentation

## 2018-09-11 NOTE — Assessment & Plan Note (Signed)
Followed by pulmonary 

## 2018-09-11 NOTE — Assessment & Plan Note (Signed)
Followed by rheumatology. 

## 2018-09-11 NOTE — Assessment & Plan Note (Signed)
Blood pressure under good control.  Continue same medication regimen.  Follow pressures.  Follow metabolic panel.   

## 2018-09-11 NOTE — Assessment & Plan Note (Signed)
Discussed with her today.  Feels she needs something to help with the burning.  Start low dose gabapentin 100mg  bid.  Follow.

## 2018-09-11 NOTE — Assessment & Plan Note (Signed)
Stable

## 2018-09-11 NOTE — Assessment & Plan Note (Signed)
On lasix.  Saw cardiology 06/2018.  Felt stable.  Follow metabolic panel.

## 2018-09-11 NOTE — Assessment & Plan Note (Signed)
On crestor.  Low cholesterol diet and exercise.  Follow lipid panel and liver function tests.   

## 2018-09-11 NOTE — Assessment & Plan Note (Signed)
Low carb diet and exercise.  Follow met b and a1c.  

## 2018-09-11 NOTE — Assessment & Plan Note (Signed)
Followed by pulmonary.  Feels she is doing better regarding her asthma.  Follow.

## 2018-09-11 NOTE — Assessment & Plan Note (Signed)
Followed by cardiology.  Stable.  On pradaxa.   

## 2018-09-11 NOTE — Assessment & Plan Note (Signed)
Increased stress with some family issues.  Discussed with her today.  She has good support.  Does not feel needs any further intervention.  Follow.

## 2018-09-11 NOTE — Assessment & Plan Note (Signed)
Desaturations as outlined in previous note.  Noted with increased ambulation.  Has O2.  Needs portable oxygen.  Contact [employer].

## 2018-09-11 NOTE — Assessment & Plan Note (Signed)
Followed by neurology.   

## 2018-09-13 ENCOUNTER — Telehealth: Payer: Self-pay | Admitting: Internal Medicine

## 2018-09-13 ENCOUNTER — Telehealth: Payer: Self-pay

## 2018-09-13 NOTE — Telephone Encounter (Signed)
See other note

## 2018-09-13 NOTE — Telephone Encounter (Signed)
Pt daughter called about possible UTI. Pt fell confined to bed. Symp are urine very strong, Orange color,no pain, some burning. Please advise? Pt thought said she felt like she was laying from the ceiling.  Call pt daughter [phone]@ [phone]. Thank you!

## 2018-09-13 NOTE — Telephone Encounter (Signed)
Spoke with daughter. Pt should not be confined to the bed. Urine was ok at hospital. Stated she was not able to get up and give urine sample. STAT referral for home health was put in. Advised daughter that I would reach out and see when they are planning to come out. Advised if pt has any new or worsening symptoms, she will need to go back to ED or UC

## 2018-09-13 NOTE — Telephone Encounter (Signed)
Called daughter to let her know that home health will be out tomorrow

## 2018-09-13 NOTE — Telephone Encounter (Signed)
Copied from [place] [phone]. Topic: General - Inquiry >> Sep 13, 2018  3:22 PM Virl Axe D wrote: Reason for CRM: Kisa with [facility] stated they will be able to start services with pt starting 09/14/18. Please advise

## 2018-09-14 DIAGNOSIS — J4540 Moderate persistent asthma, uncomplicated: Secondary | ICD-10-CM | POA: Diagnosis not present

## 2018-09-14 DIAGNOSIS — G40909 Epilepsy, unspecified, not intractable, without status epilepticus: Secondary | ICD-10-CM | POA: Diagnosis not present

## 2018-09-14 DIAGNOSIS — I2510 Atherosclerotic heart disease of native coronary artery without angina pectoris: Secondary | ICD-10-CM | POA: Diagnosis not present

## 2018-09-14 DIAGNOSIS — I509 Heart failure, unspecified: Secondary | ICD-10-CM | POA: Diagnosis not present

## 2018-09-14 DIAGNOSIS — M549 Dorsalgia, unspecified: Secondary | ICD-10-CM | POA: Diagnosis not present

## 2018-09-14 DIAGNOSIS — E1140 Type 2 diabetes mellitus with diabetic neuropathy, unspecified: Secondary | ICD-10-CM | POA: Diagnosis not present

## 2018-09-14 DIAGNOSIS — I110 Hypertensive heart disease with heart failure: Secondary | ICD-10-CM | POA: Diagnosis not present

## 2018-09-14 DIAGNOSIS — J449 Chronic obstructive pulmonary disease, unspecified: Secondary | ICD-10-CM | POA: Diagnosis not present

## 2018-09-14 DIAGNOSIS — G8929 Other chronic pain: Secondary | ICD-10-CM | POA: Diagnosis not present

## 2018-09-15 NOTE — Telephone Encounter (Signed)
Left detailed message for Brooke Baker giving verbals and can call back if needed.

## 2018-09-15 NOTE — Telephone Encounter (Signed)
Joelene Millin calling with [facility] would like verbals for PT 3x2 1x4. Also would like home health aid for 1x4

## 2018-09-19 ENCOUNTER — Telehealth: Payer: Self-pay

## 2018-09-19 DIAGNOSIS — J449 Chronic obstructive pulmonary disease, unspecified: Secondary | ICD-10-CM | POA: Diagnosis not present

## 2018-09-19 DIAGNOSIS — G40909 Epilepsy, unspecified, not intractable, without status epilepticus: Secondary | ICD-10-CM | POA: Diagnosis not present

## 2018-09-19 DIAGNOSIS — I509 Heart failure, unspecified: Secondary | ICD-10-CM | POA: Diagnosis not present

## 2018-09-19 DIAGNOSIS — G8929 Other chronic pain: Secondary | ICD-10-CM | POA: Diagnosis not present

## 2018-09-19 DIAGNOSIS — J4540 Moderate persistent asthma, uncomplicated: Secondary | ICD-10-CM | POA: Diagnosis not present

## 2018-09-19 DIAGNOSIS — I2510 Atherosclerotic heart disease of native coronary artery without angina pectoris: Secondary | ICD-10-CM | POA: Diagnosis not present

## 2018-09-19 DIAGNOSIS — M549 Dorsalgia, unspecified: Secondary | ICD-10-CM | POA: Diagnosis not present

## 2018-09-19 DIAGNOSIS — I110 Hypertensive heart disease with heart failure: Secondary | ICD-10-CM | POA: Diagnosis not present

## 2018-09-19 DIAGNOSIS — E1140 Type 2 diabetes mellitus with diabetic neuropathy, unspecified: Secondary | ICD-10-CM | POA: Diagnosis not present

## 2018-09-19 NOTE — Telephone Encounter (Signed)
Called patients daughter. Confirmed that she is doing ok. Mobility is getting better. Home health is coming out. Advised patients daughter to let us know if anything is needed.

## 2018-09-20 ENCOUNTER — Ambulatory Visit: Payer: Self-pay | Admitting: *Deleted

## 2018-09-20 ENCOUNTER — Telehealth: Payer: Self-pay | Admitting: Internal Medicine

## 2018-09-20 ENCOUNTER — Ambulatory Visit: Payer: Medicare PPO | Admitting: Internal Medicine

## 2018-09-20 NOTE — Telephone Encounter (Signed)
Ok to schedule doxy

## 2018-09-20 NOTE — Telephone Encounter (Signed)
Pt scheduled for tomorrow

## 2018-09-20 NOTE — Telephone Encounter (Addendum)
  Daughter Juanda Bond called in concerned about her mother's cough.   It's clear what she is coughing up but Tammy is wanting to know if Dr. Nicki Reaper can call in something before her cough gets worse.    She has asthma and COPD. She fell so is not able to get around very well to come into the office.  See notes below.  Tammy would like a call back with Dr. Bary Leriche plan.  I sent a high priority note to Dr. Bary Leriche office.  I also called and spoke with Larena Glassman letting her know the situation.  She's going to talk with Dr. Nicki Reaper.  Reason for Disposition . [1] Known COPD or other severe lung disease (i.e., bronchiectasis, cystic fibrosis, lung surgery) AND [2] worsening symptoms (i.e., increased sputum purulence or amount, increased breathing difficulty    Has asthma and COPD  Answer Assessment - Initial Assessment Questions 1. ONSET: "When did the cough begin?"      3-4 days ago 2. SEVERITY: "How bad is the cough today?"      Juanda Bond, daughter called in concerned about her mother's cough and was wondering if Dr. Nicki Reaper would order a steroid.   Pt fell and can't get around very well. 3. RESPIRATORY DISTRESS: "Describe your breathing."      Coughing clear sputum but wanting to get her started on something before it becomes worse with her asthma and COPD. 4. FEVER: "Do you have a fever?" If so, ask: "What is your temperature, how was it measured, and when did it start?"     No fever 5. SPUTUM: "Describe the color of your sputum" (clear, white, yellow, green)     Clear  6. HEMOPTYSIS: "Are you coughing up any blood?" If so ask: "How much?" (flecks, streaks, tablespoons, etc.)     No blood 7. CARDIAC HISTORY: "Do you have any history of heart disease?" (e.g., heart attack, congestive heart failure)      Stents put in 8. LUNG HISTORY: "Do you have any history of lung disease?"  (e.g., pulmonary embolus, asthma, emphysema)     Asthma, CHF 9. PE RISK FACTORS: "Do you have a history of blood  clots?" (or: recent major surgery, recent prolonged travel, bedridden)     No 12. TRAVEL: "Have you traveled out of the country in the last month?" (e.g., travel history, exposures)       No  Protocols used: [place]

## 2018-09-20 NOTE — Telephone Encounter (Signed)
Do you want to add her on at 4:30 as a virtual ?

## 2018-09-20 NOTE — Telephone Encounter (Signed)
Copied from [place] [phone]. Topic: General - Other >> Sep 20, 2018  1:28 PM Celene Kras A wrote: Reason for CRM: Pts daughter called in stating pt has been coughing for 4-5 days and she is productive coughing. Pt is requesting to have prednisone sent in.  [facility] [address] [place], [place] [address] Phone: [phone] Fax: [phone] Not a 24 hour pharmacy; exact hours not known.

## 2018-09-21 ENCOUNTER — Other Ambulatory Visit: Payer: Self-pay

## 2018-09-21 ENCOUNTER — Ambulatory Visit (INDEPENDENT_AMBULATORY_CARE_PROVIDER_SITE_OTHER): Payer: Medicare PPO | Admitting: Internal Medicine

## 2018-09-21 DIAGNOSIS — I509 Heart failure, unspecified: Secondary | ICD-10-CM | POA: Diagnosis not present

## 2018-09-21 DIAGNOSIS — J849 Interstitial pulmonary disease, unspecified: Secondary | ICD-10-CM

## 2018-09-21 DIAGNOSIS — W19XXXD Unspecified fall, subsequent encounter: Secondary | ICD-10-CM

## 2018-09-21 DIAGNOSIS — M549 Dorsalgia, unspecified: Secondary | ICD-10-CM | POA: Diagnosis not present

## 2018-09-21 DIAGNOSIS — G40909 Epilepsy, unspecified, not intractable, without status epilepticus: Secondary | ICD-10-CM | POA: Diagnosis not present

## 2018-09-21 DIAGNOSIS — R05 Cough: Secondary | ICD-10-CM

## 2018-09-21 DIAGNOSIS — I10 Essential (primary) hypertension: Secondary | ICD-10-CM

## 2018-09-21 DIAGNOSIS — I2510 Atherosclerotic heart disease of native coronary artery without angina pectoris: Secondary | ICD-10-CM | POA: Diagnosis not present

## 2018-09-21 DIAGNOSIS — E1140 Type 2 diabetes mellitus with diabetic neuropathy, unspecified: Secondary | ICD-10-CM | POA: Diagnosis not present

## 2018-09-21 DIAGNOSIS — I110 Hypertensive heart disease with heart failure: Secondary | ICD-10-CM | POA: Diagnosis not present

## 2018-09-21 DIAGNOSIS — I4891 Unspecified atrial fibrillation: Secondary | ICD-10-CM | POA: Diagnosis not present

## 2018-09-21 DIAGNOSIS — J4540 Moderate persistent asthma, uncomplicated: Secondary | ICD-10-CM | POA: Diagnosis not present

## 2018-09-21 DIAGNOSIS — J449 Chronic obstructive pulmonary disease, unspecified: Secondary | ICD-10-CM | POA: Diagnosis not present

## 2018-09-21 DIAGNOSIS — G8929 Other chronic pain: Secondary | ICD-10-CM | POA: Diagnosis not present

## 2018-09-21 DIAGNOSIS — R059 Cough, unspecified: Secondary | ICD-10-CM

## 2018-09-21 MED ORDER — PREDNISONE 10 MG PO TABS: ORAL_TABLET | ORAL | 0 refills | Status: DC

## 2018-09-21 NOTE — Progress Notes (Signed)
Patient ID: Brooke Baker, female   DOB: [birth year], 84 y.o.   MRN: [id number]   Virtual Visit via video Note  This visit type was conducted due to national recommendations for restrictions regarding the COVID-19 pandemic (e.g. social distancing).  This format is felt to be most appropriate for this patient at this time.  All issues noted in this document were discussed and addressed.  No physical exam was performed (except for noted visual exam findings with Video Visits).   I connected with Brooke Baker by a video enabled telemedicine application and verified that I am speaking with the correct person using two identifiers. Location patient: home Location provider: work Persons participating in the virtual visit: patient, provider and pts daughter [name].    I discussed the limitations, risks, security and privacy concerns of performing an evaluation and management service by video and the availability of in person appointments.  The patient expressed understanding and agreed to proceed.   Reason for visit: work in appt.   HPI: Had fall 09/08/18.  Evaluated in ER.  Note reviewed.  Discharged home with PT.  Daughter reports that initially pt was not getting out of bed.  Since they have been working with her, she is now able to get up and stand.  She is able to sit on the side of the bed.  She is able to ambulate some with her walker.  Wearing her oxygen.  Needs a portable tank.  She has developed a cough.  No known covid exposures.  Has been followed for years by pulmonary.  Has documented ILD and recurring flares - bronchitis.  Has to take intermittent courses of prednisone. She reports this feels like one of her typical flares.  No fever. Does not feel more sob. Some cough. No chest tightness.  No vomiting.  Eating.  No abdominal pain.     ROS: See pertinent positives and negatives per HPI.  Past Medical History:  Diagnosis Date  . Acute bronchitis   . Allergic rhinitis   . Arthritis    . CAD (coronary artery disease)    s/p stent LAD 8/03 and stent placement OM1  . Cancer ([place])    skin  . Chronic back pain   . COPD with asthma ([place])   . CVA (cerebral vascular accident) ([place])   . Diabetes mellitus ([place])   . Hypercholesterolemia   . Hypertension   . Seizure disorder [facility])     Past Surgical History:  Procedure Laterality Date  . BREAST BIOPSY Bilateral    axillas  . CORONARY STENT PLACEMENT  8/03   LAD  . CORONARY STENT PLACEMENT     OM1  2006  . FEMUR FRACTURE SURGERY Right [id number]   Dr. Marry Guan  . LUMBAR SPINE SURGERY     x3  . TOTAL ABDOMINAL HYSTERECTOMY     appendectomy - age 69  . TOTAL HIP ARTHROPLASTY    . TOTAL KNEE ARTHROPLASTY     2005    Family History  Problem Relation Age of Onset  . Allergies Brother   . Cancer Brother   . Allergies Sister   . Cancer Sister        Non-hodgkins lymphoma  . Asthma Sister   . Rheum arthritis Sister   . Stroke Mother   . Asthma Brother   . Rheum arthritis Brother   . Cancer Sister        non-hodgkins lymphoma  . Diabetes Brother     SOCIAL HX: reviewed.  Current Outpatient Medications:  .  albuterol (PROVENTIL) (2.5 MG/3ML) 0.083% nebulizer solution, USE ONE VIAL VIA NEBULZIER EVERY FOUR HOURS AS NEEDED FOR WHEEZING (Patient taking differently: Take 2.5 mg by nebulization every 4 (four) hours as needed for wheezing or shortness of breath. ), Disp: 180 vial, Rfl: 0 .  albuterol (VENTOLIN HFA) 108 (90 Base) MCG/ACT inhaler, USE 2 PUFFS EVERY 4 HOURS AS DIRECTED - RESCUE (Patient taking differently: Inhale 2 puffs into the lungs every 4 (four) hours as needed for wheezing or shortness of breath. ), Disp: 1 Inhaler, Rfl: 6 .  amLODipine (NORVASC) 10 MG tablet, TAKE 1 TABLET BY MOUTH DAILY (Patient taking differently: Take 10 mg by mouth daily. ), Disp: 30 tablet, Rfl: 2 .  aspirin 81 MG tablet, Take 81 mg by mouth daily.  , Disp: , Rfl:  .  budesonide-formoterol (SYMBICORT) 80-4.5 MCG/ACT inhaler,  INHALE 2 PUFFS TWICE A DAY (Patient taking differently: Inhale 2 puffs into the lungs 2 (two) times daily. ), Disp: 10.2 g, Rfl: 3 .  dabigatran (PRADAXA) 150 MG CAPS, Take 150 mg by mouth every 12 (twelve) hours., Disp: , Rfl:  .  fluticasone (FLONASE) 50 MCG/ACT nasal spray, USE TWO SPRAYS IN EACH NOSTRIL EACH DAY AS DIRECTED BY PHYSICIAN (Patient taking differently: Place 2 sprays into both nostrils daily. ), Disp: 16 g, Rfl: 3 .  furosemide (LASIX) 40 MG tablet, Take 40 mg by mouth daily., Disp: , Rfl:  .  gabapentin (NEURONTIN) 100 MG capsule, Take 1 capsule (100 mg total) by mouth 2 (two) times daily., Disp: 60 capsule, Rfl: 1 .  levETIRAcetam (KEPPRA) 750 MG tablet, Take 1 tablet (750 mg total) by mouth 2 (two) times daily., Disp: 60 tablet, Rfl: 1 .  losartan (COZAAR) 100 MG tablet, TAKE 1 TABLET BY MOUTH DAILY (Patient taking differently: Take 100 mg by mouth daily. ), Disp: 90 tablet, Rfl: 1 .  metoprolol succinate (TOPROL-XL) 50 MG 24 hr tablet, TAKE 1 TABLET BY MOUTH TWICE A DAY WITH OR IMMEDIATELY FOLLOWING A MEAL (Patient taking differently: Take 50 mg by mouth 2 (two) times daily. ), Disp: 60 tablet, Rfl: 3 .  phenytoin (DILANTIN) 100 MG ER capsule, Take 4 capsules at bedtime. (Patient taking differently: Take 400 mg by mouth at bedtime. ), Disp: 120 capsule, Rfl: 1 .  potassium chloride (K-DUR) 10 MEQ tablet, TAKE 1 TABLET BY MOUTH DAILY (Patient taking differently: Take 10 mEq by mouth daily. ), Disp: 90 tablet, Rfl: 1 .  predniSONE (DELTASONE) 10 MG tablet, Take 6 tablets x 1 day and then decrease by 1/2 tablet per day until down to zero mg., Disp: 39 tablet, Rfl: 0 .  rosuvastatin (CRESTOR) 20 MG tablet, TAKE 1 TABLET BY MOUTH ONCE A DAY (Patient taking differently: Take 20 mg by mouth daily. ), Disp: 30 tablet, Rfl: 5 .  sertraline (ZOLOFT) 50 MG tablet, TAKE 1 AND ONE-HALF TABLET BY MOUTH ONCEA DAY (Patient taking differently: Take 75 mg by mouth daily. ), Disp: 45 tablet, Rfl: 5   EXAM:  GENERAL: alert, oriented, appears well and in no acute distress  HEENT: atraumatic, conjunttiva clear, no obvious abnormalities on inspection of external nose and ears  NECK: normal movements of the head and neck  LUNGS: on inspection no signs of respiratory distress, breathing rate appears normal, no obvious gross SOB, gasping or wheezing  CV: no obvious cyanosis  PSYCH/NEURO: pleasant and cooperative, no obvious depression or anxiety, speech and thought processing grossly intact  ASSESSMENT AND  PLAN:  Discussed the following assessment and plan:  Atrial fibrillation Followed by cardiology.  Pt reports stable.  Continue pradaxa.    Hypertension Blood pressure has been under reasonable control.  Follow.    ILD (interstitial lung disease) ([place]) Has been followed by pulmonary. On oxygen.  Needs portable tank.  Will contact [facility].    Cough Cough as outlined.  Feels like her "typical flares".  Continue inhalers. No chest tightness.  Continue oxygen.  Prednisone taper as directed.  Follow.  Call with update.  Hold abx.    Fall Recent fall as outlined.  Leg/to ankle pain.  xrays reviewed.  Discussed f/u with ortho. Wants to hold on f/u. PT in to evaluate.  Able to sit on side of bed and stand.  Has walker.  Follow.      I discussed the assessment and treatment plan with the patient. The patient was provided an opportunity to ask questions and all were answered. The patient agreed with the plan and demonstrated an understanding of the instructions.   The patient was advised to call back or seek an in-person evaluation if the symptoms worsen or if the condition fails to improve as anticipated.   Einar Pheasant, MD

## 2018-09-22 DIAGNOSIS — I110 Hypertensive heart disease with heart failure: Secondary | ICD-10-CM | POA: Diagnosis not present

## 2018-09-22 DIAGNOSIS — J4540 Moderate persistent asthma, uncomplicated: Secondary | ICD-10-CM | POA: Diagnosis not present

## 2018-09-22 DIAGNOSIS — I2510 Atherosclerotic heart disease of native coronary artery without angina pectoris: Secondary | ICD-10-CM | POA: Diagnosis not present

## 2018-09-22 DIAGNOSIS — G8929 Other chronic pain: Secondary | ICD-10-CM | POA: Diagnosis not present

## 2018-09-22 DIAGNOSIS — G40909 Epilepsy, unspecified, not intractable, without status epilepticus: Secondary | ICD-10-CM | POA: Diagnosis not present

## 2018-09-22 DIAGNOSIS — J449 Chronic obstructive pulmonary disease, unspecified: Secondary | ICD-10-CM | POA: Diagnosis not present

## 2018-09-22 DIAGNOSIS — M549 Dorsalgia, unspecified: Secondary | ICD-10-CM | POA: Diagnosis not present

## 2018-09-22 DIAGNOSIS — I509 Heart failure, unspecified: Secondary | ICD-10-CM | POA: Diagnosis not present

## 2018-09-22 DIAGNOSIS — E1140 Type 2 diabetes mellitus with diabetic neuropathy, unspecified: Secondary | ICD-10-CM | POA: Diagnosis not present

## 2018-09-23 DIAGNOSIS — G8929 Other chronic pain: Secondary | ICD-10-CM | POA: Diagnosis not present

## 2018-09-23 DIAGNOSIS — G40909 Epilepsy, unspecified, not intractable, without status epilepticus: Secondary | ICD-10-CM | POA: Diagnosis not present

## 2018-09-23 DIAGNOSIS — I509 Heart failure, unspecified: Secondary | ICD-10-CM | POA: Diagnosis not present

## 2018-09-23 DIAGNOSIS — J4540 Moderate persistent asthma, uncomplicated: Secondary | ICD-10-CM | POA: Diagnosis not present

## 2018-09-23 DIAGNOSIS — M549 Dorsalgia, unspecified: Secondary | ICD-10-CM | POA: Diagnosis not present

## 2018-09-23 DIAGNOSIS — J449 Chronic obstructive pulmonary disease, unspecified: Secondary | ICD-10-CM | POA: Diagnosis not present

## 2018-09-23 DIAGNOSIS — I2510 Atherosclerotic heart disease of native coronary artery without angina pectoris: Secondary | ICD-10-CM | POA: Diagnosis not present

## 2018-09-23 DIAGNOSIS — I110 Hypertensive heart disease with heart failure: Secondary | ICD-10-CM | POA: Diagnosis not present

## 2018-09-23 DIAGNOSIS — E1140 Type 2 diabetes mellitus with diabetic neuropathy, unspecified: Secondary | ICD-10-CM | POA: Diagnosis not present

## 2018-09-25 ENCOUNTER — Encounter: Payer: Self-pay | Admitting: Internal Medicine

## 2018-09-25 DIAGNOSIS — R05 Cough: Secondary | ICD-10-CM | POA: Insufficient documentation

## 2018-09-25 DIAGNOSIS — R059 Cough, unspecified: Secondary | ICD-10-CM | POA: Insufficient documentation

## 2018-09-25 DIAGNOSIS — W19XXXA Unspecified fall, initial encounter: Secondary | ICD-10-CM | POA: Insufficient documentation

## 2018-09-25 NOTE — Assessment & Plan Note (Signed)
Has been followed by pulmonary. On oxygen.  Needs portable tank.  Will contact [facility].

## 2018-09-25 NOTE — Assessment & Plan Note (Signed)
Blood pressure has been under reasonable control.  Follow.   

## 2018-09-25 NOTE — Assessment & Plan Note (Signed)
Cough as outlined.  Feels like her "typical flares".  Continue inhalers. No chest tightness.  Continue oxygen.  Prednisone taper as directed.  Follow.  Call with update.  Hold abx.

## 2018-09-25 NOTE — Assessment & Plan Note (Signed)
Recent fall as outlined.  Leg/to ankle pain.  xrays reviewed.  Discussed f/u with ortho. Wants to hold on f/u. PT in to evaluate.  Able to sit on side of bed and stand.  Has walker.  Follow.

## 2018-09-25 NOTE — Assessment & Plan Note (Signed)
Followed by cardiology.  Pt reports stable.  Continue pradaxa.

## 2018-09-26 ENCOUNTER — Ambulatory Visit: Payer: Medicare PPO | Admitting: Neurology

## 2018-09-26 ENCOUNTER — Telehealth: Payer: Self-pay | Admitting: Neurology

## 2018-09-26 DIAGNOSIS — I509 Heart failure, unspecified: Secondary | ICD-10-CM | POA: Diagnosis not present

## 2018-09-26 DIAGNOSIS — I110 Hypertensive heart disease with heart failure: Secondary | ICD-10-CM | POA: Diagnosis not present

## 2018-09-26 DIAGNOSIS — E1140 Type 2 diabetes mellitus with diabetic neuropathy, unspecified: Secondary | ICD-10-CM | POA: Diagnosis not present

## 2018-09-26 DIAGNOSIS — J449 Chronic obstructive pulmonary disease, unspecified: Secondary | ICD-10-CM | POA: Diagnosis not present

## 2018-09-26 DIAGNOSIS — G40909 Epilepsy, unspecified, not intractable, without status epilepticus: Secondary | ICD-10-CM | POA: Diagnosis not present

## 2018-09-26 DIAGNOSIS — M549 Dorsalgia, unspecified: Secondary | ICD-10-CM | POA: Diagnosis not present

## 2018-09-26 DIAGNOSIS — J4540 Moderate persistent asthma, uncomplicated: Secondary | ICD-10-CM | POA: Diagnosis not present

## 2018-09-26 DIAGNOSIS — I2510 Atherosclerotic heart disease of native coronary artery without angina pectoris: Secondary | ICD-10-CM | POA: Diagnosis not present

## 2018-09-26 DIAGNOSIS — G8929 Other chronic pain: Secondary | ICD-10-CM | POA: Diagnosis not present

## 2018-09-26 NOTE — Telephone Encounter (Signed)
This patient did not show for a revisit appointment today. 

## 2018-09-28 DIAGNOSIS — I509 Heart failure, unspecified: Secondary | ICD-10-CM | POA: Diagnosis not present

## 2018-09-28 DIAGNOSIS — G40909 Epilepsy, unspecified, not intractable, without status epilepticus: Secondary | ICD-10-CM | POA: Diagnosis not present

## 2018-09-28 DIAGNOSIS — J449 Chronic obstructive pulmonary disease, unspecified: Secondary | ICD-10-CM | POA: Diagnosis not present

## 2018-09-28 DIAGNOSIS — G8929 Other chronic pain: Secondary | ICD-10-CM | POA: Diagnosis not present

## 2018-09-28 DIAGNOSIS — J4540 Moderate persistent asthma, uncomplicated: Secondary | ICD-10-CM | POA: Diagnosis not present

## 2018-09-28 DIAGNOSIS — E1140 Type 2 diabetes mellitus with diabetic neuropathy, unspecified: Secondary | ICD-10-CM | POA: Diagnosis not present

## 2018-09-28 DIAGNOSIS — I2510 Atherosclerotic heart disease of native coronary artery without angina pectoris: Secondary | ICD-10-CM | POA: Diagnosis not present

## 2018-09-28 DIAGNOSIS — I110 Hypertensive heart disease with heart failure: Secondary | ICD-10-CM | POA: Diagnosis not present

## 2018-09-28 DIAGNOSIS — M549 Dorsalgia, unspecified: Secondary | ICD-10-CM | POA: Diagnosis not present

## 2018-09-30 DIAGNOSIS — I509 Heart failure, unspecified: Secondary | ICD-10-CM | POA: Diagnosis not present

## 2018-09-30 DIAGNOSIS — I110 Hypertensive heart disease with heart failure: Secondary | ICD-10-CM | POA: Diagnosis not present

## 2018-09-30 DIAGNOSIS — J4540 Moderate persistent asthma, uncomplicated: Secondary | ICD-10-CM | POA: Diagnosis not present

## 2018-09-30 DIAGNOSIS — E1140 Type 2 diabetes mellitus with diabetic neuropathy, unspecified: Secondary | ICD-10-CM | POA: Diagnosis not present

## 2018-09-30 DIAGNOSIS — M549 Dorsalgia, unspecified: Secondary | ICD-10-CM | POA: Diagnosis not present

## 2018-09-30 DIAGNOSIS — I2510 Atherosclerotic heart disease of native coronary artery without angina pectoris: Secondary | ICD-10-CM | POA: Diagnosis not present

## 2018-09-30 DIAGNOSIS — G40909 Epilepsy, unspecified, not intractable, without status epilepticus: Secondary | ICD-10-CM | POA: Diagnosis not present

## 2018-09-30 DIAGNOSIS — J449 Chronic obstructive pulmonary disease, unspecified: Secondary | ICD-10-CM | POA: Diagnosis not present

## 2018-09-30 DIAGNOSIS — G8929 Other chronic pain: Secondary | ICD-10-CM | POA: Diagnosis not present

## 2018-10-03 DIAGNOSIS — J449 Chronic obstructive pulmonary disease, unspecified: Secondary | ICD-10-CM | POA: Diagnosis not present

## 2018-10-05 DIAGNOSIS — G40909 Epilepsy, unspecified, not intractable, without status epilepticus: Secondary | ICD-10-CM | POA: Diagnosis not present

## 2018-10-05 DIAGNOSIS — G8929 Other chronic pain: Secondary | ICD-10-CM | POA: Diagnosis not present

## 2018-10-05 DIAGNOSIS — I2510 Atherosclerotic heart disease of native coronary artery without angina pectoris: Secondary | ICD-10-CM | POA: Diagnosis not present

## 2018-10-05 DIAGNOSIS — E1140 Type 2 diabetes mellitus with diabetic neuropathy, unspecified: Secondary | ICD-10-CM | POA: Diagnosis not present

## 2018-10-05 DIAGNOSIS — I509 Heart failure, unspecified: Secondary | ICD-10-CM | POA: Diagnosis not present

## 2018-10-05 DIAGNOSIS — I110 Hypertensive heart disease with heart failure: Secondary | ICD-10-CM | POA: Diagnosis not present

## 2018-10-05 DIAGNOSIS — J4540 Moderate persistent asthma, uncomplicated: Secondary | ICD-10-CM | POA: Diagnosis not present

## 2018-10-05 DIAGNOSIS — M549 Dorsalgia, unspecified: Secondary | ICD-10-CM | POA: Diagnosis not present

## 2018-10-05 DIAGNOSIS — J449 Chronic obstructive pulmonary disease, unspecified: Secondary | ICD-10-CM | POA: Diagnosis not present

## 2018-10-06 DIAGNOSIS — I2510 Atherosclerotic heart disease of native coronary artery without angina pectoris: Secondary | ICD-10-CM | POA: Diagnosis not present

## 2018-10-06 DIAGNOSIS — J4540 Moderate persistent asthma, uncomplicated: Secondary | ICD-10-CM | POA: Diagnosis not present

## 2018-10-06 DIAGNOSIS — G40909 Epilepsy, unspecified, not intractable, without status epilepticus: Secondary | ICD-10-CM | POA: Diagnosis not present

## 2018-10-06 DIAGNOSIS — J449 Chronic obstructive pulmonary disease, unspecified: Secondary | ICD-10-CM | POA: Diagnosis not present

## 2018-10-06 DIAGNOSIS — E1140 Type 2 diabetes mellitus with diabetic neuropathy, unspecified: Secondary | ICD-10-CM | POA: Diagnosis not present

## 2018-10-06 DIAGNOSIS — G8929 Other chronic pain: Secondary | ICD-10-CM | POA: Diagnosis not present

## 2018-10-06 DIAGNOSIS — I509 Heart failure, unspecified: Secondary | ICD-10-CM | POA: Diagnosis not present

## 2018-10-06 DIAGNOSIS — M549 Dorsalgia, unspecified: Secondary | ICD-10-CM | POA: Diagnosis not present

## 2018-10-06 DIAGNOSIS — I110 Hypertensive heart disease with heart failure: Secondary | ICD-10-CM | POA: Diagnosis not present

## 2018-10-07 DIAGNOSIS — Z9181 History of falling: Secondary | ICD-10-CM

## 2018-10-07 DIAGNOSIS — I110 Hypertensive heart disease with heart failure: Secondary | ICD-10-CM | POA: Diagnosis not present

## 2018-10-07 DIAGNOSIS — I509 Heart failure, unspecified: Secondary | ICD-10-CM | POA: Diagnosis not present

## 2018-10-07 DIAGNOSIS — G8929 Other chronic pain: Secondary | ICD-10-CM | POA: Diagnosis not present

## 2018-10-07 DIAGNOSIS — J449 Chronic obstructive pulmonary disease, unspecified: Secondary | ICD-10-CM | POA: Diagnosis not present

## 2018-10-07 DIAGNOSIS — Z8673 Personal history of transient ischemic attack (TIA), and cerebral infarction without residual deficits: Secondary | ICD-10-CM

## 2018-10-07 DIAGNOSIS — Z85828 Personal history of other malignant neoplasm of skin: Secondary | ICD-10-CM

## 2018-10-07 DIAGNOSIS — G40909 Epilepsy, unspecified, not intractable, without status epilepticus: Secondary | ICD-10-CM | POA: Diagnosis not present

## 2018-10-07 DIAGNOSIS — Z6839 Body mass index (BMI) 39.0-39.9, adult: Secondary | ICD-10-CM

## 2018-10-07 DIAGNOSIS — L4050 Arthropathic psoriasis, unspecified: Secondary | ICD-10-CM

## 2018-10-07 DIAGNOSIS — I2510 Atherosclerotic heart disease of native coronary artery without angina pectoris: Secondary | ICD-10-CM | POA: Diagnosis not present

## 2018-10-07 DIAGNOSIS — J302 Other seasonal allergic rhinitis: Secondary | ICD-10-CM

## 2018-10-07 DIAGNOSIS — E1140 Type 2 diabetes mellitus with diabetic neuropathy, unspecified: Secondary | ICD-10-CM | POA: Diagnosis not present

## 2018-10-07 DIAGNOSIS — J849 Interstitial pulmonary disease, unspecified: Secondary | ICD-10-CM

## 2018-10-07 DIAGNOSIS — M549 Dorsalgia, unspecified: Secondary | ICD-10-CM | POA: Diagnosis not present

## 2018-10-07 DIAGNOSIS — E7800 Pure hypercholesterolemia, unspecified: Secondary | ICD-10-CM

## 2018-10-07 DIAGNOSIS — E669 Obesity, unspecified: Secondary | ICD-10-CM

## 2018-10-07 DIAGNOSIS — I4891 Unspecified atrial fibrillation: Secondary | ICD-10-CM

## 2018-10-07 DIAGNOSIS — J3089 Other allergic rhinitis: Secondary | ICD-10-CM

## 2018-10-07 DIAGNOSIS — J4540 Moderate persistent asthma, uncomplicated: Secondary | ICD-10-CM | POA: Diagnosis not present

## 2018-10-10 DIAGNOSIS — J449 Chronic obstructive pulmonary disease, unspecified: Secondary | ICD-10-CM | POA: Diagnosis not present

## 2018-10-10 DIAGNOSIS — M549 Dorsalgia, unspecified: Secondary | ICD-10-CM | POA: Diagnosis not present

## 2018-10-10 DIAGNOSIS — I509 Heart failure, unspecified: Secondary | ICD-10-CM | POA: Diagnosis not present

## 2018-10-10 DIAGNOSIS — I2510 Atherosclerotic heart disease of native coronary artery without angina pectoris: Secondary | ICD-10-CM | POA: Diagnosis not present

## 2018-10-10 DIAGNOSIS — E1140 Type 2 diabetes mellitus with diabetic neuropathy, unspecified: Secondary | ICD-10-CM | POA: Diagnosis not present

## 2018-10-10 DIAGNOSIS — I110 Hypertensive heart disease with heart failure: Secondary | ICD-10-CM | POA: Diagnosis not present

## 2018-10-10 DIAGNOSIS — J4540 Moderate persistent asthma, uncomplicated: Secondary | ICD-10-CM | POA: Diagnosis not present

## 2018-10-10 DIAGNOSIS — G40909 Epilepsy, unspecified, not intractable, without status epilepticus: Secondary | ICD-10-CM | POA: Diagnosis not present

## 2018-10-10 DIAGNOSIS — G8929 Other chronic pain: Secondary | ICD-10-CM | POA: Diagnosis not present

## 2018-10-12 DIAGNOSIS — G8929 Other chronic pain: Secondary | ICD-10-CM | POA: Diagnosis not present

## 2018-10-12 DIAGNOSIS — J449 Chronic obstructive pulmonary disease, unspecified: Secondary | ICD-10-CM | POA: Diagnosis not present

## 2018-10-12 DIAGNOSIS — I2510 Atherosclerotic heart disease of native coronary artery without angina pectoris: Secondary | ICD-10-CM | POA: Diagnosis not present

## 2018-10-12 DIAGNOSIS — I110 Hypertensive heart disease with heart failure: Secondary | ICD-10-CM | POA: Diagnosis not present

## 2018-10-12 DIAGNOSIS — J4540 Moderate persistent asthma, uncomplicated: Secondary | ICD-10-CM | POA: Diagnosis not present

## 2018-10-12 DIAGNOSIS — M549 Dorsalgia, unspecified: Secondary | ICD-10-CM | POA: Diagnosis not present

## 2018-10-12 DIAGNOSIS — I509 Heart failure, unspecified: Secondary | ICD-10-CM | POA: Diagnosis not present

## 2018-10-12 DIAGNOSIS — E1140 Type 2 diabetes mellitus with diabetic neuropathy, unspecified: Secondary | ICD-10-CM | POA: Diagnosis not present

## 2018-10-12 DIAGNOSIS — G40909 Epilepsy, unspecified, not intractable, without status epilepticus: Secondary | ICD-10-CM | POA: Diagnosis not present

## 2018-10-15 ENCOUNTER — Other Ambulatory Visit: Payer: Self-pay | Admitting: Neurology

## 2018-10-17 DIAGNOSIS — I110 Hypertensive heart disease with heart failure: Secondary | ICD-10-CM | POA: Diagnosis not present

## 2018-10-17 DIAGNOSIS — M549 Dorsalgia, unspecified: Secondary | ICD-10-CM | POA: Diagnosis not present

## 2018-10-17 DIAGNOSIS — G40909 Epilepsy, unspecified, not intractable, without status epilepticus: Secondary | ICD-10-CM | POA: Diagnosis not present

## 2018-10-17 DIAGNOSIS — I509 Heart failure, unspecified: Secondary | ICD-10-CM | POA: Diagnosis not present

## 2018-10-17 DIAGNOSIS — G8929 Other chronic pain: Secondary | ICD-10-CM | POA: Diagnosis not present

## 2018-10-17 DIAGNOSIS — J449 Chronic obstructive pulmonary disease, unspecified: Secondary | ICD-10-CM | POA: Diagnosis not present

## 2018-10-17 DIAGNOSIS — J4540 Moderate persistent asthma, uncomplicated: Secondary | ICD-10-CM | POA: Diagnosis not present

## 2018-10-17 DIAGNOSIS — I2510 Atherosclerotic heart disease of native coronary artery without angina pectoris: Secondary | ICD-10-CM | POA: Diagnosis not present

## 2018-10-17 DIAGNOSIS — E1140 Type 2 diabetes mellitus with diabetic neuropathy, unspecified: Secondary | ICD-10-CM | POA: Diagnosis not present

## 2018-10-19 DIAGNOSIS — M549 Dorsalgia, unspecified: Secondary | ICD-10-CM | POA: Diagnosis not present

## 2018-10-19 DIAGNOSIS — I2510 Atherosclerotic heart disease of native coronary artery without angina pectoris: Secondary | ICD-10-CM | POA: Diagnosis not present

## 2018-10-19 DIAGNOSIS — J449 Chronic obstructive pulmonary disease, unspecified: Secondary | ICD-10-CM | POA: Diagnosis not present

## 2018-10-19 DIAGNOSIS — G8929 Other chronic pain: Secondary | ICD-10-CM | POA: Diagnosis not present

## 2018-10-19 DIAGNOSIS — I110 Hypertensive heart disease with heart failure: Secondary | ICD-10-CM | POA: Diagnosis not present

## 2018-10-19 DIAGNOSIS — I509 Heart failure, unspecified: Secondary | ICD-10-CM | POA: Diagnosis not present

## 2018-10-19 DIAGNOSIS — E1140 Type 2 diabetes mellitus with diabetic neuropathy, unspecified: Secondary | ICD-10-CM | POA: Diagnosis not present

## 2018-10-19 DIAGNOSIS — J4540 Moderate persistent asthma, uncomplicated: Secondary | ICD-10-CM | POA: Diagnosis not present

## 2018-10-19 DIAGNOSIS — G40909 Epilepsy, unspecified, not intractable, without status epilepticus: Secondary | ICD-10-CM | POA: Diagnosis not present

## 2018-10-21 ENCOUNTER — Telehealth: Payer: Self-pay | Admitting: Internal Medicine

## 2018-10-21 NOTE — Telephone Encounter (Signed)
Called and left message for [employer] w/ [facility].  Gave verbal orders to extend PT as requested 1x a week for 2 weeks.

## 2018-10-21 NOTE — Telephone Encounter (Signed)
Kim with [facility] calling to request patients PT be extended for 2 weeks at 1x a week.

## 2018-10-24 DIAGNOSIS — I509 Heart failure, unspecified: Secondary | ICD-10-CM | POA: Diagnosis not present

## 2018-10-24 DIAGNOSIS — G40909 Epilepsy, unspecified, not intractable, without status epilepticus: Secondary | ICD-10-CM | POA: Diagnosis not present

## 2018-10-24 DIAGNOSIS — J3089 Other allergic rhinitis: Secondary | ICD-10-CM

## 2018-10-24 DIAGNOSIS — I2510 Atherosclerotic heart disease of native coronary artery without angina pectoris: Secondary | ICD-10-CM | POA: Diagnosis not present

## 2018-10-24 DIAGNOSIS — G8929 Other chronic pain: Secondary | ICD-10-CM | POA: Diagnosis not present

## 2018-10-24 DIAGNOSIS — J849 Interstitial pulmonary disease, unspecified: Secondary | ICD-10-CM

## 2018-10-24 DIAGNOSIS — E7800 Pure hypercholesterolemia, unspecified: Secondary | ICD-10-CM

## 2018-10-24 DIAGNOSIS — J302 Other seasonal allergic rhinitis: Secondary | ICD-10-CM

## 2018-10-24 DIAGNOSIS — L4050 Arthropathic psoriasis, unspecified: Secondary | ICD-10-CM

## 2018-10-24 DIAGNOSIS — Z85828 Personal history of other malignant neoplasm of skin: Secondary | ICD-10-CM

## 2018-10-24 DIAGNOSIS — Z8673 Personal history of transient ischemic attack (TIA), and cerebral infarction without residual deficits: Secondary | ICD-10-CM

## 2018-10-24 DIAGNOSIS — Z9181 History of falling: Secondary | ICD-10-CM

## 2018-10-24 DIAGNOSIS — M549 Dorsalgia, unspecified: Secondary | ICD-10-CM | POA: Diagnosis not present

## 2018-10-24 DIAGNOSIS — I4891 Unspecified atrial fibrillation: Secondary | ICD-10-CM

## 2018-10-24 DIAGNOSIS — Z6839 Body mass index (BMI) 39.0-39.9, adult: Secondary | ICD-10-CM

## 2018-10-24 DIAGNOSIS — I110 Hypertensive heart disease with heart failure: Secondary | ICD-10-CM | POA: Diagnosis not present

## 2018-10-24 DIAGNOSIS — E1140 Type 2 diabetes mellitus with diabetic neuropathy, unspecified: Secondary | ICD-10-CM | POA: Diagnosis not present

## 2018-10-24 DIAGNOSIS — J4540 Moderate persistent asthma, uncomplicated: Secondary | ICD-10-CM | POA: Diagnosis not present

## 2018-10-24 DIAGNOSIS — J449 Chronic obstructive pulmonary disease, unspecified: Secondary | ICD-10-CM | POA: Diagnosis not present

## 2018-10-24 DIAGNOSIS — E669 Obesity, unspecified: Secondary | ICD-10-CM

## 2018-10-25 DIAGNOSIS — G8929 Other chronic pain: Secondary | ICD-10-CM | POA: Diagnosis not present

## 2018-10-25 DIAGNOSIS — I110 Hypertensive heart disease with heart failure: Secondary | ICD-10-CM | POA: Diagnosis not present

## 2018-10-25 DIAGNOSIS — J4540 Moderate persistent asthma, uncomplicated: Secondary | ICD-10-CM | POA: Diagnosis not present

## 2018-10-25 DIAGNOSIS — M549 Dorsalgia, unspecified: Secondary | ICD-10-CM | POA: Diagnosis not present

## 2018-10-25 DIAGNOSIS — J449 Chronic obstructive pulmonary disease, unspecified: Secondary | ICD-10-CM | POA: Diagnosis not present

## 2018-10-25 DIAGNOSIS — E1140 Type 2 diabetes mellitus with diabetic neuropathy, unspecified: Secondary | ICD-10-CM | POA: Diagnosis not present

## 2018-10-25 DIAGNOSIS — G40909 Epilepsy, unspecified, not intractable, without status epilepticus: Secondary | ICD-10-CM | POA: Diagnosis not present

## 2018-10-25 DIAGNOSIS — I2510 Atherosclerotic heart disease of native coronary artery without angina pectoris: Secondary | ICD-10-CM | POA: Diagnosis not present

## 2018-10-25 DIAGNOSIS — I509 Heart failure, unspecified: Secondary | ICD-10-CM | POA: Diagnosis not present

## 2018-10-26 ENCOUNTER — Other Ambulatory Visit: Payer: Self-pay | Admitting: Internal Medicine

## 2018-10-31 ENCOUNTER — Telehealth: Payer: Self-pay | Admitting: Neurology

## 2018-10-31 DIAGNOSIS — I509 Heart failure, unspecified: Secondary | ICD-10-CM | POA: Diagnosis not present

## 2018-10-31 DIAGNOSIS — I110 Hypertensive heart disease with heart failure: Secondary | ICD-10-CM | POA: Diagnosis not present

## 2018-10-31 DIAGNOSIS — G8929 Other chronic pain: Secondary | ICD-10-CM | POA: Diagnosis not present

## 2018-10-31 DIAGNOSIS — G40909 Epilepsy, unspecified, not intractable, without status epilepticus: Secondary | ICD-10-CM | POA: Diagnosis not present

## 2018-10-31 DIAGNOSIS — J449 Chronic obstructive pulmonary disease, unspecified: Secondary | ICD-10-CM | POA: Diagnosis not present

## 2018-10-31 DIAGNOSIS — I2510 Atherosclerotic heart disease of native coronary artery without angina pectoris: Secondary | ICD-10-CM | POA: Diagnosis not present

## 2018-10-31 DIAGNOSIS — E1140 Type 2 diabetes mellitus with diabetic neuropathy, unspecified: Secondary | ICD-10-CM | POA: Diagnosis not present

## 2018-10-31 DIAGNOSIS — M549 Dorsalgia, unspecified: Secondary | ICD-10-CM | POA: Diagnosis not present

## 2018-10-31 DIAGNOSIS — J4540 Moderate persistent asthma, uncomplicated: Secondary | ICD-10-CM | POA: Diagnosis not present

## 2018-10-31 NOTE — Telephone Encounter (Signed)
Pt called in and stated that she fell and wouldn't be able to come in until December due to an injury to her leg, appt has been scheduled for Dec 2 with Butler Denmark, NP. Pt also states that if anything happens she will call back in but she seems to be doing pretty good

## 2018-10-31 NOTE — Telephone Encounter (Signed)
Noted, thank you

## 2018-11-02 DIAGNOSIS — J449 Chronic obstructive pulmonary disease, unspecified: Secondary | ICD-10-CM | POA: Diagnosis not present

## 2018-11-07 ENCOUNTER — Telehealth: Payer: Self-pay | Admitting: *Deleted

## 2018-11-07 NOTE — Telephone Encounter (Signed)
Patient daughter says they have been using Desitin and Butt Paste but has reddened area to folds of bottom that itches and hurts to sit .  Patient is mostly sitting because she still can't walk good. Patient has had incontinence with the fall she had and was wearing adult briefs, and she feels this may be what cause dher to get raw . Patient also has hit her leg on wheel chair and has wound to right leg scheduled virtual for 2 tomorrow.

## 2018-11-07 NOTE — Telephone Encounter (Signed)
Noted! Thank you

## 2018-11-07 NOTE — Telephone Encounter (Signed)
Copied from [place] [id number]. Topic: General - Other >> Nov 07, 2018  9:56 AM Ivar Drape wrote: Reason for CRM:   Patient's daughter, [name] [phone], would like a return call concerning patient's bottom being raw.  They don't know what it is or what to do about it.

## 2018-11-08 ENCOUNTER — Ambulatory Visit (INDEPENDENT_AMBULATORY_CARE_PROVIDER_SITE_OTHER): Payer: Medicare PPO | Admitting: Internal Medicine

## 2018-11-08 ENCOUNTER — Encounter: Payer: Self-pay | Admitting: Internal Medicine

## 2018-11-08 ENCOUNTER — Other Ambulatory Visit: Payer: Self-pay

## 2018-11-08 DIAGNOSIS — G8929 Other chronic pain: Secondary | ICD-10-CM | POA: Diagnosis not present

## 2018-11-08 DIAGNOSIS — L989 Disorder of the skin and subcutaneous tissue, unspecified: Secondary | ICD-10-CM | POA: Diagnosis not present

## 2018-11-08 DIAGNOSIS — I509 Heart failure, unspecified: Secondary | ICD-10-CM | POA: Diagnosis not present

## 2018-11-08 DIAGNOSIS — J4540 Moderate persistent asthma, uncomplicated: Secondary | ICD-10-CM

## 2018-11-08 DIAGNOSIS — I110 Hypertensive heart disease with heart failure: Secondary | ICD-10-CM | POA: Diagnosis not present

## 2018-11-08 DIAGNOSIS — G40909 Epilepsy, unspecified, not intractable, without status epilepticus: Secondary | ICD-10-CM | POA: Diagnosis not present

## 2018-11-08 DIAGNOSIS — E1140 Type 2 diabetes mellitus with diabetic neuropathy, unspecified: Secondary | ICD-10-CM | POA: Diagnosis not present

## 2018-11-08 DIAGNOSIS — I4891 Unspecified atrial fibrillation: Secondary | ICD-10-CM | POA: Diagnosis not present

## 2018-11-08 DIAGNOSIS — I2510 Atherosclerotic heart disease of native coronary artery without angina pectoris: Secondary | ICD-10-CM | POA: Diagnosis not present

## 2018-11-08 DIAGNOSIS — L4050 Arthropathic psoriasis, unspecified: Secondary | ICD-10-CM

## 2018-11-08 DIAGNOSIS — J449 Chronic obstructive pulmonary disease, unspecified: Secondary | ICD-10-CM | POA: Diagnosis not present

## 2018-11-08 DIAGNOSIS — M549 Dorsalgia, unspecified: Secondary | ICD-10-CM | POA: Diagnosis not present

## 2018-11-08 MED ORDER — MUPIROCIN 2 % EX OINT: TOPICAL_OINTMENT | CUTANEOUS | 0 refills | Status: DC

## 2018-11-08 MED ORDER — NYSTATIN-TRIAMCINOLONE 100000-0.1 UNIT/GM-% EX CREA: 1.0000 "application " | TOPICAL_CREAM | Freq: Two times a day (BID) | CUTANEOUS | 0 refills | Status: DC

## 2018-11-08 NOTE — Progress Notes (Signed)
Patient ID: Brooke Baker, female   DOB: 1934/11/07, 84 y.o.   MRN: [id number]   Virtual Visit via video Note  This visit type was conducted due to national recommendations for restrictions regarding the COVID-19 pandemic (e.g. social distancing).  This format is felt to be most appropriate for this patient at this time.  All issues noted in this document were discussed and addressed.  No physical exam was performed (except for noted visual exam findings with Video Visits).   I connected with Brooke Baker by a video enabled telemedicine application and verified that I am speaking with the correct person using two identifiers. Location patient: home Location provider: work  Persons participating in the virtual visit: patient, provider and pts daughter  I discussed the limitations, risks, security and privacy concerns of performing an evaluation and management service by video and the availability of in person appointments.  The patient expressed understanding and agreed to proceed.   Reason for visit: work in appt  HPI: She has been sitting a lot.  Sleeps in the recliner.  Afraid she will fall.  Working with therapy.  Some lower extremity swelling.  Scraped her leg - right leg.  They have been doing dressing changes.  Does look better.  Having to heal from the inside out.  No surrounding erythema.  Eating.  No nausea or vomiting.  No chest pain.  No increased cough or congestion.  Breathing stable.  Daughter reports - redness - bottom.  They have been applying "butt paste".  Has improved some.  Still some irritation.     ROS: See pertinent positives and negatives per HPI.  Past Medical History:  Diagnosis Date  . Acute bronchitis   . Allergic rhinitis   . Arthritis   . CAD (coronary artery disease)    s/p stent LAD 8/03 and stent placement OM1  . Cancer ([place])    skin  . Chronic back pain   . COPD with asthma ([place])   . CVA (cerebral vascular accident) ([place])   . Diabetes mellitus  ([place])   . Hypercholesterolemia   . Hypertension   . Seizure disorder [facility])     Past Surgical History:  Procedure Laterality Date  . BREAST BIOPSY Bilateral    axillas  . CORONARY STENT PLACEMENT  8/03   LAD  . CORONARY STENT PLACEMENT     OM1  2006  . FEMUR FRACTURE SURGERY Right [id number]   Dr. Marry Guan  . LUMBAR SPINE SURGERY     x3  . TOTAL ABDOMINAL HYSTERECTOMY     appendectomy - age 16  . TOTAL HIP ARTHROPLASTY    . TOTAL KNEE ARTHROPLASTY     2005    Family History  Problem Relation Age of Onset  . Allergies Brother   . Cancer Brother   . Allergies Sister   . Cancer Sister        Non-hodgkins lymphoma  . Asthma Sister   . Rheum arthritis Sister   . Stroke Mother   . Asthma Brother   . Rheum arthritis Brother   . Cancer Sister        non-hodgkins lymphoma  . Diabetes Brother     SOCIAL HX: reviewed.    Current Outpatient Medications:  .  albuterol (PROVENTIL) (2.5 MG/3ML) 0.083% nebulizer solution, USE ONE VIAL VIA NEBULZIER EVERY FOUR HOURS AS NEEDED FOR WHEEZING (Patient taking differently: Take 2.5 mg by nebulization every 4 (four) hours as needed for wheezing or shortness of breath. ),  Disp: 180 vial, Rfl: 0 .  albuterol (VENTOLIN HFA) 108 (90 Base) MCG/ACT inhaler, USE 2 PUFFS EVERY 4 HOURS AS DIRECTED - RESCUE (Patient taking differently: Inhale 2 puffs into the lungs every 4 (four) hours as needed for wheezing or shortness of breath. ), Disp: 1 Inhaler, Rfl: 6 .  amLODipine (NORVASC) 10 MG tablet, TAKE 1 TABLET BY MOUTH DAILY, Disp: 30 tablet, Rfl: 2 .  aspirin 81 MG tablet, Take 81 mg by mouth daily.  , Disp: , Rfl:  .  budesonide-formoterol (SYMBICORT) 80-4.5 MCG/ACT inhaler, INHALE 2 PUFFS TWICE A DAY (Patient taking differently: Inhale 2 puffs into the lungs 2 (two) times daily. ), Disp: 10.2 g, Rfl: 3 .  dabigatran (PRADAXA) 150 MG CAPS, Take 150 mg by mouth every 12 (twelve) hours., Disp: , Rfl:  .  fluticasone (FLONASE) 50 MCG/ACT nasal spray, USE  TWO SPRAYS IN EACH NOSTRIL EACH DAY AS DIRECTED BY PHYSICIAN (Patient taking differently: Place 2 sprays into both nostrils daily. ), Disp: 16 g, Rfl: 3 .  furosemide (LASIX) 40 MG tablet, Take 40 mg by mouth daily., Disp: , Rfl:  .  gabapentin (NEURONTIN) 100 MG capsule, Take 1 capsule (100 mg total) by mouth 2 (two) times daily., Disp: 60 capsule, Rfl: 1 .  levETIRAcetam (KEPPRA) 750 MG tablet, TAKE 1 TABLET BY MOUTH TWICE A DAY, Disp: 60 tablet, Rfl: 0 .  losartan (COZAAR) 100 MG tablet, TAKE 1 TABLET BY MOUTH DAILY (Patient taking differently: Take 100 mg by mouth daily. ), Disp: 90 tablet, Rfl: 1 .  metoprolol succinate (TOPROL-XL) 50 MG 24 hr tablet, TAKE 1 TABLET BY MOUTH TWICE A DAY WITH OR IMMEDIATELY FOLLOWING A MEAL (Patient taking differently: Take 50 mg by mouth 2 (two) times daily. ), Disp: 60 tablet, Rfl: 3 .  mupirocin ointment (BACTROBAN) 2 %, Apply to affected area on leg twice a day, Disp: 22 g, Rfl: 0 .  nystatin-triamcinolone (MYCOLOG II) cream, Apply 1 application topically 2 (two) times daily. Apply twice a day to bottom, Disp: 30 g, Rfl: 0 .  phenytoin (DILANTIN) 100 MG ER capsule, TAKE 4 CAPSULES BY MOUTH EVERY NIGHT AT BEDTIME, Disp: 120 capsule, Rfl: 0 .  potassium chloride (K-DUR) 10 MEQ tablet, TAKE 1 TABLET BY MOUTH DAILY (Patient taking differently: Take 10 mEq by mouth daily. ), Disp: 90 tablet, Rfl: 1 .  predniSONE (DELTASONE) 10 MG tablet, Take 6 tablets x 1 day and then decrease by 1/2 tablet per day until down to zero mg., Disp: 39 tablet, Rfl: 0 .  rosuvastatin (CRESTOR) 20 MG tablet, TAKE 1 TABLET BY MOUTH ONCE A DAY (Patient taking differently: Take 20 mg by mouth daily. ), Disp: 30 tablet, Rfl: 5 .  sertraline (ZOLOFT) 50 MG tablet, TAKE 1 AND ONE-HALF TABLET BY MOUTH ONCEA DAY (Patient taking differently: Take 75 mg by mouth daily. ), Disp: 45 tablet, Rfl: 5  EXAM:  GENERAL: alert, oriented, appears well and in no acute distress  HEENT: atraumatic,  conjunttiva clear, no obvious abnormalities on inspection of external nose and ears  NECK: normal movements of the head and neck  LUNGS: on inspection no signs of respiratory distress, breathing rate appears normal, no obvious gross SOB, gasping or wheezing  CV: no obvious cyanosis SKIN:  Some small circular areas of skin irritation - appear to be healing.  Minimal erythema.  Leg:  Larger oval area over lower leg - healing.  No surrounding erythema.    PSYCH/NEURO: pleasant and cooperative, no  obvious depression or anxiety, speech and thought processing grossly intact  ASSESSMENT AND PLAN:  Discussed the following assessment and plan:  Asthma, moderate persistent Breathing stable.    Atrial fibrillation Followed by cardiology. No increased heart rate or palpitations.    Psoriatic arthritis ([place]) Followed by rheumatology.    Skin lesion Skin lesions and irritation - bottom.  Discussed the need to get up and move around and decrease pressure to the site.  Needs to sleep in her bed.  Few plaque-like lesions.  Will give a trial of mycolog cream.  Will update me with pictures.  Follow closely.    Leg lesion Healing wound on right lower leg as outlined.  Leave open.  bactroban topically.  Follow closely.  Send in updates.      I discussed the assessment and treatment plan with the patient. The patient was provided an opportunity to ask questions and all were answered. The patient agreed with the plan and demonstrated an understanding of the instructions.   The patient was advised to call back or seek an in-person evaluation if the symptoms worsen or if the condition fails to improve as anticipated.   Einar Pheasant, MD

## 2018-11-09 ENCOUNTER — Telehealth: Payer: Self-pay | Admitting: Internal Medicine

## 2018-11-09 NOTE — Telephone Encounter (Signed)
Lm to schedule follow up appt for 02/2019 and labs end of 11/2018

## 2018-11-10 DIAGNOSIS — J4540 Moderate persistent asthma, uncomplicated: Secondary | ICD-10-CM | POA: Diagnosis not present

## 2018-11-10 DIAGNOSIS — G40909 Epilepsy, unspecified, not intractable, without status epilepticus: Secondary | ICD-10-CM | POA: Diagnosis not present

## 2018-11-10 DIAGNOSIS — E1140 Type 2 diabetes mellitus with diabetic neuropathy, unspecified: Secondary | ICD-10-CM | POA: Diagnosis not present

## 2018-11-10 DIAGNOSIS — I2510 Atherosclerotic heart disease of native coronary artery without angina pectoris: Secondary | ICD-10-CM | POA: Diagnosis not present

## 2018-11-10 DIAGNOSIS — I110 Hypertensive heart disease with heart failure: Secondary | ICD-10-CM | POA: Diagnosis not present

## 2018-11-10 DIAGNOSIS — M549 Dorsalgia, unspecified: Secondary | ICD-10-CM | POA: Diagnosis not present

## 2018-11-10 DIAGNOSIS — G8929 Other chronic pain: Secondary | ICD-10-CM | POA: Diagnosis not present

## 2018-11-10 DIAGNOSIS — J449 Chronic obstructive pulmonary disease, unspecified: Secondary | ICD-10-CM | POA: Diagnosis not present

## 2018-11-10 DIAGNOSIS — I509 Heart failure, unspecified: Secondary | ICD-10-CM | POA: Diagnosis not present

## 2018-11-13 ENCOUNTER — Encounter: Payer: Self-pay | Admitting: Internal Medicine

## 2018-11-13 DIAGNOSIS — L989 Disorder of the skin and subcutaneous tissue, unspecified: Secondary | ICD-10-CM | POA: Insufficient documentation

## 2018-11-13 NOTE — Assessment & Plan Note (Signed)
Healing wound on right lower leg as outlined.  Leave open.  bactroban topically.  Follow closely.  Send in updates.

## 2018-11-13 NOTE — Assessment & Plan Note (Signed)
Followed by rheumatology. 

## 2018-11-13 NOTE — Assessment & Plan Note (Signed)
Skin lesions and irritation - bottom.  Discussed the need to get up and move around and decrease pressure to the site.  Needs to sleep in her bed.  Few plaque-like lesions.  Will give a trial of mycolog cream.  Will update me with pictures.  Follow closely.

## 2018-11-13 NOTE — Assessment & Plan Note (Signed)
Breathing stable.

## 2018-11-13 NOTE — Assessment & Plan Note (Signed)
Followed by cardiology. No increased heart rate or palpitations.

## 2018-11-14 ENCOUNTER — Other Ambulatory Visit: Payer: Self-pay | Admitting: Internal Medicine

## 2018-11-16 DIAGNOSIS — M549 Dorsalgia, unspecified: Secondary | ICD-10-CM | POA: Diagnosis not present

## 2018-11-16 DIAGNOSIS — I2510 Atherosclerotic heart disease of native coronary artery without angina pectoris: Secondary | ICD-10-CM | POA: Diagnosis not present

## 2018-11-16 DIAGNOSIS — I110 Hypertensive heart disease with heart failure: Secondary | ICD-10-CM | POA: Diagnosis not present

## 2018-11-16 DIAGNOSIS — J4540 Moderate persistent asthma, uncomplicated: Secondary | ICD-10-CM | POA: Diagnosis not present

## 2018-11-16 DIAGNOSIS — E1140 Type 2 diabetes mellitus with diabetic neuropathy, unspecified: Secondary | ICD-10-CM | POA: Diagnosis not present

## 2018-11-16 DIAGNOSIS — G40909 Epilepsy, unspecified, not intractable, without status epilepticus: Secondary | ICD-10-CM | POA: Diagnosis not present

## 2018-11-16 DIAGNOSIS — I509 Heart failure, unspecified: Secondary | ICD-10-CM | POA: Diagnosis not present

## 2018-11-16 DIAGNOSIS — J449 Chronic obstructive pulmonary disease, unspecified: Secondary | ICD-10-CM | POA: Diagnosis not present

## 2018-11-16 DIAGNOSIS — G8929 Other chronic pain: Secondary | ICD-10-CM | POA: Diagnosis not present

## 2018-11-17 DIAGNOSIS — J4540 Moderate persistent asthma, uncomplicated: Secondary | ICD-10-CM | POA: Diagnosis not present

## 2018-11-17 DIAGNOSIS — M549 Dorsalgia, unspecified: Secondary | ICD-10-CM | POA: Diagnosis not present

## 2018-11-17 DIAGNOSIS — I110 Hypertensive heart disease with heart failure: Secondary | ICD-10-CM | POA: Diagnosis not present

## 2018-11-17 DIAGNOSIS — I509 Heart failure, unspecified: Secondary | ICD-10-CM | POA: Diagnosis not present

## 2018-11-17 DIAGNOSIS — J449 Chronic obstructive pulmonary disease, unspecified: Secondary | ICD-10-CM | POA: Diagnosis not present

## 2018-11-17 DIAGNOSIS — E1140 Type 2 diabetes mellitus with diabetic neuropathy, unspecified: Secondary | ICD-10-CM | POA: Diagnosis not present

## 2018-11-17 DIAGNOSIS — I2510 Atherosclerotic heart disease of native coronary artery without angina pectoris: Secondary | ICD-10-CM | POA: Diagnosis not present

## 2018-11-17 DIAGNOSIS — G8929 Other chronic pain: Secondary | ICD-10-CM | POA: Diagnosis not present

## 2018-11-17 DIAGNOSIS — G40909 Epilepsy, unspecified, not intractable, without status epilepticus: Secondary | ICD-10-CM | POA: Diagnosis not present

## 2018-11-18 ENCOUNTER — Other Ambulatory Visit: Payer: Self-pay | Admitting: Internal Medicine

## 2018-11-18 ENCOUNTER — Other Ambulatory Visit: Payer: Self-pay | Admitting: Neurology

## 2018-11-21 ENCOUNTER — Other Ambulatory Visit: Payer: Self-pay | Admitting: Internal Medicine

## 2018-11-22 DIAGNOSIS — I110 Hypertensive heart disease with heart failure: Secondary | ICD-10-CM | POA: Diagnosis not present

## 2018-11-22 DIAGNOSIS — G40909 Epilepsy, unspecified, not intractable, without status epilepticus: Secondary | ICD-10-CM | POA: Diagnosis not present

## 2018-11-22 DIAGNOSIS — J449 Chronic obstructive pulmonary disease, unspecified: Secondary | ICD-10-CM | POA: Diagnosis not present

## 2018-11-22 DIAGNOSIS — I2510 Atherosclerotic heart disease of native coronary artery without angina pectoris: Secondary | ICD-10-CM | POA: Diagnosis not present

## 2018-11-22 DIAGNOSIS — M549 Dorsalgia, unspecified: Secondary | ICD-10-CM | POA: Diagnosis not present

## 2018-11-22 DIAGNOSIS — I509 Heart failure, unspecified: Secondary | ICD-10-CM | POA: Diagnosis not present

## 2018-11-22 DIAGNOSIS — E1140 Type 2 diabetes mellitus with diabetic neuropathy, unspecified: Secondary | ICD-10-CM | POA: Diagnosis not present

## 2018-11-22 DIAGNOSIS — G8929 Other chronic pain: Secondary | ICD-10-CM | POA: Diagnosis not present

## 2018-11-22 DIAGNOSIS — J4540 Moderate persistent asthma, uncomplicated: Secondary | ICD-10-CM | POA: Diagnosis not present

## 2018-11-22 NOTE — Telephone Encounter (Signed)
LMTCB. Need to know if she still needs.

## 2018-11-22 NOTE — Telephone Encounter (Signed)
LMTCB. Need to know if she is still using this

## 2018-11-23 DIAGNOSIS — J449 Chronic obstructive pulmonary disease, unspecified: Secondary | ICD-10-CM | POA: Diagnosis not present

## 2018-11-23 DIAGNOSIS — I110 Hypertensive heart disease with heart failure: Secondary | ICD-10-CM | POA: Diagnosis not present

## 2018-11-23 DIAGNOSIS — I509 Heart failure, unspecified: Secondary | ICD-10-CM | POA: Diagnosis not present

## 2018-11-23 DIAGNOSIS — I2510 Atherosclerotic heart disease of native coronary artery without angina pectoris: Secondary | ICD-10-CM | POA: Diagnosis not present

## 2018-11-23 DIAGNOSIS — J4540 Moderate persistent asthma, uncomplicated: Secondary | ICD-10-CM | POA: Diagnosis not present

## 2018-11-23 DIAGNOSIS — M549 Dorsalgia, unspecified: Secondary | ICD-10-CM | POA: Diagnosis not present

## 2018-11-23 DIAGNOSIS — E1140 Type 2 diabetes mellitus with diabetic neuropathy, unspecified: Secondary | ICD-10-CM | POA: Diagnosis not present

## 2018-11-23 DIAGNOSIS — G8929 Other chronic pain: Secondary | ICD-10-CM | POA: Diagnosis not present

## 2018-11-23 DIAGNOSIS — G40909 Epilepsy, unspecified, not intractable, without status epilepticus: Secondary | ICD-10-CM | POA: Diagnosis not present

## 2018-11-23 NOTE — Telephone Encounter (Signed)
Pt called in and stated she does still need this  med, she has place on her back side that she needs to use this ointment for.  She stated is just as little rash that has not completely healed yet    [place] number for pt [phone]

## 2018-11-29 DIAGNOSIS — I110 Hypertensive heart disease with heart failure: Secondary | ICD-10-CM | POA: Diagnosis not present

## 2018-11-29 DIAGNOSIS — E1140 Type 2 diabetes mellitus with diabetic neuropathy, unspecified: Secondary | ICD-10-CM | POA: Diagnosis not present

## 2018-11-29 DIAGNOSIS — I509 Heart failure, unspecified: Secondary | ICD-10-CM | POA: Diagnosis not present

## 2018-11-29 DIAGNOSIS — M549 Dorsalgia, unspecified: Secondary | ICD-10-CM | POA: Diagnosis not present

## 2018-11-29 DIAGNOSIS — J449 Chronic obstructive pulmonary disease, unspecified: Secondary | ICD-10-CM | POA: Diagnosis not present

## 2018-11-29 DIAGNOSIS — J4540 Moderate persistent asthma, uncomplicated: Secondary | ICD-10-CM | POA: Diagnosis not present

## 2018-11-29 DIAGNOSIS — G8929 Other chronic pain: Secondary | ICD-10-CM | POA: Diagnosis not present

## 2018-11-29 DIAGNOSIS — I2510 Atherosclerotic heart disease of native coronary artery without angina pectoris: Secondary | ICD-10-CM | POA: Diagnosis not present

## 2018-11-29 DIAGNOSIS — G40909 Epilepsy, unspecified, not intractable, without status epilepticus: Secondary | ICD-10-CM | POA: Diagnosis not present

## 2018-12-02 ENCOUNTER — Other Ambulatory Visit: Payer: Self-pay | Admitting: Internal Medicine

## 2018-12-02 ENCOUNTER — Other Ambulatory Visit: Payer: Self-pay | Admitting: Neurology

## 2018-12-03 DIAGNOSIS — J449 Chronic obstructive pulmonary disease, unspecified: Secondary | ICD-10-CM | POA: Diagnosis not present

## 2018-12-05 ENCOUNTER — Other Ambulatory Visit: Payer: Self-pay | Admitting: Internal Medicine

## 2018-12-05 ENCOUNTER — Telehealth: Payer: Self-pay | Admitting: Neurology

## 2018-12-05 MED ORDER — LEVETIRACETAM 750 MG PO TABS: 750.0000 mg | ORAL_TABLET | Freq: Two times a day (BID) | ORAL | 1 refills | Status: DC

## 2018-12-05 NOTE — Telephone Encounter (Signed)
Medication Refill - Medication:   albuterol (PROVENTIL) (2.5 MG/3ML) 0.083% nebulizer solution    albuterol (VENTOLIN HFA) 108 (90 Base) MCG/ACT inhaler   potassium chloride (KLOR-CON) 10 MEQ tablet   Has the patient contacted their pharmacy? Yes.   (Agent: If no, request that the patient contact the pharmacy for the refill.) (Agent: If yes, when and what did the pharmacy advise?)  Preferred Pharmacy (with phone number or street name):  [place], [place] [phone] (Phone) [phone] (Fax)   PATIENT IN COMPLETELY OUT OF THIS MEDICATION  Agent: Please be advised that RX refills may take up to 3 business days. We ask that you follow-up with your pharmacy.

## 2018-12-05 NOTE — Telephone Encounter (Signed)
I receive a message to renew her AED refills, I renewed Keppra Rx, Dilantin Rx was renewed today by Dr. Leonie Man earlier.

## 2018-12-06 NOTE — Telephone Encounter (Signed)
Requested medication (s) are due for refill today: yes  Requested medication (s) are on the active medication list: yes  Last refill:  01/28/2016  Future visit scheduled: yes  Notes to clinic:  Review for refill Last filled by different provider    Requested Prescriptions  Pending Prescriptions Disp Refills   albuterol (PROVENTIL) (2.5 MG/3ML) 0.083% nebulizer solution      Sig: USE ONE VIAL VIA [place] AS NEEDED FOR WHEEZING     Pulmonology:  Beta Agonists Failed - 12/05/2018  3:23 PM      Failed - One inhaler should last at least one month. If the patient is requesting refills earlier, contact the patient to check for uncontrolled symptoms.      Passed - Valid encounter within last 12 months    Recent Outpatient Visits          4 weeks ago Moderate persistent asthma without complication   [place] [name], [place], MD   2 months ago Atrial fibrillation, unspecified type [facility])   [facility] [place], [place], MD   3 months ago Moderate persistent asthma without complication   [place] [name], [name], MD   6 months ago Breast cancer screening   [place] [name], [name], MD   1 year ago SOB (shortness of breath)   [place], MD      Future Appointments            In 5 months O'Brien-Blaney, Denisa L, LPN [facility] [place], [facility]            albuterol (VENTOLIN HFA) 108 (90 Base) MCG/ACT inhaler      Sig: USE 2 PUFFS EVERY 4 HOURS AS DIRECTED - RESCUE     Pulmonology:  Beta Agonists Failed - 12/05/2018  3:23 PM      Failed - One inhaler should last at least one month. If the patient is requesting refills earlier, contact the patient to check for uncontrolled symptoms.      Passed - Valid encounter within last 12 months    Recent Outpatient Visits          4 weeks ago Moderate persistent asthma without complication    [place] [name], [place], MD   2 months ago Atrial fibrillation, unspecified type [facility])   [facility] [place], [place], MD   3 months ago Moderate persistent asthma without complication   [facility] [facility] [place], [name], MD   6 months ago Breast cancer screening   [place] [name], [name], MD   1 year ago SOB (shortness of breath)   [place], MD      Future Appointments            In 5 months O'Brien-Blaney, Denisa L, LPN  [facility] [place], [facility]            potassium chloride (KLOR-CON) 10 MEQ tablet 90 tablet 1    Sig: Take 1 tablet (10 mEq total) by mouth daily.     Endocrinology:  Minerals - Potassium Supplementation Failed - 12/05/2018  3:23 PM      Failed - Cr in normal range and within 360 days    Creat  Date Value Ref Range Status  05/24/2017 1.15 (H) 0.60 - 0.88 mg/dL Final    Comment:    For patients >83 years of age, the reference limit for  Creatinine is approximately 13% higher for people identified as African-American. .    Creatinine, Ser  Date Value Ref Range Status  09/08/2018 1.15 (H) 0.44 - 1.00 mg/dL Final         Passed - K in normal range and within 360 days    Potassium  Date Value Ref Range Status  09/08/2018 4.5 3.5 - 5.1 mmol/L Final  05/17/2012 3.4 (L) 3.5 - 5.1 mmol/L Final         Passed - Valid encounter within last 12 months    Recent Outpatient Visits          4 weeks ago Moderate persistent asthma without complication   [facility] [facility] [place], [name], MD   2 months ago Atrial fibrillation, unspecified type [facility])   [place] [facility] [place], [place], MD   3 months ago Moderate persistent asthma without complication   [facility] [place], [name], MD   6 months ago Breast cancer screening   [place] [name], [name], MD   1 year  ago SOB (shortness of breath)   [place], MD      Future Appointments            In 5 months O'Brien-Blaney, Bryson [place], LPN [place], [facility]

## 2018-12-07 ENCOUNTER — Ambulatory Visit: Payer: Medicare PPO | Admitting: Neurology

## 2018-12-07 DIAGNOSIS — J449 Chronic obstructive pulmonary disease, unspecified: Secondary | ICD-10-CM | POA: Diagnosis not present

## 2018-12-07 DIAGNOSIS — I110 Hypertensive heart disease with heart failure: Secondary | ICD-10-CM | POA: Diagnosis not present

## 2018-12-07 DIAGNOSIS — J4540 Moderate persistent asthma, uncomplicated: Secondary | ICD-10-CM | POA: Diagnosis not present

## 2018-12-07 DIAGNOSIS — I509 Heart failure, unspecified: Secondary | ICD-10-CM | POA: Diagnosis not present

## 2018-12-07 DIAGNOSIS — I2510 Atherosclerotic heart disease of native coronary artery without angina pectoris: Secondary | ICD-10-CM | POA: Diagnosis not present

## 2018-12-07 DIAGNOSIS — G40909 Epilepsy, unspecified, not intractable, without status epilepticus: Secondary | ICD-10-CM | POA: Diagnosis not present

## 2018-12-07 DIAGNOSIS — G8929 Other chronic pain: Secondary | ICD-10-CM | POA: Diagnosis not present

## 2018-12-07 DIAGNOSIS — M549 Dorsalgia, unspecified: Secondary | ICD-10-CM | POA: Diagnosis not present

## 2018-12-07 DIAGNOSIS — E1140 Type 2 diabetes mellitus with diabetic neuropathy, unspecified: Secondary | ICD-10-CM | POA: Diagnosis not present

## 2018-12-08 ENCOUNTER — Other Ambulatory Visit: Payer: Self-pay | Admitting: Internal Medicine

## 2018-12-08 DIAGNOSIS — I110 Hypertensive heart disease with heart failure: Secondary | ICD-10-CM | POA: Diagnosis not present

## 2018-12-08 DIAGNOSIS — G8929 Other chronic pain: Secondary | ICD-10-CM | POA: Diagnosis not present

## 2018-12-08 DIAGNOSIS — M549 Dorsalgia, unspecified: Secondary | ICD-10-CM | POA: Diagnosis not present

## 2018-12-08 DIAGNOSIS — I2510 Atherosclerotic heart disease of native coronary artery without angina pectoris: Secondary | ICD-10-CM | POA: Diagnosis not present

## 2018-12-08 DIAGNOSIS — G40909 Epilepsy, unspecified, not intractable, without status epilepticus: Secondary | ICD-10-CM | POA: Diagnosis not present

## 2018-12-08 DIAGNOSIS — I509 Heart failure, unspecified: Secondary | ICD-10-CM | POA: Diagnosis not present

## 2018-12-08 DIAGNOSIS — E1140 Type 2 diabetes mellitus with diabetic neuropathy, unspecified: Secondary | ICD-10-CM | POA: Diagnosis not present

## 2018-12-08 DIAGNOSIS — J4540 Moderate persistent asthma, uncomplicated: Secondary | ICD-10-CM | POA: Diagnosis not present

## 2018-12-08 DIAGNOSIS — J449 Chronic obstructive pulmonary disease, unspecified: Secondary | ICD-10-CM | POA: Diagnosis not present

## 2018-12-08 MED ORDER — ALBUTEROL SULFATE HFA 108 (90 BASE) MCG/ACT IN AERS: INHALATION_SPRAY | RESPIRATORY_TRACT | 1 refills | Status: AC

## 2018-12-08 MED ORDER — ALBUTEROL SULFATE (2.5 MG/3ML) 0.083% IN NEBU: INHALATION_SOLUTION | RESPIRATORY_TRACT | 1 refills | Status: AC

## 2018-12-08 MED ORDER — POTASSIUM CHLORIDE ER 10 MEQ PO TBCR: 10.0000 meq | EXTENDED_RELEASE_TABLET | Freq: Every day | ORAL | 1 refills | Status: DC

## 2018-12-12 ENCOUNTER — Telehealth: Payer: Self-pay

## 2018-12-12 NOTE — Telephone Encounter (Signed)
Copied from [place] [id number]. Topic: General - Other >> Dec 12, 2018  1:31 PM Burchel, Abbi R wrote: Reason for CRM: Pt's daughter [name]) would like a call back re: portable O2 for pt. Please call to advise.   [phone]

## 2018-12-13 DIAGNOSIS — I2510 Atherosclerotic heart disease of native coronary artery without angina pectoris: Secondary | ICD-10-CM | POA: Diagnosis not present

## 2018-12-13 DIAGNOSIS — G40909 Epilepsy, unspecified, not intractable, without status epilepticus: Secondary | ICD-10-CM | POA: Diagnosis not present

## 2018-12-13 DIAGNOSIS — M549 Dorsalgia, unspecified: Secondary | ICD-10-CM | POA: Diagnosis not present

## 2018-12-13 DIAGNOSIS — I110 Hypertensive heart disease with heart failure: Secondary | ICD-10-CM | POA: Diagnosis not present

## 2018-12-13 DIAGNOSIS — J4540 Moderate persistent asthma, uncomplicated: Secondary | ICD-10-CM | POA: Diagnosis not present

## 2018-12-13 DIAGNOSIS — I509 Heart failure, unspecified: Secondary | ICD-10-CM | POA: Diagnosis not present

## 2018-12-13 DIAGNOSIS — E1140 Type 2 diabetes mellitus with diabetic neuropathy, unspecified: Secondary | ICD-10-CM | POA: Diagnosis not present

## 2018-12-13 DIAGNOSIS — J449 Chronic obstructive pulmonary disease, unspecified: Secondary | ICD-10-CM | POA: Diagnosis not present

## 2018-12-13 DIAGNOSIS — G8929 Other chronic pain: Secondary | ICD-10-CM | POA: Diagnosis not present

## 2018-12-14 ENCOUNTER — Telehealth: Payer: Self-pay | Admitting: *Deleted

## 2018-12-14 NOTE — Telephone Encounter (Signed)
Called and spoke with daughter. Confirmed pt doing okay since fall. She bumped her head on the floor when she fell but no acute injuries noted. No mental status changes, lacerations, etc. She is a little sore but no localized pain anywhere specific. Fall was on 12/4. This is just an FYI for you,

## 2018-12-14 NOTE — Telephone Encounter (Signed)
Noted.  Is she still getting therapy.  Do they feel she needs therapy - given recent fall, etc.  Keep [place] posted on how doing since fall and if any problems.

## 2018-12-14 NOTE — Telephone Encounter (Signed)
Copied from [place] [id number]. Topic: General - Other >> Dec 14, 2018 11:58 AM Rainey [place] A wrote: Brooke Baker  home health nurse called to inform Dr.[name] that patient fell  in her bathroom on 12/4, patient is sore but no injuries. Best contact [phone]

## 2018-12-15 NOTE — Telephone Encounter (Signed)
Contacted [employer] about portable oxygen tanks. [name] advised that he would look into it. Faxed over information for him to review.

## 2018-12-19 DIAGNOSIS — E785 Hyperlipidemia, unspecified: Secondary | ICD-10-CM | POA: Diagnosis not present

## 2018-12-19 DIAGNOSIS — R0602 Shortness of breath: Secondary | ICD-10-CM | POA: Diagnosis not present

## 2018-12-19 DIAGNOSIS — I10 Essential (primary) hypertension: Secondary | ICD-10-CM | POA: Diagnosis not present

## 2018-12-19 DIAGNOSIS — I639 Cerebral infarction, unspecified: Secondary | ICD-10-CM | POA: Diagnosis not present

## 2018-12-19 DIAGNOSIS — R609 Edema, unspecified: Secondary | ICD-10-CM | POA: Diagnosis not present

## 2018-12-19 DIAGNOSIS — I2510 Atherosclerotic heart disease of native coronary artery without angina pectoris: Secondary | ICD-10-CM | POA: Diagnosis not present

## 2018-12-19 DIAGNOSIS — Z9889 Other specified postprocedural states: Secondary | ICD-10-CM | POA: Diagnosis not present

## 2018-12-19 DIAGNOSIS — I4819 Other persistent atrial fibrillation: Secondary | ICD-10-CM | POA: Diagnosis not present

## 2018-12-19 DIAGNOSIS — J449 Chronic obstructive pulmonary disease, unspecified: Secondary | ICD-10-CM | POA: Diagnosis not present

## 2018-12-19 NOTE — Telephone Encounter (Signed)
Pt husband came in check on the status of the portable oxygen Advised him of the note below

## 2018-12-20 DIAGNOSIS — J4540 Moderate persistent asthma, uncomplicated: Secondary | ICD-10-CM | POA: Diagnosis not present

## 2018-12-20 DIAGNOSIS — G40909 Epilepsy, unspecified, not intractable, without status epilepticus: Secondary | ICD-10-CM | POA: Diagnosis not present

## 2018-12-20 DIAGNOSIS — M549 Dorsalgia, unspecified: Secondary | ICD-10-CM | POA: Diagnosis not present

## 2018-12-20 DIAGNOSIS — I110 Hypertensive heart disease with heart failure: Secondary | ICD-10-CM | POA: Diagnosis not present

## 2018-12-20 DIAGNOSIS — I509 Heart failure, unspecified: Secondary | ICD-10-CM | POA: Diagnosis not present

## 2018-12-20 DIAGNOSIS — E1140 Type 2 diabetes mellitus with diabetic neuropathy, unspecified: Secondary | ICD-10-CM | POA: Diagnosis not present

## 2018-12-20 DIAGNOSIS — J449 Chronic obstructive pulmonary disease, unspecified: Secondary | ICD-10-CM | POA: Diagnosis not present

## 2018-12-20 DIAGNOSIS — G8929 Other chronic pain: Secondary | ICD-10-CM | POA: Diagnosis not present

## 2018-12-20 DIAGNOSIS — I2510 Atherosclerotic heart disease of native coronary artery without angina pectoris: Secondary | ICD-10-CM | POA: Diagnosis not present

## 2018-12-21 DIAGNOSIS — M549 Dorsalgia, unspecified: Secondary | ICD-10-CM | POA: Diagnosis not present

## 2018-12-21 DIAGNOSIS — J449 Chronic obstructive pulmonary disease, unspecified: Secondary | ICD-10-CM | POA: Diagnosis not present

## 2018-12-21 DIAGNOSIS — I509 Heart failure, unspecified: Secondary | ICD-10-CM | POA: Diagnosis not present

## 2018-12-21 DIAGNOSIS — I2510 Atherosclerotic heart disease of native coronary artery without angina pectoris: Secondary | ICD-10-CM | POA: Diagnosis not present

## 2018-12-21 DIAGNOSIS — G8929 Other chronic pain: Secondary | ICD-10-CM | POA: Diagnosis not present

## 2018-12-21 DIAGNOSIS — I110 Hypertensive heart disease with heart failure: Secondary | ICD-10-CM | POA: Diagnosis not present

## 2018-12-21 DIAGNOSIS — J4540 Moderate persistent asthma, uncomplicated: Secondary | ICD-10-CM | POA: Diagnosis not present

## 2018-12-21 DIAGNOSIS — G40909 Epilepsy, unspecified, not intractable, without status epilepticus: Secondary | ICD-10-CM | POA: Diagnosis not present

## 2018-12-21 DIAGNOSIS — E1140 Type 2 diabetes mellitus with diabetic neuropathy, unspecified: Secondary | ICD-10-CM | POA: Diagnosis not present

## 2018-12-27 DIAGNOSIS — I2510 Atherosclerotic heart disease of native coronary artery without angina pectoris: Secondary | ICD-10-CM | POA: Diagnosis not present

## 2018-12-27 DIAGNOSIS — E1140 Type 2 diabetes mellitus with diabetic neuropathy, unspecified: Secondary | ICD-10-CM | POA: Diagnosis not present

## 2018-12-27 DIAGNOSIS — J449 Chronic obstructive pulmonary disease, unspecified: Secondary | ICD-10-CM | POA: Diagnosis not present

## 2018-12-27 DIAGNOSIS — G8929 Other chronic pain: Secondary | ICD-10-CM | POA: Diagnosis not present

## 2018-12-27 DIAGNOSIS — M549 Dorsalgia, unspecified: Secondary | ICD-10-CM | POA: Diagnosis not present

## 2018-12-27 DIAGNOSIS — J4540 Moderate persistent asthma, uncomplicated: Secondary | ICD-10-CM | POA: Diagnosis not present

## 2018-12-27 DIAGNOSIS — I110 Hypertensive heart disease with heart failure: Secondary | ICD-10-CM | POA: Diagnosis not present

## 2018-12-27 DIAGNOSIS — G40909 Epilepsy, unspecified, not intractable, without status epilepticus: Secondary | ICD-10-CM | POA: Diagnosis not present

## 2018-12-27 DIAGNOSIS — I509 Heart failure, unspecified: Secondary | ICD-10-CM | POA: Diagnosis not present

## 2018-12-28 NOTE — Telephone Encounter (Signed)
Patient is still getting therapy

## 2018-12-29 ENCOUNTER — Ambulatory Visit: Payer: Medicare PPO | Admitting: Internal Medicine

## 2018-12-29 ENCOUNTER — Encounter: Payer: Self-pay | Admitting: Internal Medicine

## 2018-12-29 ENCOUNTER — Other Ambulatory Visit: Payer: Self-pay

## 2018-12-29 VITALS — BP 108/62 | HR 79 | Temp 97.5°F | Ht 66.0 in

## 2018-12-29 DIAGNOSIS — J849 Interstitial pulmonary disease, unspecified: Secondary | ICD-10-CM

## 2018-12-29 DIAGNOSIS — J4540 Moderate persistent asthma, uncomplicated: Secondary | ICD-10-CM

## 2018-12-29 DIAGNOSIS — J9611 Chronic respiratory failure with hypoxia: Secondary | ICD-10-CM

## 2018-12-29 NOTE — Patient Instructions (Addendum)
You can call us when you need your breathing medicine refilled  Order- reschedule CT chest High Resolution, no contrast     Dx ILD protocol  Order- schedule PFT    Dx ILD  Order- lab- CBC w diff, Sed rate, ANA, Rheumatoid Factor   Dx ILD  Please call if we can help

## 2018-12-29 NOTE — Progress Notes (Signed)
Patient ID: Brooke Baker, female    DOB: 27-Jun-1934, 84 y.o.   MRN: [id number]  HPI female never smoker followed for Asthma, allergic rhinitis, complicated by history DM, CAD, CVA/A. Fib/ anticoagulation, Seizure disorder Office Spirometry 05/07/16-moderately severe restriction of exhaled volume, moderate obstructive airways disease. FVC 1.59/55%, FEV1 1.14/52%, ratio 0.71, FEF 25-75% 0.75/47%  ------------------------------------------------------------------------------------  01/13/2018- 84 year old female never smoker followed for Asthma, allergic rhinitis, complicated by history DM, CAD, CVA/ A.Fib/ anticoagulation, Seizure disorder Ventolin HFA, Spiriva Respimat, Symbicort, nebulizer albuterol, tramadol O2 2 L/ [employer] started about 2 months ago Seen here by Dr. Lake Bells 04/20/2017 with CXR identification of ILD.  CXR repeated. -----Asthma;Pt states she is unable to get rid of cough and SOb since Christmas. Pt continues to wear O2. Did not come to office with O2 on today.  She and her husband were acutely ill with chills, increased cough at Christmas.  He got well.  She still has deep cough productive of white sputum.  No fever. We discussed previous imaging indicating interstitial changes needing follow-up. Treated for psoriasis by dermatologist.  Has osteoarthritis but not aware of any connective tissue disease. CXR 04/20/2017- Advanced interstitial lung disease. New patchy airspace disease right upper lobe, possible pneumonia.  12/29/2018- 84 year old female never smoker followed for Asthma, ILD, allergic rhinitis, complicated by history DM, CAD, CVA/ A.Fib/ anticoagulation, Seizure disorder Ventolin HFA, Spiriva Respimat, Symbicort, nebulizer albuterol, tramadol O2 4 L/ [employer]  HRCT and PFT  scheduled LOV- never done. CAD followed at [facility] Albuterol hfa, neb albuterol, Symbicort 80,  -----f/u ILD. Patient stated whan she is up walking she get's SOB. o2 4l PRN during day  and bedtime.  Used rescue inhaler this morning and most days- helps some. No recent use of nebulizer. Continues Symbicort.  Balance poor- walking less.  Review of Systems-see HPI   + = positive Constitutional:   No-   weight loss, night sweats, fevers, chills, + fatigue, lassitude. HEENT:   No-  headaches, difficulty swallowing, tooth/dental problems, sore throat,       No-  sneezing, itching, ear ache, nasal congestion, CV:  No-   chest pain,  +orthopnea, PND, swelling in lower extremities, anasarca,  dizziness, palpitations Resp: +shortness of breath with exertion or at rest.               productive cough,   +non-productive cough,  No- coughing up of blood.             change in color of mucus.   wheezing.   Skin: No-   rash or lesions. GI:  No-   heartburn, indigestion, abdominal pain, nausea, vomiting,  GU: MS:  No-   joint pain or swelling.  . Neuro-     nothing unusual Psych:  No- change in mood or affect. No depression or anxiety.  No memory loss.   Objective:   Physical Exam General- Alert, Oriented, Affect-appropriate, Distress- none acute,   +obese/ wheelchair Skin- rash-none, lesions- none, excoriation- none Lymphadenopathy- none Head- atraumatic            Eyes- Gross vision intact, PERRLA, conjunctivae clear secretions.             Ears- Hearing, canals-normal            Nose-  no-Septal dev, mucus, polyps, erosion, perforation             Throat- Mallampati II , mucosa clear , drainage- none, tonsils- atrophic Neck- flexible , trachea midline, no  stridor , thyroid nl, carotid no bruit Chest - symmetrical excursion , unlabored           Heart/CV-+ almost regular , no murmur , no gallop  , no rub, nl s1 s2                           - JVD- none , edema- 1+ , stasis changes- none, varices- none           Lung-     +few crackles/unlabored, wheeze- none            Chest wall-  Abd-  Br/ Gen/ Rectal- Not done, not indicated Extrem- cyanosis- none, clubbing, none,  atrophy- none, strength- nl, + heavy legs with edema,           Neuro- grossly intact to observation

## 2019-01-01 LAB — CBC WITH DIFFERENTIAL/PLATELET
Absolute Monocytes: 473 cells/uL (ref 200–950)
Basophils Absolute: 22 cells/uL (ref 0–200)
Basophils Relative: 0.5 %
Eosinophils Absolute: 636 cells/uL — ABNORMAL HIGH (ref 15–500)
Eosinophils Relative: 14.8 %
HCT: 27.9 % — ABNORMAL LOW (ref 35.0–45.0)
Hemoglobin: 9.2 g/dL — ABNORMAL LOW (ref 11.7–15.5)
Lymphs Abs: 654 cells/uL — ABNORMAL LOW (ref 850–3900)
MCH: 33.6 pg — ABNORMAL HIGH (ref 27.0–33.0)
MCHC: 33 g/dL (ref 32.0–36.0)
MCV: 101.8 fL — ABNORMAL HIGH (ref 80.0–100.0)
MPV: 10.5 fL (ref 7.5–12.5)
Monocytes Relative: 11 %
Neutro Abs: 2516 cells/uL (ref 1500–7800)
Neutrophils Relative %: 58.5 %
Platelets: 96 10*3/uL — ABNORMAL LOW (ref 140–400)
RBC: 2.74 10*6/uL — ABNORMAL LOW (ref 3.80–5.10)
RDW: 12.4 % (ref 11.0–15.0)
Total Lymphocyte: 15.2 %
WBC: 4.3 10*3/uL (ref 3.8–10.8)

## 2019-01-01 LAB — ANTI-NUCLEAR AB-TITER (ANA TITER)
ANA TITER: 1:80 {titer} — ABNORMAL HIGH
ANA Titer 1: 1:80 {titer} — ABNORMAL HIGH

## 2019-01-01 LAB — SEDIMENTATION RATE: Sed Rate: 70 mm/h — ABNORMAL HIGH (ref 0–30)

## 2019-01-01 LAB — ANA: Anti Nuclear Antibody (ANA): POSITIVE — AB

## 2019-01-01 LAB — RHEUMATOID FACTOR: Rheumatoid fact SerPl-aCnc: 43 IU/mL — ABNORMAL HIGH (ref <14)

## 2019-01-02 DIAGNOSIS — J449 Chronic obstructive pulmonary disease, unspecified: Secondary | ICD-10-CM | POA: Diagnosis not present

## 2019-01-05 ENCOUNTER — Telehealth: Payer: Self-pay | Admitting: Internal Medicine

## 2019-01-05 DIAGNOSIS — J4540 Moderate persistent asthma, uncomplicated: Secondary | ICD-10-CM | POA: Diagnosis not present

## 2019-01-05 DIAGNOSIS — I509 Heart failure, unspecified: Secondary | ICD-10-CM | POA: Diagnosis not present

## 2019-01-05 DIAGNOSIS — E7800 Pure hypercholesterolemia, unspecified: Secondary | ICD-10-CM

## 2019-01-05 DIAGNOSIS — J449 Chronic obstructive pulmonary disease, unspecified: Secondary | ICD-10-CM | POA: Diagnosis not present

## 2019-01-05 DIAGNOSIS — I2510 Atherosclerotic heart disease of native coronary artery without angina pectoris: Secondary | ICD-10-CM | POA: Diagnosis not present

## 2019-01-05 DIAGNOSIS — D696 Thrombocytopenia, unspecified: Secondary | ICD-10-CM

## 2019-01-05 DIAGNOSIS — I4891 Unspecified atrial fibrillation: Secondary | ICD-10-CM

## 2019-01-05 DIAGNOSIS — R739 Hyperglycemia, unspecified: Secondary | ICD-10-CM

## 2019-01-05 DIAGNOSIS — D649 Anemia, unspecified: Secondary | ICD-10-CM

## 2019-01-05 DIAGNOSIS — G40909 Epilepsy, unspecified, not intractable, without status epilepticus: Secondary | ICD-10-CM

## 2019-01-05 DIAGNOSIS — I110 Hypertensive heart disease with heart failure: Secondary | ICD-10-CM | POA: Diagnosis not present

## 2019-01-05 DIAGNOSIS — G8929 Other chronic pain: Secondary | ICD-10-CM | POA: Diagnosis not present

## 2019-01-05 DIAGNOSIS — M549 Dorsalgia, unspecified: Secondary | ICD-10-CM | POA: Diagnosis not present

## 2019-01-05 DIAGNOSIS — I10 Essential (primary) hypertension: Secondary | ICD-10-CM

## 2019-01-05 DIAGNOSIS — E1140 Type 2 diabetes mellitus with diabetic neuropathy, unspecified: Secondary | ICD-10-CM | POA: Diagnosis not present

## 2019-01-05 NOTE — Telephone Encounter (Signed)
Pt called about going to Dr Annamaria Boots pulmonologist in [place] she states the dr wants to talk to Dr Nicki Reaper about pt iron being low. Please advise and Thank you!  Call pt @ [phone].

## 2019-01-07 ENCOUNTER — Other Ambulatory Visit: Payer: Self-pay | Admitting: Neurology

## 2019-01-07 ENCOUNTER — Other Ambulatory Visit: Payer: Self-pay | Admitting: Internal Medicine

## 2019-01-09 ENCOUNTER — Other Ambulatory Visit: Payer: Self-pay

## 2019-01-09 DIAGNOSIS — E1140 Type 2 diabetes mellitus with diabetic neuropathy, unspecified: Secondary | ICD-10-CM | POA: Diagnosis not present

## 2019-01-09 DIAGNOSIS — I2510 Atherosclerotic heart disease of native coronary artery without angina pectoris: Secondary | ICD-10-CM | POA: Diagnosis not present

## 2019-01-09 DIAGNOSIS — G40909 Epilepsy, unspecified, not intractable, without status epilepticus: Secondary | ICD-10-CM | POA: Diagnosis not present

## 2019-01-09 DIAGNOSIS — J449 Chronic obstructive pulmonary disease, unspecified: Secondary | ICD-10-CM | POA: Diagnosis not present

## 2019-01-09 DIAGNOSIS — I509 Heart failure, unspecified: Secondary | ICD-10-CM | POA: Diagnosis not present

## 2019-01-09 DIAGNOSIS — G8929 Other chronic pain: Secondary | ICD-10-CM | POA: Diagnosis not present

## 2019-01-09 DIAGNOSIS — M549 Dorsalgia, unspecified: Secondary | ICD-10-CM | POA: Diagnosis not present

## 2019-01-09 DIAGNOSIS — J4540 Moderate persistent asthma, uncomplicated: Secondary | ICD-10-CM | POA: Diagnosis not present

## 2019-01-09 DIAGNOSIS — I110 Hypertensive heart disease with heart failure: Secondary | ICD-10-CM | POA: Diagnosis not present

## 2019-01-09 MED ORDER — PHENYTOIN SODIUM EXTENDED 100 MG PO CAPS: ORAL_CAPSULE | ORAL | 3 refills | Status: DC

## 2019-01-09 NOTE — Telephone Encounter (Signed)
Please call and let pt know that I was not in the office last week.  Sorry for delay in responding.  Please confirm she has not noticed any bleeding or problems.  Hgb is low.  If any acute issues, needs to be evaluated.  Will need f/u labs.  Schedule non fasting lab appt this week.  Let me know either wat about the acute issues.

## 2019-01-09 NOTE — Telephone Encounter (Signed)
Confirmed that patient is doing ok. No acute issues noted. Patient has an appt with Dr Annamaria Boots on Wednesday. Wanted to know if she could have her labs drawn there. I have called and left message for them to call back.

## 2019-01-11 ENCOUNTER — Inpatient Hospital Stay: Admission: RE | Admit: 2019-01-11 | Payer: Medicare PPO | Source: Ambulatory Visit

## 2019-01-12 DIAGNOSIS — J4540 Moderate persistent asthma, uncomplicated: Secondary | ICD-10-CM | POA: Diagnosis not present

## 2019-01-12 DIAGNOSIS — E1140 Type 2 diabetes mellitus with diabetic neuropathy, unspecified: Secondary | ICD-10-CM | POA: Diagnosis not present

## 2019-01-12 DIAGNOSIS — I2510 Atherosclerotic heart disease of native coronary artery without angina pectoris: Secondary | ICD-10-CM | POA: Diagnosis not present

## 2019-01-12 DIAGNOSIS — G8929 Other chronic pain: Secondary | ICD-10-CM | POA: Diagnosis not present

## 2019-01-12 DIAGNOSIS — I509 Heart failure, unspecified: Secondary | ICD-10-CM | POA: Diagnosis not present

## 2019-01-12 DIAGNOSIS — J449 Chronic obstructive pulmonary disease, unspecified: Secondary | ICD-10-CM | POA: Diagnosis not present

## 2019-01-12 DIAGNOSIS — M549 Dorsalgia, unspecified: Secondary | ICD-10-CM | POA: Diagnosis not present

## 2019-01-12 DIAGNOSIS — G40909 Epilepsy, unspecified, not intractable, without status epilepticus: Secondary | ICD-10-CM | POA: Diagnosis not present

## 2019-01-12 DIAGNOSIS — I110 Hypertensive heart disease with heart failure: Secondary | ICD-10-CM | POA: Diagnosis not present

## 2019-01-13 ENCOUNTER — Other Ambulatory Visit: Payer: Self-pay | Admitting: Cardiology

## 2019-01-13 DIAGNOSIS — R0602 Shortness of breath: Secondary | ICD-10-CM

## 2019-01-13 DIAGNOSIS — I25118 Atherosclerotic heart disease of native coronary artery with other forms of angina pectoris: Secondary | ICD-10-CM

## 2019-01-13 NOTE — Telephone Encounter (Signed)
Scheduled patient for next week. She could not come until Wednesday. She has other appointments. Still no acute issues noted. I have scheduled her for 1/13 to have non fasting lab. She has labs ordered future but was not sure exactly what you wanted drawn.

## 2019-01-14 DIAGNOSIS — D539 Nutritional anemia, unspecified: Secondary | ICD-10-CM | POA: Insufficient documentation

## 2019-01-14 NOTE — Telephone Encounter (Signed)
I have placed the order for fasting labs.  Also, after review of labs, she also needs an abdominal ultrasound - to further evaluate etiology of decreased platelets.  If agreeable, let me know and I will place the order for the ultrasound.

## 2019-01-14 NOTE — Addendum Note (Signed)
Addended by: Alisa Graff on: 01/14/2019 09:19 AM   Modules accepted: Orders

## 2019-01-16 ENCOUNTER — Other Ambulatory Visit: Payer: Self-pay

## 2019-01-16 NOTE — Telephone Encounter (Signed)
Patient is aware 

## 2019-01-16 NOTE — Telephone Encounter (Signed)
Pt is agreeable to abdominal ultrasound.

## 2019-01-16 NOTE — Addendum Note (Signed)
Addended by: Alisa Graff on: 01/16/2019 12:59 PM   Modules accepted: Orders

## 2019-01-16 NOTE — Telephone Encounter (Signed)
Order placed for abdominal ultrasound.  Someone should be calling her with appt date and time.  Needs to make sure she keeps lab appt.  Needs asap.  Thanks.

## 2019-01-17 ENCOUNTER — Ambulatory Visit (INDEPENDENT_AMBULATORY_CARE_PROVIDER_SITE_OTHER)
Admission: RE | Admit: 2019-01-17 | Discharge: 2019-01-17 | Disposition: A | Discharge status: Home / Self Care | Payer: Medicare PPO | Source: Ambulatory Visit | Attending: Internal Medicine | Admitting: Internal Medicine

## 2019-01-17 DIAGNOSIS — I110 Hypertensive heart disease with heart failure: Secondary | ICD-10-CM | POA: Diagnosis not present

## 2019-01-17 DIAGNOSIS — J449 Chronic obstructive pulmonary disease, unspecified: Secondary | ICD-10-CM | POA: Diagnosis not present

## 2019-01-17 DIAGNOSIS — J849 Interstitial pulmonary disease, unspecified: Secondary | ICD-10-CM

## 2019-01-17 DIAGNOSIS — I288 Other diseases of pulmonary vessels: Secondary | ICD-10-CM | POA: Diagnosis not present

## 2019-01-17 DIAGNOSIS — I700 Atherosclerosis of aorta: Secondary | ICD-10-CM | POA: Diagnosis not present

## 2019-01-17 DIAGNOSIS — G8929 Other chronic pain: Secondary | ICD-10-CM | POA: Diagnosis not present

## 2019-01-17 DIAGNOSIS — J90 Pleural effusion, not elsewhere classified: Secondary | ICD-10-CM | POA: Diagnosis not present

## 2019-01-17 DIAGNOSIS — I509 Heart failure, unspecified: Secondary | ICD-10-CM | POA: Diagnosis not present

## 2019-01-17 DIAGNOSIS — J4540 Moderate persistent asthma, uncomplicated: Secondary | ICD-10-CM | POA: Diagnosis not present

## 2019-01-17 DIAGNOSIS — E1140 Type 2 diabetes mellitus with diabetic neuropathy, unspecified: Secondary | ICD-10-CM | POA: Diagnosis not present

## 2019-01-17 DIAGNOSIS — G40909 Epilepsy, unspecified, not intractable, without status epilepticus: Secondary | ICD-10-CM | POA: Diagnosis not present

## 2019-01-17 DIAGNOSIS — I2510 Atherosclerotic heart disease of native coronary artery without angina pectoris: Secondary | ICD-10-CM | POA: Diagnosis not present

## 2019-01-17 DIAGNOSIS — M549 Dorsalgia, unspecified: Secondary | ICD-10-CM | POA: Diagnosis not present

## 2019-01-18 ENCOUNTER — Ambulatory Visit
Admission: RE | Admit: 2019-01-18 | Discharge: 2019-01-18 | Disposition: A | Discharge status: Home / Self Care | Payer: Medicare PPO | Source: Ambulatory Visit | Attending: Cardiology | Admitting: Cardiology

## 2019-01-18 ENCOUNTER — Other Ambulatory Visit: Payer: Self-pay

## 2019-01-18 ENCOUNTER — Other Ambulatory Visit: Payer: Medicare PPO

## 2019-01-18 DIAGNOSIS — R0602 Shortness of breath: Secondary | ICD-10-CM | POA: Insufficient documentation

## 2019-01-18 DIAGNOSIS — I25118 Atherosclerotic heart disease of native coronary artery with other forms of angina pectoris: Secondary | ICD-10-CM | POA: Diagnosis not present

## 2019-01-18 LAB — NM MYOCAR MULTI W/SPECT W/WALL MOTION / EF
Estimated workload: 1 METS
Exercise duration (min): 1 min
Exercise duration (sec): 14 s
LV dias vol: 91 mL (ref 46–106)
LV sys vol: 25 mL
Peak HR: 100 {beats}/min
Percent HR: 72 %
Rest HR: 90 {beats}/min
SDS: 0
SRS: 6
SSS: 14
TID: 0.87

## 2019-01-18 MED ORDER — REGADENOSON 0.4 MG/5ML IV SOLN
0.4000 mg | Freq: Once | INTRAVENOUS | Status: AC
  Administered 2019-01-18: 0.4 mg via INTRAVENOUS

## 2019-01-18 MED ORDER — TECHNETIUM TC 99M TETROFOSMIN IV KIT
10.0000 | PACK | Freq: Once | INTRAVENOUS | Status: AC | PRN
  Administered 2019-01-18: 11.01 via INTRAVENOUS

## 2019-01-18 MED ORDER — TECHNETIUM TC 99M TETROFOSMIN IV KIT
31.5100 | PACK | Freq: Once | INTRAVENOUS | Status: AC | PRN
  Administered 2019-01-18: 31.51 via INTRAVENOUS

## 2019-01-19 ENCOUNTER — Other Ambulatory Visit: Payer: Self-pay

## 2019-01-19 ENCOUNTER — Other Ambulatory Visit (INDEPENDENT_AMBULATORY_CARE_PROVIDER_SITE_OTHER): Payer: Medicare PPO

## 2019-01-19 ENCOUNTER — Telehealth: Payer: Self-pay | Admitting: Internal Medicine

## 2019-01-19 DIAGNOSIS — D649 Anemia, unspecified: Secondary | ICD-10-CM

## 2019-01-19 DIAGNOSIS — E7800 Pure hypercholesterolemia, unspecified: Secondary | ICD-10-CM | POA: Diagnosis not present

## 2019-01-19 DIAGNOSIS — G8929 Other chronic pain: Secondary | ICD-10-CM | POA: Diagnosis not present

## 2019-01-19 DIAGNOSIS — J4540 Moderate persistent asthma, uncomplicated: Secondary | ICD-10-CM | POA: Diagnosis not present

## 2019-01-19 DIAGNOSIS — J449 Chronic obstructive pulmonary disease, unspecified: Secondary | ICD-10-CM | POA: Diagnosis not present

## 2019-01-19 DIAGNOSIS — R739 Hyperglycemia, unspecified: Secondary | ICD-10-CM | POA: Diagnosis not present

## 2019-01-19 DIAGNOSIS — E1140 Type 2 diabetes mellitus with diabetic neuropathy, unspecified: Secondary | ICD-10-CM | POA: Diagnosis not present

## 2019-01-19 DIAGNOSIS — I509 Heart failure, unspecified: Secondary | ICD-10-CM | POA: Diagnosis not present

## 2019-01-19 DIAGNOSIS — I2510 Atherosclerotic heart disease of native coronary artery without angina pectoris: Secondary | ICD-10-CM | POA: Diagnosis not present

## 2019-01-19 DIAGNOSIS — G40909 Epilepsy, unspecified, not intractable, without status epilepticus: Secondary | ICD-10-CM | POA: Diagnosis not present

## 2019-01-19 DIAGNOSIS — I10 Essential (primary) hypertension: Secondary | ICD-10-CM

## 2019-01-19 DIAGNOSIS — M549 Dorsalgia, unspecified: Secondary | ICD-10-CM | POA: Diagnosis not present

## 2019-01-19 DIAGNOSIS — I110 Hypertensive heart disease with heart failure: Secondary | ICD-10-CM | POA: Diagnosis not present

## 2019-01-19 LAB — VITAMIN B12: Vitamin B-12: 781 pg/mL (ref 211–911)

## 2019-01-19 LAB — CBC WITH DIFFERENTIAL/PLATELET
Basophils Absolute: 0 10*3/uL (ref 0.0–0.1)
Basophils Relative: 0.4 % (ref 0.0–3.0)
Eosinophils Absolute: 0.5 10*3/uL (ref 0.0–0.7)
Eosinophils Relative: 11.2 % — ABNORMAL HIGH (ref 0.0–5.0)
HCT: 31.3 % — ABNORMAL LOW (ref 36.0–46.0)
Hemoglobin: 10 g/dL — ABNORMAL LOW (ref 12.0–15.0)
Lymphocytes Relative: 15.7 % (ref 12.0–46.0)
Lymphs Abs: 0.7 10*3/uL (ref 0.7–4.0)
MCHC: 31.8 g/dL (ref 30.0–36.0)
MCV: 108.6 fl — ABNORMAL HIGH (ref 78.0–100.0)
Monocytes Absolute: 0.3 10*3/uL (ref 0.1–1.0)
Monocytes Relative: 7.6 % (ref 3.0–12.0)
Neutro Abs: 2.9 10*3/uL (ref 1.4–7.7)
Neutrophils Relative %: 65.1 % (ref 43.0–77.0)
Platelets: 109 10*3/uL — ABNORMAL LOW (ref 150.0–400.0)
RBC: 2.88 Mil/uL — ABNORMAL LOW (ref 3.87–5.11)
RDW: 16.9 % — ABNORMAL HIGH (ref 11.5–15.5)
WBC: 4.5 10*3/uL (ref 4.0–10.5)

## 2019-01-19 LAB — BASIC METABOLIC PANEL
BUN: 17 mg/dL (ref 6–23)
CO2: 23 mEq/L (ref 19–32)
Calcium: 8 mg/dL — ABNORMAL LOW (ref 8.4–10.5)
Chloride: 109 mEq/L (ref 96–112)
Creatinine, Ser: 1.02 mg/dL (ref 0.40–1.20)
GFR: 51.81 mL/min — ABNORMAL LOW (ref >60.00)
Glucose, Bld: 141 mg/dL — ABNORMAL HIGH (ref 70–99)
Potassium: 3.9 mEq/L (ref 3.5–5.1)
Sodium: 137 mEq/L (ref 135–145)

## 2019-01-19 LAB — HEPATIC FUNCTION PANEL
ALT: 9 U/L (ref 0–35)
AST: 26 U/L (ref 0–37)
Albumin: 2.9 g/dL — ABNORMAL LOW (ref 3.5–5.2)
Alkaline Phosphatase: 149 U/L — ABNORMAL HIGH (ref 39–117)
Bilirubin, Direct: 0.3 mg/dL (ref 0.0–0.3)
Total Bilirubin: 0.9 mg/dL (ref 0.2–1.2)
Total Protein: 7.9 g/dL (ref 6.0–8.3)

## 2019-01-19 LAB — IBC + FERRITIN
Ferritin: 43.7 ng/mL (ref 10.0–291.0)
Iron: 100 ug/dL (ref 42–145)
Saturation Ratios: 43.3 % (ref 20.0–50.0)
Transferrin: 165 mg/dL — ABNORMAL LOW (ref 212.0–360.0)

## 2019-01-19 LAB — LIPID PANEL
Cholesterol: 97 mg/dL (ref 0–200)
HDL: 31.8 mg/dL — ABNORMAL LOW (ref >39.00)
LDL Cholesterol: 50 mg/dL (ref 0–99)
NonHDL: 65.59
Total CHOL/HDL Ratio: 3
Triglycerides: 77 mg/dL (ref 0.0–149.0)
VLDL: 15.4 mg/dL (ref 0.0–40.0)

## 2019-01-19 LAB — HEMOGLOBIN A1C: Hgb A1c MFr Bld: 4.8 % (ref 4.6–6.5)

## 2019-01-19 NOTE — Telephone Encounter (Signed)
Discussed with nurse.  Pt was in no acute distress today.  She has oxygen at home that she can use, just needs portable oxygen arranged.  Nurse note reviewed.

## 2019-01-19 NOTE — Telephone Encounter (Signed)
Patient came into office for labs requesting that she needed portable 02 at home that 02 sats at home had dropped to 40 % while ambulating. Patient 02 sats in office were 94-97 % on room air , advised patient to qualify for oxygen she would have to ambulate with walker and 0 2 sats drop to at Least 88 % on room air , patient and daughter agreed so Nurse visit was scheduled. Temp 97.9, pulse 89 Bp 110/68 resp 20, 02 sats 97% upon leaving on room air.

## 2019-01-20 LAB — PHENYTOIN LEVEL, TOTAL: Phenytoin, Total: 8.3 mg/L — ABNORMAL LOW (ref 10.0–20.0)

## 2019-01-24 ENCOUNTER — Other Ambulatory Visit: Payer: Self-pay | Admitting: Internal Medicine

## 2019-01-24 DIAGNOSIS — J449 Chronic obstructive pulmonary disease, unspecified: Secondary | ICD-10-CM | POA: Diagnosis not present

## 2019-01-24 DIAGNOSIS — I2510 Atherosclerotic heart disease of native coronary artery without angina pectoris: Secondary | ICD-10-CM | POA: Diagnosis not present

## 2019-01-24 DIAGNOSIS — G40909 Epilepsy, unspecified, not intractable, without status epilepticus: Secondary | ICD-10-CM | POA: Diagnosis not present

## 2019-01-24 DIAGNOSIS — M549 Dorsalgia, unspecified: Secondary | ICD-10-CM | POA: Diagnosis not present

## 2019-01-24 DIAGNOSIS — G8929 Other chronic pain: Secondary | ICD-10-CM | POA: Diagnosis not present

## 2019-01-24 DIAGNOSIS — D649 Anemia, unspecified: Secondary | ICD-10-CM

## 2019-01-24 DIAGNOSIS — I110 Hypertensive heart disease with heart failure: Secondary | ICD-10-CM | POA: Diagnosis not present

## 2019-01-24 DIAGNOSIS — I509 Heart failure, unspecified: Secondary | ICD-10-CM | POA: Diagnosis not present

## 2019-01-24 DIAGNOSIS — J4540 Moderate persistent asthma, uncomplicated: Secondary | ICD-10-CM | POA: Diagnosis not present

## 2019-01-24 DIAGNOSIS — E1140 Type 2 diabetes mellitus with diabetic neuropathy, unspecified: Secondary | ICD-10-CM | POA: Diagnosis not present

## 2019-01-24 NOTE — Progress Notes (Signed)
Order placed for hematology referral.  

## 2019-01-25 ENCOUNTER — Ambulatory Visit: Payer: Medicare PPO | Admitting: *Deleted

## 2019-01-25 ENCOUNTER — Other Ambulatory Visit: Payer: Self-pay

## 2019-01-25 DIAGNOSIS — R0902 Hypoxemia: Secondary | ICD-10-CM

## 2019-01-25 NOTE — Progress Notes (Signed)
Patient came in for ambulatory 02 sats because she has had several episodes at home while walking she became short of breath and checked oxygen with pulse ox at home and 02 reading would be 83% per patient.  Patient at rest 02 sats on room air 97% after walking less them 1 minute patient became short of breath and had to stop ambulating at this time oxygen saturation per pulse ox  @ 83% while standing and pulse rate 120, had patient sit in wheelchaitr applied 02 sats came back to 94 % and heart rate  89. Patient ambulating with 02 sats remained at 94% while ambulating on 2 L and pulse remained in the 80's. See orders for 02 sats in telephone note.01/19/19

## 2019-01-26 ENCOUNTER — Other Ambulatory Visit: Payer: Self-pay

## 2019-01-26 ENCOUNTER — Inpatient Hospital Stay: Payer: Medicare PPO

## 2019-01-26 ENCOUNTER — Encounter: Payer: Self-pay | Admitting: Oncology

## 2019-01-26 ENCOUNTER — Other Ambulatory Visit: Payer: Self-pay | Admitting: *Deleted

## 2019-01-26 ENCOUNTER — Inpatient Hospital Stay: Payer: Medicare PPO | Attending: Oncology | Admitting: Oncology

## 2019-01-26 VITALS — BP 136/76 | HR 76 | Temp 97.5°F | Wt 230.0 lb

## 2019-01-26 DIAGNOSIS — K7460 Unspecified cirrhosis of liver: Secondary | ICD-10-CM | POA: Diagnosis not present

## 2019-01-26 DIAGNOSIS — Z7982 Long term (current) use of aspirin: Secondary | ICD-10-CM | POA: Insufficient documentation

## 2019-01-26 DIAGNOSIS — Z8673 Personal history of transient ischemic attack (TIA), and cerebral infarction without residual deficits: Secondary | ICD-10-CM | POA: Diagnosis not present

## 2019-01-26 DIAGNOSIS — D539 Nutritional anemia, unspecified: Secondary | ICD-10-CM

## 2019-01-26 DIAGNOSIS — D649 Anemia, unspecified: Secondary | ICD-10-CM | POA: Insufficient documentation

## 2019-01-26 DIAGNOSIS — J4540 Moderate persistent asthma, uncomplicated: Secondary | ICD-10-CM | POA: Diagnosis not present

## 2019-01-26 DIAGNOSIS — Z79899 Other long term (current) drug therapy: Secondary | ICD-10-CM | POA: Diagnosis not present

## 2019-01-26 DIAGNOSIS — Z7901 Long term (current) use of anticoagulants: Secondary | ICD-10-CM | POA: Insufficient documentation

## 2019-01-26 DIAGNOSIS — J849 Interstitial pulmonary disease, unspecified: Secondary | ICD-10-CM | POA: Diagnosis not present

## 2019-01-26 DIAGNOSIS — I110 Hypertensive heart disease with heart failure: Secondary | ICD-10-CM | POA: Diagnosis not present

## 2019-01-26 DIAGNOSIS — E7800 Pure hypercholesterolemia, unspecified: Secondary | ICD-10-CM | POA: Diagnosis not present

## 2019-01-26 DIAGNOSIS — I2510 Atherosclerotic heart disease of native coronary artery without angina pectoris: Secondary | ICD-10-CM | POA: Diagnosis not present

## 2019-01-26 DIAGNOSIS — Z955 Presence of coronary angioplasty implant and graft: Secondary | ICD-10-CM | POA: Insufficient documentation

## 2019-01-26 DIAGNOSIS — E669 Obesity, unspecified: Secondary | ICD-10-CM | POA: Insufficient documentation

## 2019-01-26 DIAGNOSIS — I4891 Unspecified atrial fibrillation: Secondary | ICD-10-CM | POA: Insufficient documentation

## 2019-01-26 DIAGNOSIS — D696 Thrombocytopenia, unspecified: Secondary | ICD-10-CM

## 2019-01-26 DIAGNOSIS — J90 Pleural effusion, not elsewhere classified: Secondary | ICD-10-CM | POA: Insufficient documentation

## 2019-01-26 DIAGNOSIS — Z6839 Body mass index (BMI) 39.0-39.9, adult: Secondary | ICD-10-CM | POA: Diagnosis not present

## 2019-01-26 NOTE — Progress Notes (Signed)
Patient stated that she is here to establish care for iron deficiency anemia. Patient stated that she had been feeling tired and fatigued.

## 2019-01-26 NOTE — Progress Notes (Signed)
You may have this note.  Reviewed Brooke Baker's note.  Decrease oxygen saturation with ambulation.  Need to arrange for her to have portable oxygen.  Also, she has seen cardiology.  Did they schedule her for cardiac stress testing?  Has she had the test?  If no, is it scheduled?  Also, she sees Dr Annamaria Boots - pulmonary.  If I remember correctly, he had scheduled her for PFTs.  Make sure to forward this information to him to confirm if any further pulmonary w/up warranted.

## 2019-01-27 ENCOUNTER — Telehealth: Payer: Self-pay

## 2019-01-27 DIAGNOSIS — J449 Chronic obstructive pulmonary disease, unspecified: Secondary | ICD-10-CM | POA: Diagnosis not present

## 2019-01-27 DIAGNOSIS — I2510 Atherosclerotic heart disease of native coronary artery without angina pectoris: Secondary | ICD-10-CM | POA: Diagnosis not present

## 2019-01-27 DIAGNOSIS — E1140 Type 2 diabetes mellitus with diabetic neuropathy, unspecified: Secondary | ICD-10-CM | POA: Diagnosis not present

## 2019-01-27 DIAGNOSIS — J4540 Moderate persistent asthma, uncomplicated: Secondary | ICD-10-CM | POA: Diagnosis not present

## 2019-01-27 DIAGNOSIS — G40909 Epilepsy, unspecified, not intractable, without status epilepticus: Secondary | ICD-10-CM | POA: Diagnosis not present

## 2019-01-27 DIAGNOSIS — I110 Hypertensive heart disease with heart failure: Secondary | ICD-10-CM | POA: Diagnosis not present

## 2019-01-27 DIAGNOSIS — R0902 Hypoxemia: Secondary | ICD-10-CM

## 2019-01-27 DIAGNOSIS — I509 Heart failure, unspecified: Secondary | ICD-10-CM | POA: Diagnosis not present

## 2019-01-27 DIAGNOSIS — M549 Dorsalgia, unspecified: Secondary | ICD-10-CM | POA: Diagnosis not present

## 2019-01-27 DIAGNOSIS — G8929 Other chronic pain: Secondary | ICD-10-CM | POA: Diagnosis not present

## 2019-01-27 NOTE — Progress Notes (Signed)
Oxygen ordered. Contacted Magda Paganini and [name] at [facility]. Tanks should be delivered Monday. They will call if any problems.

## 2019-01-27 NOTE — Progress Notes (Signed)
I am waiting to hear back from pt about cardiology but looking in her chart she does have f/u PFT and visit with Dr Annamaria Boots at pulmonary on 05/01/19.

## 2019-01-29 ENCOUNTER — Encounter: Payer: Self-pay | Admitting: Oncology

## 2019-01-29 NOTE — Progress Notes (Signed)
Hematology/Oncology Consult note [name] patient is a 85 year old female with multiple comorbidities who has been referred to [place] for anemia.  [place] Telephone:([phone] [phone]  Patient Care Team: Brooke Pheasant, MD as PCP - General   Name of the patient: Brooke Baker  [id number]  06/04/1934    Reason for referral- anemia   Referring physician- Dr. Einar Baker  Date of visit: 01/29/19   History of presenting illness-patient is a 85 year old female with multiple comorbidities.  At baseline she is able to walk with a walker but overall has limited mobility.  She has had frequent falls in the recent past.  Reports the last time he had.  Her most recent blood work from 01/19/2019 showed a white count of 4.5, H&H of 10/31.3 with an MCV of 108 and a platelet count of 109.  Looking back at her prior counts patient had a normal platelet count of 1.  June 2018 and then starting 2019 she has had platelet count fluctuating between 90-1 30.  Also her hemoglobin is gradually drifted down from 12-10 over the last 2 years.  Recent B12 level was normal at 781.  Iron study showed normal iron levels of 100 and a ferritin of 23.7.  ANA was positive.  Rheumatoid factor was high at 43.  Kidney functions were normal.  She does follow-up with rheumatology and also has history of psoriatic arthritis.  ECOG PS- 2  Pain scale- 3   Review of systems- Review of Systems  Constitutional: Positive for malaise/fatigue. Negative for chills, fever and weight loss.  HENT: Negative for congestion, ear discharge and nosebleeds.   Eyes: Negative for blurred vision.  Respiratory: Negative for cough, hemoptysis, sputum production, shortness of breath and wheezing.   Cardiovascular: Negative for chest pain, palpitations, orthopnea and claudication.  Gastrointestinal: Negative for abdominal pain, blood in stool, constipation, diarrhea, heartburn, melena, nausea and vomiting.    Genitourinary: Negative for dysuria, flank pain, frequency, hematuria and urgency.  Musculoskeletal: Positive for joint pain. Negative for back pain and myalgias.  Skin: Negative for rash.  Neurological: Negative for dizziness, tingling, focal weakness, seizures, weakness and headaches.  Endo/Heme/Allergies: Does not bruise/bleed easily.  Psychiatric/Behavioral: Negative for depression and suicidal ideas. The patient does not have insomnia.     Allergies  Allergen Reactions  . Doxycycline Diarrhea and Nausea Only  . Methotrexate Rash    Patient Active Problem List   Diagnosis Date Noted  . Anemia 01/14/2019  . Skin lesion 11/13/2018  . Leg lesion 11/13/2018  . Cough 09/25/2018  . Fall 09/25/2018  . Thrombocytopenia ([place]) 09/11/2018  . Hyperglycemia 09/11/2018  . ILD (interstitial lung disease) ([place]) 01/14/2018  . CHF (congestive heart failure) ([place]) 07/01/2017  . Hypoxia 07/01/2017  . Therapeutic drug monitoring 03/25/2017  . BMI 39.0-39.9,adult 03/07/2017  . Falls frequently 03/18/2016  . Psoriatic arthritis ([place]) 05/14/2015  . Health care maintenance 10/31/2014  . Stress 12/03/2013  . Obesity 08/01/2013  . Neuropathy 08/01/2013  . Macrocytosis 08/01/2013  . Hypertension 12/18/2011  . Chronic back pain 12/18/2011  . Seizure disorder ([place]) 12/18/2011  . Atrial fibrillation ([place]) 12/18/2011  . Hypercholesterolemia 12/18/2011  . Asthma, moderate persistent 04/08/2009  . Coronary atherosclerosis 04/19/2007  . History of CVA (cerebrovascular accident) 04/19/2007  . ASTHMATIC BRONCHITIS, ACUTE 04/19/2007  . Seasonal and perennial allergic rhinitis 04/19/2007     Past Medical History:  Diagnosis Date  . Acute bronchitis   . Allergic rhinitis   . Arthritis   .  CAD (coronary artery disease)    s/p stent LAD 8/03 and stent placement OM1  . Cancer ([place])    skin  . Chronic back pain   . COPD with asthma ([place])   . CVA (cerebral vascular accident) ([place])   . Diabetes  mellitus ([place])   . Hypercholesterolemia   . Hypertension   . Seizure disorder [facility])      Past Surgical History:  Procedure Laterality Date  . BREAST BIOPSY Bilateral    axillas  . CORONARY STENT PLACEMENT  8/03   LAD  . CORONARY STENT PLACEMENT     OM1  2006  . FEMUR FRACTURE SURGERY Right [id number]   Dr. Marry Guan  . LUMBAR SPINE SURGERY     x3  . TOTAL ABDOMINAL HYSTERECTOMY     appendectomy - age 20  . TOTAL HIP ARTHROPLASTY    . TOTAL KNEE ARTHROPLASTY     2005    Social History   Socioeconomic History  . Marital status: Married    Spouse name: Not on file  . Number of children: 3  . Years of education: hs  . Highest education level: Not on file  Occupational History  . Occupation: retired Psychologist, sport and exercise: RETIRED  Tobacco Use  . Smoking status: Never Smoker  . Smokeless tobacco: Never Used  Substance and Sexual Activity  . Alcohol use: No    Alcohol/week: 0.0 standard drinks  . Drug use: No  . Sexual activity: Never  Other Topics Concern  . Not on file  Social History Narrative   lives with husband- Edsel   Social Determinants of Health   Financial Resource Strain:   . Difficulty of Paying Living Expenses: Not on file  Food Insecurity:   . Worried About Charity fundraiser in the Last Year: Not on file  . Ran Out of Food in the Last Year: Not on file  Transportation Needs:   . Lack of Transportation (Medical): Not on file  . Lack of Transportation (Non-Medical): Not on file  Physical Activity:   . Days of Exercise per Week: Not on file  . Minutes of Exercise per Session: Not on file  Stress:   . Feeling of Stress : Not on file  Social Connections:   . Frequency of Communication with Friends and Family: Not on file  . Frequency of Social Gatherings with Friends and Family: Not on file  . Attends Religious Services: Not on file  . Active Member of Clubs or Organizations: Not on file  . Attends Archivist Meetings: Not on file  .  Marital Status: Not on file  Intimate Partner Violence:   . Fear of Current or Ex-Partner: Not on file  . Emotionally Abused: Not on file  . Physically Abused: Not on file  . Sexually Abused: Not on file     Family History  Problem Relation Age of Onset  . Allergies Brother   . Cancer Brother   . Allergies Sister   . Cancer Sister        Non-hodgkins lymphoma  . Asthma Sister   . Rheum arthritis Sister   . Stroke Mother   . Asthma Brother   . Rheum arthritis Brother   . Cancer Sister        non-hodgkins lymphoma  . Diabetes Brother      Current Outpatient Medications:  .  albuterol (PROVENTIL) (2.5 MG/3ML) 0.083% nebulizer solution, USE ONE VIAL VIA NEBULZIER EVERY FOUR HOURS AS  NEEDED FOR WHEEZING, Disp: 540 mL, Rfl: 1 .  albuterol (VENTOLIN HFA) 108 (90 Base) MCG/ACT inhaler, USE 2 PUFFS EVERY 4 HOURS AS DIRECTED - RESCUE, Disp: 18 g, Rfl: 1 .  amLODipine (NORVASC) 10 MG tablet, TAKE 1 TABLET BY MOUTH DAILY, Disp: 30 tablet, Rfl: 2 .  aspirin 81 MG tablet, Take 81 mg by mouth daily.  , Disp: , Rfl:  .  budesonide-formoterol (SYMBICORT) 80-4.5 MCG/ACT inhaler, INHALE 2 PUFFS TWICE A DAY (Patient taking differently: Inhale 2 puffs into the lungs 2 (two) times daily. ), Disp: 10.2 g, Rfl: 3 .  dabigatran (PRADAXA) 150 MG CAPS, Take 150 mg by mouth every 12 (twelve) hours., Disp: , Rfl:  .  furosemide (LASIX) 40 MG tablet, Take 40 mg by mouth daily., Disp: , Rfl:  .  gabapentin (NEURONTIN) 100 MG capsule, TAKE ONE CAPSULE BY MOUTH TWICE A DAY, Disp: 60 capsule, Rfl: 1 .  levETIRAcetam (KEPPRA) 750 MG tablet, Take 1 tablet (750 mg total) by mouth 2 (two) times daily., Disp: 60 tablet, Rfl: 1 .  losartan (COZAAR) 100 MG tablet, TAKE 1 TABLET BY MOUTH DAILY (Patient taking differently: Take 100 mg by mouth daily. ), Disp: 90 tablet, Rfl: 1 .  metoprolol succinate (TOPROL-XL) 50 MG 24 hr tablet, TAKE 1 TABLET BY MOUTH TWICE A DAY WITH OR IMMEDIATELY FOLLOWING A MEAL, Disp: 60 tablet,  Rfl: 3 .  mupirocin ointment (BACTROBAN) 2 %, Apply to affected area on leg twice a day, Disp: 22 g, Rfl: 0 .  Omega-3 Fatty Acids (FISH OIL) 1000 MG CAPS, Take by mouth., Disp: , Rfl:  .  phenytoin (DILANTIN) 100 MG ER capsule, TAKE 4 CAPSULES BY MOUTH EVERY NIGHT AT BEDTIME, Disp: 120 capsule, Rfl: 0 .  potassium chloride (KLOR-CON) 10 MEQ tablet, Take 1 tablet (10 mEq total) by mouth daily., Disp: 90 tablet, Rfl: 1 .  rosuvastatin (CRESTOR) 20 MG tablet, TAKE 1 TABLET BY MOUTH ONCE A DAY (Patient taking differently: Take 20 mg by mouth daily. ), Disp: 30 tablet, Rfl: 5 .  sertraline (ZOLOFT) 50 MG tablet, TAKE 1 AND 1/2 TABLETS BY MOUTH ONCE A DAY., Disp: 45 tablet, Rfl: 5 .  fluticasone (FLONASE) 50 MCG/ACT nasal spray, USE TWO SPRAYS IN EACH NOSTRIL EACH DAY AS DIRECTED BY PHYSICIAN (Patient not taking: Reported on 01/26/2019), Disp: 16 g, Rfl: 3 .  nystatin-triamcinolone (MYCOLOG II) cream, APPLY 1 APPLICATION TOPICALLY 2 TIMES DAILY TO BOTTOM (Patient not taking: Reported on 01/26/2019), Disp: 30 g, Rfl: 0 .  predniSONE (DELTASONE) 10 MG tablet, Take 6 tablets x 1 day and then decrease by 1/2 tablet per day until down to zero mg. (Patient not taking: Reported on 01/26/2019), Disp: 39 tablet, Rfl: 0   Physical exam:  Vitals:   01/26/19 1350  BP: 136/76  Pulse: 76  Temp: (!) 97.5 F (36.4 C)  TempSrc: Tympanic  SpO2: 90%  Weight: 230 lb (104.3 kg)   Physical Exam Constitutional:      Comments: She is sitting in a wheelchair and appears fatigued  HENT:     Head: Normocephalic and atraumatic.  Eyes:     Pupils: Pupils are equal, round, and reactive to light.  Cardiovascular:     Rate and Rhythm: Normal rate and regular rhythm.     Heart sounds: Normal heart sounds.  Pulmonary:     Effort: Pulmonary effort is normal.     Breath sounds: Normal breath sounds.  Abdominal:     General: Bowel sounds  are normal.     Palpations: Abdomen is soft.  Musculoskeletal:     Cervical back:  Normal range of motion.     Comments: Bilateral chronic lower extremity swelling and skin thickening suggestive of lymphedema  Skin:    General: Skin is warm and dry.  Neurological:     Mental Status: She is alert and oriented to person, place, and time.        CMP Latest Ref Rng & Units 01/19/2019  Glucose 70 - 99 mg/dL 141(H)  BUN 6 - 23 mg/dL 17  Creatinine 0.40 - 1.20 mg/dL 1.02  Sodium 135 - 145 mEq/L 137  Potassium 3.5 - 5.1 mEq/L 3.9  Chloride 96 - 112 mEq/L 109  CO2 19 - 32 mEq/L 23  Calcium 8.4 - 10.5 mg/dL 8.0(L)  Total Protein 6.0 - 8.3 g/dL 7.9  Total Bilirubin 0.2 - 1.2 mg/dL 0.9  Alkaline Phos 39 - 117 U/L 149(H)  AST 0 - 37 U/L 26  ALT 0 - 35 U/L 9   CBC Latest Ref Rng & Units 01/19/2019  WBC 4.0 - 10.5 K/uL 4.5  Hemoglobin 12.0 - 15.0 g/dL 10.0(L)  Hematocrit 36.0 - 46.0 % 31.3(L)  Platelets 150.0 - 400.0 K/uL 109.0(L)    No images are attached to the encounter.  CT Chest High Resolution  Result Date: 01/17/2019 CLINICAL DATA:  Worsening chronic dyspnea. EXAM: CT CHEST WITHOUT CONTRAST TECHNIQUE: Multidetector CT imaging of the chest was performed following the standard protocol without intravenous contrast. High resolution imaging of the lungs, as well as inspiratory and expiratory imaging, was performed. COMPARISON:  04/20/2017 chest radiograph. FINDINGS: Cardiovascular: Moderate cardiomegaly. No significant pericardial effusion/thickening. Three-vessel coronary atherosclerosis. Atherosclerotic nonaneurysmal thoracic aorta. Dilated main pulmonary artery (3.5 cm diameter). Mediastinum/Nodes: No discrete thyroid nodules. Unremarkable esophagus. No pathologically enlarged axillary, mediastinal or hilar lymph nodes, noting limited sensitivity for the detection of hilar adenopathy on this noncontrast study. Lungs/Pleura: No pneumothorax. Small dependent bilateral pleural effusions, left greater than right. No acute consolidative airspace disease or lung masses. Mild  passive atelectasis in the dependent lower lobes bilaterally. Basilar left lower lobe 3 mm solid pulmonary nodule (series 3/image 116). No additional significant pulmonary nodules. Mosaic attenuation throughout both lungs, favored to represent prominent patchy air trapping on the expiration sequence. Patchy peribronchovascular and peripheral ground-glass opacity and reticulation throughout both lungs without significant regions of bronchiectasis or honeycombing. Upper abdomen: Diffusely irregular liver surface. Small volume upper abdominal ascites. Cholelithiasis. Musculoskeletal: No aggressive appearing focal osseous lesions. Mild thoracic spondylosis. IMPRESSION: 1. Mosaic attenuation throughout both lungs, favored to represent prominent patchy air trapping due to small airways disease. Patchy peribronchovascular and peripheral ground-glass opacity and reticulation throughout both lungs without bronchiectasis or honeycombing. This spectrum of findings could indicate chronic hypersensitivity pneumonitis, although a component of cardiogenic pulmonary edema is not excluded given the moderate cardiomegaly. 2. Small dependent bilateral pleural effusions, left greater than right. 3. Solitary 3 mm solid left lung base pulmonary nodule. No follow-up needed if patient is low-risk. Non-contrast chest CT can be considered in 12 months if patient is high-risk. This recommendation follows the consensus statement: Guidelines for Management of Incidental Pulmonary Nodules Detected on CT Images:From the Fleischner Society 2017; published online before print (10.1148/radiol.7893810175). 4. Three-vessel coronary atherosclerosis. 5. Cirrhosis.  Small volume upper abdominal ascites. 6. Cholelithiasis. Aortic Atherosclerosis (ICD10-I70.0). Electronically Signed   By: Ilona Sorrel M.D.   On: 01/17/2019 16:33   NM Myocar Multi W/Spect Tamela Oddi Motion / EF  Result Date:  01/18/2019  Blood pressure demonstrated a normal response to  exercise.  The study is normal.  This is a low risk study.  The left ventricular ejection fraction is hyperdynamic (>65%).     Assessment and plan- Patient is a 85 y.o. female referred for macrocytic anemia and mild thrombocytopenia  Discussed with the patient the results of her recent blood work which shows evidence of macrocytic anemia and her hemoglobin is slowly drifting down from 12-10 over the last 2 years.  She also has a mild thrombocytopenia with a platelet count currently between 90s to 110s.  Given her age this may be secondary to underlying MDS.  However I would like to complete anemia work-up including [place]om[place] test, myeloma panel, serum free light chains, EPO levels, reticulocyte count, LDH, TSH, smear review and a comprehensive ANA panel.  Despite multiple attempts today we were not able to draw blood from the patient  I have therefore asked the patient to return next week to see if he can get this additional blood work done following which I will do a video visit with her.  If the results of the above tests are inconclusive I may consider doing a bone marrow biopsy   Thank you for this kind referral and the opportunity to participate in the care of this  Patient   Visit Diagnosis 1. Macrocytic anemia   2. Thrombocytopenia ([place])     Dr. Randa Evens, MD, MPH [facility] [phone] 01/29/2019

## 2019-01-30 ENCOUNTER — Inpatient Hospital Stay: Payer: Medicare PPO

## 2019-01-30 ENCOUNTER — Other Ambulatory Visit: Payer: Self-pay

## 2019-01-30 DIAGNOSIS — D696 Thrombocytopenia, unspecified: Secondary | ICD-10-CM | POA: Diagnosis not present

## 2019-01-30 DIAGNOSIS — D539 Nutritional anemia, unspecified: Secondary | ICD-10-CM

## 2019-01-30 DIAGNOSIS — I110 Hypertensive heart disease with heart failure: Secondary | ICD-10-CM | POA: Diagnosis not present

## 2019-01-30 DIAGNOSIS — D649 Anemia, unspecified: Secondary | ICD-10-CM | POA: Diagnosis not present

## 2019-01-30 DIAGNOSIS — I2510 Atherosclerotic heart disease of native coronary artery without angina pectoris: Secondary | ICD-10-CM | POA: Diagnosis not present

## 2019-01-30 DIAGNOSIS — I4891 Unspecified atrial fibrillation: Secondary | ICD-10-CM | POA: Diagnosis not present

## 2019-01-30 DIAGNOSIS — E7800 Pure hypercholesterolemia, unspecified: Secondary | ICD-10-CM | POA: Diagnosis not present

## 2019-01-30 DIAGNOSIS — K7460 Unspecified cirrhosis of liver: Secondary | ICD-10-CM | POA: Diagnosis not present

## 2019-01-30 DIAGNOSIS — J849 Interstitial pulmonary disease, unspecified: Secondary | ICD-10-CM | POA: Diagnosis not present

## 2019-01-30 DIAGNOSIS — J90 Pleural effusion, not elsewhere classified: Secondary | ICD-10-CM | POA: Diagnosis not present

## 2019-01-30 LAB — TECHNOLOGIST SMEAR REVIEW: Plt Morphology: DECREASED

## 2019-01-30 LAB — CBC WITH DIFFERENTIAL/PLATELET
Abs Immature Granulocytes: 0.02 10*3/uL (ref 0.00–0.07)
Basophils Absolute: 0 10*3/uL (ref 0.0–0.1)
Basophils Relative: 0 %
Eosinophils Absolute: 0.7 10*3/uL — ABNORMAL HIGH (ref 0.0–0.5)
Eosinophils Relative: 14 %
HCT: 32.7 % — ABNORMAL LOW (ref 36.0–46.0)
Hemoglobin: 9.7 g/dL — ABNORMAL LOW (ref 12.0–15.0)
Immature Granulocytes: 0 %
Lymphocytes Relative: 13 %
Lymphs Abs: 0.6 10*3/uL — ABNORMAL LOW (ref 0.7–4.0)
MCH: 33.1 pg (ref 26.0–34.0)
MCHC: 29.7 g/dL — ABNORMAL LOW (ref 30.0–36.0)
MCV: 111.6 fL — ABNORMAL HIGH (ref 80.0–100.0)
Monocytes Absolute: 0.5 10*3/uL (ref 0.1–1.0)
Monocytes Relative: 10 %
Neutro Abs: 2.9 10*3/uL (ref 1.7–7.7)
Neutrophils Relative %: 63 %
Platelets: 116 10*3/uL — ABNORMAL LOW (ref 150–400)
RBC: 2.93 MIL/uL — ABNORMAL LOW (ref 3.87–5.11)
RDW: 14.6 % (ref 11.5–15.5)
WBC: 4.7 10*3/uL (ref 4.0–10.5)
nRBC: 0 % (ref 0.0–0.2)

## 2019-01-30 LAB — RETICULOCYTES
Immature Retic Fract: 11.2 % (ref 2.3–15.9)
RBC.: 2.91 MIL/uL — ABNORMAL LOW (ref 3.87–5.11)
Retic Count, Absolute: 52.4 10*3/uL (ref 19.0–186.0)
Retic Ct Pct: 1.8 % (ref 0.4–3.1)

## 2019-01-30 LAB — LACTATE DEHYDROGENASE: LDH: 200 U/L — ABNORMAL HIGH (ref 98–192)

## 2019-01-30 LAB — TSH: TSH: 5.047 u[IU]/mL — ABNORMAL HIGH (ref 0.350–4.500)

## 2019-01-30 LAB — DAT, POLYSPECIFIC AHG (ARMC ONLY): Polyspecific AHG test: NEGATIVE

## 2019-01-31 ENCOUNTER — Ambulatory Visit
Admission: RE | Admit: 2019-01-31 | Discharge: 2019-01-31 | Disposition: A | Discharge status: Home / Self Care | Payer: Medicare PPO | Source: Ambulatory Visit | Attending: Internal Medicine | Admitting: Internal Medicine

## 2019-01-31 DIAGNOSIS — D696 Thrombocytopenia, unspecified: Secondary | ICD-10-CM | POA: Diagnosis not present

## 2019-01-31 DIAGNOSIS — R0902 Hypoxemia: Secondary | ICD-10-CM | POA: Diagnosis not present

## 2019-01-31 DIAGNOSIS — J449 Chronic obstructive pulmonary disease, unspecified: Secondary | ICD-10-CM | POA: Diagnosis not present

## 2019-01-31 DIAGNOSIS — I2510 Atherosclerotic heart disease of native coronary artery without angina pectoris: Secondary | ICD-10-CM | POA: Diagnosis not present

## 2019-01-31 DIAGNOSIS — I509 Heart failure, unspecified: Secondary | ICD-10-CM | POA: Diagnosis not present

## 2019-01-31 DIAGNOSIS — E1140 Type 2 diabetes mellitus with diabetic neuropathy, unspecified: Secondary | ICD-10-CM | POA: Diagnosis not present

## 2019-01-31 DIAGNOSIS — I110 Hypertensive heart disease with heart failure: Secondary | ICD-10-CM | POA: Diagnosis not present

## 2019-01-31 DIAGNOSIS — G40909 Epilepsy, unspecified, not intractable, without status epilepticus: Secondary | ICD-10-CM | POA: Diagnosis not present

## 2019-01-31 DIAGNOSIS — J4540 Moderate persistent asthma, uncomplicated: Secondary | ICD-10-CM | POA: Diagnosis not present

## 2019-01-31 DIAGNOSIS — K8020 Calculus of gallbladder without cholecystitis without obstruction: Secondary | ICD-10-CM | POA: Diagnosis not present

## 2019-01-31 DIAGNOSIS — G8929 Other chronic pain: Secondary | ICD-10-CM | POA: Diagnosis not present

## 2019-01-31 DIAGNOSIS — M549 Dorsalgia, unspecified: Secondary | ICD-10-CM | POA: Diagnosis not present

## 2019-01-31 LAB — ERYTHROPOIETIN: Erythropoietin: 38.1 m[IU]/mL — ABNORMAL HIGH (ref 2.6–18.5)

## 2019-01-31 LAB — ANA COMPREHENSIVE PANEL
Anti JO-1: <0.2 AI (ref 0.0–0.9)
Centromere Ab Screen: <0.2 AI (ref 0.0–0.9)
Chromatin Ab SerPl-aCnc: 0.2 AI (ref 0.0–0.9)
ENA SM Ab Ser-aCnc: <0.2 AI (ref 0.0–0.9)
Ribonucleic Protein: <0.2 AI (ref 0.0–0.9)
SSA (Ro) (ENA) Antibody, IgG: <0.2 AI (ref 0.0–0.9)
SSB (La) (ENA) Antibody, IgG: <0.2 AI (ref 0.0–0.9)
Scleroderma (Scl-70) (ENA) Antibody, IgG: <0.2 AI (ref 0.0–0.9)
ds DNA Ab: 2 IU/mL (ref 0–9)

## 2019-01-31 LAB — KAPPA/LAMBDA LIGHT CHAINS
Kappa free light chain: 172.5 mg/L — ABNORMAL HIGH (ref 3.3–19.4)
Kappa, lambda light chain ratio: 1.22 (ref 0.26–1.65)
Lambda free light chains: 141.5 mg/L — ABNORMAL HIGH (ref 5.7–26.3)

## 2019-02-01 NOTE — Telephone Encounter (Signed)
error 

## 2019-02-02 ENCOUNTER — Telehealth: Payer: Self-pay | Admitting: Internal Medicine

## 2019-02-02 DIAGNOSIS — I2510 Atherosclerotic heart disease of native coronary artery without angina pectoris: Secondary | ICD-10-CM | POA: Diagnosis not present

## 2019-02-02 DIAGNOSIS — M549 Dorsalgia, unspecified: Secondary | ICD-10-CM | POA: Diagnosis not present

## 2019-02-02 DIAGNOSIS — I110 Hypertensive heart disease with heart failure: Secondary | ICD-10-CM | POA: Diagnosis not present

## 2019-02-02 DIAGNOSIS — G40909 Epilepsy, unspecified, not intractable, without status epilepticus: Secondary | ICD-10-CM | POA: Diagnosis not present

## 2019-02-02 DIAGNOSIS — J449 Chronic obstructive pulmonary disease, unspecified: Secondary | ICD-10-CM | POA: Diagnosis not present

## 2019-02-02 DIAGNOSIS — J4540 Moderate persistent asthma, uncomplicated: Secondary | ICD-10-CM | POA: Diagnosis not present

## 2019-02-02 DIAGNOSIS — G8929 Other chronic pain: Secondary | ICD-10-CM | POA: Diagnosis not present

## 2019-02-02 DIAGNOSIS — I509 Heart failure, unspecified: Secondary | ICD-10-CM | POA: Diagnosis not present

## 2019-02-02 DIAGNOSIS — E1140 Type 2 diabetes mellitus with diabetic neuropathy, unspecified: Secondary | ICD-10-CM | POA: Diagnosis not present

## 2019-02-02 NOTE — Telephone Encounter (Signed)
Please refer to [place] chart note. Pt came in for evaluation for portable oxygen.  Per note, has been arranged. Per note, she had stress test - Dr Saralyn Pilar.  She had not heard results and I do not see results in care everywhere.  Please inform her to f/u with cardiology regarding this and see if we can get results.  Also, I messaged Dr Annamaria Boots about her pulmonary status and need for oxygen with ambulation.  Please schedule an earlier appt with Dr Annamaria Boots if pt agreeable.

## 2019-02-03 ENCOUNTER — Telehealth: Payer: Self-pay | Admitting: Internal Medicine

## 2019-02-03 ENCOUNTER — Inpatient Hospital Stay (HOSPITAL_BASED_OUTPATIENT_CLINIC_OR_DEPARTMENT_OTHER): Payer: Medicare PPO | Admitting: Oncology

## 2019-02-03 DIAGNOSIS — D696 Thrombocytopenia, unspecified: Secondary | ICD-10-CM | POA: Diagnosis not present

## 2019-02-03 DIAGNOSIS — D539 Nutritional anemia, unspecified: Secondary | ICD-10-CM

## 2019-02-03 LAB — MULTIPLE MYELOMA PANEL, SERUM
Albumin SerPl Elph-Mcnc: 3.1 g/dL (ref 2.9–4.4)
Albumin/Glob SerPl: 0.7 (ref 0.7–1.7)
Alpha 1: 0.3 g/dL (ref 0.0–0.4)
Alpha2 Glob SerPl Elph-Mcnc: 0.4 g/dL (ref 0.4–1.0)
B-Globulin SerPl Elph-Mcnc: 0.9 g/dL (ref 0.7–1.3)
Gamma Glob SerPl Elph-Mcnc: 3.2 g/dL — ABNORMAL HIGH (ref 0.4–1.8)
Globulin, Total: 4.8 g/dL — ABNORMAL HIGH (ref 2.2–3.9)
IgA: 287 mg/dL (ref 64–422)
IgG (Immunoglobin G), Serum: 3410 mg/dL — ABNORMAL HIGH (ref 586–1602)
IgM (Immunoglobulin M), Srm: 164 mg/dL (ref 26–217)
Total Protein ELP: 7.9 g/dL (ref 6.0–8.5)

## 2019-02-03 NOTE — Telephone Encounter (Signed)
-----   Message from Deneise Lever, MD sent at 02/02/2019  8:13 PM EST ----- Regarding: RE: update That is the right thing to do. She has complex problems. We will see her again with PFT in April. - Clint Young ----- Message ----- From: Einar Pheasant, MD Sent: 01/30/2019  12:12 AM EST To: Deneise Lever, MD Subject: update                                         Ms Flitton has had issues recently with her oxygen dropping with ambulation.  Her oxygen level dropped during her walk in our office - 84%.  We are trying to get portable oxygen arranged for her.  I just wanted you to be aware.  Let me know if there is anything more you would like for me to do.  Thank you for taking care of her.    Einar Pheasant

## 2019-02-03 NOTE — Progress Notes (Signed)
Patient stated that she suffers from constipation at times and is currently taking Miralax.

## 2019-02-05 DIAGNOSIS — J9610 Chronic respiratory failure, unspecified whether with hypoxia or hypercapnia: Secondary | ICD-10-CM | POA: Insufficient documentation

## 2019-02-05 NOTE — Assessment & Plan Note (Signed)
Discussed ILD again. Not sure she would accept Esbriet or Ofev. Plan- HRCT, PFT, labs for sed rate, RF, ANA, CBC w diff

## 2019-02-05 NOTE — Assessment & Plan Note (Signed)
Oxygen helps.  Plan- continue O2 2L. [employer] to fit for lighter portable system

## 2019-02-05 NOTE — Assessment & Plan Note (Addendum)
She will continue present meds- refill when needed. Watch for symptom overlap with CHF and with obesity/ deconditioning

## 2019-02-06 NOTE — Progress Notes (Signed)
I connected with Brooke Baker on 02/06/19 at  1:15 PM EST by video enabled telemedicine visit and verified that I am speaking with the correct person using two identifiers.   I discussed the limitations, risks, security and privacy concerns of performing an evaluation and management service by telemedicine and the availability of in-person appointments. I also discussed with the patient that there may be a patient responsible charge related to this service. The patient expressed understanding and agreed to proceed.  Other persons participating in the visit and their role in the encounter:  Patients daughter  Patient's location:  home Provider's location:  work  Risk analyst Complaint: Macrocytic anemia and thrombocytopenia possibly secondary to MDS  History of present illness: patient is a 85 year old female with multiple comorbidities.  At baseline she is able to walk with a walker but overall has limited mobility.  She has had frequent falls in the recent past.  Reports the last time he had.  Her most recent blood work from 01/19/2019 showed a white count of 4.5, H&H of 10/31.3 with an MCV of 108 and a platelet count of 109.  Looking back at her prior counts patient had a normal platelet count of 1.  June 2018 and then starting 2019 she has had platelet count fluctuating between 90-1 30.  Also her hemoglobin is gradually drifted down from 12-10 over the last 2 years.  Recent B12 level was normal at 781.  Iron study showed normal iron levels of 100 and a ferritin of 23.7.  ANA was positive.  Rheumatoid factor was high at 43.  Kidney functions were normal.  She does follow-up with rheumatology and also has history of psoriatic arthritis.   Results of blood work from 01/30/2019 were as follows: CBC showed white count of 4.7 with a normal differential, H&H of 9.7/32 with an MCV of 111 and a platelet count of 116.  LDH was mildly elevated at 200.  Smear review was unremarkable.  Reticulocyte count was low for the  degree of anemia at 1.8%.  TSH was mildly elevated at 5.04.  EPO levels were 38.  Myeloma panel showed polyclonal increase but no monoclonal gammopathy.  Both kappa and lambda light chains were elevated with a normal kappa lambda light chain ratio 1.2.  Coombs test was negative.  ANA comprehensive panel was unremarkable.     Interval history no new changes since last visit.  She does have chronic fatigue bilateral lower extremity edema and exertional shortness of breath   Review of Systems  Constitutional: Positive for malaise/fatigue. Negative for chills, fever and weight loss.  HENT: Negative for congestion, ear discharge and nosebleeds.   Eyes: Negative for blurred vision.  Respiratory: Positive for shortness of breath. Negative for cough, hemoptysis, sputum production and wheezing.   Cardiovascular: Negative for chest pain, palpitations, orthopnea and claudication.  Gastrointestinal: Negative for abdominal pain, blood in stool, constipation, diarrhea, heartburn, melena, nausea and vomiting.  Genitourinary: Negative for dysuria, flank pain, frequency, hematuria and urgency.  Musculoskeletal: Negative for back pain, joint pain and myalgias.  Skin: Negative for rash.  Neurological: Negative for dizziness, tingling, focal weakness, seizures, weakness and headaches.  Endo/Heme/Allergies: Does not bruise/bleed easily.  Psychiatric/Behavioral: Negative for depression and suicidal ideas. The patient does not have insomnia.     Allergies  Allergen Reactions  . Doxycycline Diarrhea and Nausea Only  . Methotrexate Rash    Past Medical History:  Diagnosis Date  . Acute bronchitis   . Allergic rhinitis   .  Arthritis   . CAD (coronary artery disease)    s/p stent LAD 8/03 and stent placement OM1  . Cancer ([place])    skin  . Chronic back pain   . COPD with asthma ([place])   . CVA (cerebral vascular accident) ([place])   . Diabetes mellitus ([place])   . Hypercholesterolemia   . Hypertension   .  Seizure disorder [facility])     Past Surgical History:  Procedure Laterality Date  . BREAST BIOPSY Bilateral    axillas  . CORONARY STENT PLACEMENT  8/03   LAD  . CORONARY STENT PLACEMENT     OM1  2006  . FEMUR FRACTURE SURGERY Right [id number]   Dr. Marry Guan  . LUMBAR SPINE SURGERY     x3  . TOTAL ABDOMINAL HYSTERECTOMY     appendectomy - age 60  . TOTAL HIP ARTHROPLASTY    . TOTAL KNEE ARTHROPLASTY     2005    Social History   Socioeconomic History  . Marital status: Married    Spouse name: Not on file  . Number of children: 3  . Years of education: hs  . Highest education level: Not on file  Occupational History  . Occupation: retired Psychologist, sport and exercise: RETIRED  Tobacco Use  . Smoking status: Never Smoker  . Smokeless tobacco: Never Used  Substance and Sexual Activity  . Alcohol use: No    Alcohol/week: 0.0 standard drinks  . Drug use: No  . Sexual activity: Never  Other Topics Concern  . Not on file  Social History Narrative   lives with husband- Edsel   Social Determinants of Health   Financial Resource Strain:   . Difficulty of Paying Living Expenses: Not on file  Food Insecurity:   . Worried About Charity fundraiser in the Last Year: Not on file  . Ran Out of Food in the Last Year: Not on file  Transportation Needs:   . Lack of Transportation (Medical): Not on file  . Lack of Transportation (Non-Medical): Not on file  Physical Activity:   . Days of Exercise per Week: Not on file  . Minutes of Exercise per Session: Not on file  Stress:   . Feeling of Stress : Not on file  Social Connections:   . Frequency of Communication with Friends and Family: Not on file  . Frequency of Social Gatherings with Friends and Family: Not on file  . Attends Religious Services: Not on file  . Active Member of Clubs or Organizations: Not on file  . Attends Archivist Meetings: Not on file  . Marital Status: Not on file  Intimate Partner Violence:   . Fear  of Current or Ex-Partner: Not on file  . Emotionally Abused: Not on file  . Physically Abused: Not on file  . Sexually Abused: Not on file    Family History  Problem Relation Age of Onset  . Allergies Brother   . Cancer Brother   . Allergies Sister   . Cancer Sister        Non-hodgkins lymphoma  . Asthma Sister   . Rheum arthritis Sister   . Stroke Mother   . Asthma Brother   . Rheum arthritis Brother   . Cancer Sister        non-hodgkins lymphoma  . Diabetes Brother      Current Outpatient Medications:  .  albuterol (PROVENTIL) (2.5 MG/3ML) 0.083% nebulizer solution, USE ONE VIAL VIA NEBULZIER EVERY FOUR  HOURS AS NEEDED FOR WHEEZING, Disp: 540 mL, Rfl: 1 .  albuterol (VENTOLIN HFA) 108 (90 Base) MCG/ACT inhaler, USE 2 PUFFS EVERY 4 HOURS AS DIRECTED - RESCUE, Disp: 18 g, Rfl: 1 .  amLODipine (NORVASC) 10 MG tablet, TAKE 1 TABLET BY MOUTH DAILY, Disp: 30 tablet, Rfl: 2 .  aspirin 81 MG tablet, Take 81 mg by mouth daily.  , Disp: , Rfl:  .  budesonide-formoterol (SYMBICORT) 80-4.5 MCG/ACT inhaler, INHALE 2 PUFFS TWICE A DAY (Patient taking differently: Inhale 2 puffs into the lungs 2 (two) times daily. ), Disp: 10.2 g, Rfl: 3 .  dabigatran (PRADAXA) 150 MG CAPS, Take 150 mg by mouth every 12 (twelve) hours., Disp: , Rfl:  .  fluticasone (FLONASE) 50 MCG/ACT nasal spray, USE TWO SPRAYS IN EACH NOSTRIL EACH DAY AS DIRECTED BY PHYSICIAN, Disp: 16 g, Rfl: 3 .  furosemide (LASIX) 40 MG tablet, Take 40 mg by mouth daily., Disp: , Rfl:  .  gabapentin (NEURONTIN) 100 MG capsule, TAKE ONE CAPSULE BY MOUTH TWICE A DAY, Disp: 60 capsule, Rfl: 1 .  levETIRAcetam (KEPPRA) 750 MG tablet, Take 1 tablet (750 mg total) by mouth 2 (two) times daily., Disp: 60 tablet, Rfl: 1 .  losartan (COZAAR) 100 MG tablet, TAKE 1 TABLET BY MOUTH DAILY (Patient taking differently: Take 100 mg by mouth daily. ), Disp: 90 tablet, Rfl: 1 .  metoprolol succinate (TOPROL-XL) 50 MG 24 hr tablet, TAKE 1 TABLET BY MOUTH  TWICE A DAY WITH OR IMMEDIATELY FOLLOWING A MEAL, Disp: 60 tablet, Rfl: 3 .  mupirocin ointment (BACTROBAN) 2 %, Apply to affected area on leg twice a day, Disp: 22 g, Rfl: 0 .  nystatin-triamcinolone (MYCOLOG II) cream, APPLY 1 APPLICATION TOPICALLY 2 TIMES DAILY TO BOTTOM, Disp: 30 g, Rfl: 0 .  Omega-3 Fatty Acids (FISH OIL) 1000 MG CAPS, Take by mouth., Disp: , Rfl:  .  phenytoin (DILANTIN) 100 MG ER capsule, TAKE 4 CAPSULES BY MOUTH EVERY NIGHT AT BEDTIME, Disp: 120 capsule, Rfl: 0 .  polyethylene glycol (MIRALAX / GLYCOLAX) 17 g packet, Take 17 g by mouth daily., Disp: , Rfl:  .  potassium chloride (KLOR-CON) 10 MEQ tablet, Take 1 tablet (10 mEq total) by mouth daily., Disp: 90 tablet, Rfl: 1 .  predniSONE (DELTASONE) 10 MG tablet, Take 6 tablets x 1 day and then decrease by 1/2 tablet per day until down to zero mg., Disp: 39 tablet, Rfl: 0 .  rosuvastatin (CRESTOR) 20 MG tablet, TAKE 1 TABLET BY MOUTH ONCE A DAY (Patient taking differently: Take 20 mg by mouth daily. ), Disp: 30 tablet, Rfl: 5 .  sertraline (ZOLOFT) 50 MG tablet, TAKE 1 AND 1/2 TABLETS BY MOUTH ONCE A DAY., Disp: 45 tablet, Rfl: 5  US Abdomen Complete  Result Date: 01/31/2019 CLINICAL DATA:  Thrombocytopenia EXAM: ABDOMEN ULTRASOUND COMPLETE COMPARISON:  None. FINDINGS: Gallbladder: Cholelithiasis without gallbladder wall thickening or pericholecystic fluid. Negative sonographic Murphy sign. Largest calculus measures 5.2 mm. Common bile duct: Diameter: 3.3 mm Liver: No focal lesion identified. Within normal limits in parenchymal echogenicity. Portal vein is patent on color Doppler imaging with normal direction of blood flow towards the liver. IVC: No abnormality visualized. Pancreas: Visualized portion unremarkable. Spleen: Size and appearance within normal limits. Right Kidney: Length: 10.2 cm. Echogenicity within normal limits. No solid mass or hydronephrosis visualized. 8 mm anechoic left renal mass in the lower pole consistent  with a cyst. Left Kidney: Length: 10 cm. Echogenicity within normal limits. No mass  or hydronephrosis visualized. Abdominal aorta: No aneurysm visualized. Other findings: None. IMPRESSION: 1. No cholelithiasis without sonographic evidence of acute cholecystitis. 2. Normal spleen. Electronically Signed   By: Kathreen Devoid   On: 01/31/2019 15:31   CT Chest High Resolution  Result Date: 01/17/2019 CLINICAL DATA:  Worsening chronic dyspnea. EXAM: CT CHEST WITHOUT CONTRAST TECHNIQUE: Multidetector CT imaging of the chest was performed following the standard protocol without intravenous contrast. High resolution imaging of the lungs, as well as inspiratory and expiratory imaging, was performed. COMPARISON:  04/20/2017 chest radiograph. FINDINGS: Cardiovascular: Moderate cardiomegaly. No significant pericardial effusion/thickening. Three-vessel coronary atherosclerosis. Atherosclerotic nonaneurysmal thoracic aorta. Dilated main pulmonary artery (3.5 cm diameter). Mediastinum/Nodes: No discrete thyroid nodules. Unremarkable esophagus. No pathologically enlarged axillary, mediastinal or hilar lymph nodes, noting limited sensitivity for the detection of hilar adenopathy on this noncontrast study. Lungs/Pleura: No pneumothorax. Small dependent bilateral pleural effusions, left greater than right. No acute consolidative airspace disease or lung masses. Mild passive atelectasis in the dependent lower lobes bilaterally. Basilar left lower lobe 3 mm solid pulmonary nodule (series 3/image 116). No additional significant pulmonary nodules. Mosaic attenuation throughout both lungs, favored to represent prominent patchy air trapping on the expiration sequence. Patchy peribronchovascular and peripheral ground-glass opacity and reticulation throughout both lungs without significant regions of bronchiectasis or honeycombing. Upper abdomen: Diffusely irregular liver surface. Small volume upper abdominal ascites. Cholelithiasis.  Musculoskeletal: No aggressive appearing focal osseous lesions. Mild thoracic spondylosis. IMPRESSION: 1. Mosaic attenuation throughout both lungs, favored to represent prominent patchy air trapping due to small airways disease. Patchy peribronchovascular and peripheral ground-glass opacity and reticulation throughout both lungs without bronchiectasis or honeycombing. This spectrum of findings could indicate chronic hypersensitivity pneumonitis, although a component of cardiogenic pulmonary edema is not excluded given the moderate cardiomegaly. 2. Small dependent bilateral pleural effusions, left greater than right. 3. Solitary 3 mm solid left lung base pulmonary nodule. No follow-up needed if patient is low-risk. Non-contrast chest CT can be considered in 12 months if patient is high-risk. This recommendation follows the consensus statement: Guidelines for Management of Incidental Pulmonary Nodules Detected on CT Images:From the Fleischner Society 2017; published online before print (10.1148/radiol.6433295188). 4. Three-vessel coronary atherosclerosis. 5. Cirrhosis.  Small volume upper abdominal ascites. 6. Cholelithiasis. Aortic Atherosclerosis (ICD10-I70.0). Electronically Signed   By: Ilona Sorrel M.D.   On: 01/17/2019 16:33   NM Myocar Multi W/Spect W/Wall Motion / EF  Result Date: 01/18/2019  Blood pressure demonstrated a normal response to exercise.  The study is normal.  This is a low risk study.  The left ventricular ejection fraction is hyperdynamic (>65%).     No images are attached to the encounter.   CMP Latest Ref Rng & Units 01/19/2019  Glucose 70 - 99 mg/dL 141(H)  BUN 6 - 23 mg/dL 17  Creatinine 0.40 - 1.20 mg/dL 1.02  Sodium 135 - 145 mEq/L 137  Potassium 3.5 - 5.1 mEq/L 3.9  Chloride 96 - 112 mEq/L 109  CO2 19 - 32 mEq/L 23  Calcium 8.4 - 10.5 mg/dL 8.0(L)  Total Protein 6.0 - 8.3 g/dL 7.9  Total Bilirubin 0.2 - 1.2 mg/dL 0.9  Alkaline Phos 39 - 117 U/L 149(H)  AST 0 - 37  U/L 26  ALT 0 - 35 U/L 9   CBC Latest Ref Rng & Units 01/30/2019  WBC 4.0 - 10.5 K/uL 4.7  Hemoglobin 12.0 - 15.0 g/dL 9.7(L)  Hematocrit 36.0 - 46.0 % 32.7(L)  Platelets 150 - 400 K/uL 116(L)  Observation/objective: Patient appears fatigued and in mild distress from shortness of breath which is chronic and is on home oxygen  Assessment and plan: Patient is a 85 year old female referred for macrocytic anemia and thrombocytopenia.  I  discussed the results of the blood work with the patient in detail. Her iron studies B12 folate, myeloma panel is unremarkable.  Serum free light chain ratio is normal.  No evidence of hemolysis.  Reticulocyte count is low suggestive of hypoproliferative anemia.  Peripheral smear review does not reveal any abnormal cells or blasts.  Occasional large platelets noted.  Given the macrocytosis as well as thrombocytopenia and her age I suspect we could be dealing with MDS.  At this time her anemia is moderate and thrombocytopenia is mild.  Bone marrow biopsy would not change management just yet.  I would like to repeat her labs in about 2 months time and if her hemoglobin starts drifting down further I would do a bone marrow biopsy to confirm diagnosis and give her a trial of EPO at that time.  We typically initiate a total to keep hemoglobin closer to 10 and given that her hemoglobin is 9.7 today I would not do that just yet.  Patient also noted to have mildly elevated TSH which I will get repeat in 2 months time  Follow-up instructions: Labs followed by video visit in 2 months  I discussed the assessment and treatment plan with the patient. The patient was provided an opportunity to ask questions and all were answered. The patient agreed with the plan and demonstrated an understanding of the instructions.   The patient was advised to call back or seek an in-person evaluation if the symptoms worsen or if the condition fails to improve as anticipated.    Visit  Diagnosis: 1. Thrombocytopenia ([place])   2. Macrocytic anemia     Dr. Randa Evens, MD, MPH [facility] [place] at [facility] Tel- [phone] 02/06/2019 9:16 AM

## 2019-02-08 DIAGNOSIS — G40909 Epilepsy, unspecified, not intractable, without status epilepticus: Secondary | ICD-10-CM | POA: Diagnosis not present

## 2019-02-08 DIAGNOSIS — M549 Dorsalgia, unspecified: Secondary | ICD-10-CM | POA: Diagnosis not present

## 2019-02-08 DIAGNOSIS — G8929 Other chronic pain: Secondary | ICD-10-CM | POA: Diagnosis not present

## 2019-02-08 DIAGNOSIS — I509 Heart failure, unspecified: Secondary | ICD-10-CM | POA: Diagnosis not present

## 2019-02-08 DIAGNOSIS — I2510 Atherosclerotic heart disease of native coronary artery without angina pectoris: Secondary | ICD-10-CM | POA: Diagnosis not present

## 2019-02-08 DIAGNOSIS — J4540 Moderate persistent asthma, uncomplicated: Secondary | ICD-10-CM | POA: Diagnosis not present

## 2019-02-08 DIAGNOSIS — I110 Hypertensive heart disease with heart failure: Secondary | ICD-10-CM | POA: Diagnosis not present

## 2019-02-08 DIAGNOSIS — J449 Chronic obstructive pulmonary disease, unspecified: Secondary | ICD-10-CM | POA: Diagnosis not present

## 2019-02-08 DIAGNOSIS — E1140 Type 2 diabetes mellitus with diabetic neuropathy, unspecified: Secondary | ICD-10-CM | POA: Diagnosis not present

## 2019-02-10 NOTE — Telephone Encounter (Signed)
Spoke with patient. She is going to call cardiology to set up f/u appt. I will request records from stress test. She is agreeable to see Dr Annamaria Boots sooner than April. I will reach out to them and move appt up.

## 2019-02-11 DIAGNOSIS — J4540 Moderate persistent asthma, uncomplicated: Secondary | ICD-10-CM | POA: Diagnosis not present

## 2019-02-11 DIAGNOSIS — I509 Heart failure, unspecified: Secondary | ICD-10-CM | POA: Diagnosis not present

## 2019-02-11 DIAGNOSIS — G8929 Other chronic pain: Secondary | ICD-10-CM | POA: Diagnosis not present

## 2019-02-11 DIAGNOSIS — J449 Chronic obstructive pulmonary disease, unspecified: Secondary | ICD-10-CM | POA: Diagnosis not present

## 2019-02-11 DIAGNOSIS — I110 Hypertensive heart disease with heart failure: Secondary | ICD-10-CM | POA: Diagnosis not present

## 2019-02-11 DIAGNOSIS — G40909 Epilepsy, unspecified, not intractable, without status epilepticus: Secondary | ICD-10-CM | POA: Diagnosis not present

## 2019-02-11 DIAGNOSIS — E1140 Type 2 diabetes mellitus with diabetic neuropathy, unspecified: Secondary | ICD-10-CM | POA: Diagnosis not present

## 2019-02-11 DIAGNOSIS — I2510 Atherosclerotic heart disease of native coronary artery without angina pectoris: Secondary | ICD-10-CM | POA: Diagnosis not present

## 2019-02-11 DIAGNOSIS — M549 Dorsalgia, unspecified: Secondary | ICD-10-CM | POA: Diagnosis not present

## 2019-02-13 DIAGNOSIS — I10 Essential (primary) hypertension: Secondary | ICD-10-CM | POA: Diagnosis not present

## 2019-02-13 DIAGNOSIS — R0602 Shortness of breath: Secondary | ICD-10-CM | POA: Diagnosis not present

## 2019-02-13 DIAGNOSIS — E785 Hyperlipidemia, unspecified: Secondary | ICD-10-CM | POA: Diagnosis not present

## 2019-02-13 DIAGNOSIS — J449 Chronic obstructive pulmonary disease, unspecified: Secondary | ICD-10-CM | POA: Diagnosis not present

## 2019-02-13 DIAGNOSIS — Z8673 Personal history of transient ischemic attack (TIA), and cerebral infarction without residual deficits: Secondary | ICD-10-CM | POA: Diagnosis not present

## 2019-02-13 DIAGNOSIS — I639 Cerebral infarction, unspecified: Secondary | ICD-10-CM | POA: Diagnosis not present

## 2019-02-13 DIAGNOSIS — Z9889 Other specified postprocedural states: Secondary | ICD-10-CM | POA: Diagnosis not present

## 2019-02-13 DIAGNOSIS — I2510 Atherosclerotic heart disease of native coronary artery without angina pectoris: Secondary | ICD-10-CM | POA: Diagnosis not present

## 2019-02-13 DIAGNOSIS — I4819 Other persistent atrial fibrillation: Secondary | ICD-10-CM | POA: Diagnosis not present

## 2019-02-14 ENCOUNTER — Ambulatory Visit: Payer: Medicare PPO | Admitting: Neurology

## 2019-02-14 NOTE — Progress Notes (Deleted)
PATIENT: Brooke Baker DOB: 07-Aug-1934  REASON FOR VISIT: follow up HISTORY FROM: patient  HISTORY OF PRESENT ILLNESS: Today 02/14/19  Brooke Baker is an 85 year old female with history of seizures.  Her last seizure occurred in 2004.  The combination of Dilantin and Keppra has worked well for her.  HISTORY 03/25/2017 CM HISTORY OF PRESENT ILLNESS:Brooke Baker is a 85 year old right-handed white female with a history of seizures. The patient has not had any seizures in a number of years,  Last  2004. The patient indicates that since she went on a combination of Dilantin and Keppra, the seizures have essentially disappeared. The patient fractured her femur last year and still requires a walker/cane for ambulation. She has not had any further falls. She is on a combination of calcium and vitamin D supplementation currently. She is tolerating the medications fairly well, and she recently had blood work done in [place],  [place] and heapatic function through her primary care physician, but a dilantin level was not included.  She is no longer driving.  She remains independent in activities of daily living.  She returns for an evaluation at this time. She needs refills on her medications.    REVIEW OF SYSTEMS: Out of a complete 14 system review of symptoms, the patient complains only of the following symptoms, and all other reviewed systems are negative.  ALLERGIES: Allergies  Allergen Reactions  . Doxycycline Diarrhea and Nausea Only  . Methotrexate Rash    HOME MEDICATIONS: Outpatient Medications Prior to Visit  Medication Sig Dispense Refill  . albuterol (PROVENTIL) (2.5 MG/3ML) 0.083% nebulizer solution USE ONE VIAL VIA NEBULZIER EVERY FOUR HOURS AS NEEDED FOR WHEEZING 540 mL 1  . albuterol (VENTOLIN HFA) 108 (90 Base) MCG/ACT inhaler USE 2 PUFFS EVERY 4 HOURS AS DIRECTED - RESCUE 18 g 1  . amLODipine (NORVASC) 10 MG tablet TAKE 1 TABLET BY MOUTH DAILY 30 tablet 2  . aspirin  81 MG tablet Take 81 mg by mouth daily.      . budesonide-formoterol (SYMBICORT) 80-4.5 MCG/ACT inhaler INHALE 2 PUFFS TWICE A DAY (Patient taking differently: Inhale 2 puffs into the lungs 2 (two) times daily. ) 10.2 g 3  . dabigatran (PRADAXA) 150 MG CAPS Take 150 mg by mouth every 12 (twelve) hours.    . fluticasone (FLONASE) 50 MCG/ACT nasal spray USE TWO SPRAYS IN EACH NOSTRIL EACH DAY AS DIRECTED BY PHYSICIAN 16 g 3  . furosemide (LASIX) 40 MG tablet Take 40 mg by mouth daily.    [name] gabapentin (NEURONTIN) 100 MG capsule TAKE ONE CAPSULE BY MOUTH TWICE A DAY 60 capsule 1  . levETIRAcetam (KEPPRA) 750 MG tablet Take 1 tablet (750 mg total) by mouth 2 (two) times daily. 60 tablet 1  . losartan (COZAAR) 100 MG tablet TAKE 1 TABLET BY MOUTH DAILY (Patient taking differently: Take 100 mg by mouth daily. ) 90 tablet 1  . metoprolol succinate (TOPROL-XL) 50 MG 24 hr tablet TAKE 1 TABLET BY MOUTH TWICE A DAY WITH OR IMMEDIATELY FOLLOWING A MEAL 60 tablet 3  . mupirocin ointment (BACTROBAN) 2 % Apply to affected area on leg twice a day 22 g 0  . nystatin-triamcinolone (MYCOLOG II) cream APPLY 1 APPLICATION TOPICALLY 2 TIMES DAILY TO BOTTOM 30 g 0  . Omega-3 Fatty Acids (FISH OIL) 1000 MG CAPS Take by mouth.    . phenytoin (DILANTIN) 100 MG ER capsule TAKE 4 CAPSULES BY MOUTH EVERY NIGHT AT BEDTIME 120 capsule 0  .  polyethylene glycol (MIRALAX / GLYCOLAX) 17 g packet Take 17 g by mouth daily.    . potassium chloride (KLOR-CON) 10 MEQ tablet Take 1 tablet (10 mEq total) by mouth daily. 90 tablet 1  . predniSONE (DELTASONE) 10 MG tablet Take 6 tablets x 1 day and then decrease by 1/2 tablet per day until down to zero mg. 39 tablet 0  . rosuvastatin (CRESTOR) 20 MG tablet TAKE 1 TABLET BY MOUTH ONCE A DAY (Patient taking differently: Take 20 mg by mouth daily. ) 30 tablet 5  . sertraline (ZOLOFT) 50 MG tablet TAKE 1 AND 1/2 TABLETS BY MOUTH ONCE A DAY. 45 tablet 5   No facility-administered medications  prior to visit.    PAST MEDICAL HISTORY: Past Medical History:  Diagnosis Date  . Acute bronchitis   . Allergic rhinitis   . Arthritis   . CAD (coronary artery disease)    s/p stent LAD 8/03 and stent placement OM1  . Cancer ([place])    skin  . Chronic back pain   . COPD with asthma ([place])   . CVA (cerebral vascular accident) ([place])   . Diabetes mellitus ([place])   . Hypercholesterolemia   . Hypertension   . Seizure disorder ([place])     PAST SURGICAL HISTORY: Past Surgical History:  Procedure Laterality Date  . BREAST BIOPSY Bilateral    axillas  . CORONARY STENT PLACEMENT  8/03   LAD  . CORONARY STENT PLACEMENT     OM1  2006  . FEMUR FRACTURE SURGERY Right [id number]   Dr. Marry Guan  . LUMBAR SPINE SURGERY     x3  . TOTAL ABDOMINAL HYSTERECTOMY     appendectomy - age 54  . TOTAL HIP ARTHROPLASTY    . TOTAL KNEE ARTHROPLASTY     2005    FAMILY HISTORY: Family History  Problem Relation Age of Onset  . Allergies Brother   . Cancer Brother   . Allergies Sister   . Cancer Sister        Non-hodgkins lymphoma  . Asthma Sister   . Rheum arthritis Sister   . Stroke Mother   . Asthma Brother   . Rheum arthritis Brother   . Cancer Sister        non-hodgkins lymphoma  . Diabetes Brother     SOCIAL HISTORY: Social History   Socioeconomic History  . Marital status: Married    Spouse name: Not on file  . Number of children: 3  . Years of education: hs  . Highest education level: Not on file  Occupational History  . Occupation: retired Psychologist, sport and exercise: RETIRED  Tobacco Use  . Smoking status: Never Smoker  . Smokeless tobacco: Never Used  Substance and Sexual Activity  . Alcohol use: No    Alcohol/week: 0.0 standard drinks  . Drug use: No  . Sexual activity: Never  Other Topics Concern  . Not on file  Social History Narrative   lives with husband- Edsel   Social Determinants of Health   Financial Resource Strain:   . Difficulty of Paying Living Expenses:  Not on file  Food Insecurity:   . Worried About Charity fundraiser in the Last Year: Not on file  . Ran Out of Food in the Last Year: Not on file  Transportation Needs:   . Lack of Transportation (Medical): Not on file  . Lack of Transportation (Non-Medical): Not on file  Physical Activity:   . Days of Exercise  per Week: Not on file  . Minutes of Exercise per Session: Not on file  Stress:   . Feeling of Stress : Not on file  Social Connections:   . Frequency of Communication with Friends and Family: Not on file  . Frequency of Social Gatherings with Friends and Family: Not on file  . Attends Religious Services: Not on file  . Active Member of Clubs or Organizations: Not on file  . Attends Archivist Meetings: Not on file  . Marital Status: Not on file  Intimate Partner Violence:   . Fear of Current or Ex-Partner: Not on file  . Emotionally Abused: Not on file  . Physically Abused: Not on file  . Sexually Abused: Not on file      PHYSICAL EXAM  There were no vitals filed for this visit. There is no height or weight on file to calculate BMI.  Generalized: Well developed, in no acute distress   Neurological examination  Mentation: Alert oriented to time, place, history taking. Follows all commands speech and language fluent Cranial nerve II-XII: Pupils were equal round reactive to light. Extraocular movements were full, visual field were full on confrontational test. Facial sensation and strength were normal. Uvula tongue midline. Head turning and shoulder shrug  were normal and symmetric. Motor: The motor testing reveals 5 over 5 strength of all 4 extremities. Good symmetric motor tone is noted throughout.  Sensory: Sensory testing is intact to soft touch on all 4 extremities. No evidence of extinction is noted.  Coordination: Cerebellar testing reveals good finger-nose-finger and heel-to-shin bilaterally.  Gait and station: Gait is normal. Tandem gait is normal.  Romberg is negative. No drift is seen.  Reflexes: Deep tendon reflexes are symmetric and normal bilaterally.   DIAGNOSTIC DATA (LABS, IMAGING, TESTING) - I reviewed patient records, labs, notes, testing and imaging myself where available.  Lab Results  Component Value Date   WBC 4.7 01/30/2019   HGB 9.7 (L) 01/30/2019   HCT 32.7 (L) 01/30/2019   MCV 111.6 (H) 01/30/2019   PLT 116 (L) 01/30/2019      Component Value Date/Time   NA 137 01/19/2019 1431   NA 132 (L) 05/17/2012 0503   K 3.9 01/19/2019 1431   K 3.4 (L) 05/17/2012 0503   CL 109 01/19/2019 1431   CL 101 05/17/2012 0503   CO2 23 01/19/2019 1431   CO2 25 05/17/2012 0503   GLUCOSE 141 (H) 01/19/2019 1431   GLUCOSE 119 (H) 05/17/2012 0503   BUN 17 01/19/2019 1431   BUN 14 05/17/2012 0503   CREATININE 1.02 01/19/2019 1431   CREATININE 1.15 (H) 05/24/2017 1353   CALCIUM 8.0 (L) 01/19/2019 1431   CALCIUM 8.5 05/17/2012 0503   PROT 7.9 01/19/2019 1431   ALBUMIN 2.9 (L) 01/19/2019 1431   AST 26 01/19/2019 1431   ALT 9 01/19/2019 1431   ALKPHOS 149 (H) 01/19/2019 1431   BILITOT 0.9 01/19/2019 1431   GFRNONAA 44 (L) 09/08/2018 1854   GFRNONAA >60 05/17/2012 0503   GFRAA 51 (L) 09/08/2018 1854   GFRAA >60 05/17/2012 0503   Lab Results  Component Value Date   CHOL 97 01/19/2019   HDL 31.80 (L) 01/19/2019   LDLCALC 50 01/19/2019   TRIG 77.0 01/19/2019   CHOLHDL 3 01/19/2019   Lab Results  Component Value Date   HGBA1C 4.8 01/19/2019   Lab Results  Component Value Date   [id number] 01/19/2019   Lab Results  Component Value Date  TSH 5.047 (H) 01/30/2019      ASSESSMENT AND PLAN 84 y.o. year old female  has a past medical history of Acute bronchitis, Allergic rhinitis, Arthritis, CAD (coronary artery disease), Cancer ([place]), Chronic back pain, COPD with asthma ([place]), CVA (cerebral vascular accident) ([place]), Diabetes mellitus ([place]), Hypercholesterolemia, Hypertension, and Seizure disorder ([place]). here with  ***   I spent 15 minutes with the patient. 50% of this time was spent   Butler Denmark, [place], DNP 02/14/2019, 6:06 AM [facility] [address], [place], [place] [address] [phone]

## 2019-02-14 NOTE — Telephone Encounter (Signed)
Reviewed notes in chart. Per Dr Janee Morn note, pt does not need to be moved up. She has her PFT scheduled and a follow up with him on 05/01/19. He recommended her keep her appt with him and for PFT in April. Just wanted to confirm with you prior to moving appt up.

## 2019-02-15 ENCOUNTER — Encounter: Payer: Self-pay | Admitting: Neurology

## 2019-02-15 NOTE — Telephone Encounter (Signed)
Confirmed that patient is doing ok. Advised that as long as she is feeling ok then she can keep her appt with Dr Annamaria Boots in April. If she feels she needs sooner appt she will let me know

## 2019-02-15 NOTE — Telephone Encounter (Signed)
Please confirm how pt is doing now.  If symptoms improved, stable - ok to keep scheduled appt.  Let me know if needs something more

## 2019-02-17 DIAGNOSIS — G8929 Other chronic pain: Secondary | ICD-10-CM | POA: Diagnosis not present

## 2019-02-17 DIAGNOSIS — I2510 Atherosclerotic heart disease of native coronary artery without angina pectoris: Secondary | ICD-10-CM | POA: Diagnosis not present

## 2019-02-17 DIAGNOSIS — I509 Heart failure, unspecified: Secondary | ICD-10-CM | POA: Diagnosis not present

## 2019-02-17 DIAGNOSIS — J449 Chronic obstructive pulmonary disease, unspecified: Secondary | ICD-10-CM | POA: Diagnosis not present

## 2019-02-17 DIAGNOSIS — M549 Dorsalgia, unspecified: Secondary | ICD-10-CM | POA: Diagnosis not present

## 2019-02-17 DIAGNOSIS — E1140 Type 2 diabetes mellitus with diabetic neuropathy, unspecified: Secondary | ICD-10-CM | POA: Diagnosis not present

## 2019-02-17 DIAGNOSIS — G40909 Epilepsy, unspecified, not intractable, without status epilepticus: Secondary | ICD-10-CM | POA: Diagnosis not present

## 2019-02-17 DIAGNOSIS — J4540 Moderate persistent asthma, uncomplicated: Secondary | ICD-10-CM | POA: Diagnosis not present

## 2019-02-17 DIAGNOSIS — I110 Hypertensive heart disease with heart failure: Secondary | ICD-10-CM | POA: Diagnosis not present

## 2019-02-20 ENCOUNTER — Other Ambulatory Visit: Payer: Self-pay | Admitting: Neurology

## 2019-02-22 DIAGNOSIS — J4540 Moderate persistent asthma, uncomplicated: Secondary | ICD-10-CM | POA: Diagnosis not present

## 2019-02-22 DIAGNOSIS — I2510 Atherosclerotic heart disease of native coronary artery without angina pectoris: Secondary | ICD-10-CM | POA: Diagnosis not present

## 2019-02-22 DIAGNOSIS — M549 Dorsalgia, unspecified: Secondary | ICD-10-CM | POA: Diagnosis not present

## 2019-02-22 DIAGNOSIS — I110 Hypertensive heart disease with heart failure: Secondary | ICD-10-CM | POA: Diagnosis not present

## 2019-02-22 DIAGNOSIS — G40909 Epilepsy, unspecified, not intractable, without status epilepticus: Secondary | ICD-10-CM | POA: Diagnosis not present

## 2019-02-22 DIAGNOSIS — G8929 Other chronic pain: Secondary | ICD-10-CM | POA: Diagnosis not present

## 2019-02-22 DIAGNOSIS — E1140 Type 2 diabetes mellitus with diabetic neuropathy, unspecified: Secondary | ICD-10-CM | POA: Diagnosis not present

## 2019-02-22 DIAGNOSIS — J449 Chronic obstructive pulmonary disease, unspecified: Secondary | ICD-10-CM | POA: Diagnosis not present

## 2019-02-22 DIAGNOSIS — I509 Heart failure, unspecified: Secondary | ICD-10-CM | POA: Diagnosis not present

## 2019-03-02 DIAGNOSIS — G8929 Other chronic pain: Secondary | ICD-10-CM | POA: Diagnosis not present

## 2019-03-02 DIAGNOSIS — I2510 Atherosclerotic heart disease of native coronary artery without angina pectoris: Secondary | ICD-10-CM | POA: Diagnosis not present

## 2019-03-02 DIAGNOSIS — M549 Dorsalgia, unspecified: Secondary | ICD-10-CM | POA: Diagnosis not present

## 2019-03-02 DIAGNOSIS — E1140 Type 2 diabetes mellitus with diabetic neuropathy, unspecified: Secondary | ICD-10-CM | POA: Diagnosis not present

## 2019-03-02 DIAGNOSIS — I509 Heart failure, unspecified: Secondary | ICD-10-CM | POA: Diagnosis not present

## 2019-03-02 DIAGNOSIS — I110 Hypertensive heart disease with heart failure: Secondary | ICD-10-CM | POA: Diagnosis not present

## 2019-03-02 DIAGNOSIS — G40909 Epilepsy, unspecified, not intractable, without status epilepticus: Secondary | ICD-10-CM | POA: Diagnosis not present

## 2019-03-02 DIAGNOSIS — J449 Chronic obstructive pulmonary disease, unspecified: Secondary | ICD-10-CM | POA: Diagnosis not present

## 2019-03-02 DIAGNOSIS — J4540 Moderate persistent asthma, uncomplicated: Secondary | ICD-10-CM | POA: Diagnosis not present

## 2019-03-03 ENCOUNTER — Telehealth: Payer: Self-pay | Admitting: Neurology

## 2019-03-03 ENCOUNTER — Other Ambulatory Visit: Payer: Self-pay | Admitting: Neurology

## 2019-03-03 DIAGNOSIS — J449 Chronic obstructive pulmonary disease, unspecified: Secondary | ICD-10-CM | POA: Diagnosis not present

## 2019-03-03 DIAGNOSIS — R0902 Hypoxemia: Secondary | ICD-10-CM | POA: Diagnosis not present

## 2019-03-03 MED ORDER — PHENYTOIN SODIUM EXTENDED 100 MG PO CAPS: 400.0000 mg | ORAL_CAPSULE | Freq: Every day | ORAL | 0 refills | Status: AC

## 2019-03-03 MED ORDER — LEVETIRACETAM 750 MG PO TABS: 750.0000 mg | ORAL_TABLET | Freq: Two times a day (BID) | ORAL | 0 refills | Status: DC

## 2019-03-03 NOTE — Telephone Encounter (Signed)
Patient's daughter called to refill meds for patient. She no-showed last appointment 09/26/2018. would you address when back in the office what needs to be done, has not been seen in the office since 03/2017. I tried to call daughter back, went straight to voice mail which is full. I will refill for a few months, Thanks.

## 2019-03-05 DIAGNOSIS — J449 Chronic obstructive pulmonary disease, unspecified: Secondary | ICD-10-CM | POA: Diagnosis not present

## 2019-03-09 DIAGNOSIS — I509 Heart failure, unspecified: Secondary | ICD-10-CM | POA: Diagnosis not present

## 2019-03-09 DIAGNOSIS — I110 Hypertensive heart disease with heart failure: Secondary | ICD-10-CM | POA: Diagnosis not present

## 2019-03-09 DIAGNOSIS — M549 Dorsalgia, unspecified: Secondary | ICD-10-CM | POA: Diagnosis not present

## 2019-03-09 DIAGNOSIS — E1140 Type 2 diabetes mellitus with diabetic neuropathy, unspecified: Secondary | ICD-10-CM | POA: Diagnosis not present

## 2019-03-09 DIAGNOSIS — I2510 Atherosclerotic heart disease of native coronary artery without angina pectoris: Secondary | ICD-10-CM | POA: Diagnosis not present

## 2019-03-09 DIAGNOSIS — G8929 Other chronic pain: Secondary | ICD-10-CM | POA: Diagnosis not present

## 2019-03-09 DIAGNOSIS — J449 Chronic obstructive pulmonary disease, unspecified: Secondary | ICD-10-CM | POA: Diagnosis not present

## 2019-03-09 DIAGNOSIS — G40909 Epilepsy, unspecified, not intractable, without status epilepticus: Secondary | ICD-10-CM | POA: Diagnosis not present

## 2019-03-09 DIAGNOSIS — J4540 Moderate persistent asthma, uncomplicated: Secondary | ICD-10-CM | POA: Diagnosis not present

## 2019-03-10 ENCOUNTER — Other Ambulatory Visit: Payer: Self-pay | Admitting: Internal Medicine

## 2019-03-13 NOTE — Telephone Encounter (Signed)
IF patients daughter calls back pt needs an appt with any NP who has the first available. Pt was last seen March 2019. PT needs to be seen asap to continue refills. She was only given a 3 month supply in February  2021.

## 2019-03-18 ENCOUNTER — Inpatient Hospital Stay
Admission: EM | Admit: 2019-03-18 | Discharge: 2019-03-29 | DRG: 871 | Disposition: A | Discharge status: Skilled Nursing Facility | Payer: Medicare PPO | Attending: Internal Medicine | Admitting: Internal Medicine

## 2019-03-18 ENCOUNTER — Other Ambulatory Visit: Payer: Self-pay

## 2019-03-18 ENCOUNTER — Emergency Department: Payer: Medicare PPO

## 2019-03-18 DIAGNOSIS — R079 Chest pain, unspecified: Secondary | ICD-10-CM | POA: Diagnosis not present

## 2019-03-18 DIAGNOSIS — D696 Thrombocytopenia, unspecified: Secondary | ICD-10-CM | POA: Diagnosis present

## 2019-03-18 DIAGNOSIS — S0990XA Unspecified injury of head, initial encounter: Secondary | ICD-10-CM | POA: Diagnosis not present

## 2019-03-18 DIAGNOSIS — R062 Wheezing: Secondary | ICD-10-CM

## 2019-03-18 DIAGNOSIS — Z955 Presence of coronary angioplasty implant and graft: Secondary | ICD-10-CM | POA: Diagnosis not present

## 2019-03-18 DIAGNOSIS — G8929 Other chronic pain: Secondary | ICD-10-CM | POA: Diagnosis present

## 2019-03-18 DIAGNOSIS — R000 Tachycardia, unspecified: Secondary | ICD-10-CM | POA: Diagnosis not present

## 2019-03-18 DIAGNOSIS — A419 Sepsis, unspecified organism: Secondary | ICD-10-CM

## 2019-03-18 DIAGNOSIS — R0602 Shortness of breath: Secondary | ICD-10-CM | POA: Diagnosis not present

## 2019-03-18 DIAGNOSIS — L4050 Arthropathic psoriasis, unspecified: Secondary | ICD-10-CM | POA: Diagnosis present

## 2019-03-18 DIAGNOSIS — Z95828 Presence of other vascular implants and grafts: Secondary | ICD-10-CM

## 2019-03-18 DIAGNOSIS — R0600 Dyspnea, unspecified: Secondary | ICD-10-CM | POA: Diagnosis not present

## 2019-03-18 DIAGNOSIS — R05 Cough: Secondary | ICD-10-CM | POA: Diagnosis not present

## 2019-03-18 DIAGNOSIS — Z8673 Personal history of transient ischemic attack (TIA), and cerebral infarction without residual deficits: Secondary | ICD-10-CM

## 2019-03-18 DIAGNOSIS — W19XXXA Unspecified fall, initial encounter: Secondary | ICD-10-CM | POA: Diagnosis not present

## 2019-03-18 DIAGNOSIS — J440 Chronic obstructive pulmonary disease with acute lower respiratory infection: Secondary | ICD-10-CM | POA: Diagnosis present

## 2019-03-18 DIAGNOSIS — Z20822 Contact with and (suspected) exposure to covid-19: Secondary | ICD-10-CM | POA: Diagnosis present

## 2019-03-18 DIAGNOSIS — I509 Heart failure, unspecified: Secondary | ICD-10-CM | POA: Diagnosis not present

## 2019-03-18 DIAGNOSIS — Z7901 Long term (current) use of anticoagulants: Secondary | ICD-10-CM | POA: Diagnosis not present

## 2019-03-18 DIAGNOSIS — Z823 Family history of stroke: Secondary | ICD-10-CM

## 2019-03-18 DIAGNOSIS — Z7951 Long term (current) use of inhaled steroids: Secondary | ICD-10-CM

## 2019-03-18 DIAGNOSIS — J9611 Chronic respiratory failure with hypoxia: Secondary | ICD-10-CM | POA: Diagnosis not present

## 2019-03-18 DIAGNOSIS — Z6839 Body mass index (BMI) 39.0-39.9, adult: Secondary | ICD-10-CM

## 2019-03-18 DIAGNOSIS — J189 Pneumonia, unspecified organism: Secondary | ICD-10-CM | POA: Diagnosis present

## 2019-03-18 DIAGNOSIS — J9610 Chronic respiratory failure, unspecified whether with hypoxia or hypercapnia: Secondary | ICD-10-CM | POA: Diagnosis not present

## 2019-03-18 DIAGNOSIS — R0689 Other abnormalities of breathing: Secondary | ICD-10-CM | POA: Diagnosis not present

## 2019-03-18 DIAGNOSIS — D539 Nutritional anemia, unspecified: Secondary | ICD-10-CM | POA: Diagnosis present

## 2019-03-18 DIAGNOSIS — I2510 Atherosclerotic heart disease of native coronary artery without angina pectoris: Secondary | ICD-10-CM | POA: Diagnosis present

## 2019-03-18 DIAGNOSIS — A4102 Sepsis due to Methicillin resistant Staphylococcus aureus: Secondary | ICD-10-CM | POA: Diagnosis present

## 2019-03-18 DIAGNOSIS — R509 Fever, unspecified: Secondary | ICD-10-CM | POA: Diagnosis not present

## 2019-03-18 DIAGNOSIS — J90 Pleural effusion, not elsewhere classified: Secondary | ICD-10-CM | POA: Diagnosis not present

## 2019-03-18 DIAGNOSIS — I480 Paroxysmal atrial fibrillation: Secondary | ICD-10-CM | POA: Diagnosis present

## 2019-03-18 DIAGNOSIS — R600 Localized edema: Secondary | ICD-10-CM | POA: Diagnosis not present

## 2019-03-18 DIAGNOSIS — M48061 Spinal stenosis, lumbar region without neurogenic claudication: Secondary | ICD-10-CM | POA: Diagnosis not present

## 2019-03-18 DIAGNOSIS — I4891 Unspecified atrial fibrillation: Secondary | ICD-10-CM | POA: Diagnosis not present

## 2019-03-18 DIAGNOSIS — Z96651 Presence of right artificial knee joint: Secondary | ICD-10-CM | POA: Diagnosis present

## 2019-03-18 DIAGNOSIS — E119 Type 2 diabetes mellitus without complications: Secondary | ICD-10-CM | POA: Diagnosis present

## 2019-03-18 DIAGNOSIS — R0902 Hypoxemia: Secondary | ICD-10-CM | POA: Diagnosis not present

## 2019-03-18 DIAGNOSIS — Z90710 Acquired absence of both cervix and uterus: Secondary | ICD-10-CM

## 2019-03-18 DIAGNOSIS — I110 Hypertensive heart disease with heart failure: Secondary | ICD-10-CM | POA: Diagnosis present

## 2019-03-18 DIAGNOSIS — J188 Other pneumonia, unspecified organism: Secondary | ICD-10-CM | POA: Diagnosis not present

## 2019-03-18 DIAGNOSIS — Z743 Need for continuous supervision: Secondary | ICD-10-CM | POA: Diagnosis not present

## 2019-03-18 DIAGNOSIS — I959 Hypotension, unspecified: Secondary | ICD-10-CM | POA: Diagnosis not present

## 2019-03-18 DIAGNOSIS — E785 Hyperlipidemia, unspecified: Secondary | ICD-10-CM | POA: Diagnosis present

## 2019-03-18 DIAGNOSIS — D61818 Other pancytopenia: Secondary | ICD-10-CM | POA: Diagnosis present

## 2019-03-18 DIAGNOSIS — R069 Unspecified abnormalities of breathing: Secondary | ICD-10-CM | POA: Diagnosis not present

## 2019-03-18 DIAGNOSIS — J449 Chronic obstructive pulmonary disease, unspecified: Secondary | ICD-10-CM | POA: Diagnosis not present

## 2019-03-18 DIAGNOSIS — F329 Major depressive disorder, single episode, unspecified: Secondary | ICD-10-CM | POA: Diagnosis present

## 2019-03-18 DIAGNOSIS — D649 Anemia, unspecified: Secondary | ICD-10-CM | POA: Diagnosis not present

## 2019-03-18 DIAGNOSIS — R279 Unspecified lack of coordination: Secondary | ICD-10-CM | POA: Diagnosis not present

## 2019-03-18 DIAGNOSIS — M549 Dorsalgia, unspecified: Secondary | ICD-10-CM | POA: Diagnosis present

## 2019-03-18 DIAGNOSIS — R7881 Bacteremia: Secondary | ICD-10-CM | POA: Diagnosis not present

## 2019-03-18 DIAGNOSIS — Z96641 Presence of right artificial hip joint: Secondary | ICD-10-CM | POA: Diagnosis present

## 2019-03-18 DIAGNOSIS — G40909 Epilepsy, unspecified, not intractable, without status epilepticus: Secondary | ICD-10-CM | POA: Diagnosis present

## 2019-03-18 DIAGNOSIS — Z79899 Other long term (current) drug therapy: Secondary | ICD-10-CM

## 2019-03-18 DIAGNOSIS — Z7982 Long term (current) use of aspirin: Secondary | ICD-10-CM

## 2019-03-18 DIAGNOSIS — I5033 Acute on chronic diastolic (congestive) heart failure: Secondary | ICD-10-CM | POA: Diagnosis present

## 2019-03-18 DIAGNOSIS — I10 Essential (primary) hypertension: Secondary | ICD-10-CM | POA: Diagnosis not present

## 2019-03-18 DIAGNOSIS — Z452 Encounter for adjustment and management of vascular access device: Secondary | ICD-10-CM | POA: Diagnosis not present

## 2019-03-18 DIAGNOSIS — J159 Unspecified bacterial pneumonia: Secondary | ICD-10-CM | POA: Diagnosis not present

## 2019-03-18 NOTE — ED Triage Notes (Signed)
Pt here via [facility] from home d/t shortness of breath. Pt on 3.5L [place] chronically. Ems temp 100.7 axillary. Pt presents to the ER vomiting, states that started 1hr ago.  Pt reports having her second covid shot Tuesday.

## 2019-03-19 ENCOUNTER — Encounter: Payer: Self-pay | Admitting: Family Medicine

## 2019-03-19 ENCOUNTER — Inpatient Hospital Stay: Payer: Medicare PPO

## 2019-03-19 DIAGNOSIS — D539 Nutritional anemia, unspecified: Secondary | ICD-10-CM | POA: Diagnosis present

## 2019-03-19 DIAGNOSIS — I110 Hypertensive heart disease with heart failure: Secondary | ICD-10-CM | POA: Diagnosis present

## 2019-03-19 DIAGNOSIS — J440 Chronic obstructive pulmonary disease with acute lower respiratory infection: Secondary | ICD-10-CM | POA: Diagnosis present

## 2019-03-19 DIAGNOSIS — J189 Pneumonia, unspecified organism: Secondary | ICD-10-CM

## 2019-03-19 DIAGNOSIS — Z96641 Presence of right artificial hip joint: Secondary | ICD-10-CM | POA: Diagnosis present

## 2019-03-19 DIAGNOSIS — L4050 Arthropathic psoriasis, unspecified: Secondary | ICD-10-CM | POA: Diagnosis present

## 2019-03-19 DIAGNOSIS — G8929 Other chronic pain: Secondary | ICD-10-CM | POA: Diagnosis present

## 2019-03-19 DIAGNOSIS — I480 Paroxysmal atrial fibrillation: Secondary | ICD-10-CM | POA: Diagnosis present

## 2019-03-19 DIAGNOSIS — Z20822 Contact with and (suspected) exposure to covid-19: Secondary | ICD-10-CM | POA: Diagnosis present

## 2019-03-19 DIAGNOSIS — G40909 Epilepsy, unspecified, not intractable, without status epilepticus: Secondary | ICD-10-CM | POA: Diagnosis present

## 2019-03-19 DIAGNOSIS — D696 Thrombocytopenia, unspecified: Secondary | ICD-10-CM | POA: Diagnosis present

## 2019-03-19 DIAGNOSIS — I959 Hypotension, unspecified: Secondary | ICD-10-CM | POA: Diagnosis not present

## 2019-03-19 DIAGNOSIS — J9610 Chronic respiratory failure, unspecified whether with hypoxia or hypercapnia: Secondary | ICD-10-CM | POA: Diagnosis not present

## 2019-03-19 DIAGNOSIS — M549 Dorsalgia, unspecified: Secondary | ICD-10-CM | POA: Diagnosis present

## 2019-03-19 DIAGNOSIS — E119 Type 2 diabetes mellitus without complications: Secondary | ICD-10-CM | POA: Diagnosis present

## 2019-03-19 DIAGNOSIS — Z96651 Presence of right artificial knee joint: Secondary | ICD-10-CM | POA: Diagnosis present

## 2019-03-19 DIAGNOSIS — F329 Major depressive disorder, single episode, unspecified: Secondary | ICD-10-CM | POA: Diagnosis present

## 2019-03-19 DIAGNOSIS — Z7901 Long term (current) use of anticoagulants: Secondary | ICD-10-CM | POA: Diagnosis not present

## 2019-03-19 DIAGNOSIS — Z955 Presence of coronary angioplasty implant and graft: Secondary | ICD-10-CM | POA: Diagnosis not present

## 2019-03-19 DIAGNOSIS — E785 Hyperlipidemia, unspecified: Secondary | ICD-10-CM | POA: Diagnosis present

## 2019-03-19 DIAGNOSIS — I5033 Acute on chronic diastolic (congestive) heart failure: Secondary | ICD-10-CM

## 2019-03-19 DIAGNOSIS — I2510 Atherosclerotic heart disease of native coronary artery without angina pectoris: Secondary | ICD-10-CM | POA: Diagnosis present

## 2019-03-19 DIAGNOSIS — D61818 Other pancytopenia: Secondary | ICD-10-CM | POA: Diagnosis present

## 2019-03-19 DIAGNOSIS — A4102 Sepsis due to Methicillin resistant Staphylococcus aureus: Secondary | ICD-10-CM | POA: Diagnosis present

## 2019-03-19 DIAGNOSIS — W19XXXA Unspecified fall, initial encounter: Secondary | ICD-10-CM | POA: Diagnosis not present

## 2019-03-19 DIAGNOSIS — J188 Other pneumonia, unspecified organism: Secondary | ICD-10-CM | POA: Diagnosis not present

## 2019-03-19 DIAGNOSIS — A419 Sepsis, unspecified organism: Secondary | ICD-10-CM | POA: Insufficient documentation

## 2019-03-19 DIAGNOSIS — D649 Anemia, unspecified: Secondary | ICD-10-CM | POA: Diagnosis not present

## 2019-03-19 DIAGNOSIS — Z8673 Personal history of transient ischemic attack (TIA), and cerebral infarction without residual deficits: Secondary | ICD-10-CM | POA: Diagnosis not present

## 2019-03-19 DIAGNOSIS — J9611 Chronic respiratory failure with hypoxia: Secondary | ICD-10-CM | POA: Diagnosis present

## 2019-03-19 LAB — MRSA PCR SCREENING: MRSA by PCR: POSITIVE — AB

## 2019-03-19 LAB — CBC WITH DIFFERENTIAL/PLATELET
Abs Immature Granulocytes: 0.03 10*3/uL (ref 0.00–0.07)
Basophils Absolute: 0 10*3/uL (ref 0.0–0.1)
Basophils Relative: 0 %
Eosinophils Absolute: 0.4 10*3/uL (ref 0.0–0.5)
Eosinophils Relative: 7 %
HCT: 28.2 % — ABNORMAL LOW (ref 36.0–46.0)
Hemoglobin: 8.8 g/dL — ABNORMAL LOW (ref 12.0–15.0)
Immature Granulocytes: 1 %
Lymphocytes Relative: 3 %
Lymphs Abs: 0.2 10*3/uL — ABNORMAL LOW (ref 0.7–4.0)
MCH: 34.1 pg — ABNORMAL HIGH (ref 26.0–34.0)
MCHC: 31.2 g/dL (ref 30.0–36.0)
MCV: 109.3 fL — ABNORMAL HIGH (ref 80.0–100.0)
Monocytes Absolute: 0.2 10*3/uL (ref 0.1–1.0)
Monocytes Relative: 4 %
Neutro Abs: 4.6 10*3/uL (ref 1.7–7.7)
Neutrophils Relative %: 85 %
Platelets: 83 10*3/uL — ABNORMAL LOW (ref 150–400)
RBC: 2.58 MIL/uL — ABNORMAL LOW (ref 3.87–5.11)
RDW: 14.6 % (ref 11.5–15.5)
WBC: 5.4 10*3/uL (ref 4.0–10.5)
nRBC: 0 % (ref 0.0–0.2)

## 2019-03-19 LAB — COMPREHENSIVE METABOLIC PANEL
ALT: 10 U/L (ref 0–44)
AST: 31 U/L (ref 15–41)
Albumin: 2.4 g/dL — ABNORMAL LOW (ref 3.5–5.0)
Alkaline Phosphatase: 108 U/L (ref 38–126)
Anion gap: 8 (ref 5–15)
BUN: 19 mg/dL (ref 8–23)
CO2: 21 mmol/L — ABNORMAL LOW (ref 22–32)
Calcium: 7.5 mg/dL — ABNORMAL LOW (ref 8.9–10.3)
Chloride: 106 mmol/L (ref 98–111)
Creatinine, Ser: 1.12 mg/dL — ABNORMAL HIGH (ref 0.44–1.00)
GFR calc Af Amer: 53 mL/min — ABNORMAL LOW (ref >60)
GFR calc non Af Amer: 46 mL/min — ABNORMAL LOW (ref >60)
Glucose, Bld: 126 mg/dL — ABNORMAL HIGH (ref 70–99)
Potassium: 4 mmol/L (ref 3.5–5.1)
Sodium: 135 mmol/L (ref 135–145)
Total Bilirubin: 1.5 mg/dL — ABNORMAL HIGH (ref 0.3–1.2)
Total Protein: 7.4 g/dL (ref 6.5–8.1)

## 2019-03-19 LAB — BASIC METABOLIC PANEL
Anion gap: 3 — ABNORMAL LOW (ref 5–15)
BUN: 22 mg/dL (ref 8–23)
CO2: 22 mmol/L (ref 22–32)
Calcium: 7.1 mg/dL — ABNORMAL LOW (ref 8.9–10.3)
Chloride: 109 mmol/L (ref 98–111)
Creatinine, Ser: 1.26 mg/dL — ABNORMAL HIGH (ref 0.44–1.00)
GFR calc Af Amer: 46 mL/min — ABNORMAL LOW (ref >60)
GFR calc non Af Amer: 40 mL/min — ABNORMAL LOW (ref >60)
Glucose, Bld: 139 mg/dL — ABNORMAL HIGH (ref 70–99)
Potassium: 4.3 mmol/L (ref 3.5–5.1)
Sodium: 134 mmol/L — ABNORMAL LOW (ref 135–145)

## 2019-03-19 LAB — BRAIN NATRIURETIC PEPTIDE: B Natriuretic Peptide: 608 pg/mL — ABNORMAL HIGH (ref 0.0–100.0)

## 2019-03-19 LAB — CBC
HCT: 24.1 % — ABNORMAL LOW (ref 36.0–46.0)
Hemoglobin: 7.7 g/dL — ABNORMAL LOW (ref 12.0–15.0)
MCH: 35 pg — ABNORMAL HIGH (ref 26.0–34.0)
MCHC: 32 g/dL (ref 30.0–36.0)
MCV: 109.5 fL — ABNORMAL HIGH (ref 80.0–100.0)
Platelets: 72 10*3/uL — ABNORMAL LOW (ref 150–400)
RBC: 2.2 MIL/uL — ABNORMAL LOW (ref 3.87–5.11)
RDW: 14.8 % (ref 11.5–15.5)
WBC: 8.2 10*3/uL (ref 4.0–10.5)
nRBC: 0 % (ref 0.0–0.2)

## 2019-03-19 LAB — RESPIRATORY PANEL BY RT PCR (FLU A&B, COVID)
Influenza A by PCR: NEGATIVE
Influenza B by PCR: NEGATIVE
SARS Coronavirus 2 by RT PCR: NEGATIVE

## 2019-03-19 LAB — PROTIME-INR
INR: 1.7 — ABNORMAL HIGH (ref 0.8–1.2)
Prothrombin Time: 20.2 seconds — ABNORMAL HIGH (ref 11.4–15.2)

## 2019-03-19 LAB — LACTIC ACID, PLASMA
Lactic Acid, Venous: 1.7 mmol/L (ref 0.5–1.9)
Lactic Acid, Venous: 1.7 mmol/L (ref 0.5–1.9)

## 2019-03-19 LAB — GLUCOSE, CAPILLARY
Glucose-Capillary: 100 mg/dL — ABNORMAL HIGH (ref 70–99)
Glucose-Capillary: 103 mg/dL — ABNORMAL HIGH (ref 70–99)
Glucose-Capillary: 100 mg/dL — ABNORMAL HIGH (ref 70–99)
Glucose-Capillary: 70 mg/dL (ref 70–99)

## 2019-03-19 LAB — LIPASE, BLOOD: Lipase: 21 U/L (ref 11–51)

## 2019-03-19 LAB — POC SARS CORONAVIRUS 2 AG: SARS Coronavirus 2 Ag: NEGATIVE

## 2019-03-19 LAB — PROCALCITONIN: Procalcitonin: 0.44 ng/mL

## 2019-03-19 MED ORDER — VANCOMYCIN HCL 1250 MG/250ML IV SOLN
1250.0000 mg | Freq: Once | INTRAVENOUS | Status: AC
  Administered 2019-03-19: 1250 mg via INTRAVENOUS
  Filled 2019-03-19: qty 250

## 2019-03-19 MED ORDER — ACETAMINOPHEN 500 MG PO TABS
1000.0000 mg | ORAL_TABLET | Freq: Once | ORAL | Status: AC
  Administered 2019-03-19: 1000 mg via ORAL
  Filled 2019-03-19: qty 2

## 2019-03-19 MED ORDER — LOSARTAN POTASSIUM 50 MG PO TABS
100.0000 mg | ORAL_TABLET | Freq: Every day | ORAL | Status: DC
  Administered 2019-03-19: 100 mg via ORAL
  Filled 2019-03-19 (×2): qty 2

## 2019-03-19 MED ORDER — PHENYTOIN SODIUM EXTENDED 100 MG PO CAPS
400.0000 mg | ORAL_CAPSULE | Freq: Every day | ORAL | Status: DC
  Administered 2019-03-19 – 2019-03-28 (×10): 400 mg via ORAL
  Filled 2019-03-19 (×11): qty 4

## 2019-03-19 MED ORDER — OMEGA-3-ACID ETHYL ESTERS 1 G PO CAPS
1.0000 g | ORAL_CAPSULE | Freq: Every day | ORAL | Status: DC
  Administered 2019-03-19 – 2019-03-29 (×11): 1 g via ORAL
  Filled 2019-03-19 (×11): qty 1

## 2019-03-19 MED ORDER — DABIGATRAN ETEXILATE MESYLATE 150 MG PO CAPS
150.0000 mg | ORAL_CAPSULE | Freq: Two times a day (BID) | ORAL | Status: DC
  Administered 2019-03-19 – 2019-03-20 (×3): 150 mg via ORAL
  Filled 2019-03-19 (×4): qty 1

## 2019-03-19 MED ORDER — ACETAMINOPHEN 650 MG RE SUPP: 650.0000 mg | Freq: Four times a day (QID) | RECTAL | Status: DC | PRN

## 2019-03-19 MED ORDER — SERTRALINE HCL 25 MG PO TABS
75.0000 mg | ORAL_TABLET | Freq: Every day | ORAL | Status: DC
  Administered 2019-03-19 – 2019-03-29 (×10): 75 mg via ORAL
  Filled 2019-03-19: qty 2
  Filled 2019-03-19: qty 3
  Filled 2019-03-19 (×5): qty 2
  Filled 2019-03-19: qty 3
  Filled 2019-03-19 (×2): qty 2

## 2019-03-19 MED ORDER — SODIUM CHLORIDE 0.9 % IV SOLN
2.0000 g | INTRAVENOUS | Status: DC
  Administered 2019-03-19 – 2019-03-20 (×2): 2 g via INTRAVENOUS
  Filled 2019-03-19 (×2): qty 2

## 2019-03-19 MED ORDER — ONDANSETRON HCL 4 MG PO TABS: 4.0000 mg | ORAL_TABLET | Freq: Four times a day (QID) | ORAL | Status: DC | PRN

## 2019-03-19 MED ORDER — FUROSEMIDE 10 MG/ML IJ SOLN
40.0000 mg | Freq: Two times a day (BID) | INTRAMUSCULAR | Status: DC
  Administered 2019-03-19 – 2019-03-20 (×3): 40 mg via INTRAVENOUS
  Filled 2019-03-19 (×3): qty 4

## 2019-03-19 MED ORDER — ASPIRIN EC 81 MG PO TBEC
81.0000 mg | DELAYED_RELEASE_TABLET | Freq: Every day | ORAL | Status: DC
  Administered 2019-03-19 – 2019-03-20 (×2): 81 mg via ORAL
  Filled 2019-03-19 (×2): qty 1

## 2019-03-19 MED ORDER — VANCOMYCIN HCL IN DEXTROSE 1-5 GM/200ML-% IV SOLN
1000.0000 mg | Freq: Once | INTRAVENOUS | Status: AC
  Administered 2019-03-19: 1000 mg via INTRAVENOUS
  Filled 2019-03-19: qty 200

## 2019-03-19 MED ORDER — POLYETHYLENE GLYCOL 3350 17 G PO PACK
17.0000 g | PACK | Freq: Every day | ORAL | Status: DC
  Administered 2019-03-19 – 2019-03-29 (×8): 17 g via ORAL
  Filled 2019-03-19 (×10): qty 1

## 2019-03-19 MED ORDER — LACTATED RINGERS IV BOLUS (SEPSIS)
1000.0000 mL | Freq: Once | INTRAVENOUS | Status: AC
  Administered 2019-03-19: 1000 mL via INTRAVENOUS

## 2019-03-19 MED ORDER — ACETAMINOPHEN 325 MG PO TABS
650.0000 mg | ORAL_TABLET | Freq: Four times a day (QID) | ORAL | Status: DC | PRN
  Filled 2019-03-19: qty 2

## 2019-03-19 MED ORDER — LEVETIRACETAM 750 MG PO TABS
750.0000 mg | ORAL_TABLET | Freq: Two times a day (BID) | ORAL | Status: DC
  Administered 2019-03-19 – 2019-03-29 (×21): 750 mg via ORAL
  Filled 2019-03-19 (×22): qty 1

## 2019-03-19 MED ORDER — FLUTICASONE PROPIONATE 50 MCG/ACT NA SUSP
2.0000 | Freq: Every day | NASAL | Status: DC
  Administered 2019-03-22 – 2019-03-29 (×8): 2 via NASAL
  Filled 2019-03-19 (×2): qty 16

## 2019-03-19 MED ORDER — MUPIROCIN 2 % EX OINT
TOPICAL_OINTMENT | Freq: Two times a day (BID) | CUTANEOUS | Status: DC
  Administered 2019-03-20: 1 via TOPICAL
  Filled 2019-03-19: qty 22

## 2019-03-19 MED ORDER — ROSUVASTATIN CALCIUM 10 MG PO TABS
20.0000 mg | ORAL_TABLET | Freq: Every day | ORAL | Status: DC
  Administered 2019-03-19 – 2019-03-28 (×10): 20 mg via ORAL
  Filled 2019-03-19 (×10): qty 2

## 2019-03-19 MED ORDER — METOPROLOL SUCCINATE ER 50 MG PO TB24
50.0000 mg | ORAL_TABLET | Freq: Every day | ORAL | Status: DC
  Administered 2019-03-19 – 2019-03-27 (×7): 50 mg via ORAL
  Filled 2019-03-19 (×10): qty 1

## 2019-03-19 MED ORDER — SODIUM CHLORIDE 0.9% FLUSH
10.0000 mL | Freq: Two times a day (BID) | INTRAVENOUS | Status: DC
  Administered 2019-03-19 – 2019-03-29 (×15): 10 mL

## 2019-03-19 MED ORDER — VANCOMYCIN HCL 750 MG/150ML IV SOLN
750.0000 mg | Freq: Once | INTRAVENOUS | Status: DC
  Filled 2019-03-19: qty 150

## 2019-03-19 MED ORDER — ONDANSETRON HCL 4 MG/2ML IJ SOLN: 4.0000 mg | Freq: Four times a day (QID) | INTRAMUSCULAR | Status: DC | PRN

## 2019-03-19 MED ORDER — MAGNESIUM HYDROXIDE 400 MG/5ML PO SUSP: 30.0000 mL | Freq: Every day | ORAL | Status: DC | PRN

## 2019-03-19 MED ORDER — AMLODIPINE BESYLATE 5 MG PO TABS
10.0000 mg | ORAL_TABLET | Freq: Every day | ORAL | Status: DC
  Administered 2019-03-19: 10 mg via ORAL
  Filled 2019-03-19 (×2): qty 2

## 2019-03-19 MED ORDER — VANCOMYCIN HCL 750 MG/150ML IV SOLN
750.0000 mg | INTRAVENOUS | Status: DC
  Administered 2019-03-20: 750 mg via INTRAVENOUS
  Filled 2019-03-19 (×2): qty 150

## 2019-03-19 MED ORDER — GABAPENTIN 100 MG PO CAPS
100.0000 mg | ORAL_CAPSULE | Freq: Two times a day (BID) | ORAL | Status: DC
  Administered 2019-03-19 – 2019-03-29 (×21): 100 mg via ORAL
  Filled 2019-03-19 (×21): qty 1

## 2019-03-19 MED ORDER — INSULIN ASPART 100 UNIT/ML ~~LOC~~ SOLN
0.0000 [IU] | Freq: Three times a day (TID) | SUBCUTANEOUS | Status: DC
  Administered 2019-03-21 – 2019-03-23 (×4): 1 [IU] via SUBCUTANEOUS
  Administered 2019-03-25 (×2): 2 [IU] via SUBCUTANEOUS
  Administered 2019-03-26: 1 [IU] via SUBCUTANEOUS
  Administered 2019-03-27: 2 [IU] via SUBCUTANEOUS
  Administered 2019-03-28 (×3): 1 [IU] via SUBCUTANEOUS
  Filled 2019-03-19 (×11): qty 1

## 2019-03-19 MED ORDER — TRAZODONE HCL 50 MG PO TABS
25.0000 mg | ORAL_TABLET | Freq: Every evening | ORAL | Status: DC | PRN
  Administered 2019-03-19: 25 mg via ORAL
  Filled 2019-03-19: qty 1

## 2019-03-19 MED ORDER — SODIUM CHLORIDE 0.9 % IV SOLN
500.0000 mg | INTRAVENOUS | Status: DC
  Administered 2019-03-19 – 2019-03-20 (×2): 500 mg via INTRAVENOUS
  Filled 2019-03-19 (×2): qty 500

## 2019-03-19 MED ORDER — NYSTATIN-TRIAMCINOLONE 100000-0.1 UNIT/GM-% EX CREA
TOPICAL_CREAM | Freq: Two times a day (BID) | CUTANEOUS | Status: DC
  Administered 2019-03-20: 1 via TOPICAL
  Filled 2019-03-19: qty 15

## 2019-03-19 MED ORDER — SODIUM CHLORIDE 0.9 % IV SOLN
2.0000 g | Freq: Once | INTRAVENOUS | Status: AC
  Administered 2019-03-19: 2 g via INTRAVENOUS
  Filled 2019-03-19: qty 2

## 2019-03-19 MED ORDER — CHLORHEXIDINE GLUCONATE CLOTH 2 % EX PADS
6.0000 | MEDICATED_PAD | Freq: Every day | CUTANEOUS | Status: DC
  Administered 2019-03-19 – 2019-03-29 (×11): 6 via TOPICAL

## 2019-03-19 NOTE — ED Notes (Signed)
1 Set of blood cultures obtained d/t patient being hard stick and lab being unable to collect labs.

## 2019-03-19 NOTE — Evaluation (Signed)
Physical Therapy Evaluation Patient Details Name: Brooke Baker MRN: [id number] DOB: 1934/11/17 Today's Date: 03/19/2019   History of Present Illness  Brooke Baker  is a 85 y.o. Caucasian female with a known history of multiple medical problems that are mentioned below including COPD, type diabetes mellitus, hypertension, dyslipidemia and coronary artery disease, who presented to the emergency room with acute onset of fever with associated generalized weakness and dyspnea without significant cough and wheezing. Found to be septic with progressive lung disease with pneumonia. Patient on chronic O2 3-4L, is ind with ADLs with RW.  Clinical Impression  Pt is motivated to work with PT. Orthostatics completed and BP within exercise limits (see note) with most difficulty with oxygen levels. Sitting O2 98%, standing 0-31mins 93%, but following 22ft of ambulation O2 drops to 73%. PT pulled chair behind patient to sit and cued patient through pursed lip breathing. Education provided on energy conservation, with increased time and increasing O2 to 4.5L needed to restore O2 >90%. Following, chair to bed (100ft) transfer completed where patient requires modA for LE management into bed, where O2 dropped again to 80% and needed increased time to restore to WNL. Patient requires minA for STS transfers with cuing for RW management, and needs continued cuing for RW safety throughout short ambulation distance. At this time, PT recommendation is 24 hour supervision of O2 monitoring, if not available then SNF placement. Would benefit from skilled PT to address above deficits and promote optimal return to PLOF.     Follow Up Recommendations Supervision/Assistance - 24 hour;SNF    Equipment Recommendations  Rolling walker with 5" wheels    Recommendations for Other Services       Precautions / Restrictions Precautions Precaution Comments: OXYGEN Restrictions Weight Bearing Restrictions: No      Mobility  Bed  Mobility Overal bed mobility: Needs Assistance Bed Mobility: Sit to Supine       Sit to supine: Mod assist   General bed mobility comments: modA needed for LE management into bed  Transfers Overall transfer level: Needs assistance   Transfers: Sit to/from Stand Sit to Stand: Min assist         General transfer comment: Most physical assist needed for initiation, cuing for set up with RW  Ambulation/Gait Ambulation/Gait assistance: Min guard Gait Distance (Feet): 10 Feet Assistive device: Rolling walker (2 wheeled) Gait Pattern/deviations: Shuffle Gait velocity: decreased   General Gait Details: shuffle steps with RW with cuing to "stay inside" RW needed.  After 77ft of ambulation PT pulls chair behind patient to sit d/t O2 73% and patient SOB  Stairs            Wheelchair Mobility    Modified Rankin (Stroke Patients Only)       Balance Overall balance assessment: Needs assistance;History of Falls Sitting-balance support: Feet supported;No upper extremity supported Sitting balance-Leahy Scale: Fair     Standing balance support: During functional activity Standing balance-Leahy Scale: Poor                               Pertinent Vitals/Pain Pain Assessment: No/denies pain    Home Living Family/patient expects to be discharged to:: Private residence Living Arrangements: Spouse/significant other Available Help at Discharge: Family;Available 24 hours/day Type of Home: House Home Access: Ramped entrance     Home Layout: One level Home Equipment: Wheelchair - manual;Cane - single point;Walker - 4 wheels;Grab bars - toilet;Bedside commode Additional Comments:  Uses RW for all ambulation, ind with ADs    Prior Function Level of Independence: Needs assistance   Gait / Transfers Assistance Needed: RW for all ambulation  ADL's / Homemaking Assistance Needed: daughter cleans house, husband does chores        Hand Dominance   Dominant  Hand: Right    Extremity/Trunk Assessment   Upper Extremity Assessment Upper Extremity Assessment: Generalized weakness    Lower Extremity Assessment Lower Extremity Assessment: Generalized weakness    Cervical / Trunk Assessment Cervical / Trunk Assessment: Kyphotic  Communication   Communication: No difficulties  Cognition Arousal/Alertness: Awake/alert Behavior During Therapy: WFL for tasks assessed/performed Overall Cognitive Status: Within Functional Limits for tasks assessed                                        General Comments      Exercises Other Exercises Other Exercises: STS x2 trials with minA needed for initiation and cuing for RW set up each time Other Exercises: sit > supine with modA needed for LE management into bed with cuing needed for technique/sequencing Other Exercises: O2 sitting 98%; standing 93%; with ambulation patient requires CGA for safety and heavy cuing for RW management and safety, and PT ultimately needs to pull chair behind patient d/t O2 73%. PLB and energy conservation education, prolonged time needed with cuing for this to raise O2 to 90% in order to complete 3step chair > bed transfer. Following this transfer O2 80% with heavy cuing for breath techniques to raise to 93% on 4.5L. Needs MinA repositioning in bed   Assessment/Plan    PT Assessment Patient needs continued PT services  PT Problem List Decreased strength;Decreased mobility;Decreased range of motion;Decreased balance;Decreased activity tolerance;Cardiopulmonary status limiting activity       PT Treatment Interventions DME instruction;Therapeutic activities;Gait training;Therapeutic exercise;Patient/family education;Balance training;Functional mobility training;Neuromuscular re-education;Manual techniques    PT Goals (Current goals can be found in the Care Plan section)  Acute Rehab PT Goals Patient Stated Goal: Go home and breath better PT Goal Formulation:  With patient Time For Goal Achievement: 04/02/19 Potential to Achieve Goals: Fair    Frequency Min 2X/week   Barriers to discharge Decreased caregiver support      Co-evaluation               AM-PAC PT "6 Clicks" Mobility  Outcome Measure Help needed turning from your back to your side while in a flat bed without using bedrails?: A Little Help needed moving from lying on your back to sitting on the side of a flat bed without using bedrails?: A Lot Help needed moving to and from a bed to a chair (including a wheelchair)?: A Little Help needed standing up from a chair using your arms (e.g., wheelchair or bedside chair)?: A Little Help needed to walk in hospital room?: A Lot Help needed climbing 3-5 steps with a railing? : A Lot 6 Click Score: 15    End of Session Equipment Utilized During Treatment: Gait belt;Oxygen Activity Tolerance: Patient tolerated treatment well Patient left: in bed;with call bell/phone within reach;with bed alarm set;with family/visitor present Nurse Communication: Mobility status(Unable to reach nurse, routed note to Dr. Reesa Chew about O2) PT Visit Diagnosis: Unsteadiness on feet (R26.81);Muscle weakness (generalized) (M62.81);Other abnormalities of gait and mobility (R26.89);History of falling (Z91.81);Difficulty in walking, not elsewhere classified (R26.2)    Time: [id number] PT Time Calculation (min) (  ACUTE ONLY): 47 min   Charges:   PT Evaluation $PT Eval Moderate Complexity: 1 Mod PT Treatments $Therapeutic Activity: 38-52 mins        Shelton Silvas PT, DPT  Shelton Silvas 03/19/2019, 3:17 PM

## 2019-03-19 NOTE — H&P (Signed)
[facility] [place] NAME: Brooke Baker    MR#:  [id number]  DATE OF BIRTH:  [birth year]  DATE OF ADMISSION:  03/18/2019  PRIMARY CARE PHYSICIAN: Einar Pheasant, MD   REQUESTING/REFERRING PHYSICIAN: Hinda Kehr, MD  CHIEF COMPLAINT:   Chief Complaint  Patient presents with  . Shortness of Breath    HISTORY OF PRESENT ILLNESS:  Brooke Baker  is a 85 y.o. Caucasian female with a known history of multiple medical problems that are mentioned below including COPD, type diabetes mellitus, hypertension, dyslipidemia and coronary artery disease, who presented to the emergency room with acute onset of fever with associated generalized weakness and dyspnea without significant cough and wheezing.  She has been having orthopnea and paroxysmal nocturnal dyspnea as well as worsening lower extremity edema.  She admits to dyspnea on exertion.  She admitted to nausea and vomiting without abdominal pain.  She denies any dysuria, oliguria or hematuria or flank pain.  She admits to rhinorrhea and nasal and sinus congestion.  No chest pain or palpitations.  No sore throat or earache.  She denies any diarrhea or melena or bright red bleeding per rectum.  Upon presentation emergency room, blood pressure was 149/66 and temperature was 103 with a heart rate of 117 and respiratory rate was up to 25 with pulse 70 of 97% on room air.  Labs revealed CO2 of 21 BUN of 19 and creatinine 1.12, total bili of 1.5.  BNP was 608, lactic acid 1.7 and procalcitonin 0.44.  CBC showed hemoglobin of 8.8 hematocrit 28.2 WBC 5.4 and platelets of 83.  PCR and influenza antigens came back negative.  2 blood cultures were drawn.  Portable chest ray showed interval progression of interstitial densities that may represent progression of lung disease versus superimposed pneumonia. EKG showed atrial fibrillation with rapid ventricular sponsor 117 with left anterior fascicular block, LAD and low voltage QRS with  Q waves anteroseptally  The patient was given a gram of p.o. Tylenol, 1 L IV lactated ringer as well as IV cefepime and vancomycin.  She will be admitted to telemetry bed for further evaluation and management.  PAST MEDICAL HISTORY:   Past Medical History:  Diagnosis Date  . Acute bronchitis   . Allergic rhinitis   . Arthritis   . CAD (coronary artery disease)    s/p stent LAD 8/03 and stent placement OM1  . Cancer ([place])    skin  . Chronic back pain   . COPD with asthma ([place])   . CVA (cerebral vascular accident) ([place])   . Diabetes mellitus ([place])   . Hypercholesterolemia   . Hypertension   . Seizure disorder ([place])     PAST SURGICAL HISTORY:   Past Surgical History:  Procedure Laterality Date  . BREAST BIOPSY Bilateral    [place]  . CORONARY STENT PLACEMENT  8/03   LAD  . CORONARY STENT PLACEMENT     OM1  2006  . FEMUR FRACTURE SURGERY Right [id number]   Dr. Marry Guan  . LUMBAR SPINE SURGERY     x3  . TOTAL ABDOMINAL HYSTERECTOMY     appendectomy - age 64  . TOTAL HIP ARTHROPLASTY    . TOTAL KNEE ARTHROPLASTY     2005    SOCIAL HISTORY:   Social History   Tobacco Use  . Smoking status: Never Smoker  . Smokeless tobacco: Never Used  Substance Use Topics  . Alcohol use: No    Alcohol/week: 0.0 standard drinks  FAMILY HISTORY:   Family History  Problem Relation Age of Onset  . Allergies Brother   . Cancer Brother   . Allergies Sister   . Cancer Sister        Non-hodgkins lymphoma  . Asthma Sister   . Rheum arthritis Sister   . Stroke Mother   . Asthma Brother   . Rheum arthritis Brother   . Cancer Sister        non-hodgkins lymphoma  . Diabetes Brother     DRUG ALLERGIES:   Allergies  Allergen Reactions  . Doxycycline Diarrhea and Nausea Only  . Methotrexate Rash    REVIEW OF SYSTEMS:   ROS As per history of present illness. All pertinent systems were reviewed above. Constitutional,  HEENT, cardiovascular, respiratory, GI, GU,  musculoskeletal, neuro, psychiatric, endocrine,  integumentary and hematologic systems were reviewed and are otherwise  negative/unremarkable except for positive findings mentioned above in the HPI.   MEDICATIONS AT HOME:   Prior to Admission medications   Medication Sig Start Date End Date Taking? Authorizing Provider  albuterol (PROVENTIL) (2.5 MG/3ML) 0.083% nebulizer solution USE ONE VIAL VIA NEBULZIER EVERY FOUR HOURS AS NEEDED FOR WHEEZING 12/08/18  Yes Einar Pheasant, MD  albuterol (VENTOLIN HFA) 108 (90 Base) MCG/ACT inhaler USE 2 PUFFS EVERY 4 HOURS AS DIRECTED - RESCUE 12/08/18  Yes Einar Pheasant, MD  amLODipine (NORVASC) 10 MG tablet TAKE 1 TABLET BY MOUTH DAILY 10/26/18  Yes Einar Pheasant, MD  aspirin 81 MG tablet Take 81 mg by mouth daily.     Yes [provider]  budesonide-formoterol (SYMBICORT) 80-4.5 MCG/ACT inhaler INHALE 2 PUFFS TWICE A DAY Patient taking differently: Inhale 2 puffs into the lungs 2 (two) times daily.  06/08/18  Yes Young, Tarri Fuller D, MD  dabigatran (PRADAXA) 150 MG CAPS Take 150 mg by mouth every 12 (twelve) hours.   Yes [provider]  fluticasone (FLONASE) 50 MCG/ACT nasal spray USE TWO SPRAYS IN EACH NOSTRIL EACH DAY AS DIRECTED BY PHYSICIAN 03/02/17  Yes Einar Pheasant, MD  furosemide (LASIX) 40 MG tablet Take 40 mg by mouth daily.   Yes [provider]  gabapentin (NEURONTIN) 100 MG capsule TAKE ONE CAPSULE BY MOUTH TWICE A DAY 03/10/19  Yes Einar Pheasant, MD  levETIRAcetam (KEPPRA) 750 MG tablet Take 1 tablet (750 mg total) by mouth 2 (two) times daily. 03/03/19  Yes Melvenia Beam, MD  losartan (COZAAR) 100 MG tablet TAKE 1 TABLET BY MOUTH DAILY Patient taking differently: Take 100 mg by mouth daily.  08/08/18  Yes Einar Pheasant, MD  metoprolol succinate (TOPROL-XL) 50 MG 24 hr tablet TAKE 1 TABLET BY MOUTH TWICE A DAY WITH OR IMMEDIATELY FOLLOWING A MEAL 12/08/18  Yes Einar Pheasant, MD  mupirocin ointment (BACTROBAN) 2  % Apply to affected area on leg twice a day 11/08/18  Yes Einar Pheasant, MD  nystatin-triamcinolone (MYCOLOG II) cream APPLY 1 APPLICATION TOPICALLY 2 TIMES DAILY TO BOTTOM 11/23/18  Yes Einar Pheasant, MD  Omega-3 Fatty Acids (FISH OIL) 1000 MG CAPS Take by mouth.   Yes [provider]  phenytoin (DILANTIN) 100 MG ER capsule Take 4 capsules (400 mg total) by mouth at bedtime. 03/03/19  Yes Melvenia Beam, MD  polyethylene glycol (MIRALAX / GLYCOLAX) 17 g packet Take 17 g by mouth daily.   Yes [provider]  potassium chloride (KLOR-CON) 10 MEQ tablet Take 1 tablet (10 mEq total) by mouth daily. 12/08/18  Yes Einar Pheasant, MD  rosuvastatin ([place])  20 MG tablet TAKE 1 TABLET BY MOUTH DAILY 03/10/19  Yes Einar Pheasant, MD  sertraline (ZOLOFT) 50 MG tablet TAKE 1 AND 1/2 TABLETS BY MOUTH ONCE A DAY. 01/09/19  Yes Einar Pheasant, MD      VITAL SIGNS:  Blood pressure 105/76, pulse 94, temperature (!) 101 F (38.3 C), temperature source Oral, resp. rate (!) 21, height 5\' 6"  (1.676 m), weight 73.5 kg, last menstrual period 08/17/1965, SpO2 100 %.  PHYSICAL EXAMINATION:  Physical Exam  GENERAL:  85 y.o.-year-old Caucasian female patient lying in the bed with no acute distress.  EYES: Pupils equal, round, reactive to light and accommodation. No scleral icterus. Extraocular muscles intact.  HEENT: Head atraumatic, normocephalic. Oropharynx and nasopharynx clear.  NECK:  Supple, no jugular venous distention. No thyroid enlargement, no tenderness.  LUNGS: Diminished bibasal breath sounds with bibasal crackles.  CARDIOVASCULAR: Regular rate and rhythm, S1, S2 normal. No murmurs, rubs, or gallops.  ABDOMEN: Soft, nondistended, nontender. Bowel sounds present. No organomegaly or mass.  EXTREMITIES: 1-2+ bilateral lower extremity pitting edema with no cyanosis, or clubbing as shown below.  NEUROLOGIC: Cranial nerves II through XII are intact. Muscle strength 5/5 in all  extremities. Sensation intact. Gait not checked.  PSYCHIATRIC: The patient is alert and oriented x 3.  Normal affect and good eye contact. SKIN: As above.  No otherwise obvious rash, lesion, or ulcer.     LABORATORY PANEL:   CBC Recent Labs  Lab 03/19/19 0036  WBC 5.4  HGB 8.8*  HCT 28.2*  PLT 83*   ------------------------------------------------------------------------------------------------------------------  Chemistries  Recent Labs  Lab 03/19/19 0036  NA 135  K 4.0  CL 106  CO2 21*  GLUCOSE 126*  BUN 19  CREATININE 1.12*  CALCIUM 7.5*  AST 31  ALT 10  ALKPHOS 108  BILITOT 1.5*   ------------------------------------------------------------------------------------------------------------------  Cardiac Enzymes No results for input(s): TROPONINI in the last 168 hours. ------------------------------------------------------------------------------------------------------------------  RADIOLOGY:  DG Chest Port 1 View  Result Date: 03/18/2019 CLINICAL DATA:  85 year old female with sepsis. EXAM: PORTABLE CHEST 1 VIEW COMPARISON:  Chest radiograph dated 04/20/2017. FINDINGS: There is chronic interstitial coarsening and bronchitic changes. Overall slight interval progression of interstitial densities since the prior radiograph which may represent progression of background of lung disease although superimposed pneumonia is not excluded. Clinical correlation is recommended. No focal consolidation or pneumothorax. Trace bilateral pleural effusions may be present. There is mild cardiomegaly. Atherosclerotic calcification of the aorta. No acute osseous pathology. IMPRESSION: Slight interval progression of interstitial densities may represent progression of lung disease versus superimposed pneumonia. Electronically Signed   By: Anner Crete M.D.   On: 03/18/2019 23:43      IMPRESSION AND PLAN:   1.  Community-acquired pneumonia with subsequent sepsis without severe  sepsis or septic shock. -The patient will be admitted to a telemetry bed. -She will be placed on IV Rocephin and Zithromax. -We will obtain sputum culture as well as pneumonia antigens and follow blood cultures. -She will be placed on scheduled and as needed duo nebs. -Mucolytic therapy will be provided.  2.  Acute on chronic diastolic CHF, possibly cor pulmonale.  She has a history of preserved EF of 55% with NYHA class II- III ACC/AHA stage C. -The patient will be diuresed with IV Lasix as much his blood pressure will tolerate. -We will follow serial troponin I's. -We will obtain a 2D echo.  3.  Paroxysmal atrial fibrillation with slightly rapid ventricular response, currently better. -We will continue Toprol-XL as  well as Pradaxa.  4.  Anemia. -This slightly worsening compared to January of this year. -We will monitor CBC.  5.  Seizure disorder. -We will continue her seizure medications.  6.  Dyslipidemia. -We will continue statin therapy.  7.  Depression. -We will continue Zoloft.  8.  Hypertension. -We will continue her antihypertensives.   All the records are reviewed and case discussed with ED provider. The plan of care was discussed in details with the patient (and family). I answered all questions. The patient agreed to proceed with the above mentioned plan. Further management will depend upon hospital course.   CODE STATUS: Full code  TOTAL TIME TAKING CARE OF THIS PATIENT: 55 minutes.    Christel Mormon M.D on 03/19/2019 at 3:17 AM  [facility]   From 7 PM-7 AM, contact night-coverage [email or web address]  CC: Primary care physician; Einar Pheasant, MD   Note: This dictation was prepared with Dragon dictation along with smaller phrase technology. Any transcriptional errors that result from this process are unintentional.

## 2019-03-19 NOTE — ED Notes (Signed)
Lab at bedside

## 2019-03-19 NOTE — ED Notes (Signed)
Pt's IV on the right arm (22g) infiltrated, DR. Mansy at bedside at this time,. MD Mansy asked to place code status. Pt's 20g IV in the left upper arm placed by IV team is functional

## 2019-03-19 NOTE — ED Notes (Signed)
ED Provider at bedside. 

## 2019-03-19 NOTE — ED Notes (Signed)
Iv team at bedside  

## 2019-03-19 NOTE — Progress Notes (Signed)
No charge progress note.   Brooke Baker  is a 85 y.o. Caucasian female with a known history of multiple medical problems that are mentioned below including COPD, type diabetes mellitus, hypertension, dyslipidemia and coronary artery disease, who presented to the emergency room with acute onset of fever with associated generalized weakness and dyspnea without significant cough and wheezing.  She has been having orthopnea and paroxysmal nocturnal dyspnea as well as worsening lower extremity edema.  She admits to dyspnea on exertion.  She admitted to nausea and vomiting without abdominal pain.  On presentation febrile, chest x-ray with bilateral interstitial densities concerning for disease progression versus superimposed pneumonia. She was started on ceftriaxone and azithromycin.  She was feeling little better when seen this morning.  Nausea and vomiting has been improved.  She also has a history of frequent falls.  2 of her blood culture bottles are growing gram-positive cocci. MRSA swab was positive. Urine was never collected before getting antibiotics.  -Add empirical vancomycin-we will de-escalate antibiotics once more data becomes available. -Get PT/OT evaluation for frequent falls-recommending SNF placement.

## 2019-03-19 NOTE — Progress Notes (Signed)
Reported to Dr Reesa Chew MRSA culture positive .

## 2019-03-19 NOTE — Consult Note (Signed)
Pharmacy Antibiotic Note  Brooke Baker is a 85 y.o. female admitted on 03/18/2019 with sepsis. CXR impression of progression of lung disease versus superimposed pneumonia. Pharmacy has been consulted for vancomycin dosing. Patient is also on ceftriaxone and azithromycin. Patient was febrile to 103 on presentation which has resolved. No leukocytosis but WBC trending up. Procalcitonin 0.44.   Blood cultures with 2/4 bottles (same set) gram positive cocci.   Plan: Patient received vancomycin 1000 mg LD in ED.   Will give vancomycin 1250 mg x 1 to complete LD and account for time since first dose followed by maintenance regimen of   Vancomycin 750 mg IV Q 24 hrs. Goal AUC 400-550. Expected AUC: 455, trough 13.2 SCr used: 1.26  Scr trending up since admission (1.12 >> 1.26). Continue to follow daily Scr while on vancomycin.   Height: 5\' 6"  (167.6 cm) Weight: 162 lb (73.5 kg) IBW/kg (Calculated) : 59.3  Temp (24hrs), Avg:100 F (37.8 C), Min:97.7 F (36.5 C), Max:103 F (39.4 C)  Recent Labs  Lab 03/19/19 0036 03/19/19 0812  WBC 5.4 8.2  CREATININE 1.12* 1.26*  LATICACIDVEN 1.7 1.7    Estimated Creatinine Clearance: 35.3 mL/min (A) (by C-G formula based on SCr of 1.26 mg/dL (H)).    Allergies  Allergen Reactions  . Doxycycline Diarrhea and Nausea Only  . Methotrexate Rash    Antimicrobials this admission: Cefepime 3/14 x1 Vancomycin 3/14 >>  Ceftriaxone 3/14 >>  Azithromycin 3/14 >>  Dose adjustments this admission: n/a  Microbiology results: 3/14 BCx: 2/4 bottles (same set) [place] 3/14 UCx: pending 3/14 Sputum: pending 3/14 MRSA PCR: positive  Thank you for allowing pharmacy to be a part of this patient's care.  [place] Resident 03/19/2019 2:14 PM

## 2019-03-19 NOTE — ED Provider Notes (Signed)
[facility] Emergency Department Provider Note  ____________________________________________   First MD Initiated Contact with Patient 03/18/19 2326     (approximate)  I have reviewed the triage vital signs and the nursing notes.   HISTORY  Chief Complaint Shortness of Breath    HPI Brooke Baker is a 85 y.o. female with medical history as listed below which notably includes substantial chronic respiratory issues requiring 3.5 L of oxygen at all times.  She presents by EMS tonight for feeling generally ill for the last couple of days, starting mild but getting worse until tonight when she has had some nausea and some intermittent chest pain.  She reports her breathing is no worse than at baseline.  Her cough is no worse than baseline.  She did not know that she had a fever until coming to the emergency department.  She denies any dysuria.  She says that she has been eating well but never drinks much fluid.  She has had no abdominal pain but has had some persistent nausea and at least one episode of vomiting.  She reports the symptoms of generalized weakness and the nausea and vomiting tonight have been severe and nothing in particular makes it better or worse.  She is feeling a little bit better now although the nausea comes and goes.  Of note, she has had both COVID-19 vaccinations and in fact had her second vaccination about 4 to 5 days ago.         Past Medical History:  Diagnosis Date  . Acute bronchitis   . Allergic rhinitis   . Arthritis   . CAD (coronary artery disease)    s/p stent LAD 8/03 and stent placement OM1  . Cancer ([place])    skin  . Chronic back pain   . COPD with asthma ([place])   . CVA (cerebral vascular accident) ([place])   . Diabetes mellitus ([place])   . Hypercholesterolemia   . Hypertension   . Seizure disorder [facility])     Patient Active Problem List   Diagnosis Date Noted  . Sepsis ([place]) 03/19/2019  . Chronic respiratory failure  with hypoxia ([place]) 02/05/2019  . Anemia 01/14/2019  . Skin lesion 11/13/2018  . Leg lesion 11/13/2018  . Cough 09/25/2018  . Fall 09/25/2018  . Thrombocytopenia ([place]) 09/11/2018  . Hyperglycemia 09/11/2018  . ILD (interstitial lung disease) ([place]) 01/14/2018  . CHF (congestive heart failure) ([place]) 07/01/2017  . Therapeutic drug monitoring 03/25/2017  . BMI 39.0-39.9,adult 03/07/2017  . Falls frequently 03/18/2016  . Psoriatic arthritis ([place]) 05/14/2015  . Health care maintenance 10/31/2014  . Stress 12/03/2013  . Obesity 08/01/2013  . Neuropathy 08/01/2013  . Macrocytosis 08/01/2013  . Hypertension 12/18/2011  . Chronic back pain 12/18/2011  . Seizure disorder ([place]) 12/18/2011  . Atrial fibrillation ([place]) 12/18/2011  . Hypercholesterolemia 12/18/2011  . Asthma, moderate persistent 04/08/2009  . Coronary atherosclerosis 04/19/2007  . History of CVA (cerebrovascular accident) 04/19/2007  . ASTHMATIC BRONCHITIS, ACUTE 04/19/2007  . Seasonal and perennial allergic rhinitis 04/19/2007    Past Surgical History:  Procedure Laterality Date  . BREAST BIOPSY Bilateral    axillas  . CORONARY STENT PLACEMENT  8/03   LAD  . CORONARY STENT PLACEMENT     OM1  2006  . FEMUR FRACTURE SURGERY Right [id number]   Dr. Marry Guan  . LUMBAR SPINE SURGERY     x3  . TOTAL ABDOMINAL HYSTERECTOMY     appendectomy - age 40  . TOTAL  HIP ARTHROPLASTY    . TOTAL KNEE ARTHROPLASTY     2005    Prior to Admission medications   Medication Sig Start Date End Date Taking? Authorizing Provider  albuterol (PROVENTIL) (2.5 MG/3ML) 0.083% nebulizer solution USE ONE VIAL VIA NEBULZIER EVERY FOUR HOURS AS NEEDED FOR WHEEZING 12/08/18  Yes Einar Pheasant, MD  albuterol (VENTOLIN HFA) 108 (90 Base) MCG/ACT inhaler USE 2 PUFFS EVERY 4 HOURS AS DIRECTED - RESCUE 12/08/18  Yes Einar Pheasant, MD  amLODipine (NORVASC) 10 MG tablet TAKE 1 TABLET BY MOUTH DAILY 10/26/18  Yes Einar Pheasant, MD  aspirin 81 MG tablet Take  81 mg by mouth daily.     Yes [provider]  budesonide-formoterol (SYMBICORT) 80-4.5 MCG/ACT inhaler INHALE 2 PUFFS TWICE A DAY Patient taking differently: Inhale 2 puffs into the lungs 2 (two) times daily.  06/08/18  Yes Young, Tarri Fuller D, MD  dabigatran (PRADAXA) 150 MG CAPS Take 150 mg by mouth every 12 (twelve) hours.   Yes [provider]  fluticasone (FLONASE) 50 MCG/ACT nasal spray USE TWO SPRAYS IN EACH NOSTRIL EACH DAY AS DIRECTED BY PHYSICIAN 03/02/17  Yes Einar Pheasant, MD  furosemide (LASIX) 40 MG tablet Take 40 mg by mouth daily.   Yes [provider]  gabapentin (NEURONTIN) 100 MG capsule TAKE ONE CAPSULE BY MOUTH TWICE A DAY 03/10/19  Yes Einar Pheasant, MD  levETIRAcetam (KEPPRA) 750 MG tablet Take 1 tablet (750 mg total) by mouth 2 (two) times daily. 03/03/19  Yes Melvenia Beam, MD  losartan (COZAAR) 100 MG tablet TAKE 1 TABLET BY MOUTH DAILY Patient taking differently: Take 100 mg by mouth daily.  08/08/18  Yes Einar Pheasant, MD  metoprolol succinate (TOPROL-XL) 50 MG 24 hr tablet TAKE 1 TABLET BY MOUTH TWICE A DAY WITH OR IMMEDIATELY FOLLOWING A MEAL 12/08/18  Yes Einar Pheasant, MD  mupirocin ointment (BACTROBAN) 2 % Apply to affected area on leg twice a day 11/08/18  Yes Einar Pheasant, MD  nystatin-triamcinolone (MYCOLOG II) cream APPLY 1 APPLICATION TOPICALLY 2 TIMES DAILY TO BOTTOM 11/23/18  Yes Einar Pheasant, MD  Omega-3 Fatty Acids (FISH OIL) 1000 MG CAPS Take by mouth.   Yes [provider]  phenytoin (DILANTIN) 100 MG ER capsule Take 4 capsules (400 mg total) by mouth at bedtime. 03/03/19  Yes Melvenia Beam, MD  polyethylene glycol (MIRALAX / GLYCOLAX) 17 g packet Take 17 g by mouth daily.   Yes [provider]  potassium chloride (KLOR-CON) 10 MEQ tablet Take 1 tablet (10 mEq total) by mouth daily. 12/08/18  Yes Einar Pheasant, MD  rosuvastatin (CRESTOR) 20 MG tablet TAKE 1 TABLET BY MOUTH DAILY 03/10/19  Yes Einar Pheasant, MD  sertraline (ZOLOFT) 50 MG tablet TAKE 1 AND 1/2 TABLETS BY MOUTH ONCE A DAY. 01/09/19  Yes Einar Pheasant, MD    Allergies Doxycycline and Methotrexate  Family History  Problem Relation Age of Onset  . Allergies Brother   . Cancer Brother   . Allergies Sister   . Cancer Sister        Non-hodgkins lymphoma  . Asthma Sister   . Rheum arthritis Sister   . Stroke Mother   . Asthma Brother   . Rheum arthritis Brother   . Cancer Sister        non-hodgkins lymphoma  . Diabetes Brother     Social History Social History   Tobacco Use  . Smoking status: Never Smoker  . Smokeless tobacco: Never Used  Substance Use Topics  . Alcohol use: No    Alcohol/week: 0.0 standard drinks  . Drug use: No    Review of Systems Constitutional: +fever upon triage.  General malaise and generalized weakness. Eyes: No visual changes. ENT: No sore throat. Cardiovascular: Intermittent chest pain. Respiratory: Chronic shortness of breath. Gastrointestinal: Some nausea and vomiting, no abdominal pain.  No diarrhea. Genitourinary: Negative for dysuria. Musculoskeletal: Negative for neck pain.  Negative for back pain. Integumentary: Negative for rash. Neurological: Negative for headaches, focal weakness or numbness.   ____________________________________________   PHYSICAL EXAM:  VITAL SIGNS: ED Triage Vitals  Enc Vitals Group     BP 03/18/19 2313 (!) 149/66     Pulse Rate 03/18/19 2324 (!) 117     Resp 03/18/19 2324 (!) 25     Temp 03/18/19 2323 (!) 103 F (39.4 C)     Temp src --      SpO2 03/18/19 2324 97 %     Weight 03/18/19 2317 73.5 kg (162 lb)     Height 03/18/19 2317 1.676 m (5\' 6" )     Head Circumference --      Peak Flow --      Pain Score 03/18/19 2316 0     Pain Loc --      Pain Edu? --      Excl. in [place]? --     Constitutional: Alert and oriented.  Breathing easily and speaking in full sentences. Eyes: Conjunctivae are normal.  Head: Atraumatic. Nose:  No congestion/rhinnorhea. Mouth/Throat: Patient is wearing a mask. Neck: No stridor.  No meningeal signs.   Cardiovascular: Mild tachycardia, regular rhythm. Good peripheral circulation. Grossly normal heart sounds. Respiratory: Normal respiratory effort.  No retractions.  Some coarse breath sounds, no significant wheezing.  Patient is on her chronic oxygen. Gastrointestinal: Soft and nondistended.  Mild diffuse tenderness throughout the abdomen with no focal tenderness. Musculoskeletal: No lower extremity tenderness nor edema. No gross deformities of extremities. Neurologic:  Normal speech and language. No gross focal neurologic deficits are appreciated.  Skin:  Skin is warm, dry and intact. Psychiatric: Mood and affect are normal. Speech and behavior are normal.  ____________________________________________   LABS (all labs ordered are listed, but only abnormal results are displayed)  Labs Reviewed  COMPREHENSIVE METABOLIC PANEL - Abnormal; Notable for the following components:      Result Value   CO2 21 (*)    Glucose, Bld 126 (*)    Creatinine, Ser 1.12 (*)    Calcium 7.5 (*)    Albumin 2.4 (*)    Total Bilirubin 1.5 (*)    GFR calc non Af Amer 46 (*)    GFR calc Af Amer 53 (*)    All other components within normal limits  BRAIN NATRIURETIC PEPTIDE - Abnormal; Notable for the following components:   B Natriuretic Peptide 608.0 (*)    All other components within normal limits  CBC WITH DIFFERENTIAL/PLATELET - Abnormal; Notable for the following components:   RBC 2.58 (*)    Hemoglobin 8.8 (*)    HCT 28.2 (*)    MCV 109.3 (*)    MCH 34.1 (*)    Platelets 83 (*)    Lymphs Abs 0.2 (*)    All other components within normal limits  PROTIME-INR - Abnormal; Notable for the following components:   Prothrombin Time 20.2 (*)    INR 1.7 (*)    All other components within normal limits  RESPIRATORY PANEL BY RT PCR (FLU  A&B, COVID)  CULTURE, BLOOD (ROUTINE X 2)  CULTURE, BLOOD  (ROUTINE X 2)  URINE CULTURE  LACTIC ACID, PLASMA  LIPASE, BLOOD  PROCALCITONIN  LACTIC ACID, PLASMA  URINALYSIS, COMPLETE (UACMP) WITH MICROSCOPIC  POC SARS CORONAVIRUS 2 AG -  ED  POC SARS CORONAVIRUS 2 AG   ____________________________________________  EKG  ED ECG REPORT I, Hinda Kehr, the attending physician, personally viewed and interpreted this ECG.  Date: 03/18/2019 EKG Time: 23: 19 Rate: 117 Rhythm: A. fib with RVR (mild tachycardia) QRS Axis: Left axis deviation Intervals: Normal except for A. fib ST/T Wave abnormalities: Non-specific ST segment / T-wave changes, but no clear evidence of acute ischemia. Narrative Interpretation: no definitive evidence of acute ischemia; does not meet STEMI criteria.   ____________________________________________  RADIOLOGY I, Hinda Kehr, personally viewed and evaluated these images (plain radiographs) as part of my medical decision making, as well as reviewing the written report by the radiologist.  ED MD interpretation:  Chronic interstitial disease vs developing pneumonia.  Official radiology report(s): DG Chest Port 1 View  Result Date: 03/18/2019 CLINICAL DATA:  85 year old female with sepsis. EXAM: PORTABLE CHEST 1 VIEW COMPARISON:  Chest radiograph dated 04/20/2017. FINDINGS: There is chronic interstitial coarsening and bronchitic changes. Overall slight interval progression of interstitial densities since the prior radiograph which may represent progression of background of lung disease although superimposed pneumonia is not excluded. Clinical correlation is recommended. No focal consolidation or pneumothorax. Trace bilateral pleural effusions may be present. There is mild cardiomegaly. Atherosclerotic calcification of the aorta. No acute osseous pathology. IMPRESSION: Slight interval progression of interstitial densities may represent progression of lung disease versus superimposed pneumonia. Electronically Signed   By:  Anner Crete M.D.   On: 03/18/2019 23:43    ____________________________________________   PROCEDURES   Procedure(s) performed (including Critical Care):  .Critical Care Performed by: Hinda Kehr, MD Authorized by: Hinda Kehr, MD   Critical care provider statement:    Critical care time (minutes):  45   Critical care time was exclusive of:  Separately billable procedures and treating other patients   Critical care was necessary to treat or prevent imminent or life-threatening deterioration of the following conditions:  Sepsis   Critical care was time spent personally by me on the following activities:  Development of treatment plan with patient or surrogate, discussions with consultants, evaluation of patient's response to treatment, examination of patient, obtaining history from patient or surrogate, ordering and performing treatments and interventions, ordering and review of laboratory studies, ordering and review of radiographic studies, pulse oximetry, re-evaluation of patient's condition and review of old charts .1-3 Lead EKG Interpretation Performed by: Hinda Kehr, MD Authorized by: Hinda Kehr, MD     Interpretation: normal     ECG rate:  94   ECG rate assessment: normal     Rhythm: atrial fibrillation     Ectopy: none     Conduction: normal       ____________________________________________   INITIAL IMPRESSION / MDM / ASSESSMENT AND PLAN / ED COURSE  As part of my medical decision making, I reviewed the following data within the [place] notes reviewed and incorporated, Labs reviewed , EKG interpreted , Old chart reviewed, Discussed with admitting physician  and Notes from prior ED visits   Differential diagnosis includes, but is not limited to, COVID-19, community-acquired pneumonia, COPD exacerbation, acute intra-abdominal infection, urinary tract infection.  The patient meets SIRS criteria based on her temperature and her  tachycardia.  Respiratory rate is also elevated.  Oxygen saturation is stable on her chronic O2.  It is very difficult to obtain IV access on her and the IV team has been consulted.  She has a small 22-gauge peripheral IV but no blood was able to be obtained.  Given a worsening pattern on her chest x-ray and the fact that she has received both of her COVID-19 vaccinations, I am obtaining the rapid point-of-care COVID-19 test but I will initiate code sepsis including IV fluids and empiric antibiotics..  Patient is not in severe respiratory distress at this time in spite of her chronic issues.      Clinical Course as of Mar 18 313  Sun Mar 19, 2019  0126 Lactic Acid, Venous: 1.7 [CF]  0147 elevated, likely consistent with bacterial infection  Procalcitonin: 0.44 [CF]  [id number] Discussed case by phone with Dr. Sidney Ace who will admit.   [CF]    Clinical Course User Index [CF] Hinda Kehr, MD     ____________________________________________  FINAL CLINICAL IMPRESSION(S) / ED DIAGNOSES  Final diagnoses:  Sepsis, due to unspecified organism, unspecified whether acute organ dysfunction present [facility])  Community acquired pneumonia, unspecified laterality  Chronic respiratory failure, unspecified whether with hypoxia or hypercapnia ([place])     MEDICATIONS GIVEN DURING THIS VISIT:  Medications  lactated ringers bolus 1,000 mL (1,000 mLs Intravenous New Bag/Given 03/19/19 0052)  vancomycin (VANCOREADY) IVPB 750 mg/150 mL (has no administration in time range)  vancomycin (VANCOCIN) IVPB 1000 mg/200 mL premix (1,000 mg Intravenous New Bag/Given 03/19/19 0053)  ceFEPIme (MAXIPIME) 2 g in sodium chloride 0.9 % 100 mL IVPB (0 g Intravenous Stopped 03/19/19 0126)  acetaminophen (TYLENOL) tablet 1,000 mg (1,000 mg Oral Given 03/19/19 0049)     ED Discharge Orders    None      *Please note:  LAILEY ZURLO was evaluated in Emergency Department on 03/19/2019 for the symptoms described in the history of  present illness. She was evaluated in the context of the global COVID-19 pandemic, which necessitated consideration that the patient might be at risk for infection with the SARS-CoV-2 virus that causes COVID-19. Institutional protocols and algorithms that pertain to the evaluation of patients at risk for COVID-19 are in a state of rapid change based on information released by regulatory bodies including the CDC and federal and state organizations. These policies and algorithms were followed during the patient's care in the ED.  Some ED evaluations and interventions may be delayed as a result of limited staffing during the pandemic.*  Note:  This document was prepared using Dragon voice recognition software and may include unintentional dictation errors.   Hinda Kehr, MD 03/19/19 [phone]

## 2019-03-19 NOTE — Progress Notes (Signed)
Primary RN notified of patients unwitnessed fall at approximately 2105. Patient had reported the previous night that she had not stood or walked since the previous year, September 2020, due to a fall she sustained at home. Since then, she had been wheelchair/recliner bound.   This RN placed a PureWick on patient during admission, and was under the impression this had been continued. Patient did not have PureWick on, and had tried to get back to bed from bathroom alone.   Patient does report hitting head on floor, NP notified.   NAD noted, VSS hypertensive but stable.   Will monitor vital signs every two hours for remainder of shift and continue to implement high fall risk protocol on patient.   Randol Kern, NP paged at 2110.

## 2019-03-19 NOTE — Progress Notes (Signed)
PHARMACY -  BRIEF ANTIBIOTIC NOTE   Pharmacy has received consult(s) for vancomycin from an ED provider.  The patient's profile has been reviewed for ht/wt/allergies/indication/available labs.    One time order(s) placed for vancomycin 1.75g IV load  Further antibiotics/pharmacy consults should be ordered by admitting physician if indicated.                       Thank you,  Tobie Lords, PharmD, BCPS Clinical Pharmacist 03/19/2019  1:05 AM

## 2019-03-20 ENCOUNTER — Inpatient Hospital Stay
Admit: 2019-03-20 | Discharge: 2019-03-20 | Disposition: A | Discharge status: Home / Self Care | Payer: Medicare PPO | Attending: Internal Medicine | Admitting: Internal Medicine

## 2019-03-20 DIAGNOSIS — J189 Pneumonia, unspecified organism: Secondary | ICD-10-CM

## 2019-03-20 DIAGNOSIS — J9610 Chronic respiratory failure, unspecified whether with hypoxia or hypercapnia: Secondary | ICD-10-CM

## 2019-03-20 DIAGNOSIS — D539 Nutritional anemia, unspecified: Secondary | ICD-10-CM

## 2019-03-20 DIAGNOSIS — D696 Thrombocytopenia, unspecified: Secondary | ICD-10-CM

## 2019-03-20 DIAGNOSIS — A419 Sepsis, unspecified organism: Secondary | ICD-10-CM

## 2019-03-20 DIAGNOSIS — D649 Anemia, unspecified: Secondary | ICD-10-CM

## 2019-03-20 DIAGNOSIS — J9611 Chronic respiratory failure with hypoxia: Secondary | ICD-10-CM

## 2019-03-20 DIAGNOSIS — J188 Other pneumonia, unspecified organism: Secondary | ICD-10-CM

## 2019-03-20 LAB — RETICULOCYTES
Immature Retic Fract: 11.7 % (ref 2.3–15.9)
RBC.: 2.19 MIL/uL — ABNORMAL LOW (ref 3.87–5.11)
Retic Count, Absolute: 44.5 10*3/uL (ref 19.0–186.0)
Retic Ct Pct: 2 % (ref 0.4–3.1)

## 2019-03-20 LAB — C-REACTIVE PROTEIN: CRP: 21.2 mg/dL — ABNORMAL HIGH (ref <1.0)

## 2019-03-20 LAB — CBC
HCT: 25.3 % — ABNORMAL LOW (ref 36.0–46.0)
Hemoglobin: 8.1 g/dL — ABNORMAL LOW (ref 12.0–15.0)
MCH: 34.3 pg — ABNORMAL HIGH (ref 26.0–34.0)
MCHC: 32 g/dL (ref 30.0–36.0)
MCV: 107.2 fL — ABNORMAL HIGH (ref 80.0–100.0)
Platelets: 68 10*3/uL — ABNORMAL LOW (ref 150–400)
RBC: 2.36 MIL/uL — ABNORMAL LOW (ref 3.87–5.11)
RDW: 15.2 % (ref 11.5–15.5)
WBC: 5 10*3/uL (ref 4.0–10.5)
nRBC: 0 % (ref 0.0–0.2)

## 2019-03-20 LAB — IRON AND TIBC
Iron: 32 ug/dL (ref 28–170)
Saturation Ratios: 19 % (ref 10.4–31.8)
TIBC: 165 ug/dL — ABNORMAL LOW (ref 250–450)
UIBC: 133 ug/dL

## 2019-03-20 LAB — BASIC METABOLIC PANEL
Anion gap: 8 (ref 5–15)
BUN: 26 mg/dL — ABNORMAL HIGH (ref 8–23)
CO2: 20 mmol/L — ABNORMAL LOW (ref 22–32)
Calcium: 7.4 mg/dL — ABNORMAL LOW (ref 8.9–10.3)
Chloride: 105 mmol/L (ref 98–111)
Creatinine, Ser: 1.24 mg/dL — ABNORMAL HIGH (ref 0.44–1.00)
GFR calc Af Amer: 47 mL/min — ABNORMAL LOW (ref >60)
GFR calc non Af Amer: 40 mL/min — ABNORMAL LOW (ref >60)
Glucose, Bld: 117 mg/dL — ABNORMAL HIGH (ref 70–99)
Potassium: 4.4 mmol/L (ref 3.5–5.1)
Sodium: 133 mmol/L — ABNORMAL LOW (ref 135–145)

## 2019-03-20 LAB — VITAMIN B12: Vitamin B-12: 859 pg/mL (ref 180–914)

## 2019-03-20 LAB — GLUCOSE, CAPILLARY
Glucose-Capillary: 71 mg/dL (ref 70–99)
Glucose-Capillary: 92 mg/dL (ref 70–99)
Glucose-Capillary: 89 mg/dL (ref 70–99)
Glucose-Capillary: 81 mg/dL (ref 70–99)

## 2019-03-20 LAB — ECHOCARDIOGRAM COMPLETE
Height: 66 in
Weight: 2592 oz

## 2019-03-20 LAB — FOLATE: Folate: 4.4 ng/mL — ABNORMAL LOW (ref >5.9)

## 2019-03-20 LAB — IMMATURE PLATELET FRACTION: Immature Platelet Fraction: 4.9 % (ref 1.2–8.6)

## 2019-03-20 LAB — FERRITIN: Ferritin: 168 ng/mL (ref 11–307)

## 2019-03-20 MED ORDER — AZITHROMYCIN 250 MG PO TABS
500.0000 mg | ORAL_TABLET | Freq: Every day | ORAL | Status: AC
  Administered 2019-03-21 – 2019-03-23 (×3): 500 mg via ORAL
  Filled 2019-03-20 (×3): qty 2

## 2019-03-20 MED ORDER — ASPIRIN EC 81 MG PO TBEC
81.0000 mg | DELAYED_RELEASE_TABLET | Freq: Every day | ORAL | Status: DC
  Administered 2019-03-21 – 2019-03-29 (×9): 81 mg via ORAL
  Filled 2019-03-20 (×10): qty 1

## 2019-03-20 MED ORDER — FOLIC ACID 1 MG PO TABS
1.0000 mg | ORAL_TABLET | Freq: Every day | ORAL | Status: AC
  Administered 2019-03-20 – 2019-03-29 (×10): 1 mg via ORAL
  Filled 2019-03-20 (×10): qty 1

## 2019-03-20 NOTE — Consult Note (Signed)
Cardiology Consultation Note    Patient ID: Brooke Baker, MRN: [id number], DOB/AGE: February 20, 1934 85 y.o. Admit date: 03/18/2019   Date of Consult: 03/20/2019 Primary Physician: Einar Pheasant, MD Primary Cardiologist: Dr. Saralyn Pilar  Chief Complaint: sob Reason for Consultation: sepsis Requesting MD: Dr. Lorella Nimrod  HPI: Brooke Baker is a 85 y.o. female with history of coronary artery disease status post PCI of the proximal LAD in 2003, status post PCI of an OM1 in 2006, history of paroxysmal atrial fibrillation treated with anticoagulation with Pradaxa, hypertension, hyperlipidemia, history of TIAs, history of diastolic congestive heart failure [place] heart association class II-III ACC/AHA stage C, HFpEF with an EF of 55%.  Echocardiogram in 2018 showed an EF of 50 to 55% with moderate TR mild MR and pulmonary regurgitation with mild pulmonary hypertension.  This was unchanged from 2012.  She presented to emergency room complaining of shortness of breath.  She was hemodynamically stable but had a temperature of 103 with a heart rate of 117.  Pulse ox of 97% on room air.  Laboratory showed a CO2 of 21 BUN and creatinine of 19 and 1.12 respectively.  Her BNP was 608.  Hemoglobin was 8.8 with hematocrit of 28.2 and a platelet count of 83,000.  EKG showed atrial fibrillation with rapid ventricular response.  1 of 2 blood cultures showed Staph aureus.  Chest x-ray showed interval progression of interstitial densities consistent with progression of lung disease versus pneumonia.  She had a functional study done in January 2021 showing no evidence of ischemia with preserved LV function.  Subsequent platelet count of 68,000.  No obvious bleeding although the patient's hemoglobin is 8.1 down from 8.8 on admission.  Patient's CHA2DS2-VASc score is 7. Past Medical History:  Diagnosis Date  . Acute bronchitis   . Allergic rhinitis   . Arthritis   . CAD (coronary artery disease)    s/p stent LAD  8/03 and stent placement OM1  . Cancer ([place])    skin  . Chronic back pain   . COPD with asthma ([place])   . CVA (cerebral vascular accident) ([place])   . Diabetes mellitus ([place])   . Hypercholesterolemia   . Hypertension   . Seizure disorder [facility])       Surgical History:  Past Surgical History:  Procedure Laterality Date  . BREAST BIOPSY Bilateral    axillas  . CORONARY STENT PLACEMENT  8/03   LAD  . CORONARY STENT PLACEMENT     OM1  2006  . FEMUR FRACTURE SURGERY Right [id number]   Dr. Marry Guan  . LUMBAR SPINE SURGERY     x3  . TOTAL ABDOMINAL HYSTERECTOMY     appendectomy - age 46  . TOTAL HIP ARTHROPLASTY    . TOTAL KNEE ARTHROPLASTY     2005     Home Meds: Prior to Admission medications   Medication Sig Start Date End Date Taking? Authorizing Provider  albuterol (PROVENTIL) (2.5 MG/3ML) 0.083% nebulizer solution USE ONE VIAL VIA NEBULZIER EVERY FOUR HOURS AS NEEDED FOR WHEEZING 12/08/18  Yes Einar Pheasant, MD  albuterol (VENTOLIN HFA) 108 (90 Base) MCG/ACT inhaler USE 2 PUFFS EVERY 4 HOURS AS DIRECTED - RESCUE 12/08/18  Yes Einar Pheasant, MD  amLODipine (NORVASC) 10 MG tablet TAKE 1 TABLET BY MOUTH DAILY 10/26/18  Yes Einar Pheasant, MD  aspirin 81 MG tablet Take 81 mg by mouth daily.     Yes [provider]  budesonide-formoterol (SYMBICORT) 80-4.5 MCG/ACT inhaler INHALE 2  PUFFS TWICE A DAY Patient taking differently: Inhale 2 puffs into the lungs 2 (two) times daily.  06/08/18  Yes Young, Tarri Fuller D, MD  dabigatran (PRADAXA) 150 MG CAPS Take 150 mg by mouth every 12 (twelve) hours.   Yes [provider]  fluticasone (FLONASE) 50 MCG/ACT nasal spray USE TWO SPRAYS IN EACH NOSTRIL EACH DAY AS DIRECTED BY PHYSICIAN 03/02/17  Yes Einar Pheasant, MD  furosemide (LASIX) 40 MG tablet Take 40 mg by mouth daily.   Yes [provider]  gabapentin (NEURONTIN) 100 MG capsule TAKE ONE CAPSULE BY MOUTH TWICE A DAY 03/10/19  Yes Einar Pheasant, MD  levETIRAcetam  (KEPPRA) 750 MG tablet Take 1 tablet (750 mg total) by mouth 2 (two) times daily. 03/03/19  Yes Melvenia Beam, MD  losartan (COZAAR) 100 MG tablet TAKE 1 TABLET BY MOUTH DAILY Patient taking differently: Take 100 mg by mouth daily.  08/08/18  Yes Einar Pheasant, MD  metoprolol succinate (TOPROL-XL) 50 MG 24 hr tablet TAKE 1 TABLET BY MOUTH TWICE A DAY WITH OR IMMEDIATELY FOLLOWING A MEAL 12/08/18  Yes Einar Pheasant, MD  mupirocin ointment (BACTROBAN) 2 % Apply to affected area on leg twice a day 11/08/18  Yes Einar Pheasant, MD  nystatin-triamcinolone (MYCOLOG II) cream APPLY 1 APPLICATION TOPICALLY 2 TIMES DAILY TO BOTTOM 11/23/18  Yes Einar Pheasant, MD  Omega-3 Fatty Acids (FISH OIL) 1000 MG CAPS Take by mouth.   Yes [provider]  phenytoin (DILANTIN) 100 MG ER capsule Take 4 capsules (400 mg total) by mouth at bedtime. 03/03/19  Yes Melvenia Beam, MD  polyethylene glycol (MIRALAX / GLYCOLAX) 17 g packet Take 17 g by mouth daily.   Yes [provider]  potassium chloride (KLOR-CON) 10 MEQ tablet Take 1 tablet (10 mEq total) by mouth daily. 12/08/18  Yes Einar Pheasant, MD  rosuvastatin (CRESTOR) 20 MG tablet TAKE 1 TABLET BY MOUTH DAILY 03/10/19  Yes Einar Pheasant, MD  sertraline (ZOLOFT) 50 MG tablet TAKE 1 AND 1/2 TABLETS BY MOUTH ONCE A DAY. 01/09/19  Yes Einar Pheasant, MD    Inpatient Medications:  . aspirin EC  81 mg Oral Daily  . [START ON 03/21/2019] azithromycin  500 mg Oral Daily  . Chlorhexidine Gluconate Cloth  6 each Topical Daily  . dabigatran  150 mg Oral Q12H  . fluticasone  2 spray Each Nare Daily  . gabapentin  100 mg Oral BID  . insulin aspart  0-9 Units Subcutaneous TID PC & HS  . levETIRAcetam  750 mg Oral BID  . metoprolol succinate  50 mg Oral Daily  . mupirocin ointment   Topical BID  . nystatin-triamcinolone   Topical BID  . omega-3 acid ethyl esters  1 g Oral Daily  . phenytoin  400 mg Oral QHS  . polyethylene glycol  17 g Oral Daily   . rosuvastatin  20 mg Oral Daily  . sertraline  75 mg Oral Daily  . sodium chloride flush  10-40 mL Intracatheter Q12H   . vancomycin      Allergies:  Allergies  Allergen Reactions  . Doxycycline Diarrhea and Nausea Only  . Methotrexate Rash    Social History   Socioeconomic History  . Marital status: Married    Spouse name: Not on file  . Number of children: 3  . Years of education: hs  . Highest education level: Not on file  Occupational History  . Occupation: retired Psychologist, sport and exercise: RETIRED  Tobacco Use  . Smoking status: Never Smoker  . Smokeless tobacco: Never Used  Substance and Sexual Activity  . Alcohol use: No    Alcohol/week: 0.0 standard drinks  . Drug use: No  . Sexual activity: Never  Other Topics Concern  . Not on file  Social History Narrative   lives with husband- Edsel   Social Determinants of Health   Financial Resource Strain:   . Difficulty of Paying Living Expenses:   Food Insecurity:   . Worried About Charity fundraiser in the Last Year:   . Arboriculturist in the Last Year:   Transportation Needs:   . Film/video editor (Medical):   [name] Lack of Transportation (Non-Medical):   Physical Activity:   . Days of Exercise per Week:   . Minutes of Exercise per Session:   Stress:   . Feeling of Stress :   Social Connections:   . Frequency of Communication with Friends and Family:   . Frequency of Social Gatherings with Friends and Family:   . Attends Religious Services:   . Active Member of Clubs or Organizations:   . Attends Archivist Meetings:   [name] Marital Status:   Intimate Partner Violence:   . Fear of Current or Ex-Partner:   . Emotionally Abused:   [name] Physically Abused:   . Sexually Abused:      Family History  Problem Relation Age of Onset  . Allergies Brother   . Cancer Brother   . Allergies Sister   . Cancer Sister        Non-hodgkins lymphoma  . Asthma Sister   . Rheum arthritis Sister   .  Stroke Mother   . Asthma Brother   . Rheum arthritis Brother   . Cancer Sister        non-hodgkins lymphoma  . Diabetes Brother      Review of Systems: A 12-system review of systems was performed and is negative except as noted in the HPI.  Labs: No results for input(s): CKTOTAL, CKMB, TROPONINI in the last 72 hours. Lab Results  Component Value Date   WBC 5.0 03/20/2019   HGB 8.1 (L) 03/20/2019   HCT 25.3 (L) 03/20/2019   MCV 107.2 (H) 03/20/2019   PLT 68 (L) 03/20/2019    Recent Labs  Lab 03/19/19 0036 03/19/19 0812 03/20/19 0450  NA 135   < > 133*  K 4.0   < > 4.4  CL 106   < > 105  CO2 21*   < > 20*  BUN 19   < > 26*  CREATININE 1.12*   < > 1.24*  CALCIUM 7.5*   < > 7.4*  PROT 7.4  --   --   BILITOT 1.5*  --   --   ALKPHOS 108  --   --   ALT 10  --   --   AST 31  --   --   GLUCOSE 126*   < > 117*   < > = values in this interval not displayed.   Lab Results  Component Value Date   CHOL 97 01/19/2019   HDL 31.80 (L) 01/19/2019   LDLCALC 50 01/19/2019   TRIG 77.0 01/19/2019   No results found for: DDIMER  Radiology/Studies:  CT HEAD WO CONTRAST  Result Date: 03/19/2019 CLINICAL DATA:  85 year old female with fall. EXAM: CT HEAD WITHOUT CONTRAST TECHNIQUE: Contiguous axial images were obtained from the base of the skull through  the vertex without intravenous contrast. COMPARISON:  Head CT dated 09/08/2018. FINDINGS: Brain: There is moderate age-related atrophy and chronic microvascular ischemic changes. There is no acute intracranial hemorrhage. No mass effect or midline shift. No extra-axial fluid collection. Vascular: No hyperdense vessel or unexpected calcification. Skull: Normal. Negative for fracture or focal lesion. Sinuses/Orbits: There is opacification of the left frontal sinuses and maxillary sinuses with remodeling of the medial wall of the right maxillary sinus. Opacification of the left sphenoid sinuses. No air-fluid level. The mastoid air cells are  clear. Other: None IMPRESSION: 1. No acute intracranial pathology. 2. Age-related atrophy and chronic microvascular ischemic changes. 3. Chronic paranasal sinus disease. Electronically Signed   By: Anner Crete M.D.   On: 03/19/2019 22:31   DG Chest Port 1 View  Result Date: 03/18/2019 CLINICAL DATA:  85 year old female with sepsis. EXAM: PORTABLE CHEST 1 VIEW COMPARISON:  Chest radiograph dated 04/20/2017. FINDINGS: There is chronic interstitial coarsening and bronchitic changes. Overall slight interval progression of interstitial densities since the prior radiograph which may represent progression of background of lung disease although superimposed pneumonia is not excluded. Clinical correlation is recommended. No focal consolidation or pneumothorax. Trace bilateral pleural effusions may be present. There is mild cardiomegaly. Atherosclerotic calcification of the aorta. No acute osseous pathology. IMPRESSION: Slight interval progression of interstitial densities may represent progression of lung disease versus superimposed pneumonia. Electronically Signed   By: Anner Crete M.D.   On: 03/18/2019 23:43    Wt Readings from Last 3 Encounters:  03/18/19 73.5 kg  01/26/19 104.3 kg  11/08/18 104.3 kg    EKG: Atrial fibrillation with rapid ventricular response  Physical Exam:  Blood pressure (!) 114/57, pulse 93, temperature 98.2 F (36.8 C), resp. rate 19, height 5\' 6"  (1.676 m), weight 73.5 kg, last menstrual period 08/17/1965, SpO2 99 %. Body mass index is 26.15 kg/m. General: Well developed, well nourished, in no acute distress. Head: Normocephalic, atraumatic, sclera non-icteric, no xanthomas, nares are without discharge.  Neck: Negative for carotid bruits. JVD not elevated. Lungs: Labored respirations with wheezing and rhonchi bilaterally Heart: Irregular regular rhythm Abdomen: Soft, non-tender, non-distended with normoactive bowel sounds. No hepatomegaly. No rebound/guarding. No  obvious abdominal masses. Msk:  Strength and tone appear normal for age. Extremities: No clubbing or cyanosis. No edema.  Distal pedal pulses are 2+ and equal bilaterally. Neuro: Alert and oriented X 3. No facial asymmetry. No focal deficit. Moves all extremities spontaneously. Psych:  Responds to questions appropriately with a normal affect.     Assessment and Plan  85 year old female with history of coronary artery disease status post PCI of the proximal LAD in 2003, status post PCI of an OM1 in 2006, history of paroxysmal atrial fibrillation treated with anticoagulation with Pradaxa, hypertension, hyperlipidemia, history of TIAs, history of diastolic congestive heart failure [place] heart association class II-III ACC/AHA stage C, HFpEF with an EF of 55%.  Echocardiogram in 2018 showed an EF of 50 to 55% with moderate TR mild MR and pulmonary regurgitation with mild pulmonary hypertension.  This was unchanged from 2012.  She presented to emergency room complaining of shortness of breath.  She was hemodynamically stable but had a temperature of 103 with a heart rate of 117.  Pulse ox of 97% on room air.  Laboratory showed a CO2 of 21 BUN and creatinine of 19 and 1.12 respectively.  Her BNP was 608.  Hemoglobin was 8.8 with hematocrit of 28.2 and a platelet count of 83,000.  EKG  showed atrial fibrillation with rapid ventricular response.  1 of 2 blood cultures showed Staph aureus.  Chest x-ray showed interval progression of interstitial densities consistent with progression of lung disease versus pneumonia.  She had a functional study done in January 2021 showing no evidence of ischemia with preserved LV function.  Subsequent platelet count of 68,000.  No obvious bleeding although the patient's hemoglobin is 8.1 down from 8.8 on admission.  Patient's CHA2DS2-VASc score is 7.  Septicemia-transthoracic echo is pending.  Patient has staph aureus in 1 of 2 blood cultures.  Will review transthoracic echo when  available to evaluate valves.  Discussion regarding transesophageal echo was carried out.  Patient is somewhat high risk for this procedure given her bronchospasm noted today.  She also has relative rest due to thrombocytopenia which has worsened since January when she was 116,000 now down to 68,000.  Would continue with antibiotics and consider transesophageal echo when pulmonary status is stable to allow sedation and esophageal intubation safely.  Thrombocytopenia-etiology unclear.  Has worsened since admission.  Patient's CHA2DS2-VASc score is quite high however consideration for holding Pradaxa at least temporarily appears appropriate.  Consider hematologic work-up to determine source of worsening thrombocytopenia.  HFpEF-EF normal by echo done recently.  Also normal by functional study done in January.  There was no evidence of ischemia at that time.  We will continue with careful follow-up.  Patient does not appear to be in florid pulmonary edema at present  Coronary artery disease-status post PCI as mentioned above.  Does not appear ischemic at present.  We will continue to follow.  We will continue aspirin for now however this may need to be addressed as platelet count varies.  Signed, Teodoro Spray MD 03/20/2019, 1:43 PM Pager: [phone]

## 2019-03-20 NOTE — Progress Notes (Signed)
OT Cancellation Note  Patient Details Name: Brooke Baker MRN: [id number] DOB: 1934-11-20   Cancelled Treatment:    Reason Eval/Treat Not Completed: Other (comment);Fatigue/lethargy limiting ability to participate  Spoke to nurse this AM concerning OT evaluation.  Per chart, lab values such as Hgb and HCT are low for guidelines of activity at this time.  Nurse also states that patient had an unwitnessed fall last night and is doing poorly today.  Will follow up as able and appropriate.  Thank you.  Oren Binet 03/20/2019, 1:54 PM

## 2019-03-20 NOTE — Consult Note (Signed)
Infectious Disease     Reason for Consult: Staph bactermia   Referring Physician: Dr Rometta Emery Date of Admission:  03/18/2019   Active Problems:   AF (paroxysmal atrial fibrillation) (HCC)   Macrocytic anemia   Chronic respiratory failure ([place])   Sepsis ([place])   Community acquired pneumonia   HPI: Brooke Baker is a 85 y.o. female with history of coronary artery disease, history of paroxysmal atrial fibrillation on Pradaxa, hypertension, hyperlipidemia,  TIAs, history of diastolic congestive heart failure. Admitted complaining of shortness of breath.  ON admit she was hemodynamically stable but had a temperature of 103 with a heart rate of 117.  Pulse ox of 97% on room air.  Bay with staph aureus.  She has psoriasis and has LE chronic scaly lesions but her and daughter deny any recent abscesses or cellulitis. 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 She Does co acute on chronic worsening of LBP.   Past Medical History:  Diagnosis Date  . Acute bronchitis   . Allergic rhinitis   . Arthritis   . CAD (coronary artery disease)    s/p stent LAD 8/03 and stent placement OM1  . Cancer ([place])    skin  . Chronic back pain   . COPD with asthma ([place])   . CVA (cerebral vascular accident) ([place])   . Diabetes mellitus ([place])   . Hypercholesterolemia   . Hypertension   . Seizure disorder [facility])    Past Surgical History:  Procedure Laterality Date  . BREAST BIOPSY Bilateral    axillas  . CORONARY STENT PLACEMENT  8/03   LAD  . CORONARY STENT PLACEMENT     OM1  2006  . FEMUR FRACTURE SURGERY Right [id number]   Dr. Marry Guan  . LUMBAR SPINE SURGERY     x3  . TOTAL ABDOMINAL HYSTERECTOMY     appendectomy - age 54  . TOTAL HIP ARTHROPLASTY    . TOTAL KNEE ARTHROPLASTY     2005   Social History   Tobacco Use  . Smoking status: Never Smoker  . Smokeless tobacco: Never Used  Substance Use Topics  . Alcohol use: No    Alcohol/week: 0.0 standard drinks  .  Drug use: No   Family History  Problem Relation Age of Onset  . Allergies Brother   . Cancer Brother   . Allergies Sister   . Cancer Sister        Non-hodgkins lymphoma  . Asthma Sister   . Rheum arthritis Sister   . Stroke Mother   . Asthma Brother   . Rheum arthritis Brother   . Cancer Sister        non-hodgkins lymphoma  . Diabetes Brother     Allergies:  Allergies  Allergen Reactions  . Doxycycline Diarrhea and Nausea Only  . Methotrexate Rash    Current antibiotics: Antibiotics Given (last 72 hours)    Date/Time Action Medication Dose Rate   03/19/19 0050 New Bag/Given   ceFEPIme (MAXIPIME) 2 g in sodium chloride 0.9 % 100 mL IVPB 2 g 200 mL/hr   03/19/19 0053 New Bag/Given   vancomycin (VANCOCIN) IVPB 1000 mg/200 mL premix 1,000 mg 200 mL/hr   03/19/19 0521 New Bag/Given   azithromycin (ZITHROMAX) 500 mg in sodium chloride 0.9 % 250 mL IVPB 500 mg 250 mL/hr   03/19/19 0912 New Bag/Given   cefTRIAXone (ROCEPHIN) 2 g in sodium chloride 0.9 % 100 mL IVPB 2 g 200  mL/hr   03/19/19 1623 New Bag/Given   vancomycin (VANCOREADY) IVPB 1250 mg/250 mL 1,250 mg 166.7 mL/hr   03/20/19 0308 New Bag/Given   azithromycin (ZITHROMAX) 500 mg in sodium chloride 0.9 % 250 mL IVPB 500 mg 250 mL/hr   03/20/19 6962 New Bag/Given   cefTRIAXone (ROCEPHIN) 2 g in sodium chloride 0.9 % 100 mL IVPB 2 g 200 mL/hr   03/20/19 1519 New Bag/Given   vancomycin (VANCOREADY) IVPB 750 mg/150 mL 750 mg 150 mL/hr      MEDICATIONS: . aspirin EC  81 mg Oral Daily  . [START ON 03/21/2019] azithromycin  500 mg Oral Daily  . Chlorhexidine Gluconate Cloth  6 each Topical Daily  . fluticasone  2 spray Each Nare Daily  . folic acid  1 mg Oral Daily  . gabapentin  100 mg Oral BID  . insulin aspart  0-9 Units Subcutaneous TID PC & HS  . levETIRAcetam  750 mg Oral BID  . metoprolol succinate  50 mg Oral Daily  . mupirocin ointment   Topical BID  . nystatin-triamcinolone   Topical BID  . omega-3 acid  ethyl esters  1 g Oral Daily  . phenytoin  400 mg Oral QHS  . polyethylene glycol  17 g Oral Daily  . rosuvastatin  20 mg Oral Daily  . sertraline  75 mg Oral Daily  . sodium chloride flush  10-40 mL Intracatheter Q12H    Review of Systems - 11 systems reviewed and negative per HPI   OBJECTIVE: Temp:  [98.2 F (36.8 C)-98.9 F (37.2 C)] 98.2 F (36.8 C) (03/15 1204) Pulse Rate:  [70-96] 93 (03/15 1204) Resp:  [19] 19 (03/15 1204) BP: (99-149)/(45-103) 114/57 (03/15 1204) SpO2:  [96 %-100 %] 99 % (03/15 1204) Physical Exam  Constitutional:  Sleepy, frail  HENT: [place]/AT, PERRLA, no scleral icterus Mouth/Throat: Oropharynx is clear and moist. No oropharyngeal exudate.  Cardiovascular: Normal rate, regular rhythm and normal heart sounds.  Pulmonary/Chest: Effort normal and breath sounds normal. No respiratory distress.  has no wheezes.  Neck = supple, no nuchal rigidity Abdominal: Soft. Bowel sounds are normal.  exhibits no distension. There is no tenderness.  Lymphadenopathy: no cervical adenopathy. No axillary adenopathy Neurological: alert and oriented to person, place, and time.  Skin: Bil LE with scaly eruptions  Psychiatric: a normal mood and affect.  behavior is normal.  MSK able to flex R knee and hip without pain   LABS: Results for orders placed or performed during the hospital encounter of 03/18/19 (from the past 48 hour(s))  Respiratory Panel by RT PCR (Flu A&B, Covid) - Nasopharyngeal Swab     Status: None   Collection Time: 03/19/19 12:32 AM   Specimen: Nasopharyngeal Swab  Result Value Ref Range   SARS Coronavirus 2 by RT PCR NEGATIVE NEGATIVE    Comment: (NOTE) SARS-CoV-2 target nucleic acids are NOT DETECTED. The SARS-CoV-2 RNA is generally detectable in upper respiratoy specimens during the acute phase of infection. The lowest concentration of SARS-CoV-2 viral copies this assay can detect is 131 copies/mL. A negative result does not preclude  SARS-Cov-2 infection and should not be used as the sole basis for treatment or other patient management decisions. A negative result may occur with  improper specimen collection/handling, submission of specimen other than nasopharyngeal swab, presence of viral mutation(s) within the areas targeted by this assay, and inadequate number of viral copies (<131 copies/mL). A negative result must be combined with clinical observations, patient history, and epidemiological  information. The expected result is Negative. Fact Sheet for Patients:  PinkCheek.be Fact Sheet for Healthcare Providers:  GravelBags.it This test is not yet ap proved or cleared by the [place] FDA and  has been authorized for detection and/or diagnosis of SARS-CoV-2 by FDA under an Emergency Use Authorization (EUA). This EUA will remain  in effect (meaning this test can be used) for the duration of the COVID-19 declaration under Section 564(b)(1) of the Act, 21 U.S.C. section 360bbb-3(b)(1), unless the authorization is terminated or revoked sooner.    Influenza A by PCR NEGATIVE NEGATIVE   Influenza B by PCR NEGATIVE NEGATIVE    Comment: (NOTE) The Xpert Xpress SARS-CoV-2/FLU/RSV assay is intended as an aid in  the diagnosis of influenza from Nasopharyngeal swab specimens and  should not be used as a sole basis for treatment. Nasal washings and  aspirates are unacceptable for Xpert Xpress SARS-CoV-2/FLU/RSV  testing. Fact Sheet for Patients: PinkCheek.be Fact Sheet for Healthcare Providers: GravelBags.it This test is not yet approved or cleared by the [place] FDA and  has been authorized for detection and/or diagnosis of SARS-CoV-2 by  FDA under an Emergency Use Authorization (EUA). This EUA will remain  in effect (meaning this test can be used) for the duration of the  Covid-19 declaration  under Section 564(b)(1) of the Act, 21  U.S.C. section 360bbb-3(b)(1), unless the authorization is  terminated or revoked. Performed at [facility], [address] [place], [place] [address]   Lactic acid, plasma     Status: None   Collection Time: 03/19/19 12:36 AM  Result Value Ref Range   Lactic Acid, Venous 1.7 0.5 - 1.9 mmol/L    Comment: Performed at [facility], [address] [place], [place] [address]  Comprehensive metabolic panel     Status: Abnormal   Collection Time: 03/19/19 12:36 AM  Result Value Ref Range   Sodium 135 135 - 145 mmol/L   Potassium 4.0 3.5 - 5.1 mmol/L   Chloride 106 98 - 111 mmol/L   CO2 21 (L) 22 - 32 mmol/L   Glucose, Bld 126 (H) 70 - 99 mg/dL    Comment: Glucose reference range applies only to samples taken after fasting for at least 8 hours.   BUN 19 8 - 23 mg/dL   Creatinine, Ser 1.12 (H) 0.44 - 1.00 mg/dL   Calcium 7.5 (L) 8.9 - 10.3 mg/dL   Total Protein 7.4 6.5 - 8.1 g/dL   Albumin 2.4 (L) 3.5 - 5.0 g/dL   AST 31 15 - 41 U/L   ALT 10 0 - 44 U/L   Alkaline Phosphatase 108 38 - 126 U/L   Total Bilirubin 1.5 (H) 0.3 - 1.2 mg/dL   GFR calc non Af Amer 46 (L) >60 mL/min   GFR calc Af Amer 53 (L) >60 mL/min   Anion gap 8 5 - 15    Comment: Performed at [facility], [address] [place], [place] [address]  Lipase, blood     Status: None   Collection Time: 03/19/19 12:36 AM  Result Value Ref Range   Lipase 21 11 - 51 U/L    Comment: Performed at [facility], [address] [place],  [address]  Brain natriuretic peptide - IF patient is dyspneic     Status: Abnormal   Collection Time: 03/19/19 12:36 AM  Result Value Ref Range   B Natriuretic Peptide 608.0 (H) 0.0 - 100.0 pg/mL    Comment: Performed  at [facility], [address] [place], [place] [address]  CBC WITH DIFFERENTIAL     Status: Abnormal   Collection Time: 03/19/19 12:36 AM  Result Value Ref Range   WBC 5.4 4.0 -  10.5 K/uL   RBC 2.58 (L) 3.87 - 5.11 MIL/uL   Hemoglobin 8.8 (L) 12.0 - 15.0 g/dL   HCT 28.2 (L) 36.0 - 46.0 %   MCV 109.3 (H) 80.0 - 100.0 fL   MCH 34.1 (H) 26.0 - 34.0 pg   MCHC 31.2 30.0 - 36.0 g/dL   RDW 14.6 11.5 - 15.5 %   Platelets 83 (L) 150 - 400 K/uL    Comment: REPEATED TO VERIFY Immature Platelet Fraction may be clinically indicated, consider ordering this additional test [id number]    nRBC 0.0 0.0 - 0.2 %   Neutrophils Relative % 85 %   Neutro Abs 4.6 1.7 - 7.7 K/uL   Lymphocytes Relative 3 %   Lymphs Abs 0.2 (L) 0.7 - 4.0 K/uL   Monocytes Relative 4 %   Monocytes Absolute 0.2 0.1 - 1.0 K/uL   Eosinophils Relative 7 %   Eosinophils Absolute 0.4 0.0 - 0.5 K/uL   Basophils Relative 0 %   Basophils Absolute 0.0 0.0 - 0.1 K/uL   Immature Granulocytes 1 %   Abs Immature Granulocytes 0.03 0.00 - 0.07 K/uL    Comment: Performed at [facility], [address] [place], [place] [address]  Procalcitonin     Status: None   Collection Time: 03/19/19 12:36 AM  Result Value Ref Range   Procalcitonin 0.44 ng/mL    Comment:        Interpretation: PCT (Procalcitonin) <= 0.5 ng/mL: Systemic infection (sepsis) is not likely. Local bacterial infection is possible. (NOTE)       Sepsis PCT Algorithm           Lower Respiratory Tract                                      Infection PCT Algorithm    ----------------------------     ----------------------------         PCT < 0.25 ng/mL                PCT < 0.10 ng/mL         Strongly encourage             Strongly discourage   discontinuation of antibiotics    initiation of antibiotics    ----------------------------     -----------------------------       PCT 0.25 - 0.50 ng/mL            PCT 0.10 - 0.25 ng/mL               OR       >80% decrease in PCT            Discourage initiation of                                            antibiotics      Encourage discontinuation           of antibiotics     ----------------------------     -----------------------------         PCT >= 0.50  ng/mL              PCT 0.26 - 0.50 ng/mL               AND        <80% decrease in PCT             Encourage initiation of                                             antibiotics       Encourage continuation           of antibiotics    ----------------------------     -----------------------------        PCT >= 0.50 ng/mL                  PCT > 0.50 ng/mL               AND         increase in PCT                  Strongly encourage                                      initiation of antibiotics    Strongly encourage escalation           of antibiotics                                     -----------------------------                                           PCT <= 0.25 ng/mL                                                 OR                                        > 80% decrease in PCT                                     Discontinue / Do not initiate                                             antibiotics Performed at [facility], [address] [place], [place] [address]   Protime-INR     Status: Abnormal   Collection Time: 03/19/19 12:36 AM  Result Value Ref Range   Prothrombin Time 20.2 (H) 11.4 - 15.2 seconds   INR 1.7 (H) 0.8 - 1.2    Comment: (NOTE) INR goal varies based on device and disease states. Performed at [facility], [address], [place], [place] [address]   Blood Culture (routine x 2)     Status: Abnormal (Preliminary result)   Collection Time: 03/19/19 12:36 AM   Specimen: BLOOD  Result Value Ref Range   Specimen Description      BLOOD LEFT ANTECUBITAL Performed at [facility], [address] [place], [place] [address]    Special Requests      BOTTLES DRAWN AEROBIC AND ANAEROBIC Blood Culture adequate volume Performed at [facility], [address] [place], [place] [address]    Culture  Setup Time      GRAM POSITIVE COCCI IN  BOTH AEROBIC AND ANAEROBIC BOTTLES CRITICAL RESULT CALLED TO, READ BACK BY AND VERIFIED WITH: ALEX CHAPPELL 03/19/19 @ [address]    Culture STAPHYLOCOCCUS AUREUS (A)    Report Status PENDING   POC SARS Coronavirus 2 Ag     Status: None   Collection Time: 03/19/19 12:40 AM  Result Value Ref Range   SARS Coronavirus 2 Ag NEGATIVE NEGATIVE    Comment: (NOTE) SARS-CoV-2 antigen NOT DETECTED.  Negative results are presumptive.  Negative results do not preclude SARS-CoV-2 infection and should not be used as the sole basis for treatment or other patient management decisions, including infection  control decisions, particularly in the presence of clinical signs and  symptoms consistent with COVID-19, or in those who have been in contact with the virus.  Negative results must be combined with clinical observations, patient history, and epidemiological information. The expected result is Negative. Fact Sheet for Patients: PodPark.tn Fact Sheet for Healthcare Providers: GiftContent.is This test is not yet approved or cleared by the [place] FDA and  has been authorized for detection and/or diagnosis of SARS-CoV-2 by FDA under an Emergency Use Authorization (EUA).  This EUA will remain in effect (meaning this test can be used) for the duration of  the COVID-19 de claration under Section 564(b)(1) of the Act, 21 U.S.C. section 360bbb-3(b)(1), unless the authorization is terminated or revoked sooner.   Glucose, capillary     Status: Abnormal   Collection Time: 03/19/19  8:08 AM  Result Value Ref Range   Glucose-Capillary 100 (H) 70 - 99 mg/dL    Comment: Glucose reference range applies only to samples taken after fasting for at least 8 hours.  Lactic acid, plasma     Status: None   Collection Time: 03/19/19  8:12 AM  Result Value Ref Range   Lactic Acid, Venous 1.7 0.5 - 1.9 mmol/L    Comment: Performed at [facility],  [address] [place], [place] [address]  Basic metabolic panel     Status: Abnormal   Collection Time: 03/19/19  8:12 AM  Result Value Ref Range   Sodium 134 (L) 135 - 145 mmol/L   Potassium 4.3 3.5 - 5.1 mmol/L   Chloride 109 98 - 111 mmol/L   CO2 22 22 - 32 mmol/L   Glucose, Bld 139 (H) 70 - 99 mg/dL    Comment: Glucose reference range applies only to samples taken after fasting for at least 8 hours.   BUN 22 8 - 23 mg/dL   Creatinine, Ser 1.26 (H) 0.44 - 1.00 mg/dL   Calcium 7.1 (L) 8.9 - 10.3 mg/dL   GFR calc non Af Amer 40 (L) >60 mL/min   GFR calc Af Amer 46 (L) >60 mL/min   Anion gap 3 (L) 5 - 15    Comment: Performed at [facility], [address] [place], [place] [address]  CBC     Status: Abnormal   Collection Time: 03/19/19  8:12 AM  Result Value Ref Range   WBC 8.2 4.0 - 10.5 K/uL   RBC 2.20 (L) 3.87 - 5.11 MIL/uL   Hemoglobin 7.7 (L) 12.0 - 15.0 g/dL   HCT 24.1 (L) 36.0 - 46.0 %   MCV 109.5 (H) 80.0 - 100.0 fL   MCH 35.0 (H) 26.0 - 34.0 pg   MCHC 32.0 30.0 - 36.0 g/dL   RDW 14.8 11.5 - 15.5 %   Platelets 72 (L) 150 - 400 K/uL    Comment: Immature Platelet Fraction may be clinically indicated, consider ordering this additional test [id number]    nRBC 0.0 0.0 - 0.2 %    Comment: Performed at [facility], [address] [place], [place] [address]  MRSA PCR Screening     Status: Abnormal   Collection Time: 03/19/19 12:25 PM   Specimen: Urine, Clean Catch; Nasopharyngeal  Result Value Ref Range   MRSA by PCR POSITIVE (A) NEGATIVE    Comment:        The GeneXpert MRSA Assay (FDA approved for NASAL specimens only), is one component of a comprehensive MRSA colonization surveillance program. It is not intended to diagnose MRSA infection nor to guide or monitor treatment for MRSA infections. RESULT CALLED TO, READ BACK BY AND VERIFIED WITH: DENISE [facility] AT 0973 03/19/19.PMF Performed at [facility], [address]  [place], [place] [address]   Glucose, capillary     Status: Abnormal   Collection Time: 03/19/19 12:45 PM  Result Value Ref Range   Glucose-Capillary 103 (H) 70 - 99 mg/dL    Comment: Glucose reference range applies only to samples taken after fasting for at least 8 hours.  Glucose, capillary     Status: Abnormal   Collection Time: 03/19/19  4:21 PM  Result Value Ref Range   Glucose-Capillary 100 (H) 70 - 99 mg/dL    Comment: Glucose reference range applies only to samples taken after fasting for at least 8 hours.  Glucose, capillary     Status: None   Collection Time: 03/19/19  9:32 PM  Result Value Ref Range   Glucose-Capillary 70 70 - 99 mg/dL    Comment: Glucose reference range applies only to samples taken after fasting for at least 8 hours.  CBC     Status: Abnormal   Collection Time: 03/20/19  4:50 AM  Result Value Ref Range   WBC 5.0 4.0 - 10.5 K/uL   RBC 2.36 (L) 3.87 - 5.11 MIL/uL   Hemoglobin 8.1 (L) 12.0 - 15.0 g/dL   HCT 25.3 (L) 36.0 - 46.0 %   MCV 107.2 (H) 80.0 - 100.0 fL   MCH 34.3 (H) 26.0 - 34.0 pg   MCHC 32.0 30.0 - 36.0 g/dL   RDW 15.2 11.5 - 15.5 %   Platelets 68 (L) 150 - 400 K/uL    Comment: Immature Platelet Fraction may be clinically indicated, consider ordering this additional test [id number]    nRBC 0.0 0.0 - 0.2 %    Comment: Performed at [facility], [address], [place],  [address]  Basic metabolic panel     Status: Abnormal   Collection Time: 03/20/19  4:50 AM  Result Value Ref Range   Sodium 133 (L) 135 - 145 mmol/L   Potassium 4.4 3.5 - 5.1 mmol/L    Comment: HEMOLYSIS AT THIS LEVEL MAY AFFECT RESULT  Chloride 105 98 - 111 mmol/L   CO2 20 (L) 22 - 32 mmol/L   Glucose, Bld 117 (H) 70 - 99 mg/dL    Comment: Glucose reference range applies only to samples taken after fasting for at least 8 hours.   BUN 26 (H) 8 - 23 mg/dL   Creatinine, Ser 1.24 (H) 0.44 - 1.00 mg/dL   Calcium 7.4 (L) 8.9 - 10.3 mg/dL   GFR calc non Af Amer  40 (L) >60 mL/min   GFR calc Af Amer 47 (L) >60 mL/min   Anion gap 8 5 - 15    Comment: Performed at [facility], [address] [place], [place] [address]  Glucose, capillary     Status: None   Collection Time: 03/20/19  8:10 AM  Result Value Ref Range   Glucose-Capillary 71 70 - 99 mg/dL    Comment: Glucose reference range applies only to samples taken after fasting for at least 8 hours.  Glucose, capillary     Status: None   Collection Time: 03/20/19 12:05 PM  Result Value Ref Range   Glucose-Capillary 92 70 - 99 mg/dL    Comment: Glucose reference range applies only to samples taken after fasting for at least 8 hours.  Folate     Status: Abnormal   Collection Time: 03/20/19  1:25 PM  Result Value Ref Range   Folate 4.4 (L) >5.9 ng/mL    Comment: Performed at [facility], [address] [place], [place] [address]  Iron and TIBC     Status: Abnormal   Collection Time: 03/20/19  1:25 PM  Result Value Ref Range   Iron 32 28 - 170 ug/dL   TIBC 165 (L) 250 - 450 ug/dL   Saturation Ratios 19 10.4 - 31.8 %   UIBC 133 ug/dL    Comment: Performed at [facility], [address] [place], [place] [address]  Ferritin     Status: None   Collection Time: 03/20/19  1:25 PM  Result Value Ref Range   Ferritin 168 11 - 307 ng/mL    Comment: Performed at [facility], [address] [place], [place] [address]  Reticulocytes     Status: Abnormal   Collection Time: 03/20/19  1:25 PM  Result Value Ref Range   Retic Ct Pct 2.0 0.4 - 3.1 %   RBC. 2.19 (L) 3.87 - 5.11 MIL/uL   Retic Count, Absolute 44.5 19.0 - 186.0 K/uL   Immature Retic Fract 11.7 2.3 - 15.9 %    Comment: Performed at [facility], [address] [place], [place] [address]  Immature Platelet Fraction     Status: None   Collection Time: 03/20/19  1:25 PM  Result Value Ref Range   Immature Platelet Fraction 4.9 1.2 - 8.6 %    Comment: Performed at [facility],  [address] [place], [place] [address]   No components found for: ESR, C REACTIVE PROTEIN MICRO: Recent Results (from the past 720 hour(s))  Respiratory Panel by RT PCR (Flu A&B, Covid) - Nasopharyngeal Swab     Status: None   Collection Time: 03/19/19 12:32 AM   Specimen: Nasopharyngeal Swab  Result Value Ref Range Status   SARS Coronavirus 2 by RT PCR NEGATIVE NEGATIVE Final    Comment: (NOTE) SARS-CoV-2 target nucleic acids are NOT DETECTED. The SARS-CoV-2 RNA is generally detectable in upper respiratoy specimens during the acute phase of infection. The lowest concentration of SARS-CoV-2 viral copies  this assay can detect is 131 copies/mL. A negative result does not preclude SARS-Cov-2 infection and should not be used as the sole basis for treatment or other patient management decisions. A negative result may occur with  improper specimen collection/handling, submission of specimen other than nasopharyngeal swab, presence of viral mutation(s) within the areas targeted by this assay, and inadequate number of viral copies (<131 copies/mL). A negative result must be combined with clinical observations, patient history, and epidemiological information. The expected result is Negative. Fact Sheet for Patients:  PinkCheek.be Fact Sheet for Healthcare Providers:  GravelBags.it This test is not yet ap proved or cleared by the [place] FDA and  has been authorized for detection and/or diagnosis of SARS-CoV-2 by FDA under an Emergency Use Authorization (EUA). This EUA will remain  in effect (meaning this test can be used) for the duration of the COVID-19 declaration under Section 564(b)(1) of the Act, 21 U.S.C. section 360bbb-3(b)(1), unless the authorization is terminated or revoked sooner.    Influenza A by PCR NEGATIVE NEGATIVE Final   Influenza B by PCR NEGATIVE NEGATIVE Final    Comment: (NOTE) The Xpert Xpress  SARS-CoV-2/FLU/RSV assay is intended as an aid in  the diagnosis of influenza from Nasopharyngeal swab specimens and  should not be used as a sole basis for treatment. Nasal washings and  aspirates are unacceptable for Xpert Xpress SARS-CoV-2/FLU/RSV  testing. Fact Sheet for Patients: PinkCheek.be Fact Sheet for Healthcare Providers: GravelBags.it This test is not yet approved or cleared by the [place] FDA and  has been authorized for detection and/or diagnosis of SARS-CoV-2 by  FDA under an Emergency Use Authorization (EUA). This EUA will remain  in effect (meaning this test can be used) for the duration of the  Covid-19 declaration under Section 564(b)(1) of the Act, 21  U.S.C. section 360bbb-3(b)(1), unless the authorization is  terminated or revoked. Performed at [facility], [address] [place], [place] [address]   Blood Culture (routine x 2)     Status: Abnormal (Preliminary result)   Collection Time: 03/19/19 12:36 AM   Specimen: BLOOD  Result Value Ref Range Status   Specimen Description   Final    BLOOD LEFT ANTECUBITAL Performed at [facility], [address] [place], [place] [address]    Special Requests   Final    BOTTLES DRAWN AEROBIC AND ANAEROBIC Blood Culture adequate volume Performed at [facility], [address] [place], [place] [address]    Culture  Setup Time   Final    GRAM POSITIVE COCCI IN BOTH AEROBIC AND ANAEROBIC BOTTLES CRITICAL RESULT CALLED TO, READ BACK BY AND VERIFIED WITH: ALEX CHAPPELL 03/19/19 @ 64 [place]    Culture STAPHYLOCOCCUS AUREUS (A)  Final   Report Status PENDING  Incomplete  MRSA PCR Screening     Status: Abnormal   Collection Time: 03/19/19 12:25 PM   Specimen: Urine, Clean Catch; Nasopharyngeal  Result Value Ref Range Status   MRSA by PCR POSITIVE (A) NEGATIVE Final    Comment:        The GeneXpert MRSA Assay (FDA approved for  NASAL specimens only), is one component of a comprehensive MRSA colonization surveillance program. It is not intended to diagnose MRSA infection nor to guide or monitor treatment for MRSA infections. RESULT CALLED TO, READ BACK BY AND VERIFIED WITH: DENISE [facility] AT 1638 03/19/19.PMF Performed at [facility], [address], [place], [place] [address]     IMAGING: CT HEAD  WO CONTRAST  Result Date: 03/19/2019 CLINICAL DATA:  85 year old female with fall. EXAM: CT HEAD WITHOUT CONTRAST TECHNIQUE: Contiguous axial images were obtained from the base of the skull through the vertex without intravenous contrast. COMPARISON:  Head CT dated 09/08/2018. FINDINGS: Brain: There is moderate age-related atrophy and chronic microvascular ischemic changes. There is no acute intracranial hemorrhage. No mass effect or midline shift. No extra-axial fluid collection. Vascular: No hyperdense vessel or unexpected calcification. Skull: Normal. Negative for fracture or focal lesion. Sinuses/Orbits: There is opacification of the left frontal sinuses and maxillary sinuses with remodeling of the medial wall of the right maxillary sinus. Opacification of the left sphenoid sinuses. No air-fluid level. The mastoid air cells are clear. Other: None IMPRESSION: 1. No acute intracranial pathology. 2. Age-related atrophy and chronic microvascular ischemic changes. 3. Chronic paranasal sinus disease. Electronically Signed   By: Anner Crete M.D.   On: 03/19/2019 22:31   DG Chest Port 1 View  Result Date: 03/18/2019 CLINICAL DATA:  85 year old female with sepsis. EXAM: PORTABLE CHEST 1 VIEW COMPARISON:  Chest radiograph dated 04/20/2017. FINDINGS: There is chronic interstitial coarsening and bronchitic changes. Overall slight interval progression of interstitial densities since the prior radiograph which may represent progression of background of lung disease although superimposed pneumonia is not excluded. Clinical  correlation is recommended. No focal consolidation or pneumothorax. Trace bilateral pleural effusions may be present. There is mild cardiomegaly. Atherosclerotic calcification of the aorta. No acute osseous pathology. IMPRESSION: Slight interval progression of interstitial densities may represent progression of lung disease versus superimposed pneumonia. Electronically Signed   By: Anner Crete M.D.   On: 03/18/2019 23:43   ECHOCARDIOGRAM COMPLETE  Result Date: 03/20/2019    ECHOCARDIOGRAM REPORT   Patient Name:   Brooke Baker Date of Exam: 03/20/2019 Medical Rec #:  [id number]        Height:       66.0 in Accession #:    [id number]       Weight:       162.0 lb Date of Birth:  [birth year]        BSA:          1.828 m Patient Age:    77 years         BP:           57/93 mmHg Patient Gender: F                HR:           114 bpm. Exam Location:  [facility] Procedure: 2D Echo, Cardiac Doppler and Color Doppler Indications:     Dyspnea 786.09  History:         Patient has no prior history of Echocardiogram examinations.                  CAD, COPD; Risk Factors:Hypertension. CVA.  Sonographer:     Sherrie Sport RDCS (AE) Referring Phys:  [id number] [facility] AMIN Diagnosing Phys: Bartholome Bill MD IMPRESSIONS  1. Left ventricular ejection fraction, by estimation, is 70 to 75%. Left ventricular ejection fraction by PLAX is 75 %. The left ventricle has hyperdynamic function. The left ventricle has no regional wall motion abnormalities. Left ventricular diastolic parameters are consistent with Grade I diastolic dysfunction (impaired relaxation).  2. Right ventricular systolic function is normal. The right ventricular size is mildly enlarged. There is moderately elevated pulmonary artery systolic pressure.  3. Left atrial size was mildly dilated.  4. Right atrial size was  mildly dilated.  5. The mitral valve is grossly normal. Mild mitral valve regurgitation.  6. The aortic valve is grossly normal. Aortic valve regurgitation is  trivial. FINDINGS  Left Ventricle: Left ventricular ejection fraction, by estimation, is 70 to 75%. Left ventricular ejection fraction by PLAX is 75 % The left ventricle has hyperdynamic function. The left ventricle has no regional wall motion abnormalities. The left ventricular internal cavity size was normal in size. There is borderline left ventricular hypertrophy. Left ventricular diastolic parameters are consistent with Grade I diastolic dysfunction (impaired relaxation). Right Ventricle: The right ventricular size is mildly enlarged. No increase in right ventricular wall thickness. Right ventricular systolic function is normal. There is moderately elevated pulmonary artery systolic pressure. The tricuspid regurgitant velocity is 3.27 m/s, and with an assumed right atrial pressure of 10 mmHg, the estimated right ventricular systolic pressure is 67.2 mmHg. Left Atrium: Left atrial size was mildly dilated. Right Atrium: Right atrial size was mildly dilated. Pericardium: There is no evidence of pericardial effusion. Mitral Valve: The mitral valve is grossly normal. Mild mitral valve regurgitation. Tricuspid Valve: The tricuspid valve is grossly normal. Tricuspid valve regurgitation is mild. Aortic Valve: The aortic valve is grossly normal. Aortic valve regurgitation is trivial. Aortic valve mean gradient measures 3.5 mmHg. Aortic valve peak gradient measures 7.2 mmHg. Aortic valve area, by VTI measures 2.48 cm. Pulmonic Valve: The pulmonic valve was grossly normal. Pulmonic valve regurgitation is mild. Aorta: The aortic root was not well visualized. IAS/Shunts: The atrial septum is grossly normal.  LEFT VENTRICLE PLAX 2D LV EF:         Left ventricular ejection fraction by PLAX is 75 % LVIDd:         3.69 cm LVIDs:         2.09 cm LV PW:         1.35 cm LV IVS:        0.78 cm LVOT diam:     2.00 cm LV SV:         58 LV SV Index:   32 LVOT Area:     3.14 cm  RIGHT VENTRICLE RV Basal diam:  4.34 cm LEFT ATRIUM             Index       RIGHT ATRIUM           Index LA diam:      4.70 cm  2.57 cm/m  RA Area:     24.40 cm LA Vol (A2C): 158.0 ml 86.41 ml/m RA Volume:   87.20 ml  47.69 ml/m LA Vol (A4C): 52.8 ml  28.88 ml/m  AORTIC VALVE                   PULMONIC VALVE AV Area (Vmax):    2.20 cm    PV Vmax:        0.71 m/s AV Area (Vmean):   2.43 cm    PV Peak grad:   2.0 mmHg AV Area (VTI):     2.48 cm    RVOT Peak grad: 2 mmHg AV Vmax:           134.00 cm/s AV Vmean:          87.550 cm/s AV VTI:            0.234 m AV Peak Grad:      7.2 mmHg AV Mean Grad:      3.5 mmHg LVOT Vmax:  93.80 cm/s LVOT Vmean:        67.600 cm/s LVOT VTI:          0.185 m LVOT/AV VTI ratio: 0.79  AORTA Ao Root diam: 3.10 cm MITRAL VALVE                TRICUSPID VALVE MV Area (PHT): 4.89 cm     TR Peak grad:   42.8 mmHg MV Decel Time: 155 msec     TR Vmax:        327.00 cm/s MV E velocity: 127.00 cm/s MV A velocity: 128.00 cm/s  SHUNTS MV E/A ratio:  0.99         Systemic VTI:  0.18 m                             Systemic Diam: 2.00 cm Bartholome Bill MD Electronically signed by Bartholome Bill MD Signature Date/Time: 03/20/2019/4:00:44 PM    Final     Assessment:   Brooke Baker is a 85 y.o. female with multiple medical issues admitted with fevers, sob and found to have staph aureus bacteremia. 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  Has back pain but acute on chronic  Recommendations Check TEE. Repeat BCX Cont vanco Planned 4-6 weeks IV abx Consider MRI CT spine or CT if hardware precludes MRI if the back pain worsens Check ESR CRP WIll need PICC once bcx clear Thank you very much for allowing me to participate in the care of this patient. Please call with questions.   Cheral Marker. Ola Spurr, MD

## 2019-03-20 NOTE — Progress Notes (Addendum)
PROGRESS NOTE    Brooke Baker  [id number] DOB: 1934/07/07 DOA: 03/18/2019 PCP: Einar Pheasant, MD   Brief Narrative:  [name], type diabetes mellitus, hypertension, dyslipidemia and coronary artery disease, who presented to the emergency room with acute onset of fever with associated generalized weakness and dyspnea without significant cough and wheezing. She has been having orthopnea and paroxysmal nocturnal dyspnea as well as worsening lower extremity edema. She admits to dyspnea on exertion. She admitted to nausea and vomiting without abdominal pain.  On presentation febrile, chest x-ray with bilateral interstitial densities concerning for disease progression versus superimposed pneumonia. She was started on ceftriaxone and azithromycin. Blood cultures are growing staph aureus.  Antibiotics switched with vancomycin.  Subjective: Patient had unwitnessed fall overnight while going to the bathroom.  She was complaining of some soreness due to fall.  CT head was negative.  Assessment & Plan:   Active Problems:   Sepsis ([place])  Sepsis secondary to staph aureus bacteremia and bilateral pneumonia.  Pending further identification.  MRSA swab positive.  Was started on vancomycin with preliminary result reports of positive gram-positive cocci in blood culture. -Discontinue ceftriaxone. -Continue with vancomycin now-we will de-escalate according to culture results. -Get echocardiogram-might need TEE if TTE is negative. -Continue supportive care with DuoNeb.  Acute on chronic diastolic CHF, possibly cor pulmonale.  She has a history of preserved EF of 55% with NYHA class II- III ACC/AHA stage C. Patient is on 2 L at home. She was initially diuresed with Lasix 40 mg twice daily. -Discontinue Lasix today due to increase in creatinine. -Continue to  monitor. -Continue daily BMP, strict intake and output and daily weight. -Echocardiogram-pending.  Paroxysmal atrial fibrillation.  Currently regular and rate controlled. -Continue Toprol-XL and Pradaxa.  Hypertension.  Blood pressure little soft. -Holding home antibiotics today.-We will restart it if needed.  Macrocytic anemia.  Hemoglobin stable.  Patient follow-up with hematology.  She has rheumatoid factor positive psoriatic arthritis.  Labs were consistent with hypoproliferative bone marrow.  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  They were planning to do a bone marrow evaluation if hemoglobin continued to drop, the time of prior hematology visit her hemoglobin was 9.7.  Today it was 8.1. -Check anemia panel. -Continue to monitor.  Thrombocytopenia.  Worsening thrombocytopenia with platelet of 68.  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  Patient is also on Pradaxa which increased her chances of bleeding. Discussed with Dr. Tasia Catchings, and he will see her this afternoon.  They normally recommend discontinuation of Pradaxa if platelet drops below 50,000. -We will continue Pradaxa for now and also get opinion from her cardiologist. -Continue to monitor platelets. -For any sign of bleeding.  History of seizure disorder.  No acute concern. -We will continue home antiseizure meds.  Dyslipidemia. -Continue statin.  Depression. -Continue Zoloft.  Objective: Vitals:   03/20/19 0800 03/20/19 0919 03/20/19 1000 03/20/19 1204  BP: (!) 107/45 (!) 102/48  (!) 114/57  Pulse: 90   93  Resp:    19  Temp:    98.2 F (36.8 C)  TempSrc:      SpO2:   98% 99%  Weight:      Height:        Intake/Output Summary (Last 24 hours) at 03/20/2019 1214 Last data filed at 03/20/2019 1003 Gross per 24 hour  Intake 1060 ml  Output 0 ml  Net 1060 ml   Filed Weights   03/18/19 2317  Weight: 73.5 kg    Examination:  General  exam: Appears calm and comfortable  Respiratory system: Clear to auscultation. Respiratory effort normal. Cardiovascular system: S1 & S2 heard, RRR. No JVD, murmurs, rubs, gallops or clicks. Gastrointestinal system: Soft, nontender, nondistended, bowel sounds positive. Central nervous system: Alert and oriented. No focal neurological deficits.Symmetric 5 x 5 power. Extremities: 1+LE edema with signs of chronic venous dermatitis, no cyanosis, pulses intact and symmetrical. Psychiatry: Judgement and insight appear normal. Mood & affect appropriate.    DVT prophylaxis: Pradaxa Code Status: Full Family Communication:  daughter Brooke Baker was updated on phone. Disposition Plan: Pending improvement in work-up.  Patient is growing staph aureus in her blood cultures.  Pending further data and work-up.  PT recommending SNF placement.  Consultants:   Hematology  Procedures:  Antimicrobials:   Data Reviewed: I have personally reviewed following labs and imaging studies  CBC: Recent Labs  Lab 03/19/19 0036 03/19/19 0812 03/20/19 0450  WBC 5.4 8.2 5.0  NEUTROABS 4.6  --   --   HGB 8.8* 7.7* 8.1*  HCT 28.2* 24.1* 25.3*  MCV 109.3* 109.5* 107.2*  PLT 83* 72* 68*   Basic Metabolic Panel: Recent Labs  Lab 03/19/19 0036 03/19/19 0812 03/20/19 0450  NA 135 134* 133*  K 4.0 4.3 4.4  CL 106 109 105  CO2 21* 22 20*  GLUCOSE 126* 139* 117*  BUN 19 22 26*  CREATININE 1.12* 1.26* 1.24*  CALCIUM 7.5* 7.1* 7.4*   GFR: Estimated Creatinine Clearance: 35.9 mL/min (A) (by C-G formula based on SCr of 1.24 mg/dL (H)). Liver Function Tests: Recent Labs  Lab 03/19/19 0036  AST 31  ALT 10  ALKPHOS 108  BILITOT 1.5*  PROT 7.4  ALBUMIN 2.4*   Recent Labs  Lab 03/19/19 0036  LIPASE 21   No results for input(s): AMMONIA in the last 168 hours. Coagulation Profile: Recent Labs  Lab 03/19/19 0036  INR 1.7*   Cardiac Enzymes: No results for input(s): CKTOTAL, CKMB, CKMBINDEX, TROPONINI  in the last 168 hours. BNP (last 3 results) No results for input(s): PROBNP in the last 8760 hours. HbA1C: No results for input(s): HGBA1C in the last 72 hours. CBG: Recent Labs  Lab 03/19/19 1245 03/19/19 1621 03/19/19 2132 03/20/19 0810 03/20/19 1205  GLUCAP 103* 100* 70 71 92   Lipid Profile: No results for input(s): CHOL, HDL, LDLCALC, TRIG, CHOLHDL, LDLDIRECT in the last 72 hours. Thyroid Function Tests: No results for input(s): TSH, T4TOTAL, FREET4, T3FREE, THYROIDAB in the last 72 hours. Anemia Panel: No results for input(s): VITAMINB12, FOLATE, FERRITIN, TIBC, IRON, RETICCTPCT in the last 72 hours. Sepsis Labs: Recent Labs  Lab 03/19/19 0036 03/19/19 0812  PROCALCITON 0.44  --   LATICACIDVEN 1.7 1.7    Recent Results (from the past 240 hour(s))  Respiratory Panel by RT PCR (Flu A&B, Covid) - Nasopharyngeal Swab     Status: None   Collection Time: 03/19/19 12:32 AM   Specimen: Nasopharyngeal Swab  Result Value Ref Range Status   SARS Coronavirus 2 by RT PCR NEGATIVE NEGATIVE Final    Comment: (NOTE) SARS-CoV-2 target nucleic acids are NOT DETECTED. The SARS-CoV-2 RNA is generally detectable in upper respiratoy specimens during the acute phase of infection. The lowest concentration of SARS-CoV-2 viral copies this assay can detect is 131 copies/mL. A negative result does not preclude SARS-Cov-2 infection and should not be used as  the sole basis for treatment or other patient management decisions. A negative result may occur with  improper specimen collection/handling, submission of specimen other than nasopharyngeal swab, presence of viral mutation(s) within the areas targeted by this assay, and inadequate number of viral copies (<131 copies/mL). A negative result must be combined with clinical observations, patient history, and epidemiological information. The expected result is Negative. Fact Sheet for Patients:   PinkCheek.be Fact Sheet for Healthcare Providers:  GravelBags.it This test is not yet ap proved or cleared by the [place] FDA and  has been authorized for detection and/or diagnosis of SARS-CoV-2 by FDA under an Emergency Use Authorization (EUA). This EUA will remain  in effect (meaning this test can be used) for the duration of the COVID-19 declaration under Section 564(b)(1) of the Act, 21 U.S.C. section 360bbb-3(b)(1), unless the authorization is terminated or revoked sooner.    Influenza A by PCR NEGATIVE NEGATIVE Final   Influenza B by PCR NEGATIVE NEGATIVE Final    Comment: (NOTE) The Xpert Xpress SARS-CoV-2/FLU/RSV assay is intended as an aid in  the diagnosis of influenza from Nasopharyngeal swab specimens and  should not be used as a sole basis for treatment. Nasal washings and  aspirates are unacceptable for Xpert Xpress SARS-CoV-2/FLU/RSV  testing. Fact Sheet for Patients: PinkCheek.be Fact Sheet for Healthcare Providers: GravelBags.it This test is not yet approved or cleared by the [place] FDA and  has been authorized for detection and/or diagnosis of SARS-CoV-2 by  FDA under an Emergency Use Authorization (EUA). This EUA will remain  in effect (meaning this test can be used) for the duration of the  Covid-19 declaration under Section 564(b)(1) of the Act, 21  U.S.C. section 360bbb-3(b)(1), unless the authorization is  terminated or revoked. Performed at [facility], [address] [place], [place] [address]   Blood Culture (routine x 2)     Status: Abnormal (Preliminary result)   Collection Time: 03/19/19 12:36 AM   Specimen: BLOOD  Result Value Ref Range Status   Specimen Description   Final    BLOOD LEFT ANTECUBITAL Performed at [facility], [address] [place], [place] [address]    Special Requests   Final     BOTTLES DRAWN AEROBIC AND ANAEROBIC Blood Culture adequate volume Performed at [facility], [address] [place], [place] [address]    Culture  Setup Time   Final    GRAM POSITIVE COCCI IN BOTH AEROBIC AND ANAEROBIC BOTTLES CRITICAL RESULT CALLED TO, READ BACK BY AND VERIFIED WITH: ALEX CHAPPELL 03/19/19 @ 65 [place]    Culture STAPHYLOCOCCUS AUREUS (A)  Final   Report Status PENDING  Incomplete  MRSA PCR Screening     Status: Abnormal   Collection Time: 03/19/19 12:25 PM   Specimen: Urine, Clean Catch; Nasopharyngeal  Result Value Ref Range Status   MRSA by PCR POSITIVE (A) NEGATIVE Final    Comment:        The GeneXpert MRSA Assay (FDA approved for NASAL specimens only), is one component of a comprehensive MRSA colonization surveillance program. It is not intended to diagnose MRSA infection nor to guide or monitor treatment for MRSA infections. RESULT CALLED TO, READ BACK BY AND VERIFIED WITH: DENISE [facility] AT [id number] 03/19/19.PMF Performed at [facility], [address] [place], [place] [address]      Radiology Studies: CT HEAD WO CONTRAST  Result Date: 03/19/2019 CLINICAL DATA:  85 year old female with fall. EXAM: CT HEAD WITHOUT CONTRAST TECHNIQUE:  Contiguous axial images were obtained from the base of the skull through the vertex without intravenous contrast. COMPARISON:  Head CT dated 09/08/2018. FINDINGS: Brain: There is moderate age-related atrophy and chronic microvascular ischemic changes. There is no acute intracranial hemorrhage. No mass effect or midline shift. No extra-axial fluid collection. Vascular: No hyperdense vessel or unexpected calcification. Skull: Normal. Negative for fracture or focal lesion. Sinuses/Orbits: There is opacification of the left frontal sinuses and maxillary sinuses with remodeling of the medial wall of the right maxillary sinus. Opacification of the left sphenoid sinuses. No air-fluid level. The mastoid air cells are  clear. Other: None IMPRESSION: 1. No acute intracranial pathology. 2. Age-related atrophy and chronic microvascular ischemic changes. 3. Chronic paranasal sinus disease. Electronically Signed   By: Anner Crete M.D.   On: 03/19/2019 22:31   DG Chest Port 1 View  Result Date: 03/18/2019 CLINICAL DATA:  85 year old female with sepsis. EXAM: PORTABLE CHEST 1 VIEW COMPARISON:  Chest radiograph dated 04/20/2017. FINDINGS: There is chronic interstitial coarsening and bronchitic changes. Overall slight interval progression of interstitial densities since the prior radiograph which may represent progression of background of lung disease although superimposed pneumonia is not excluded. Clinical correlation is recommended. No focal consolidation or pneumothorax. Trace bilateral pleural effusions may be present. There is mild cardiomegaly. Atherosclerotic calcification of the aorta. No acute osseous pathology. IMPRESSION: Slight interval progression of interstitial densities may represent progression of lung disease versus superimposed pneumonia. Electronically Signed   By: Anner Crete M.D.   On: 03/18/2019 23:43    Scheduled Meds: . aspirin EC  81 mg Oral Daily  . [START ON 03/21/2019] azithromycin  500 mg Oral Daily  . Chlorhexidine Gluconate Cloth  6 each Topical Daily  . dabigatran  150 mg Oral Q12H  . fluticasone  2 spray Each Nare Daily  . gabapentin  100 mg Oral BID  . insulin aspart  0-9 Units Subcutaneous TID PC & HS  . levETIRAcetam  750 mg Oral BID  . metoprolol succinate  50 mg Oral Daily  . mupirocin ointment   Topical BID  . nystatin-triamcinolone   Topical BID  . omega-3 acid ethyl esters  1 g Oral Daily  . phenytoin  400 mg Oral QHS  . polyethylene glycol  17 g Oral Daily  . rosuvastatin  20 mg Oral Daily  . sertraline  75 mg Oral Daily  . sodium chloride flush  10-40 mL Intracatheter Q12H   Continuous Infusions: . vancomycin       LOS: 1 day   Time spent: 45  minutes.  Lorella Nimrod, MD [facility]  If 7PM-7AM, please contact night-coverage [email or web address]  03/20/2019, 12:14 PM   This record has been created using Systems analyst. Errors have been sought and corrected,but may not always be located. Such creation errors do not reflect on the standard of care.

## 2019-03-20 NOTE — Progress Notes (Signed)
*  PRELIMINARY RESULTS* Echocardiogram 2D Echocardiogram has been performed.  Brooke Baker 03/20/2019, 2:49 PM

## 2019-03-20 NOTE — Consult Note (Signed)
Hematology/Oncology Consult note [facility] [phone] [phone]  Patient Care Team: Einar Pheasant, MD as PCP - General   Name of the patient: Brooke Baker  [id number]  11-12-1934    Reason for consult: Thrombocytopenia   Requesting physician: Dr. Reesa Chew  Date of visit: 03/20/2019    History of presenting illness-patient is a 85 year old female with multiple comorbidities including CAD, paroxysmal A. fib on chronic anticoagulation with Pradaxa, hypertension hyperlipidemia history of congestive heart failure who presented to the ER with symptoms of shortness of breath.  She was noted to have tachycardia, fever and concern for healthcare associated pneumonia.  Blood culture showed staph aureus in 1 out of 2 blood cultures.  Hematology consulted for thrombocytopenia  I have seen her as an outpatient for macrocytic anemia as well as thrombocytopenia Prior to her admission to the hospital her hemoglobin was between 9-10 and platelet counts in the 100s.  White cell count was normal.  On admission to the hospital on 03/19/2018 when she was noted to have a platelet count of 83 which is drifted down to 68 today.  Hemoglobin is down to 8.1.  Because of her anemia/thrombocytopenia as an outpatient has been attributed to possible MDS although she has not had a bone marrow biopsy for definitive diagnosis.  Patient is currently fatigued and unable to give much history. She had a all yesterday in the hospital  ECOG PS- 2    Review of systems- Review of Systems  Constitutional: Positive for malaise/fatigue. Negative for chills, fever and weight loss.  HENT: Negative for congestion, ear discharge and nosebleeds.   Eyes: Negative for blurred vision.  Respiratory: Negative for cough, hemoptysis, sputum production, shortness of breath and wheezing.   Cardiovascular: Negative for chest pain, palpitations, orthopnea and claudication.  Gastrointestinal:  Negative for abdominal pain, blood in stool, constipation, diarrhea, heartburn, melena, nausea and vomiting.  Genitourinary: Negative for dysuria, flank pain, frequency, hematuria and urgency.  Musculoskeletal: Negative for back pain, joint pain and myalgias.  Skin: Negative for rash.  Neurological: Negative for dizziness, tingling, focal weakness, seizures, weakness and headaches.  Endo/Heme/Allergies: Does not bruise/bleed easily.  Psychiatric/Behavioral: Negative for depression and suicidal ideas. The patient does not have insomnia.     Allergies  Allergen Reactions  . Doxycycline Diarrhea and Nausea Only  . Methotrexate Rash    Patient Active Problem List   Diagnosis Date Noted  . Community acquired pneumonia   . Sepsis ([name]) 03/19/2019  . Chronic respiratory failure ([place]) 02/05/2019  . Macrocytic anemia 01/14/2019  . Skin lesion 11/13/2018  . Leg lesion 11/13/2018  . Cough 09/25/2018  . Fall 09/25/2018  . Thrombocytopenia ([place]) 09/11/2018  . Hyperglycemia 09/11/2018  . ILD (interstitial lung disease) ([place]) 01/14/2018  . CHF (congestive heart failure) ([place]) 07/01/2017  . Therapeutic drug monitoring 03/25/2017  . BMI 39.0-39.9,adult 03/07/2017  . Falls frequently 03/18/2016  . Psoriatic arthritis ([place]) 05/14/2015  . Health care maintenance 10/31/2014  . Stress 12/03/2013  . Obesity 08/01/2013  . Neuropathy 08/01/2013  . Macrocytosis 08/01/2013  . Hypertension 12/18/2011  . Chronic back pain 12/18/2011  . Seizure disorder ([place]) 12/18/2011  . AF (paroxysmal atrial fibrillation) ([place]) 12/18/2011  . Hypercholesterolemia 12/18/2011  . Asthma, moderate persistent 04/08/2009  . Coronary atherosclerosis 04/19/2007  . History of CVA (cerebrovascular accident) 04/19/2007  . ASTHMATIC BRONCHITIS, ACUTE 04/19/2007  . Seasonal and perennial allergic rhinitis 04/19/2007     Past Medical History:  Diagnosis Date  . Acute bronchitis   .  Allergic rhinitis   . Arthritis   .  CAD (coronary artery disease)    s/p stent LAD 8/03 and stent placement OM1  . Cancer ([place])    skin  . Chronic back pain   . COPD with asthma ([place])   . CVA (cerebral vascular accident) ([place])   . Diabetes mellitus ([place])   . Hypercholesterolemia   . Hypertension   . Seizure disorder [place])      Past Surgical History:  Procedure Laterality Date  . BREAST BIOPSY Bilateral    axillas  . CORONARY STENT PLACEMENT  8/03   LAD  . CORONARY STENT PLACEMENT     OM1  2006  . FEMUR FRACTURE SURGERY Right [id number]   Dr. Marry Guan  . LUMBAR SPINE SURGERY     x3  . TOTAL ABDOMINAL HYSTERECTOMY     appendectomy - age 61  . TOTAL HIP ARTHROPLASTY    . TOTAL KNEE ARTHROPLASTY     2005    Social History   Socioeconomic History  . Marital status: Married    Spouse name: Not on file  . Number of children: 3  . Years of education: hs  . Highest education level: Not on file  Occupational History  . Occupation: retired Psychologist, sport and exercise: RETIRED  Tobacco Use  . Smoking status: Never Smoker  . Smokeless tobacco: Never Used  Substance and Sexual Activity  . Alcohol use: No    Alcohol/week: 0.0 standard drinks  . Drug use: No  . Sexual activity: Never  Other Topics Concern  . Not on file  Social History Narrative   lives with husband- Edsel   Social Determinants of Health   Financial Resource Strain:   . Difficulty of Paying Living Expenses:   Food Insecurity:   . Worried About Charity fundraiser in the Last Year:   . Arboriculturist in the Last Year:   Transportation Needs:   . Film/video editor (Medical):   [name] Lack of Transportation (Non-Medical):   Physical Activity:   . Days of Exercise per Week:   . Minutes of Exercise per Session:   Stress:   . Feeling of Stress :   Social Connections:   . Frequency of Communication with Friends and Family:   . Frequency of Social Gatherings with Friends and Family:   . Attends Religious Services:   . Active Member of Clubs  or Organizations:   . Attends Archivist Meetings:   [name] Marital Status:   Intimate Partner Violence:   . Fear of Current or Ex-Partner:   . Emotionally Abused:   [name] Physically Abused:   . Sexually Abused:      Family History  Problem Relation Age of Onset  . Allergies Brother   . Cancer Brother   . Allergies Sister   . Cancer Sister        Non-hodgkins lymphoma  . Asthma Sister   . Rheum arthritis Sister   . Stroke Mother   . Asthma Brother   . Rheum arthritis Brother   . Cancer Sister        non-hodgkins lymphoma  . Diabetes Brother      Current Facility-Administered Medications:  .  acetaminophen (TYLENOL) tablet 650 mg, 650 mg, Oral, Q6H PRN **OR** acetaminophen (TYLENOL) suppository 650 mg, 650 mg, Rectal, Q6H PRN, Mansy, Jan A, MD .  aspirin EC tablet 81 mg, 81 mg, Oral, Daily, Fath, Javier Docker, MD .  Derrill Memo  ON 03/21/2019] azithromycin (ZITHROMAX) tablet 500 mg, 500 mg, Oral, Daily, Oswald Hillock, RPH .  Chlorhexidine Gluconate Cloth 2 % PADS 6 each, 6 each, Topical, Daily, Lorella Nimrod, MD, 6 each at 03/20/19 1000 .  fluticasone (FLONASE) 50 MCG/ACT nasal spray 2 spray, 2 spray, Each Nare, Daily, Mansy, Jan A, MD .  gabapentin (NEURONTIN) capsule 100 mg, 100 mg, Oral, BID, Mansy, Jan A, MD, 100 mg at 03/20/19 0914 .  insulin aspart (novoLOG) injection 0-9 Units, 0-9 Units, Subcutaneous, TID PC & HS, Mansy, Jan A, MD .  levETIRAcetam (KEPPRA) tablet 750 mg, 750 mg, Oral, BID, Mansy, Jan A, MD, 750 mg at 03/20/19 0918 .  magnesium hydroxide (MILK OF MAGNESIA) suspension 30 mL, 30 mL, Oral, Daily PRN, Mansy, Jan A, MD .  metoprolol succinate (TOPROL-XL) 24 hr tablet 50 mg, 50 mg, Oral, Daily, Mansy, Jan A, MD, 50 mg at 03/19/19 0853 .  mupirocin ointment (BACTROBAN) 2 %, , Topical, BID, Mansy, Arvella Merles, MD, Given at 03/20/19 0915 .  nystatin-triamcinolone (MYCOLOG II) cream, , Topical, BID, Mansy, Arvella Merles, MD, Given at 03/20/19 0915 .  omega-3 acid ethyl esters  (LOVAZA) capsule 1 g, 1 g, Oral, Daily, Mansy, Jan A, MD, 1 g at 03/20/19 0913 .  ondansetron (ZOFRAN) tablet 4 mg, 4 mg, Oral, Q6H PRN **OR** ondansetron (ZOFRAN) injection 4 mg, 4 mg, Intravenous, Q6H PRN, Mansy, Jan A, MD .  phenytoin (DILANTIN) ER capsule 400 mg, 400 mg, Oral, QHS, Mansy, Jan A, MD, 400 mg at 03/19/19 2134 .  polyethylene glycol (MIRALAX / GLYCOLAX) packet 17 g, 17 g, Oral, Daily, Mansy, Jan A, MD, 17 g at 03/20/19 0918 .  rosuvastatin (CRESTOR) tablet 20 mg, 20 mg, Oral, Daily, Mansy, Jan A, MD, 20 mg at 03/19/19 1749 .  sertraline (ZOLOFT) tablet 75 mg, 75 mg, Oral, Daily, Mansy, Jan A, MD, 75 mg at 03/20/19 0913 .  sodium chloride flush (NS) 0.9 % injection 10-40 mL, 10-40 mL, Intracatheter, Q12H, Lorella Nimrod, MD, 10 mL at 03/20/19 0918 .  traZODone (DESYREL) tablet 25 mg, 25 mg, Oral, QHS PRN, Mansy, Jan A, MD, 25 mg at 03/19/19 2304 .  vancomycin (VANCOREADY) IVPB 750 mg/150 mL, 750 mg, Intravenous, Q24H, Benita Gutter [facility]   Physical exam:  Vitals:   03/20/19 0800 03/20/19 0919 03/20/19 1000 03/20/19 1204  BP: (!) 107/45 (!) 102/48  (!) 114/57  Pulse: 90   93  Resp:    19  Temp:    98.2 F (36.8 C)  TempSrc:      SpO2:   98% 99%  Weight:      Height:       Physical Exam HENT:     Head: Normocephalic and atraumatic.  Eyes:     Pupils: Pupils are equal, round, and reactive to light.  Cardiovascular:     Rate and Rhythm: Normal rate. Rhythm irregular.     Heart sounds: Normal heart sounds.  Pulmonary:     Effort: Pulmonary effort is normal.     Breath sounds: Normal breath sounds.  Abdominal:     General: Bowel sounds are normal.     Palpations: Abdomen is soft.  Musculoskeletal:     Cervical back: Normal range of motion.  Skin:    General: Skin is warm and dry.  Neurological:     Mental Status: She is alert and oriented to person, place, and time.        CMP Latest Ref Rng & Units  03/20/2019  Glucose 70 - 99 mg/dL 117(H)  BUN 8 - 23  mg/dL 26(H)  Creatinine 0.44 - 1.00 mg/dL 1.24(H)  Sodium 135 - 145 mmol/L 133(L)  Potassium 3.5 - 5.1 mmol/L 4.4  Chloride 98 - 111 mmol/L 105  CO2 22 - 32 mmol/L 20(L)  Calcium 8.9 - 10.3 mg/dL 7.4(L)  Total Protein 6.5 - 8.1 g/dL -  Total Bilirubin 0.3 - 1.2 mg/dL -  Alkaline Phos 38 - 126 U/L -  AST 15 - 41 U/L -  ALT 0 - 44 U/L -   CBC Latest Ref Rng & Units 03/20/2019  WBC 4.0 - 10.5 K/uL 5.0  Hemoglobin 12.0 - 15.0 g/dL 8.1(L)  Hematocrit 36.0 - 46.0 % 25.3(L)  Platelets 150 - 400 K/uL 68(L)    @IMAGES @  CT HEAD WO CONTRAST  Result Date: 03/19/2019 CLINICAL DATA:  85 year old female with fall. EXAM: CT HEAD WITHOUT CONTRAST TECHNIQUE: Contiguous axial images were obtained from the base of the skull through the vertex without intravenous contrast. COMPARISON:  Head CT dated 09/08/2018. FINDINGS: Brain: There is moderate age-related atrophy and chronic microvascular ischemic changes. There is no acute intracranial hemorrhage. No mass effect or midline shift. No extra-axial fluid collection. Vascular: No hyperdense vessel or unexpected calcification. Skull: Normal. Negative for fracture or focal lesion. Sinuses/Orbits: There is opacification of the left frontal sinuses and maxillary sinuses with remodeling of the medial wall of the right maxillary sinus. Opacification of the left sphenoid sinuses. No air-fluid level. The mastoid air cells are clear. Other: None IMPRESSION: 1. No acute intracranial pathology. 2. Age-related atrophy and chronic microvascular ischemic changes. 3. Chronic paranasal sinus disease. Electronically Signed   By: Anner Crete M.D.   On: 03/19/2019 22:31   DG Chest Port 1 View  Result Date: 03/18/2019 CLINICAL DATA:  85 year old female with sepsis. EXAM: PORTABLE CHEST 1 VIEW COMPARISON:  Chest radiograph dated 04/20/2017. FINDINGS: There is chronic interstitial coarsening and bronchitic changes. Overall slight interval progression of interstitial densities  since the prior radiograph which may represent progression of background of lung disease although superimposed pneumonia is not excluded. Clinical correlation is recommended. No focal consolidation or pneumothorax. Trace bilateral pleural effusions may be present. There is mild cardiomegaly. Atherosclerotic calcification of the aorta. No acute osseous pathology. IMPRESSION: Slight interval progression of interstitial densities may represent progression of lung disease versus superimposed pneumonia. Electronically Signed   By: Anner Crete M.D.   On: 03/18/2019 23:43    Assessment and plan- Patient is a 85 y.o. female admitted for acute hypoxic respiratory failure secondary to pneumonia.  Hematology consulted for thrombocytopenia  1.  Thrombocytopenia: At baseline patient's platelet counts are typically in the 100s which are further low at this time probably secondary to ongoing infection.  Also her folic acid levels are low and I would recommend starting on oral folate 1 mg daily.  She may have an autoimmune component to her thrombocytopenia as well given her history of psoriatic arthritis and positive ANA in the past.  She does not require any treatment for her thrombocytopenia at this time except such as oral steroids.  I will hold off on a bone marrow biopsy at this time.  Okay to continue anticoagulation as long as platelets are greater than 50.  Suspect counts should improve after acute episode of sepsis is better. Low IPF indicative of hypo proliferative process.  Will check LDH, DIC labs and smear review in a.m.  2.  Anemia: Again suspect secondary to acute  sepsis making it worse.  At baseline her hemoglobin is between 9-10.  Iron studies indicate a low TIBC indicative of anemia of chronic disease.  B12 levels are pending but she has had B12 levels checked this year which were normal. Start folic acid 1 mg daily. Continue to monitor   Visit Diagnosis 1. Sepsis, due to unspecified organism,  unspecified whether acute organ dysfunction present ([place])   2. Community acquired pneumonia, unspecified laterality   3. Chronic respiratory failure, unspecified whether with hypoxia or hypercapnia ([place])     Dr. Randa Evens, MD, MPH [facility] [phone] 03/20/2019  4:15 PM

## 2019-03-21 ENCOUNTER — Encounter
Admission: EM | Disposition: A | Discharge status: Skilled Nursing Facility | Payer: Self-pay | Source: Home / Self Care | Attending: Internal Medicine

## 2019-03-21 ENCOUNTER — Encounter: Payer: Self-pay | Admitting: Anesthesiology

## 2019-03-21 ENCOUNTER — Inpatient Hospital Stay
Admit: 2019-03-21 | Discharge: 2019-03-21 | Disposition: A | Discharge status: Home / Self Care | Payer: Medicare PPO | Attending: Cardiology | Admitting: Cardiology

## 2019-03-21 ENCOUNTER — Encounter: Payer: Self-pay | Admitting: Family Medicine

## 2019-03-21 HISTORY — PX: TEE WITHOUT CARDIOVERSION: SHX5443

## 2019-03-21 LAB — CBC WITH DIFFERENTIAL/PLATELET
Abs Immature Granulocytes: 0.02 10*3/uL (ref 0.00–0.07)
Basophils Absolute: 0 10*3/uL (ref 0.0–0.1)
Basophils Relative: 0 %
Eosinophils Absolute: 0.2 10*3/uL (ref 0.0–0.5)
Eosinophils Relative: 5 %
HCT: 22.5 % — ABNORMAL LOW (ref 36.0–46.0)
Hemoglobin: 7.2 g/dL — ABNORMAL LOW (ref 12.0–15.0)
Immature Granulocytes: 0 %
Lymphocytes Relative: 12 %
Lymphs Abs: 0.6 10*3/uL — ABNORMAL LOW (ref 0.7–4.0)
MCH: 35 pg — ABNORMAL HIGH (ref 26.0–34.0)
MCHC: 32 g/dL (ref 30.0–36.0)
MCV: 109.2 fL — ABNORMAL HIGH (ref 80.0–100.0)
Monocytes Absolute: 0.7 10*3/uL (ref 0.1–1.0)
Monocytes Relative: 15 %
Neutro Abs: 3.2 10*3/uL (ref 1.7–7.7)
Neutrophils Relative %: 68 %
Platelets: 71 10*3/uL — ABNORMAL LOW (ref 150–400)
RBC: 2.06 MIL/uL — ABNORMAL LOW (ref 3.87–5.11)
RDW: 15.5 % (ref 11.5–15.5)
Smear Review: NORMAL
WBC: 4.7 10*3/uL (ref 4.0–10.5)
nRBC: 0 % (ref 0.0–0.2)

## 2019-03-21 LAB — ABO/RH: ABO/RH(D): O POS

## 2019-03-21 LAB — APTT: aPTT: 105 seconds — ABNORMAL HIGH (ref 24–36)

## 2019-03-21 LAB — HEMOGLOBIN: Hemoglobin: 7.6 g/dL — ABNORMAL LOW (ref 12.0–15.0)

## 2019-03-21 LAB — BASIC METABOLIC PANEL
Anion gap: 6 (ref 5–15)
BUN: 32 mg/dL — ABNORMAL HIGH (ref 8–23)
CO2: 23 mmol/L (ref 22–32)
Calcium: 7.5 mg/dL — ABNORMAL LOW (ref 8.9–10.3)
Chloride: 107 mmol/L (ref 98–111)
Creatinine, Ser: 1.36 mg/dL — ABNORMAL HIGH (ref 0.44–1.00)
GFR calc Af Amer: 42 mL/min — ABNORMAL LOW (ref >60)
GFR calc non Af Amer: 36 mL/min — ABNORMAL LOW (ref >60)
Glucose, Bld: 95 mg/dL (ref 70–99)
Potassium: 3.7 mmol/L (ref 3.5–5.1)
Sodium: 136 mmol/L (ref 135–145)

## 2019-03-21 LAB — CULTURE, BLOOD (ROUTINE X 2): Special Requests: ADEQUATE

## 2019-03-21 LAB — GLUCOSE, CAPILLARY
Glucose-Capillary: 83 mg/dL (ref 70–99)
Glucose-Capillary: 77 mg/dL (ref 70–99)
Glucose-Capillary: 81 mg/dL (ref 70–99)
Glucose-Capillary: 92 mg/dL (ref 70–99)
Glucose-Capillary: 123 mg/dL — ABNORMAL HIGH (ref 70–99)

## 2019-03-21 LAB — PROTIME-INR
INR: 2.1 — ABNORMAL HIGH (ref 0.8–1.2)
Prothrombin Time: 23.8 seconds — ABNORMAL HIGH (ref 11.4–15.2)

## 2019-03-21 LAB — TECHNOLOGIST SMEAR REVIEW: Plt Morphology: NORMAL

## 2019-03-21 LAB — LACTATE DEHYDROGENASE: LDH: 153 U/L (ref 98–192)

## 2019-03-21 LAB — PREPARE RBC (CROSSMATCH)

## 2019-03-21 LAB — SEDIMENTATION RATE: Sed Rate: 75 mm/hr — ABNORMAL HIGH (ref 0–30)

## 2019-03-21 LAB — FIBRINOGEN: Fibrinogen: 306 mg/dL (ref 210–475)

## 2019-03-21 SURGERY — ECHOCARDIOGRAM, TRANSESOPHAGEAL
Anesthesia: Moderate Sedation

## 2019-03-21 MED ORDER — LIDOCAINE VISCOUS HCL 2 % MT SOLN
OROMUCOSAL | Status: AC
  Administered 2019-03-21: 15 mL
  Filled 2019-03-21: qty 15

## 2019-03-21 MED ORDER — VANCOMYCIN HCL 750 MG/150ML IV SOLN
750.0000 mg | INTRAVENOUS | Status: DC
  Administered 2019-03-21 – 2019-03-22 (×2): 750 mg via INTRAVENOUS
  Filled 2019-03-21 (×3): qty 150

## 2019-03-21 MED ORDER — TRIAMCINOLONE ACETONIDE 0.1 % EX OINT
TOPICAL_OINTMENT | Freq: Three times a day (TID) | CUTANEOUS | Status: DC
  Filled 2019-03-21: qty 15

## 2019-03-21 MED ORDER — FLUOCINONIDE 0.05 % EX OINT
TOPICAL_OINTMENT | Freq: Two times a day (BID) | CUTANEOUS | Status: DC
  Filled 2019-03-21: qty 15

## 2019-03-21 MED ORDER — MIDAZOLAM HCL 2 MG/2ML IJ SOLN
INTRAMUSCULAR | Status: AC | PRN
  Administered 2019-03-21 (×3): 1 mg via INTRAVENOUS

## 2019-03-21 MED ORDER — BUTAMBEN-TETRACAINE-BENZOCAINE 2-2-14 % EX AERO
INHALATION_SPRAY | CUTANEOUS | Status: AC
  Administered 2019-03-21: 2
  Filled 2019-03-21: qty 5

## 2019-03-21 MED ORDER — MIDAZOLAM HCL 5 MG/5ML IJ SOLN
INTRAMUSCULAR | Status: AC
  Filled 2019-03-21: qty 5

## 2019-03-21 MED ORDER — SODIUM CHLORIDE 0.9 % IV SOLN: INTRAVENOUS | Status: DC

## 2019-03-21 MED ORDER — FENTANYL CITRATE (PF) 100 MCG/2ML IJ SOLN
INTRAMUSCULAR | Status: AC
  Filled 2019-03-21: qty 2

## 2019-03-21 MED ORDER — SODIUM CHLORIDE 0.9% IV SOLUTION: Freq: Once | INTRAVENOUS | Status: AC

## 2019-03-21 MED ORDER — FENTANYL CITRATE (PF) 100 MCG/2ML IJ SOLN
INTRAMUSCULAR | Status: AC | PRN
  Administered 2019-03-21 (×3): 25 ug via INTRAVENOUS

## 2019-03-21 MED ORDER — SODIUM CHLORIDE FLUSH 0.9 % IV SOLN
INTRAVENOUS | Status: AC
  Filled 2019-03-21: qty 10

## 2019-03-21 MED ORDER — FUROSEMIDE 40 MG PO TABS
40.0000 mg | ORAL_TABLET | Freq: Every day | ORAL | Status: DC
  Administered 2019-03-22 – 2019-03-26 (×5): 40 mg via ORAL
  Filled 2019-03-21 (×5): qty 1

## 2019-03-21 NOTE — Progress Notes (Signed)
PROGRESS NOTE    Brooke Baker  [id number] DOB: [birth year] DOA: 03/18/2019 PCP: Einar Pheasant, MD   Brief Narrative:  [name], type diabetes mellitus, hypertension, dyslipidemia and coronary artery disease, who presented to the emergency room with acute onset of fever with associated generalized weakness and dyspnea without significant cough and wheezing. She has been having orthopnea and paroxysmal nocturnal dyspnea as well as worsening lower extremity edema. She admits to dyspnea on exertion. She admitted to nausea and vomiting without abdominal pain.  On presentation febrile, chest x-ray with bilateral interstitial densities concerning for disease progression versus superimposed pneumonia. She was started on ceftriaxone and azithromycin. Blood cultures are growing staph aureus.  Antibiotics switched with vancomycin.  Subjective: Patient was feeling little better when seen today.  No new complaints.  Assessment & Plan:   Active Problems:   AF (paroxysmal atrial fibrillation) (HCC)   Macrocytic anemia   Chronic respiratory failure ([place])   Sepsis ([place])   Community acquired pneumonia  Sepsis secondary to MRSA bacteremia and bilateral pneumonia.  TTE and TEE was negative for vegetations.  Patient has hardware due to right hip and knee replacement and high risk for seeding.  Repeat blood culture drawn on 03/20/19 remain negative so far.  ID was consulted-recommending at least 4-week of vancomycin. -PICC line will be placed after repeat blood culture remain negative for 48 h. -ID is also recommending CT spine before discharge to rule out any osteomyelitis or discitis. -PT/OT are recommending SNF placement but patient wants to go home with home health services. -Continue supportive care with [facility].  Acute on chronic diastolic CHF, possibly cor  pulmonale.  She has a history of preserved EF of 55% with NYHA class II- III ACC/AHA stage C. Patient is on 2 L at home. She was initially diuresed with Lasix 40 mg twice daily, which was discontinued due to increased in creatinine. -Start her on p.o. Lasix 40 mg daily from tomorrow. -Continue to monitor. -Continue daily BMP, strict intake and output and daily weight. -Echocardiogram-with normal EF and no wall motion abnormalities.  Paroxysmal atrial fibrillation.  Currently regular and rate controlled. -Continue Toprol-XL and Pradaxa. -Cardiology is following and we appreciate their recommendations.  Hypertension.  Blood pressure within goal. -Continue home antihypertensives.  Macrocytic anemia.  Hemoglobin stable.  Patient follow-up with hematology.  She has rheumatoid factor positive psoriatic arthritis.  Labs were consistent with hypoproliferative bone marrow.  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  They were planning to do a bone marrow evaluation if hemoglobin continued to drop, the time of prior hematology visit her hemoglobin was 9.7.   Hemoglobin dropped to 7.2 today without any obvious bleeding.  After discussing with her hematologist's we will transfuse her with 1 unit of PRBC. -1 unit packed RBCs ordered. -Continue to monitor.  Thrombocytopenia.  Worsening thrombocytopenia with platelet of 71.  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  Patient is also on Pradaxa which increased her chances of bleeding. Discussed with Dr. Janese Banks who saw the patient and recommending continuation of Pradaxa at this time.  Pradaxa need to be discontinued if platelets drop below 50,000. -We will continue Pradaxa for now. -Continue to monitor platelets. -Monitor for any sign of bleeding.  History of seizure disorder.  No acute concern. -We will continue home antiseizure meds.  Dyslipidemia. -Continue  statin.  Depression. -Continue  Zoloft.  Objective: Vitals:   03/21/19 0941 03/21/19 1206 03/21/19 1425 03/21/19 1502  BP: (!) 106/46 (!) 129/47 (!) 113/58 (!) 127/48  Pulse: 95 96 (!) 105 (!) 104  Resp: 16 16    Temp: 98.7 F (37.1 C) 98.3 F (36.8 C) 98.6 F (37 C) 99.2 F (37.3 C)  TempSrc: Oral  Oral Oral  SpO2: 97%  100% 100%  Weight:      Height:        Intake/Output Summary (Last 24 hours) at 03/21/2019 1544 Last data filed at 03/21/2019 1444 Gross per 24 hour  Intake 10 ml  Output 300 ml  Net -290 ml   Filed Weights   03/18/19 2317 03/21/19 0547  Weight: 73.5 kg 108.2 kg    Examination:  General exam: Appears calm and comfortable  Respiratory system: Clear to auscultation. Respiratory effort normal. Cardiovascular system: S1 & S2 heard, RRR. No JVD, murmurs, rubs, gallops or clicks. Gastrointestinal system: Soft, nontender, nondistended, bowel sounds positive. Central nervous system: Alert and oriented. No focal neurological deficits.Symmetric 5 x 5 power. Extremities: 1+LE edema with signs of chronic venous dermatitis, 2+ upper extremity edema, worse on left with ecchymosis,pulses intact and symmetrical. Psychiatry: Judgement and insight appear normal. Mood & affect appropriate.    DVT prophylaxis: Pradaxa Code Status: Full Family Communication:  daughter Brooke Baker was updated on phone. Disposition Plan: Pending improvement .  Patient with MRSA bacteremia.  PT recommending SNF placement, patient wants to go home with home health services.  Should be able to go in 1 to 2 days.  Consultants:   Hematology  Cardiology  ID  Procedures:  Antimicrobials:  Vancomycin  Data Reviewed: I have personally reviewed following labs and imaging studies  CBC: Recent Labs  Lab 03/19/19 0036 03/19/19 0812 03/20/19 0450 03/21/19 0643  WBC 5.4 8.2 5.0 4.7  NEUTROABS 4.6  --   --  3.2  HGB 8.8* 7.7* 8.1* 7.2*  HCT 28.2* 24.1* 25.3* 22.5*  MCV 109.3* 109.5* 107.2*  109.2*  PLT 83* 72* 68* 71*   Basic Metabolic Panel: Recent Labs  Lab 03/19/19 0036 03/19/19 0812 03/20/19 0450 03/21/19 0643  NA 135 134* 133* 136  K 4.0 4.3 4.4 3.7  CL 106 109 105 107  CO2 21* 22 20* 23  GLUCOSE 126* 139* 117* 95  BUN 19 22 26* 32*  CREATININE 1.12* 1.26* 1.24* 1.36*  CALCIUM 7.5* 7.1* 7.4* 7.5*   GFR: Estimated Creatinine Clearance: 39.7 mL/min (A) (by C-G formula based on SCr of 1.36 mg/dL (H)). Liver Function Tests: Recent Labs  Lab 03/19/19 0036  AST 31  ALT 10  ALKPHOS 108  BILITOT 1.5*  PROT 7.4  ALBUMIN 2.4*   Recent Labs  Lab 03/19/19 0036  LIPASE 21   No results for input(s): AMMONIA in the last 168 hours. Coagulation Profile: Recent Labs  Lab 03/19/19 0036 03/21/19 0643  INR 1.7* 2.1*   Cardiac Enzymes: No results for input(s): CKTOTAL, CKMB, CKMBINDEX, TROPONINI in the last 168 hours. BNP (last 3 results) No results for input(s): PROBNP in the last 8760 hours. HbA1C: No results for input(s): HGBA1C in the last 72 hours. CBG: Recent Labs  Lab 03/20/19 1631 03/20/19 2120 03/21/19 0750 03/21/19 0916 03/21/19 1205  GLUCAP 89 81 83 77 81   Lipid Profile: No results for input(s): CHOL, HDL, LDLCALC, TRIG, CHOLHDL, LDLDIRECT in the last 72 hours. Thyroid Function Tests: No results for input(s): TSH, T4TOTAL, FREET4, T3FREE, THYROIDAB in the last 72 hours. Anemia  Panel: Recent Labs    03/20/19 1325  VITAMINB12 859  FOLATE 4.4*  FERRITIN 168  TIBC 165*  IRON 32  RETICCTPCT 2.0   Sepsis Labs: Recent Labs  Lab 03/19/19 0036 03/19/19 0812  PROCALCITON 0.44  --   LATICACIDVEN 1.7 1.7    Recent Results (from the past 240 hour(s))  Respiratory Panel by RT PCR (Flu A&B, Covid) - Nasopharyngeal Swab     Status: None   Collection Time: 03/19/19 12:32 AM   Specimen: Nasopharyngeal Swab  Result Value Ref Range Status   SARS Coronavirus 2 by RT PCR NEGATIVE NEGATIVE Final    Comment: (NOTE) SARS-CoV-2 target nucleic  acids are NOT DETECTED. The SARS-CoV-2 RNA is generally detectable in upper respiratoy specimens during the acute phase of infection. The lowest concentration of SARS-CoV-2 viral copies this assay can detect is 131 copies/mL. A negative result does not preclude SARS-Cov-2 infection and should not be used as the sole basis for treatment or other patient management decisions. A negative result may occur with  improper specimen collection/handling, submission of specimen other than nasopharyngeal swab, presence of viral mutation(s) within the areas targeted by this assay, and inadequate number of viral copies (<131 copies/mL). A negative result must be combined with clinical observations, patient history, and epidemiological information. The expected result is Negative. Fact Sheet for Patients:  PinkCheek.be Fact Sheet for Healthcare Providers:  GravelBags.it This test is not yet ap proved or cleared by the [place] FDA and  has been authorized for detection and/or diagnosis of SARS-CoV-2 by FDA under an Emergency Use Authorization (EUA). This EUA will remain  in effect (meaning this test can be used) for the duration of the COVID-19 declaration under Section 564(b)(1) of the Act, 21 U.S.C. section 360bbb-3(b)(1), unless the authorization is terminated or revoked sooner.    Influenza A by PCR NEGATIVE NEGATIVE Final   Influenza B by PCR NEGATIVE NEGATIVE Final    Comment: (NOTE) The Xpert Xpress SARS-CoV-2/FLU/RSV assay is intended as an aid in  the diagnosis of influenza from Nasopharyngeal swab specimens and  should not be used as a sole basis for treatment. Nasal washings and  aspirates are unacceptable for Xpert Xpress SARS-CoV-2/FLU/RSV  testing. Fact Sheet for Patients: PinkCheek.be Fact Sheet for Healthcare Providers: GravelBags.it This test is not yet  approved or cleared by the [place] FDA and  has been authorized for detection and/or diagnosis of SARS-CoV-2 by  FDA under an Emergency Use Authorization (EUA). This EUA will remain  in effect (meaning this test can be used) for the duration of the  Covid-19 declaration under Section 564(b)(1) of the Act, 21  U.S.C. section 360bbb-3(b)(1), unless the authorization is  terminated or revoked. Performed at [facility], [address] [place], [place] [address]   Blood Culture (routine x 2)     Status: Abnormal   Collection Time: 03/19/19 12:36 AM   Specimen: BLOOD  Result Value Ref Range Status   Specimen Description   Final    BLOOD LEFT ANTECUBITAL Performed at [facility], [address] [place], [place] [address]    Special Requests   Final    BOTTLES DRAWN AEROBIC AND ANAEROBIC Blood Culture adequate volume Performed at [facility], [address] [place], [place] [address]    Culture  Setup Time   Final    GRAM POSITIVE COCCI IN BOTH AEROBIC AND ANAEROBIC BOTTLES CRITICAL RESULT CALLED TO, READ BACK BY AND VERIFIED WITH: ALEX CHAPPELL 03/19/19 @  [address] [place] RESISTANT STAPHYLOCOCCUS AUREUS (A)  Final   Report Status 03/21/2019 FINAL  Final   Organism ID, Bacteria METHICILLIN RESISTANT STAPHYLOCOCCUS AUREUS  Final      Susceptibility   Methicillin resistant staphylococcus aureus - MIC*    CIPROFLOXACIN >=8 RESISTANT Resistant     ERYTHROMYCIN >=8 RESISTANT Resistant     GENTAMICIN <=0.5 SENSITIVE Sensitive     OXACILLIN >=4 RESISTANT Resistant     TETRACYCLINE <=1 SENSITIVE Sensitive     VANCOMYCIN <=0.5 SENSITIVE Sensitive     TRIMETH/SULFA <=10 SENSITIVE Sensitive     CLINDAMYCIN <=0.25 SENSITIVE Sensitive     RIFAMPIN <=0.5 SENSITIVE Sensitive     Inducible Clindamycin NEGATIVE Sensitive     * METHICILLIN RESISTANT STAPHYLOCOCCUS AUREUS  MRSA PCR Screening     Status: Abnormal   Collection Time: 03/19/19  12:25 PM   Specimen: Urine, Clean Catch; Nasopharyngeal  Result Value Ref Range Status   MRSA by PCR POSITIVE (A) NEGATIVE Final    Comment:        The GeneXpert MRSA Assay (FDA approved for NASAL specimens only), is one component of a comprehensive MRSA colonization surveillance program. It is not intended to diagnose MRSA infection nor to guide or monitor treatment for MRSA infections. RESULT CALLED TO, READ BACK BY AND VERIFIED WITH: DENISE [facility] AT [id number] 03/19/19.PMF Performed at [facility], [address] [place], [place] [address]   CULTURE, BLOOD (ROUTINE X 2) w Reflex to ID Panel     Status: None (Preliminary result)   Collection Time: 03/20/19  5:40 PM   Specimen: BLOOD  Result Value Ref Range Status   Specimen Description BLOOD LEFT ANTECUBITAL  Final   Special Requests   Final    BOTTLES DRAWN AEROBIC AND ANAEROBIC Blood Culture adequate volume   Culture   Final    NO GROWTH < 24 HOURS Performed at [facility], [address] [place], [place] [address]    Report Status PENDING  Incomplete  CULTURE, BLOOD (ROUTINE X 2) w Reflex to ID Panel     Status: None (Preliminary result)   Collection Time: 03/20/19  5:40 PM   Specimen: BLOOD  Result Value Ref Range Status   Specimen Description BLOOD BLOOD LEFT HAND  Final   Special Requests   Final    BOTTLES DRAWN AEROBIC AND ANAEROBIC Blood Culture adequate volume   Culture   Final    NO GROWTH < 24 HOURS Performed at [facility], [address] [place], [place] [address]    Report Status PENDING  Incomplete     Radiology Studies: CT HEAD WO CONTRAST  Result Date: 03/19/2019 CLINICAL DATA:  85 year old female with fall. EXAM: CT HEAD WITHOUT CONTRAST TECHNIQUE: Contiguous axial images were obtained from the base of the skull through the vertex without intravenous contrast. COMPARISON:  Head CT dated 09/08/2018. FINDINGS: Brain: There is moderate age-related atrophy and chronic  microvascular ischemic changes. There is no acute intracranial hemorrhage. No mass effect or midline shift. No extra-axial fluid collection. Vascular: No hyperdense vessel or unexpected calcification. Skull: Normal. Negative for fracture or focal lesion. Sinuses/Orbits: There is opacification of the left frontal sinuses and maxillary sinuses with remodeling of the medial wall of the right maxillary sinus. Opacification of the left sphenoid sinuses. No air-fluid level. The mastoid air cells are clear. Other: None IMPRESSION: 1. No acute intracranial pathology. 2. Age-related atrophy and chronic microvascular ischemic changes. 3. Chronic paranasal sinus  disease. Electronically Signed   By: Anner Crete M.D.   On: 03/19/2019 22:31   ECHOCARDIOGRAM COMPLETE  Result Date: 03/20/2019    ECHOCARDIOGRAM REPORT   Patient Name:   Brooke Baker Date of Exam: 03/20/2019 Medical Rec #:  [id number]        Height:       66.0 in Accession #:    [id number]       Weight:       162.0 lb Date of Birth:  Apr 11, 1934        BSA:          1.828 m Patient Age:    19 years         BP:           57/93 mmHg Patient Gender: F                HR:           114 bpm. Exam Location:  [facility] Procedure: 2D Echo, Cardiac Doppler and Color Doppler Indications:     Dyspnea 786.09  History:         Patient has no prior history of Echocardiogram examinations.                  CAD, COPD; Risk Factors:Hypertension. CVA.  Sonographer:     Sherrie Sport RDCS (AE) Referring Phys:  [id number] [facility] Nashalie Sallis Diagnosing Phys: Bartholome Bill MD IMPRESSIONS  1. Left ventricular ejection fraction, by estimation, is 70 to 75%. Left ventricular ejection fraction by PLAX is 75 %. The left ventricle has hyperdynamic function. The left ventricle has no regional wall motion abnormalities. Left ventricular diastolic parameters are consistent with Grade I diastolic dysfunction (impaired relaxation).  2. Right ventricular systolic function is normal. The right ventricular size  is mildly enlarged. There is moderately elevated pulmonary artery systolic pressure.  3. Left atrial size was mildly dilated.  4. Right atrial size was mildly dilated.  5. The mitral valve is grossly normal. Mild mitral valve regurgitation.  6. The aortic valve is grossly normal. Aortic valve regurgitation is trivial. FINDINGS  Left Ventricle: Left ventricular ejection fraction, by estimation, is 70 to 75%. Left ventricular ejection fraction by PLAX is 75 % The left ventricle has hyperdynamic function. The left ventricle has no regional wall motion abnormalities. The left ventricular internal cavity size was normal in size. There is borderline left ventricular hypertrophy. Left ventricular diastolic parameters are consistent with Grade I diastolic dysfunction (impaired relaxation). Right Ventricle: The right ventricular size is mildly enlarged. No increase in right ventricular wall thickness. Right ventricular systolic function is normal. There is moderately elevated pulmonary artery systolic pressure. The tricuspid regurgitant velocity is 3.27 m/s, and with an assumed right atrial pressure of 10 mmHg, the estimated right ventricular systolic pressure is 123XX123 mmHg. Left Atrium: Left atrial size was mildly dilated. Right Atrium: Right atrial size was mildly dilated. Pericardium: There is no evidence of pericardial effusion. Mitral Valve: The mitral valve is grossly normal. Mild mitral valve regurgitation. Tricuspid Valve: The tricuspid valve is grossly normal. Tricuspid valve regurgitation is mild. Aortic Valve: The aortic valve is grossly normal. Aortic valve regurgitation is trivial. Aortic valve mean gradient measures 3.5 mmHg. Aortic valve peak gradient measures 7.2 mmHg. Aortic valve area, by VTI measures 2.48 cm. Pulmonic Valve: The pulmonic valve was grossly normal. Pulmonic valve regurgitation is mild. Aorta: The aortic root was not well visualized. IAS/Shunts: The atrial septum is grossly normal.  LEFT  VENTRICLE  PLAX 2D LV EF:         Left ventricular ejection fraction by PLAX is 75 % LVIDd:         3.69 cm LVIDs:         2.09 cm LV PW:         1.35 cm LV IVS:        0.78 cm LVOT diam:     2.00 cm LV SV:         58 LV SV Index:   32 LVOT Area:     3.14 cm  RIGHT VENTRICLE RV Basal diam:  4.34 cm LEFT ATRIUM            Index       RIGHT ATRIUM           Index LA diam:      4.70 cm  2.57 cm/m  RA Area:     24.40 cm LA Vol (A2C): 158.0 ml 86.41 ml/m RA Volume:   87.20 ml  47.69 ml/m LA Vol (A4C): 52.8 ml  28.88 ml/m  AORTIC VALVE                   PULMONIC VALVE AV Area (Vmax):    2.20 cm    PV Vmax:        0.71 m/s AV Area (Vmean):   2.43 cm    PV Peak grad:   2.0 mmHg AV Area (VTI):     2.48 cm    RVOT Peak grad: 2 mmHg AV Vmax:           134.00 cm/s AV Vmean:          87.550 cm/s AV VTI:            0.234 m AV Peak Grad:      7.2 mmHg AV Mean Grad:      3.5 mmHg LVOT Vmax:         93.80 cm/s LVOT Vmean:        67.600 cm/s LVOT VTI:          0.185 m LVOT/AV VTI ratio: 0.79  AORTA Ao Root diam: 3.10 cm MITRAL VALVE                TRICUSPID VALVE MV Area (PHT): 4.89 cm     TR Peak grad:   42.8 mmHg MV Decel Time: 155 msec     TR Vmax:        327.00 cm/s MV E velocity: 127.00 cm/s MV A velocity: 128.00 cm/s  SHUNTS MV E/A ratio:  0.99         Systemic VTI:  0.18 m                             Systemic Diam: 2.00 cm Bartholome Bill MD Electronically signed by Bartholome Bill MD Signature Date/Time: 03/20/2019/4:00:44 PM    Final    ECHO TEE  Result Date: 03/21/2019    TRANSESOPHOGEAL ECHO REPORT   Patient Name:   Brooke Baker Date of Exam: 03/21/2019 Medical Rec #:  [id number]        Height:       66.0 in Accession #:    [id number]       Weight:       238.5 lb Date of Birth:  11-01-1934        BSA:  2.155 m Patient Age:    37 years         BP:           107/40 mmHg Patient Gender: F                HR:           111 bpm. Exam Location:  [facility] Procedure: Transesophageal Echo, Color Doppler and Cardiac  Doppler Indications:     SBE subacute bacterial endocarditis 421.0  History:         Patient has prior history of Echocardiogram examinations, most                  recent 03/20/2019. COPD; Risk Factors:Hypertension and Diabetes.                  CVA.  Sonographer:     Sherrie Sport RDCS (AE) Referring Phys:  [place] Diagnosing Phys: Bartholome Bill MD PROCEDURE: TEE procedure time was 16 minutes. The transesophogeal probe was passed without difficulty through the esophogus of the patient. Imaged were obtained with the patient in a left lateral decubitus position. Local oropharyngeal anesthetic was provided with viscous lidocaine and Benzocaine spray. Sedation performed by performing physician. Image quality was excellent. The patient's vital signs; including heart rate, blood pressure, and oxygen saturation; remained stable throughout the procedure. The patient developed no complications during the procedure. IMPRESSIONS  1. Left ventricular ejection fraction, by estimation, is 55 to 60%. The left ventricle has normal function. The left ventricle has no regional wall motion abnormalities.  2. Right ventricular systolic function is normal. The right ventricular size is mildly enlarged.  3. No left atrial/left atrial appendage thrombus was detected.  4. Right atrial size was moderately dilated.  5. The mitral valve is grossly normal. Mild mitral valve regurgitation.  6. Tricuspid valve regurgitation is mild to moderate.  7. The aortic valve is tricuspid. Aortic valve regurgitation is trivial. No aortic stenosis is present. Conclusion(s)/Recommendation(s): No evidence of vegetation/infective endocarditis on this transesophageal echocardiogram. FINDINGS  Left Ventricle: Left ventricular ejection fraction, by estimation, is 55 to 60%. The left ventricle has normal function. The left ventricle has no regional wall motion abnormalities. The left ventricular internal cavity size was normal in size. There is  no left  ventricular hypertrophy. Right Ventricle: The right ventricular size is mildly enlarged. No increase in right ventricular wall thickness. Right ventricular systolic function is normal. Left Atrium: Left atrial size was normal in size. No left atrial/left atrial appendage thrombus was detected. Right Atrium: Right atrial size was moderately dilated. Pericardium: There is no evidence of pericardial effusion. Mitral Valve: The mitral valve is grossly normal. Mild mitral valve regurgitation. There is no evidence of mitral valve vegetation. Tricuspid Valve: The tricuspid valve is grossly normal. Tricuspid valve regurgitation is mild to moderate. Aortic Valve: The aortic valve is tricuspid. Aortic valve regurgitation is trivial. No aortic stenosis is present. There is no evidence of aortic valve vegetation. Pulmonic Valve: The pulmonic valve was not well visualized. Pulmonic valve regurgitation is trivial. Aorta: The aortic root is normal in size and structure. IAS/Shunts: The atrial septum is grossly normal. Bartholome Bill MD Electronically signed by Bartholome Bill MD Signature Date/Time: 03/21/2019/10:50:39 AM    Final     Scheduled Meds: . aspirin EC  81 mg Oral Daily  . azithromycin  500 mg Oral Daily  . Chlorhexidine Gluconate Cloth  6 each Topical Daily  . fluocinonide ointment  Topical BID  . fluticasone  2 spray Each Nare Daily  . folic acid  1 mg Oral Daily  . gabapentin  100 mg Oral BID  . insulin aspart  0-9 Units Subcutaneous TID PC & HS  . levETIRAcetam  750 mg Oral BID  . metoprolol succinate  50 mg Oral Daily  . mupirocin ointment   Topical BID  . nystatin-triamcinolone   Topical BID  . omega-3 acid ethyl esters  1 g Oral Daily  . phenytoin  400 mg Oral QHS  . polyethylene glycol  17 g Oral Daily  . rosuvastatin  20 mg Oral Daily  . sertraline  75 mg Oral Daily  . sodium chloride flush  10-40 mL Intracatheter Q12H  . triamcinolone ointment   Topical TID   Continuous Infusions: .  vancomycin       LOS: 2 days   Time spent: 45 minutes.  Lorella Nimrod, MD [facility]  If 7PM-7AM, please contact night-coverage [email or web address]  03/21/2019, 3:44 PM   This record has been created using Systems analyst. Errors have been sought and corrected,but may not always be located. Such creation errors do not reflect on the standard of care.

## 2019-03-21 NOTE — Consult Note (Signed)
Pharmacy Antibiotic Note  Brooke Baker is a 85 y.o. female admitted on 03/18/2019 with sepsis. CXR impression of progression of lung disease versus superimposed pneumonia. Pharmacy has been consulted for vancomycin dosing. Patient is also on azithromycin. Patient was febrile to 103 on presentation which has resolved.   Tmax 24h 100.42F. No leukocytosis but patient with possible hypo proliferative process per hematology . Procalcitonin 0.44. CRP 21.2.   Blood cultures with 2/4 bottles (same set) MRSA. Repeat blood cultures 3/15 no growth x 24h. Infectious diseases following. ECHO pending.   Plan:  Vancomycin 750 mg IV Q 24 hrs. Goal AUC 400-550. Expected AUC: 475.1, trough 14.1 SCr used: 1.36  Scr trending up since admission (1.12 >> 1.36). Continue to follow daily Scr while on vancomycin. Levels at steady state. Patient will be on prolonged therapy for 4-6 weeks per ID.   Height: 5\' 6"  (167.6 cm) Weight: 238 lb 8.6 oz (108.2 kg) IBW/kg (Calculated) : 59.3  Temp (24hrs), Avg:99.3 F (37.4 C), Min:98.2 F (36.8 C), Max:100.1 F (37.8 C)  Recent Labs  Lab 03/19/19 0036 03/19/19 0812 03/20/19 0450 03/21/19 0643  WBC 5.4 8.2 5.0 4.7  CREATININE 1.12* 1.26* 1.24* 1.36*  LATICACIDVEN 1.7 1.7  --   --     Estimated Creatinine Clearance: 39.7 mL/min (A) (by C-G formula based on SCr of 1.36 mg/dL (H)).    Allergies  Allergen Reactions  . Doxycycline Diarrhea and Nausea Only  . Methotrexate Rash    Antimicrobials this admission: Cefepime 3/14 x1 Vancomycin 3/14 >>  Ceftriaxone 3/14 >> 3/15 Azithromycin 3/14 >>  Dose adjustments this admission: n/a  Microbiology results: 3/15 BCx: No growth < 24h 3/14 BCx: 2/4 bottles (same set) MRSA 3/14 MRSA PCR: positive  Thank you for allowing pharmacy to be a part of this patient's care.  [place] Resident 03/21/2019 8:28 AM

## 2019-03-21 NOTE — Procedures (Signed)
   TRANSESOPHAGEAL ECHOCARDIOGRAM   NAME:  SKYLAR MAROLDA   MRN: [id number] DOB:  1934/02/16   ADMIT DATE: 03/18/2019  INDICATIONS:  Bacteremia PROCEDURE:   Informed consent was obtained prior to the procedure. The risks, benefits and alternatives for the procedure were discussed and the patient comprehended these risks.  Risks include, but are not limited to, cough, sore throat, vomiting, nausea, somnolence, esophageal and stomach trauma or perforation, bleeding, low blood pressure, aspiration, pneumonia, infection, trauma to the teeth and death.    After a procedural time-out, the patient was given 3 mg versed and 75 mcg fentanyl to achieve moderate sedation for 16 min.  The oropharynx was anesthetized 3 cc of topical 1% viscous lidocaine.  The transesophageal probe was inserted in the esophagus and stomach without difficulty and multiple views were obtained. I was present for the entire procedure.    COMPLICATIONS:    There were no immediate complications.  FINDINGS:  LEFT VENTRICLE: EF = 55%. No regional wall motion abnormalities.  RIGHT VENTRICLE: Normal size and function.   LEFT ATRIUM: Normal size and function  LEFT ATRIAL APPENDAGE: No thrombus.   RIGHT ATRIUM: Moderately dilated  AORTIC VALVE:  Trileaflet. Trivial AI. No vegetations  MITRAL VALVE:    Normal. Mild mr with no vegetations  TRICUSPID VALVE: Normal. Mild to moderate TR. No vegetations  PULMONIC VALVE: Grossly normal.  INTERATRIAL SEPTUM: No PFO or ASD noted with doppler  PERICARDIUM: No effusion  DESCENDING AORTA: Normal  CONCLUSION: No evidence of vegetations. Mild mr and mild to mod tr.

## 2019-03-21 NOTE — Progress Notes (Signed)
OT Cancellation Note  Patient Details Name: YUN BADDERS MRN: [id number] DOB: August 05, 1934   Cancelled Treatment:    Reason Eval/Treat Not Completed: Patient at procedure or test/ unavailable;Other (comment);Medical issues which prohibited therapy   Order received and chart reviewed.  Per recent lab values, Hgb at 7.2 and HCT 22.5 presenting below guidelines for activity from therapy. Pt received transesophageal echocardiogram this AM. Will follow up as appropriate and able.  Thank you.    Oren Binet 03/21/2019, 9:10 AM

## 2019-03-21 NOTE — Progress Notes (Signed)
Patient and daughter c/o left arm red and swollen noticeably more than right, upon assessment, arm is noticeably swollen. MD notified. Patients LUE elevated on pillows to help with swelling.

## 2019-03-21 NOTE — Progress Notes (Signed)
PT Cancellation Note  Patient Details Name: ALISI HOVEY MRN: [id number] DOB: [birth year]   Cancelled Treatment:    Reason Eval/Treat Not Completed: Patient at procedure or test/unavailable: Pt with Hgb at 7.2 and trending down now receiving blood transfusion.  Will attempt to see pt at a future date/time as medically appropriate.     Linus Salmons PT, DPT 03/21/19, 4:25 PM

## 2019-03-21 NOTE — Progress Notes (Signed)
Patient Name: Brooke Baker Date of Encounter: 03/21/2019  Hospital Problem List     Active Problems:   AF (paroxysmal atrial fibrillation) (HCC)   Macrocytic anemia   Chronic respiratory failure ([place])   Sepsis ([place])   Community acquired pneumonia    Patient Profile     85 y.o. female with history of coronary artery disease status post PCI of the proximal LAD in 2003, status post PCI of an OM1 in 2006, history of paroxysmal atrial fibrillation treated with anticoagulation with Pradaxa, hypertension, hyperlipidemia, history of TIAs, history of diastolic congestive heart failure [place] heart association class II-III ACC/AHA stage C, HFpEF with an EF of 55%.  Echocardiogram in 2018 showed an EF of 50 to 55% with moderate TR mild MR and pulmonary regurgitation with mild pulmonary hypertension.  This was unchanged from 2012.  She presented to emergency room complaining of shortness of breath.  She was hemodynamically stable but had a temperature of 103 with a heart rate of 117.  Pulse ox of 97% on room air.  Laboratory showed a CO2 of 21 BUN and creatinine of 19 and 1.12 respectively.  Her BNP was 608.  Hemoglobin was 8.8 with hematocrit of 28.2 and a platelet count of 83,000.  EKG showed atrial fibrillation with rapid ventricular response.  1 of 2 blood cultures showed Staph aureus.  Chest x-ray showed interval progression of interstitial densities consistent with progression of lung disease versus pneumonia.  She had a functional study done in January 2021 showing no evidence of ischemia with preserved LV function  Subjective   Less fatigued today. Underwent tee without complication. No evidence of vegetations.   Inpatient Medications    . sodium chloride   Intravenous Once  . aspirin EC  81 mg Oral Daily  . azithromycin  500 mg Oral Daily  . Chlorhexidine Gluconate Cloth  6 each Topical Daily  . fluticasone  2 spray Each Nare Daily  . folic acid  1 mg Oral Daily  . gabapentin  100  mg Oral BID  . insulin aspart  0-9 Units Subcutaneous TID PC & HS  . levETIRAcetam  750 mg Oral BID  . metoprolol succinate  50 mg Oral Daily  . mupirocin ointment   Topical BID  . nystatin-triamcinolone   Topical BID  . omega-3 acid ethyl esters  1 g Oral Daily  . phenytoin  400 mg Oral QHS  . polyethylene glycol  17 g Oral Daily  . rosuvastatin  20 mg Oral Daily  . sertraline  75 mg Oral Daily  . sodium chloride flush  10-40 mL Intracatheter Q12H    Vital Signs    Vitals:   03/21/19 0900 03/21/19 0910 03/21/19 0941 03/21/19 1206  BP: (!) 105/45 (!) 93/40 (!) 106/46 (!) 129/47  Pulse: (!) 106 (!) 104 95 96  Resp: 16 13 16 16   Temp:   98.7 F (37.1 C) 98.3 F (36.8 C)  TempSrc:   Oral   SpO2: 100% 100% 97%   Weight:      Height:        Intake/Output Summary (Last 24 hours) at 03/21/2019 1216 Last data filed at 03/21/2019 0222 Gross per 24 hour  Intake 10 ml  Output 300 ml  Net -290 ml   Filed Weights   03/18/19 2317 03/21/19 0547  Weight: 73.5 kg 108.2 kg    Physical Exam    GEN: Well nourished, well developed, in no acute distress.  HEENT: normal.  Neck: Supple, no JVD, carotid bruits, or masses. Cardiac: RRR, no murmurs, rubs, or gallops. No clubbing, cyanosis, edema.  Radials/DP/PT 2+ and equal bilaterally.  Respiratory:  Respirations regular and unlabored, clear to auscultation bilaterally. GI: Soft, nontender, nondistended, BS + x 4. MS: no deformity or atrophy. Skin: warm and dry, no rash. Neuro:  Strength and sensation are intact. Psych: Normal affect.  Labs    CBC Recent Labs    03/19/19 0036 03/19/19 0812 03/20/19 0450 03/21/19 0643  WBC 5.4   < > 5.0 4.7  NEUTROABS 4.6  --   --  3.2  HGB 8.8*   < > 8.1* 7.2*  HCT 28.2*   < > 25.3* 22.5*  MCV 109.3*   < > 107.2* 109.2*  PLT 83*   < > 68* 71*   < > = values in this interval not displayed.   Basic Metabolic Panel Recent Labs    03/20/19 0450 03/21/19 0643  NA 133* 136  K 4.4 3.7  CL  105 107  CO2 20* 23  GLUCOSE 117* 95  BUN 26* 32*  CREATININE 1.24* 1.36*  CALCIUM 7.4* 7.5*   Liver Function Tests Recent Labs    03/19/19 0036  AST 31  ALT 10  ALKPHOS 108  BILITOT 1.5*  PROT 7.4  ALBUMIN 2.4*   Recent Labs    03/19/19 0036  LIPASE 21   Cardiac Enzymes No results for input(s): CKTOTAL, CKMB, CKMBINDEX, TROPONINI in the last 72 hours. BNP Recent Labs    03/19/19 0036  BNP 608.0*   D-Dimer No results for input(s): DDIMER in the last 72 hours. Hemoglobin A1C No results for input(s): HGBA1C in the last 72 hours. Fasting Lipid Panel No results for input(s): CHOL, HDL, LDLCALC, TRIG, CHOLHDL, LDLDIRECT in the last 72 hours. Thyroid Function Tests No results for input(s): TSH, T4TOTAL, T3FREE, THYROIDAB in the last 72 hours.  Invalid input(s): FREET3  Telemetry    afib with variable vr  ECG    afib  Radiology    CT HEAD WO CONTRAST  Result Date: 03/19/2019 CLINICAL DATA:  85 year old female with fall. EXAM: CT HEAD WITHOUT CONTRAST TECHNIQUE: Contiguous axial images were obtained from the base of the skull through the vertex without intravenous contrast. COMPARISON:  Head CT dated 09/08/2018. FINDINGS: Brain: There is moderate age-related atrophy and chronic microvascular ischemic changes. There is no acute intracranial hemorrhage. No mass effect or midline shift. No extra-axial fluid collection. Vascular: No hyperdense vessel or unexpected calcification. Skull: Normal. Negative for fracture or focal lesion. Sinuses/Orbits: There is opacification of the left frontal sinuses and maxillary sinuses with remodeling of the medial wall of the right maxillary sinus. Opacification of the left sphenoid sinuses. No air-fluid level. The mastoid air cells are clear. Other: None IMPRESSION: 1. No acute intracranial pathology. 2. Age-related atrophy and chronic microvascular ischemic changes. 3. Chronic paranasal sinus disease. Electronically Signed   By: Anner Crete M.D.   On: 03/19/2019 22:31   DG Chest Port 1 View  Result Date: 03/18/2019 CLINICAL DATA:  85 year old female with sepsis. EXAM: PORTABLE CHEST 1 VIEW COMPARISON:  Chest radiograph dated 04/20/2017. FINDINGS: There is chronic interstitial coarsening and bronchitic changes. Overall slight interval progression of interstitial densities since the prior radiograph which may represent progression of background of lung disease although superimposed pneumonia is not excluded. Clinical correlation is recommended. No focal consolidation or pneumothorax. Trace bilateral pleural effusions may be present. There is mild cardiomegaly. Atherosclerotic calcification of the aorta.  No acute osseous pathology. IMPRESSION: Slight interval progression of interstitial densities may represent progression of lung disease versus superimposed pneumonia. Electronically Signed   By: Anner Crete M.D.   On: 03/18/2019 23:43   ECHOCARDIOGRAM COMPLETE  Result Date: 03/20/2019    ECHOCARDIOGRAM REPORT   Patient Name:   LAKESA HARKLESS Date of Exam: 03/20/2019 Medical Rec #:  [id number]        Height:       66.0 in Accession #:    [id number]       Weight:       162.0 lb Date of Birth:  [birth year]        BSA:          1.828 m Patient Age:    84 years         BP:           57/93 mmHg Patient Gender: F                HR:           114 bpm. Exam Location:  [facility] Procedure: 2D Echo, Cardiac Doppler and Color Doppler Indications:     Dyspnea 786.09  History:         Patient has no prior history of Echocardiogram examinations.                  CAD, COPD; Risk Factors:Hypertension. CVA.  Sonographer:     Sherrie Sport RDCS (AE) Referring Phys:  [id number] [facility] AMIN Diagnosing Phys: Bartholome Bill MD IMPRESSIONS  1. Left ventricular ejection fraction, by estimation, is 70 to 75%. Left ventricular ejection fraction by PLAX is 75 %. The left ventricle has hyperdynamic function. The left ventricle has no regional wall motion abnormalities. Left  ventricular diastolic parameters are consistent with Grade I diastolic dysfunction (impaired relaxation).  2. Right ventricular systolic function is normal. The right ventricular size is mildly enlarged. There is moderately elevated pulmonary artery systolic pressure.  3. Left atrial size was mildly dilated.  4. Right atrial size was mildly dilated.  5. The mitral valve is grossly normal. Mild mitral valve regurgitation.  6. The aortic valve is grossly normal. Aortic valve regurgitation is trivial. FINDINGS  Left Ventricle: Left ventricular ejection fraction, by estimation, is 70 to 75%. Left ventricular ejection fraction by PLAX is 75 % The left ventricle has hyperdynamic function. The left ventricle has no regional wall motion abnormalities. The left ventricular internal cavity size was normal in size. There is borderline left ventricular hypertrophy. Left ventricular diastolic parameters are consistent with Grade I diastolic dysfunction (impaired relaxation). Right Ventricle: The right ventricular size is mildly enlarged. No increase in right ventricular wall thickness. Right ventricular systolic function is normal. There is moderately elevated pulmonary artery systolic pressure. The tricuspid regurgitant velocity is 3.27 m/s, and with an assumed right atrial pressure of 10 mmHg, the estimated right ventricular systolic pressure is 123XX123 mmHg. Left Atrium: Left atrial size was mildly dilated. Right Atrium: Right atrial size was mildly dilated. Pericardium: There is no evidence of pericardial effusion. Mitral Valve: The mitral valve is grossly normal. Mild mitral valve regurgitation. Tricuspid Valve: The tricuspid valve is grossly normal. Tricuspid valve regurgitation is mild. Aortic Valve: The aortic valve is grossly normal. Aortic valve regurgitation is trivial. Aortic valve mean gradient measures 3.5 mmHg. Aortic valve peak gradient measures 7.2 mmHg. Aortic valve area, by VTI measures 2.48 cm. Pulmonic Valve:  The pulmonic valve was grossly normal. Pulmonic valve regurgitation is  mild. Aorta: The aortic root was not well visualized. IAS/Shunts: The atrial septum is grossly normal.  LEFT VENTRICLE PLAX 2D LV EF:         Left ventricular ejection fraction by PLAX is 75 % LVIDd:         3.69 cm LVIDs:         2.09 cm LV PW:         1.35 cm LV IVS:        0.78 cm LVOT diam:     2.00 cm LV SV:         58 LV SV Index:   32 LVOT Area:     3.14 cm  RIGHT VENTRICLE RV Basal diam:  4.34 cm LEFT ATRIUM            Index       RIGHT ATRIUM           Index LA diam:      4.70 cm  2.57 cm/m  RA Area:     24.40 cm LA Vol (A2C): 158.0 ml 86.41 ml/m RA Volume:   87.20 ml  47.69 ml/m LA Vol (A4C): 52.8 ml  28.88 ml/m  AORTIC VALVE                   PULMONIC VALVE AV Area (Vmax):    2.20 cm    PV Vmax:        0.71 m/s AV Area (Vmean):   2.43 cm    PV Peak grad:   2.0 mmHg AV Area (VTI):     2.48 cm    RVOT Peak grad: 2 mmHg AV Vmax:           134.00 cm/s AV Vmean:          87.550 cm/s AV VTI:            0.234 m AV Peak Grad:      7.2 mmHg AV Mean Grad:      3.5 mmHg LVOT Vmax:         93.80 cm/s LVOT Vmean:        67.600 cm/s LVOT VTI:          0.185 m LVOT/AV VTI ratio: 0.79  AORTA Ao Root diam: 3.10 cm MITRAL VALVE                TRICUSPID VALVE MV Area (PHT): 4.89 cm     TR Peak grad:   42.8 mmHg MV Decel Time: 155 msec     TR Vmax:        327.00 cm/s MV E velocity: 127.00 cm/s MV A velocity: 128.00 cm/s  SHUNTS MV E/A ratio:  0.99         Systemic VTI:  0.18 m                             Systemic Diam: 2.00 cm Bartholome Bill MD Electronically signed by Bartholome Bill MD Signature Date/Time: 03/20/2019/4:00:44 PM    Final    ECHO TEE  Result Date: 03/21/2019    TRANSESOPHOGEAL ECHO REPORT   Patient Name:   MEYLIN VISONE Date of Exam: 03/21/2019 Medical Rec #:  [id number]        Height:       66.0 in Accession #:    [id number]       Weight:       238.5 lb Date of Birth:  06-24-1934        BSA:          2.155 m Patient Age:    10  years         BP:           107/40 mmHg Patient Gender: F                HR:           111 bpm. 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 COPD; Risk Factors:Hypertension and Diabetes.                  CVA.  Sonographer:     Sherrie Sport RDCS (AE) Referring Phys:  [place] Diagnosing Phys: Bartholome Bill MD PROCEDURE: TEE procedure time was 16 minutes. The transesophogeal probe was passed without difficulty through the esophogus of the patient. Imaged were obtained with the patient in a left lateral decubitus position. Local oropharyngeal anesthetic was provided with viscous lidocaine and Benzocaine spray. Sedation performed by performing physician. Image quality was excellent. The patient's vital signs; including heart rate, blood pressure, and oxygen saturation; remained stable throughout the procedure. The patient developed no complications during the procedure. IMPRESSIONS  1. Left ventricular ejection fraction, by estimation, is 55 to 60%. The left ventricle has normal function. The left ventricle has no regional wall motion abnormalities.  2. Right ventricular systolic function is normal. The right ventricular size is mildly enlarged.  3. No left atrial/left atrial appendage thrombus was detected.  4. Right atrial size was moderately dilated.  5. The mitral valve is grossly normal. Mild mitral valve regurgitation.  6. Tricuspid valve regurgitation is mild to moderate.  7. The aortic valve is tricuspid. Aortic valve regurgitation is trivial. No aortic stenosis is present. Conclusion(s)/Recommendation(s): No evidence of vegetation/infective endocarditis on this transesophageal echocardiogram. FINDINGS  Left Ventricle: Left ventricular ejection fraction, by estimation, is 55 to 60%. The  left ventricle has normal function. The left ventricle has no regional wall motion abnormalities. The left ventricular internal cavity size was normal in size. There is  no left ventricular hypertrophy. Right Ventricle: The right ventricular size is mildly enlarged. No increase in right ventricular wall thickness. Right ventricular systolic function is normal. Left Atrium: Left atrial size was normal in size. No left atrial/left atrial appendage thrombus was detected. Right Atrium: Right atrial size was moderately dilated. Pericardium: There is no evidence of pericardial effusion. Mitral Valve: The mitral valve is grossly normal. Mild mitral valve regurgitation. There is no evidence of mitral valve vegetation. Tricuspid Valve: The tricuspid valve is grossly normal. Tricuspid valve regurgitation is mild to moderate. Aortic Valve: The aortic valve is tricuspid. Aortic valve regurgitation is trivial. No aortic stenosis is present. There is no evidence of aortic valve vegetation. Pulmonic Valve: The pulmonic valve was not well visualized. Pulmonic valve regurgitation is trivial. Aorta: The aortic root is normal in size and structure. IAS/Shunts: The atrial septum is grossly normal. Bartholome Bill MD Electronically signed by Bartholome Bill MD Signature Date/Time: 03/21/2019/10:50:39 AM    Final     Assessment & Plan    Septicemia-  Patient has staph aureus in 1 of 2  blood cultures.  Transthoracic echo showed no evidence of vegetations with good valve function.  Due to septicemia, patient underwent transesophageal echo with no complication.  There were no vegetations noted.  Would continue with antibiotic therapy.  Thrombocytopenia-etiology unclear.    Most recent value was 71,000.  Hemoglobin decreased to 7.2 with hematocrit of 22.5. Patient's CHA2DS2-VASc score is quite high at at least 7 however consideration for holding Pradaxa at least temporarily appears appropriate.  Consider hematologic work-up to determine  source of worsening thrombocytopenia.  Will need discussed resuming Pradaxa at time of discharge.  HFpEF-EF normal by echo .  Also normal by functional study done in January.  There was no evidence of ischemia at that time.  Renal function slightly decreased since admission.  Likely overdiuresis.  Does not report to be in florid pulmonary edema.   Patient does not appear to be in florid pulmonary edema at present.  Agree with holding diuresis and careful rehydration.  Peripheral edema does not appear to be secondary to extreme intravascular volume overload.  Coronary artery disease-status post PCI as mentioned above.  Does not appear ischemic at present.  We will continue to follow.  We will continue aspirin for now however this may need to be addressed as platelet count varies.  Signed, Javier Docker Percy Winterrowd MD 03/21/2019, 12:16 PM  Pager: [phone]

## 2019-03-21 NOTE — Progress Notes (Signed)
[facility] INFECTIOUS DISEASE PROGRESS NOTE Date of Admission:  03/18/2019     ID: Brooke Baker is a 85 y.o. female with Staph aureus bacteremia Active Problems:   AF (paroxysmal atrial fibrillation) (HCC)   Macrocytic anemia   Chronic respiratory failure (HCC)   Sepsis ([place])   Community acquired pneumonia   Subjective: No fevers, TEE negative, wbc 4. FU BCX NGTD from 3/15. Getting transfusion currently.  ROS  Eleven systems are reviewed and negative except per hpi  Medications:  Antibiotics Given (last 72 hours)    Date/Time Action Medication Dose Rate   03/19/19 0050 New Bag/Given   ceFEPIme (MAXIPIME) 2 g in sodium chloride 0.9 % 100 mL IVPB 2 g 200 mL/hr   03/19/19 0053 New Bag/Given   vancomycin (VANCOCIN) IVPB 1000 mg/200 mL premix 1,000 mg 200 mL/hr   03/19/19 0521 New Bag/Given   azithromycin (ZITHROMAX) 500 mg in sodium chloride 0.9 % 250 mL IVPB 500 mg 250 mL/hr   03/19/19 0912 New Bag/Given   cefTRIAXone (ROCEPHIN) 2 g in sodium chloride 0.9 % 100 mL IVPB 2 g 200 mL/hr   03/19/19 1623 New Bag/Given   vancomycin (VANCOREADY) IVPB 1250 mg/250 mL 1,250 mg 166.7 mL/hr   03/20/19 0308 New Bag/Given   azithromycin (ZITHROMAX) 500 mg in sodium chloride 0.9 % 250 mL IVPB 500 mg 250 mL/hr   03/20/19 4401 New Bag/Given   cefTRIAXone (ROCEPHIN) 2 g in sodium chloride 0.9 % 100 mL IVPB 2 g 200 mL/hr   03/20/19 1519 New Bag/Given   vancomycin (VANCOREADY) IVPB 750 mg/150 mL 750 mg 150 mL/hr   03/21/19 1119 Given   azithromycin (ZITHROMAX) tablet 500 mg 500 mg      . aspirin EC  81 mg Oral Daily  . azithromycin  500 mg Oral Daily  . Chlorhexidine Gluconate Cloth  6 each Topical Daily  . fluticasone  2 spray Each Nare Daily  . folic acid  1 mg Oral Daily  . gabapentin  100 mg Oral BID  . insulin aspart  0-9 Units Subcutaneous TID PC & HS  . levETIRAcetam  750 mg Oral BID  . metoprolol succinate  50 mg Oral Daily  . mupirocin ointment   Topical BID  .  nystatin-triamcinolone   Topical BID  . omega-3 acid ethyl esters  1 g Oral Daily  . phenytoin  400 mg Oral QHS  . polyethylene glycol  17 g Oral Daily  . rosuvastatin  20 mg Oral Daily  . sertraline  75 mg Oral Daily  . sodium chloride flush  10-40 mL Intracatheter Q12H    Objective: Vital signs in last 24 hours: Temp:  [98.3 F (36.8 C)-100.1 F (37.8 C)] 99.2 F (37.3 C) (03/16 1502) Pulse Rate:  [90-112] 104 (03/16 1502) Resp:  [13-22] 16 (03/16 1206) BP: (93-129)/(39-68) 127/48 (03/16 1502) SpO2:  [96 %-100 %] 100 % (03/16 1502) Weight:  [108.2 kg] 108.2 kg (03/16 0547) Constitutional:  Sleepy, frail  HENT: [place]/AT, PERRLA, no scleral icterus Mouth/Throat: Oropharynx is clear and moist. No oropharyngeal exudate.  Cardiovascular: Normal rate, regular rhythm and normal heart sounds.  Pulmonary/Chest: Effort normal and breath sounds normal. No respiratory distress.  has no wheezes.  Neck = supple, no nuchal rigidity Abdominal: Soft. Bowel sounds are normal.  exhibits no distension. There is no tenderness.  Lymphadenopathy: no cervical adenopathy. No axillary adenopathy Neurological: alert and oriented to person, place, and time.  Skin: Bil LE with scaly eruptions  Psychiatric: a normal  mood and affect.  behavior is normal.  MSK able to flex R knee and hip without pain    Lab Results Recent Labs    03/20/19 0450 03/21/19 0643  WBC 5.0 4.7  HGB 8.1* 7.2*  HCT 25.3* 22.5*  NA 133* 136  K 4.4 3.7  CL 105 107  CO2 20* 23  BUN 26* 32*  CREATININE 1.24* 1.36*    Microbiology: Results for orders placed or performed during the hospital encounter of 03/18/19  Respiratory Panel by RT PCR (Flu A&B, Covid) - Nasopharyngeal Swab     Status: None   Collection Time: 03/19/19 12:32 AM   Specimen: Nasopharyngeal Swab  Result Value Ref Range Status   SARS Coronavirus 2 by RT PCR NEGATIVE NEGATIVE Final    Comment: (NOTE) SARS-CoV-2 target nucleic acids are NOT DETECTED. The  SARS-CoV-2 RNA is generally detectable in upper respiratoy specimens during the acute phase of infection. The lowest concentration of SARS-CoV-2 viral copies this assay can detect is 131 copies/mL. A negative result does not preclude SARS-Cov-2 infection and should not be used as the sole basis for treatment or other patient management decisions. A negative result may occur with  improper specimen collection/handling, submission of specimen other than nasopharyngeal swab, presence of viral mutation(s) within the areas targeted by this assay, and inadequate number of viral copies (<131 copies/mL). A negative result must be combined with clinical observations, patient history, and epidemiological information. The expected result is Negative. Fact Sheet for Patients:  PinkCheek.be Fact Sheet for Healthcare Providers:  GravelBags.it This test is not yet ap proved or cleared by the [place] FDA and  has been authorized for detection and/or diagnosis of SARS-CoV-2 by FDA under an Emergency Use Authorization (EUA). This EUA will remain  in effect (meaning this test can be used) for the duration of the COVID-19 declaration under Section 564(b)(1) of the Act, 21 U.S.C. section 360bbb-3(b)(1), unless the authorization is terminated or revoked sooner.    Influenza A by PCR NEGATIVE NEGATIVE Final   Influenza B by PCR NEGATIVE NEGATIVE Final    Comment: (NOTE) The Xpert Xpress SARS-CoV-2/FLU/RSV assay is intended as an aid in  the diagnosis of influenza from Nasopharyngeal swab specimens and  should not be used as a sole basis for treatment. Nasal washings and  aspirates are unacceptable for Xpert Xpress SARS-CoV-2/FLU/RSV  testing. Fact Sheet for Patients: PinkCheek.be Fact Sheet for Healthcare Providers: GravelBags.it This test is not yet approved or cleared by the [place] FDA and  has been authorized for detection and/or diagnosis of SARS-CoV-2 by  FDA under an Emergency Use Authorization (EUA). This EUA will remain  in effect (meaning this test can be used) for the duration of the  Covid-19 declaration under Section 564(b)(1) of the Act, 21  U.S.C. section 360bbb-3(b)(1), unless the authorization is  terminated or revoked. Performed at [facility], [address] [place], [place] [address]   Blood Culture (routine x 2)     Status: Abnormal   Collection Time: 03/19/19 12:36 AM   Specimen: BLOOD  Result Value Ref Range Status   Specimen Description   Final    BLOOD LEFT ANTECUBITAL Performed at [facility], [address] [place], [place] [address]    Special Requests   Final    BOTTLES DRAWN AEROBIC AND ANAEROBIC Blood Culture adequate volume Performed at [facility], [address] [place], [place] [address]    Culture  Setup Time   Final  GRAM POSITIVE COCCI IN BOTH AEROBIC AND ANAEROBIC BOTTLES CRITICAL RESULT CALLED TO, READ BACK BY AND VERIFIED WITH: ALEX CHAPPELL 03/19/19 @ [address]    Culture METHICILLIN RESISTANT STAPHYLOCOCCUS AUREUS (A)  Final   Report Status 03/21/2019 FINAL  Final   Organism ID, Bacteria METHICILLIN RESISTANT STAPHYLOCOCCUS AUREUS  Final      Susceptibility   Methicillin resistant staphylococcus aureus - MIC*    CIPROFLOXACIN >=8 RESISTANT Resistant     ERYTHROMYCIN >=8 RESISTANT Resistant     GENTAMICIN <=0.5 SENSITIVE Sensitive     OXACILLIN >=4 RESISTANT Resistant     TETRACYCLINE <=1 SENSITIVE Sensitive     VANCOMYCIN <=0.5 SENSITIVE Sensitive     TRIMETH/SULFA <=10 SENSITIVE Sensitive     CLINDAMYCIN <=0.25 SENSITIVE Sensitive     RIFAMPIN <=0.5 SENSITIVE Sensitive     Inducible Clindamycin NEGATIVE Sensitive     * METHICILLIN RESISTANT STAPHYLOCOCCUS AUREUS  MRSA PCR Screening     Status: Abnormal   Collection Time: 03/19/19 12:25 PM   Specimen: Urine, Clean  Catch; Nasopharyngeal  Result Value Ref Range Status   MRSA by PCR POSITIVE (A) NEGATIVE Final    Comment:        The GeneXpert MRSA Assay (FDA approved for NASAL specimens only), is one component of a comprehensive MRSA colonization surveillance program. It is not intended to diagnose MRSA infection nor to guide or monitor treatment for MRSA infections. RESULT CALLED TO, READ BACK BY AND VERIFIED WITH: DENISE [facility] AT 7425 03/19/19.PMF Performed at [facility], [address] [place], [place] [address]   CULTURE, BLOOD (ROUTINE X 2) w Reflex to ID Panel     Status: None (Preliminary result)   Collection Time: 03/20/19  5:40 PM   Specimen: BLOOD  Result Value Ref Range Status   Specimen Description BLOOD LEFT ANTECUBITAL  Final   Special Requests   Final    BOTTLES DRAWN AEROBIC AND ANAEROBIC Blood Culture adequate volume   Culture   Final    NO GROWTH < 24 HOURS Performed at [facility], [address], [place], [place] [address]    Report Status PENDING  Incomplete  CULTURE, BLOOD (ROUTINE X 2) w Reflex to ID Panel     Status: None (Preliminary result)   Collection Time: 03/20/19  5:40 PM   Specimen: BLOOD  Result Value Ref Range Status   Specimen Description BLOOD BLOOD LEFT HAND  Final   Special Requests   Final    BOTTLES DRAWN AEROBIC AND ANAEROBIC Blood Culture adequate volume   Culture   Final    NO GROWTH < 24 HOURS Performed at [facility], [address] [place], [place] [address]    Report Status PENDING  Incomplete    Studies/Results: CT HEAD WO CONTRAST  Result Date: 03/19/2019 CLINICAL DATA:  85 year old female with fall. EXAM: CT HEAD WITHOUT CONTRAST TECHNIQUE: Contiguous axial images were obtained from the base of the skull through the vertex without intravenous contrast. COMPARISON:  Head CT dated 09/08/2018. FINDINGS: Brain: There is moderate age-related atrophy and chronic microvascular ischemic changes. There is no  acute intracranial hemorrhage. No mass effect or midline shift. No extra-axial fluid collection. Vascular: No hyperdense vessel or unexpected calcification. Skull: Normal. Negative for fracture or focal lesion. Sinuses/Orbits: There is opacification of the left frontal sinuses and maxillary sinuses with remodeling of the medial wall of the right maxillary sinus. Opacification of the left sphenoid sinuses. No air-fluid level. The mastoid air cells are  clear. Other: None IMPRESSION: 1. No acute intracranial pathology. 2. Age-related atrophy and chronic microvascular ischemic changes. 3. Chronic paranasal sinus disease. Electronically Signed   By: Anner Crete M.D.   On: 03/19/2019 22:31   ECHOCARDIOGRAM COMPLETE  Result Date: 03/20/2019    ECHOCARDIOGRAM REPORT   Patient Name:   Brooke Baker Date of Exam: 03/20/2019 Medical Rec #:  [id number]        Height:       66.0 in Accession #:    [id number]       Weight:       162.0 lb Date of Birth:  1934-09-30        BSA:          1.828 m Patient Age:    26 years         BP:           57/93 mmHg Patient Gender: F                HR:           114 bpm. Exam Location:  [facility] Procedure: 2D Echo, Cardiac Doppler and Color Doppler Indications:     Dyspnea 786.09  History:         Patient has no prior history of Echocardiogram examinations.                  CAD, COPD; Risk Factors:Hypertension. CVA.  Sonographer:     Sherrie Sport RDCS (AE) Referring Phys:  [id number] [facility] AMIN Diagnosing Phys: Bartholome Bill MD IMPRESSIONS  1. Left ventricular ejection fraction, by estimation, is 70 to 75%. Left ventricular ejection fraction by PLAX is 75 %. The left ventricle has hyperdynamic function. The left ventricle has no regional wall motion abnormalities. Left ventricular diastolic parameters are consistent with Grade I diastolic dysfunction (impaired relaxation).  2. Right ventricular systolic function is normal. The right ventricular size is mildly enlarged. There is moderately  elevated pulmonary artery systolic pressure.  3. Left atrial size was mildly dilated.  4. Right atrial size was mildly dilated.  5. The mitral valve is grossly normal. Mild mitral valve regurgitation.  6. The aortic valve is grossly normal. Aortic valve regurgitation is trivial. FINDINGS  Left Ventricle: Left ventricular ejection fraction, by estimation, is 70 to 75%. Left ventricular ejection fraction by PLAX is 75 % The left ventricle has hyperdynamic function. The left ventricle has no regional wall motion abnormalities. The left ventricular internal cavity size was normal in size. There is borderline left ventricular hypertrophy. Left ventricular diastolic parameters are consistent with Grade I diastolic dysfunction (impaired relaxation). Right Ventricle: The right ventricular size is mildly enlarged. No increase in right ventricular wall thickness. Right ventricular systolic function is normal. There is moderately elevated pulmonary artery systolic pressure. The tricuspid regurgitant velocity is 3.27 m/s, and with an assumed right atrial pressure of 10 mmHg, the estimated right ventricular systolic pressure is 53.9 mmHg. Left Atrium: Left atrial size was mildly dilated. Right Atrium: Right atrial size was mildly dilated. Pericardium: There is no evidence of pericardial effusion. Mitral Valve: The mitral valve is grossly normal. Mild mitral valve regurgitation. Tricuspid Valve: The tricuspid valve is grossly normal. Tricuspid valve regurgitation is mild. Aortic Valve: The aortic valve is grossly normal. Aortic valve regurgitation is trivial. Aortic valve mean gradient measures 3.5 mmHg. Aortic valve peak gradient measures 7.2 mmHg. Aortic valve area, by VTI measures 2.48 cm. Pulmonic Valve: The pulmonic valve was grossly normal. Pulmonic valve regurgitation  is mild. Aorta: The aortic root was not well visualized. IAS/Shunts: The atrial septum is grossly normal.  LEFT VENTRICLE PLAX 2D LV EF:         Left  ventricular ejection fraction by PLAX is 75 % LVIDd:         3.69 cm LVIDs:         2.09 cm LV PW:         1.35 cm LV IVS:        0.78 cm LVOT diam:     2.00 cm LV SV:         58 LV SV Index:   32 LVOT Area:     3.14 cm  RIGHT VENTRICLE RV Basal diam:  4.34 cm LEFT ATRIUM            Index       RIGHT ATRIUM           Index LA diam:      4.70 cm  2.57 cm/m  RA Area:     24.40 cm LA Vol (A2C): 158.0 ml 86.41 ml/m RA Volume:   87.20 ml  47.69 ml/m LA Vol (A4C): 52.8 ml  28.88 ml/m  AORTIC VALVE                   PULMONIC VALVE AV Area (Vmax):    2.20 cm    PV Vmax:        0.71 m/s AV Area (Vmean):   2.43 cm    PV Peak grad:   2.0 mmHg AV Area (VTI):     2.48 cm    RVOT Peak grad: 2 mmHg AV Vmax:           134.00 cm/s AV Vmean:          87.550 cm/s AV VTI:            0.234 m AV Peak Grad:      7.2 mmHg AV Mean Grad:      3.5 mmHg LVOT Vmax:         93.80 cm/s LVOT Vmean:        67.600 cm/s LVOT VTI:          0.185 m LVOT/AV VTI ratio: 0.79  AORTA Ao Root diam: 3.10 cm MITRAL VALVE                TRICUSPID VALVE MV Area (PHT): 4.89 cm     TR Peak grad:   42.8 mmHg MV Decel Time: 155 msec     TR Vmax:        327.00 cm/s MV E velocity: 127.00 cm/s MV A velocity: 128.00 cm/s  SHUNTS MV E/A ratio:  0.99         Systemic VTI:  0.18 m                             Systemic Diam: 2.00 cm Bartholome Bill MD Electronically signed by Bartholome Bill MD Signature Date/Time: 03/20/2019/4:00:44 PM    Final    ECHO TEE  Result Date: 03/21/2019    TRANSESOPHOGEAL ECHO REPORT   Patient Name:   Brooke Baker Date of Exam: 03/21/2019 Medical Rec #:  [id number]        Height:       66.0 in Accession #:    [id number]       Weight:       238.5 lb Date  of Birth:  [birth year]        BSA:          2.155 m Patient Age:    57 years         BP:           107/40 mmHg Patient Gender: F                HR:           111 bpm. 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 COPD; Risk Factors:Hypertension and Diabetes.                  CVA.  Sonographer:     Sherrie Sport RDCS (AE) Referring Phys:  [place] Diagnosing Phys: Bartholome Bill MD PROCEDURE: TEE procedure time was 16 minutes. The transesophogeal probe was passed without difficulty through the esophogus of the patient. Imaged were obtained with the patient in a left lateral decubitus position. Local oropharyngeal anesthetic was provided with viscous lidocaine and Benzocaine spray. Sedation performed by performing physician. Image quality was excellent. The patient's vital signs; including heart rate, blood pressure, and oxygen saturation; remained stable throughout the procedure. The patient developed no complications during the procedure. IMPRESSIONS  1. Left ventricular ejection fraction, by estimation, is 55 to 60%. The left ventricle has normal function. The left ventricle has no regional wall motion abnormalities.  2. Right ventricular systolic function is normal. The right ventricular size is mildly enlarged.  3. No left atrial/left atrial appendage thrombus was detected.  4. Right atrial size was moderately dilated.  5. The mitral valve is grossly normal. Mild mitral valve regurgitation.  6. Tricuspid valve regurgitation is mild to moderate.  7. The aortic valve is tricuspid. Aortic valve regurgitation is trivial. No aortic stenosis is present. Conclusion(s)/Recommendation(s): No evidence of vegetation/infective endocarditis on this transesophageal echocardiogram. FINDINGS  Left Ventricle: Left ventricular ejection fraction, by estimation, is 55 to 60%. The left ventricle has normal function. The left ventricle has no regional wall motion abnormalities. The left ventricular internal cavity size was normal in size. There is  no left ventricular hypertrophy. Right  Ventricle: The right ventricular size is mildly enlarged. No increase in right ventricular wall thickness. Right ventricular systolic function is normal. Left Atrium: Left atrial size was normal in size. No left atrial/left atrial appendage thrombus was detected. Right Atrium: Right atrial size was moderately dilated. Pericardium: There is no evidence of pericardial effusion. Mitral Valve: The mitral valve is grossly normal. Mild mitral valve regurgitation. There is no evidence of mitral valve vegetation. Tricuspid Valve: The tricuspid valve is grossly normal. Tricuspid valve regurgitation is mild to moderate. Aortic Valve: The aortic valve is tricuspid. Aortic valve regurgitation is trivial. No aortic stenosis is present. There is no evidence of aortic valve vegetation. Pulmonic Valve: The pulmonic valve was not well visualized. Pulmonic valve regurgitation is trivial. Aorta: The aortic root is normal in size and structure. IAS/Shunts: The atrial septum is grossly normal. Bartholome Bill MD Electronically signed by Bartholome Bill MD Signature Date/Time: 03/21/2019/10:50:39 AM    Final     Assessment/Plan: MAICIE VANDERLOOP is a 85 y.o. female with multiple medical issues  admitted with fevers, sob and found to have staph aureus bacteremia. 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  Has back pain but acute on chronic 3/16 - TEE neg ESR 75, crp 21 Recommendations Can place picc once fu BCX neg x 48 hours Cont vanco With neg TEE but presence of prosthetic joints and high risk of seeding these will plan on  4 weeks IV vanco  Would try to get MRI or CT spine  if hardware precludes MRI prior to dc to eval for discitis or osteomyeltis Thank you very much for the consult. Will follow with you.  Leonel Ramsay   03/21/2019, 3:27 PM

## 2019-03-21 NOTE — Progress Notes (Signed)
Hematology/Oncology Consult note [facility]  Telephone:([phone] [phone]  Patient Care Team: Einar Pheasant, MD as PCP - General   Name of the patient: Brooke Baker  [id number]  01/20/1934   Date of visit: 03/21/19   Interval history- more alert today. T max 100.1 in the last 24 hours. Reports fatigue  Review of systems- Review of Systems  Constitutional: Positive for malaise/fatigue. Negative for chills, fever and weight loss.  HENT: Negative for congestion, ear discharge and nosebleeds.   Eyes: Negative for blurred vision.  Respiratory: Negative for cough, hemoptysis, sputum production, shortness of breath and wheezing.   Cardiovascular: Negative for chest pain, palpitations, orthopnea and claudication.  Gastrointestinal: Negative for abdominal pain, blood in stool, constipation, diarrhea, heartburn, melena, nausea and vomiting.  Genitourinary: Negative for dysuria, flank pain, frequency, hematuria and urgency.  Musculoskeletal: Negative for back pain, joint pain and myalgias.  Skin: Negative for rash.  Neurological: Negative for dizziness, tingling, focal weakness, seizures, weakness and headaches.  Endo/Heme/Allergies: Does not bruise/bleed easily.  Psychiatric/Behavioral: Negative for depression and suicidal ideas. The patient does not have insomnia.       Allergies  Allergen Reactions  . Doxycycline Diarrhea and Nausea Only  . Methotrexate Rash     Past Medical History:  Diagnosis Date  . Acute bronchitis   . Allergic rhinitis   . Arthritis   . CAD (coronary artery disease)    s/p stent LAD 8/03 and stent placement OM1  . Cancer ([place])    skin  . Chronic back pain   . COPD with asthma ([place])   . CVA (cerebral vascular accident) ([place])   . Diabetes mellitus ([place])   . Hypercholesterolemia   . Hypertension   . Seizure disorder [facility])      Past Surgical History:  Procedure Laterality Date  . BREAST BIOPSY Bilateral     axillas  . CORONARY STENT PLACEMENT  8/03   LAD  . CORONARY STENT PLACEMENT     OM1  2006  . FEMUR FRACTURE SURGERY Right [id number]   Dr. Marry Guan  . LUMBAR SPINE SURGERY     x3  . TOTAL ABDOMINAL HYSTERECTOMY     appendectomy - age 28  . TOTAL HIP ARTHROPLASTY    . TOTAL KNEE ARTHROPLASTY     2005    Social History   Socioeconomic History  . Marital status: Married    Spouse name: Not on file  . Number of children: 3  . Years of education: hs  . Highest education level: Not on file  Occupational History  . Occupation: retired Psychologist, sport and exercise: RETIRED  Tobacco Use  . Smoking status: Never Smoker  . Smokeless tobacco: Never Used  Substance and Sexual Activity  . Alcohol use: No    Alcohol/week: 0.0 standard drinks  . Drug use: No  . Sexual activity: Never  Other Topics Concern  . Not on file  Social History Narrative   lives with husband- Edsel   Social Determinants of Health   Financial Resource Strain:   . Difficulty of Paying Living Expenses:   Food Insecurity:   . Worried About Charity fundraiser in the Last Year:   . Arboriculturist in the Last Year:   Transportation Needs:   . Film/video editor (Medical):   [name] Lack of Transportation (Non-Medical):   Physical Activity:   . Days of Exercise per Week:   . Minutes of Exercise per  Session:   Stress:   . Feeling of Stress :   Social Connections:   . Frequency of Communication with Friends and Family:   . Frequency of Social Gatherings with Friends and Family:   . Attends Religious Services:   . Active Member of Clubs or Organizations:   . Attends Archivist Meetings:   [name] Marital Status:   Intimate Partner Violence:   . Fear of Current or Ex-Partner:   . Emotionally Abused:   [name] Physically Abused:   . Sexually Abused:     Family History  Problem Relation Age of Onset  . Allergies Brother   . Cancer Brother   . Allergies Sister   . Cancer Sister        Non-hodgkins lymphoma    . Asthma Sister   . Rheum arthritis Sister   . Stroke Mother   . Asthma Brother   . Rheum arthritis Brother   . Cancer Sister        non-hodgkins lymphoma  . Diabetes Brother      Current Facility-Administered Medications:  .  acetaminophen (TYLENOL) tablet 650 mg, 650 mg, Oral, Q6H PRN **OR** acetaminophen (TYLENOL) suppository 650 mg, 650 mg, Rectal, Q6H PRN, Mansy, Jan A, MD .  aspirin EC tablet 81 mg, 81 mg, Oral, Daily, Fath, Javier Docker, MD, 81 mg at 03/21/19 1119 .  azithromycin (ZITHROMAX) tablet 500 mg, 500 mg, Oral, Daily, Oswald Hillock, RPH, 500 mg at 03/21/19 1119 .  Chlorhexidine Gluconate Cloth 2 % PADS 6 each, 6 each, Topical, Daily, Lorella Nimrod, MD, 6 each at 03/21/19 0700 .  fluticasone (FLONASE) 50 MCG/ACT nasal spray 2 spray, 2 spray, Each Nare, Daily, Mansy, Jan A, MD .  folic acid (FOLVITE) tablet 1 mg, 1 mg, Oral, Daily, Sindy Guadeloupe, MD, 1 mg at 03/21/19 1120 .  gabapentin (NEURONTIN) capsule 100 mg, 100 mg, Oral, BID, Mansy, Jan A, MD, 100 mg at 03/21/19 1119 .  insulin aspart (novoLOG) injection 0-9 Units, 0-9 Units, Subcutaneous, TID PC & HS, Mansy, Jan A, MD .  levETIRAcetam (KEPPRA) tablet 750 mg, 750 mg, Oral, BID, Mansy, Jan A, MD, 750 mg at 03/21/19 1119 .  magnesium hydroxide (MILK OF MAGNESIA) suspension 30 mL, 30 mL, Oral, Daily PRN, Mansy, Jan A, MD .  metoprolol succinate (TOPROL-XL) 24 hr tablet 50 mg, 50 mg, Oral, Daily, Mansy, Jan A, MD, 50 mg at 03/19/19 0853 .  mupirocin ointment (BACTROBAN) 2 %, , Topical, BID, Mansy, Arvella Merles, MD, Given at 03/21/19 1120 .  nystatin-triamcinolone (MYCOLOG II) cream, , Topical, BID, Mansy, Arvella Merles, MD, Given at 03/21/19 0941 .  omega-3 acid ethyl esters (LOVAZA) capsule 1 g, 1 g, Oral, Daily, Mansy, Jan A, MD, 1 g at 03/21/19 1119 .  ondansetron (ZOFRAN) tablet 4 mg, 4 mg, Oral, Q6H PRN **OR** ondansetron (ZOFRAN) injection 4 mg, 4 mg, Intravenous, Q6H PRN, Mansy, Jan A, MD .  phenytoin (DILANTIN) ER capsule 400  mg, 400 mg, Oral, QHS, Mansy, Jan A, MD, 400 mg at 03/20/19 2139 .  polyethylene glycol (MIRALAX / GLYCOLAX) packet 17 g, 17 g, Oral, Daily, Mansy, Jan A, MD, 17 g at 03/20/19 0918 .  rosuvastatin (CRESTOR) tablet 20 mg, 20 mg, Oral, Daily, Mansy, Jan A, MD, 20 mg at 03/20/19 1633 .  sertraline (ZOLOFT) tablet 75 mg, 75 mg, Oral, Daily, Mansy, Jan A, MD, 75 mg at 03/21/19 1119 .  sodium chloride flush (NS) 0.9 % injection 10-40 mL, 10-40 mL,  Intracatheter, Q12H, Lorella Nimrod, MD, 10 mL at 03/20/19 2139 .  traZODone (DESYREL) tablet 25 mg, 25 mg, Oral, QHS PRN, Mansy, Jan A, MD, 25 mg at 03/19/19 2304 .  vancomycin (VANCOREADY) IVPB 750 mg/150 mL, 750 mg, Intravenous, Q24H, Benita Gutter, [facility], Last Rate: 150 mL/hr at 03/20/19 1519, 750 mg at 03/20/19 1519  Physical exam:  Vitals:   03/21/19 0941 03/21/19 1206 03/21/19 1425 03/21/19 1502  BP: (!) 106/46 (!) 129/47 (!) 113/58 (!) 127/48  Pulse: 95 96 (!) 105 (!) 104  Resp: 16 16    Temp: 98.7 F (37.1 C) 98.3 F (36.8 C) 98.6 F (37 C) 99.2 F (37.3 C)  TempSrc: Oral  Oral Oral  SpO2: 97%  100% 100%  Weight:      Height:       Physical Exam Constitutional:      Comments: Appears fatigued  HENT:     Head: Normocephalic and atraumatic.  Eyes:     Pupils: Pupils are equal, round, and reactive to light.  Cardiovascular:     Rate and Rhythm: Normal rate and regular rhythm.     Heart sounds: Normal heart sounds.  Pulmonary:     Effort: Pulmonary effort is normal.     Breath sounds: Normal breath sounds.  Abdominal:     General: Bowel sounds are normal.     Palpations: Abdomen is soft.  Musculoskeletal:     Cervical back: Normal range of motion.     Right lower leg: Edema present.     Left lower leg: Edema present.     Comments: Left arm appears swollen  Skin:    General: Skin is warm and dry.  Neurological:     Mental Status: She is alert.     Comments: Oriented to self and place      CMP Latest Ref Rng & Units  03/21/2019  Glucose 70 - 99 mg/dL 95  BUN 8 - 23 mg/dL 32(H)  Creatinine 0.44 - 1.00 mg/dL 1.36(H)  Sodium 135 - 145 mmol/L 136  Potassium 3.5 - 5.1 mmol/L 3.7  Chloride 98 - 111 mmol/L 107  CO2 22 - 32 mmol/L 23  Calcium 8.9 - 10.3 mg/dL 7.5(L)  Total Protein 6.5 - 8.1 g/dL -  Total Bilirubin 0.3 - 1.2 mg/dL -  Alkaline Phos 38 - 126 U/L -  AST 15 - 41 U/L -  ALT 0 - 44 U/L -   CBC Latest Ref Rng & Units 03/21/2019  WBC 4.0 - 10.5 K/uL 4.7  Hemoglobin 12.0 - 15.0 g/dL 7.2(L)  Hematocrit 36.0 - 46.0 % 22.5(L)  Platelets 150 - 400 K/uL 71(L)    @IMAGES @  CT HEAD WO CONTRAST  Result Date: 03/19/2019 CLINICAL DATA:  85 year old female with fall. EXAM: CT HEAD WITHOUT CONTRAST TECHNIQUE: Contiguous axial images were obtained from the base of the skull through the vertex without intravenous contrast. COMPARISON:  Head CT dated 09/08/2018. FINDINGS: Brain: There is moderate age-related atrophy and chronic microvascular ischemic changes. There is no acute intracranial hemorrhage. No mass effect or midline shift. No extra-axial fluid collection. Vascular: No hyperdense vessel or unexpected calcification. Skull: Normal. Negative for fracture or focal lesion. Sinuses/Orbits: There is opacification of the left frontal sinuses and maxillary sinuses with remodeling of the medial wall of the right maxillary sinus. Opacification of the left sphenoid sinuses. No air-fluid level. The mastoid air cells are clear. Other: None IMPRESSION: 1. No acute intracranial pathology. 2. Age-related atrophy and chronic microvascular ischemic  changes. 3. Chronic paranasal sinus disease. Electronically Signed   By: Anner Crete M.D.   On: 03/19/2019 22:31   DG Chest Port 1 View  Result Date: 03/18/2019 CLINICAL DATA:  85 year old female with sepsis. EXAM: PORTABLE CHEST 1 VIEW COMPARISON:  Chest radiograph dated 04/20/2017. FINDINGS: There is chronic interstitial coarsening and bronchitic changes. Overall slight  interval progression of interstitial densities since the prior radiograph which may represent progression of background of lung disease although superimposed pneumonia is not excluded. Clinical correlation is recommended. No focal consolidation or pneumothorax. Trace bilateral pleural effusions may be present. There is mild cardiomegaly. Atherosclerotic calcification of the aorta. No acute osseous pathology. IMPRESSION: Slight interval progression of interstitial densities may represent progression of lung disease versus superimposed pneumonia. Electronically Signed   By: Anner Crete M.D.   On: 03/18/2019 23:43   ECHOCARDIOGRAM COMPLETE  Result Date: 03/20/2019    ECHOCARDIOGRAM REPORT   Patient Name:   Brooke Baker Date of Exam: 03/20/2019 Medical Rec #:  [id number]        Height:       66.0 in Accession #:    [id number]       Weight:       162.0 lb Date of Birth:  08-May-1934        BSA:          1.828 m Patient Age:    13 years         BP:           57/93 mmHg Patient Gender: F                HR:           114 bpm. Exam Location:  [facility] Procedure: 2D Echo, Cardiac Doppler and Color Doppler Indications:     Dyspnea 786.09  History:         Patient has no prior history of Echocardiogram examinations.                  CAD, COPD; Risk Factors:Hypertension. CVA.  Sonographer:     Sherrie Sport RDCS (AE) Referring Phys:  [id number] [facility] AMIN Diagnosing Phys: Bartholome Bill MD IMPRESSIONS  1. Left ventricular ejection fraction, by estimation, is 70 to 75%. Left ventricular ejection fraction by PLAX is 75 %. The left ventricle has hyperdynamic function. The left ventricle has no regional wall motion abnormalities. Left ventricular diastolic parameters are consistent with Grade I diastolic dysfunction (impaired relaxation).  2. Right ventricular systolic function is normal. The right ventricular size is mildly enlarged. There is moderately elevated pulmonary artery systolic pressure.  3. Left atrial size was mildly  dilated.  4. Right atrial size was mildly dilated.  5. The mitral valve is grossly normal. Mild mitral valve regurgitation.  6. The aortic valve is grossly normal. Aortic valve regurgitation is trivial. FINDINGS  Left Ventricle: Left ventricular ejection fraction, by estimation, is 70 to 75%. Left ventricular ejection fraction by PLAX is 75 % The left ventricle has hyperdynamic function. The left ventricle has no regional wall motion abnormalities. The left ventricular internal cavity size was normal in size. There is borderline left ventricular hypertrophy. Left ventricular diastolic parameters are consistent with Grade I diastolic dysfunction (impaired relaxation). Right Ventricle: The right ventricular size is mildly enlarged. No increase in right ventricular wall thickness. Right ventricular systolic function is normal. There is moderately elevated pulmonary artery systolic pressure. The tricuspid regurgitant velocity is 3.27 m/s, and with an assumed right atrial  pressure of 10 mmHg, the estimated right ventricular systolic pressure is 32.6 mmHg. Left Atrium: Left atrial size was mildly dilated. Right Atrium: Right atrial size was mildly dilated. Pericardium: There is no evidence of pericardial effusion. Mitral Valve: The mitral valve is grossly normal. Mild mitral valve regurgitation. Tricuspid Valve: The tricuspid valve is grossly normal. Tricuspid valve regurgitation is mild. Aortic Valve: The aortic valve is grossly normal. Aortic valve regurgitation is trivial. Aortic valve mean gradient measures 3.5 mmHg. Aortic valve peak gradient measures 7.2 mmHg. Aortic valve area, by VTI measures 2.48 cm. Pulmonic Valve: The pulmonic valve was grossly normal. Pulmonic valve regurgitation is mild. Aorta: The aortic root was not well visualized. IAS/Shunts: The atrial septum is grossly normal.  LEFT VENTRICLE PLAX 2D LV EF:         Left ventricular ejection fraction by PLAX is 75 % LVIDd:         3.69 cm LVIDs:          2.09 cm LV PW:         1.35 cm LV IVS:        0.78 cm LVOT diam:     2.00 cm LV SV:         58 LV SV Index:   32 LVOT Area:     3.14 cm  RIGHT VENTRICLE RV Basal diam:  4.34 cm LEFT ATRIUM            Index       RIGHT ATRIUM           Index LA diam:      4.70 cm  2.57 cm/m  RA Area:     24.40 cm LA Vol (A2C): 158.0 ml 86.41 ml/m RA Volume:   87.20 ml  47.69 ml/m LA Vol (A4C): 52.8 ml  28.88 ml/m  AORTIC VALVE                   PULMONIC VALVE AV Area (Vmax):    2.20 cm    PV Vmax:        0.71 m/s AV Area (Vmean):   2.43 cm    PV Peak grad:   2.0 mmHg AV Area (VTI):     2.48 cm    RVOT Peak grad: 2 mmHg AV Vmax:           134.00 cm/s AV Vmean:          87.550 cm/s AV VTI:            0.234 m AV Peak Grad:      7.2 mmHg AV Mean Grad:      3.5 mmHg LVOT Vmax:         93.80 cm/s LVOT Vmean:        67.600 cm/s LVOT VTI:          0.185 m LVOT/AV VTI ratio: 0.79  AORTA Ao Root diam: 3.10 cm MITRAL VALVE                TRICUSPID VALVE MV Area (PHT): 4.89 cm     TR Peak grad:   42.8 mmHg MV Decel Time: 155 msec     TR Vmax:        327.00 cm/s MV E velocity: 127.00 cm/s MV A velocity: 128.00 cm/s  SHUNTS MV E/A ratio:  0.99         Systemic VTI:  0.18 m  Systemic Diam: 2.00 cm Bartholome Bill MD Electronically signed by Bartholome Bill MD Signature Date/Time: 03/20/2019/4:00:44 PM    Final    ECHO TEE  Result Date: 03/21/2019    TRANSESOPHOGEAL ECHO REPORT   Patient Name:   Brooke Baker Date of Exam: 03/21/2019 Medical Rec #:  [id number]        Height:       66.0 in Accession #:    [id number]       Weight:       238.5 lb Date of Birth:  [birth year]        BSA:          2.155 m Patient Age:    64 years         BP:           107/40 mmHg Patient Gender: F                HR:           111 bpm. Exam Location:  [facility] Procedure: Transesophageal Echo, Color Doppler and Cardiac Doppler Indications:     SBE subacute bacterial endocarditis 421.0  History:         Patient has prior history of  Echocardiogram examinations, most                  recent 03/20/2019. COPD; Risk Factors:Hypertension and Diabetes.                  CVA.  Sonographer:     Sherrie Sport RDCS (AE) Referring Phys:  [place] Diagnosing Phys: Bartholome Bill MD PROCEDURE: TEE procedure time was 16 minutes. The transesophogeal probe was passed without difficulty through the esophogus of the patient. Imaged were obtained with the patient in a left lateral decubitus position. Local oropharyngeal anesthetic was provided with viscous lidocaine and Benzocaine spray. Sedation performed by performing physician. Image quality was excellent. The patient's vital signs; including heart rate, blood pressure, and oxygen saturation; remained stable throughout the procedure. The patient developed no complications during the procedure. IMPRESSIONS  1. Left ventricular ejection fraction, by estimation, is 55 to 60%. The left ventricle has normal function. The left ventricle has no regional wall motion abnormalities.  2. Right ventricular systolic function is normal. The right ventricular size is mildly enlarged.  3. No left atrial/left atrial appendage thrombus was detected.  4. Right atrial size was moderately dilated.  5. The mitral valve is grossly normal. Mild mitral valve regurgitation.  6. Tricuspid valve regurgitation is mild to moderate.  7. The aortic valve is tricuspid. Aortic valve regurgitation is trivial. No aortic stenosis is present. Conclusion(s)/Recommendation(s): No evidence of vegetation/infective endocarditis on this transesophageal echocardiogram. FINDINGS  Left Ventricle: Left ventricular ejection fraction, by estimation, is 55 to 60%. The left ventricle has normal function. The left ventricle has no regional wall motion abnormalities. The left ventricular internal cavity size was normal in size. There is  no left ventricular hypertrophy. Right Ventricle: The right ventricular size is mildly enlarged. No increase in right  ventricular wall thickness. Right ventricular systolic function is normal. Left Atrium: Left atrial size was normal in size. No left atrial/left atrial appendage thrombus was detected. Right Atrium: Right atrial size was moderately dilated. Pericardium: There is no evidence of pericardial effusion. Mitral Valve: The mitral valve is grossly normal. Mild mitral valve regurgitation. There is no evidence of mitral valve vegetation. Tricuspid Valve: The tricuspid valve is grossly normal. Tricuspid valve regurgitation is mild  to moderate. Aortic Valve: The aortic valve is tricuspid. Aortic valve regurgitation is trivial. No aortic stenosis is present. There is no evidence of aortic valve vegetation. Pulmonic Valve: The pulmonic valve was not well visualized. Pulmonic valve regurgitation is trivial. Aorta: The aortic root is normal in size and structure. IAS/Shunts: The atrial septum is grossly normal. Bartholome Bill MD Electronically signed by Bartholome Bill MD Signature Date/Time: 03/21/2019/10:50:39 AM    Final      Assessment and plan- Patient is a 85 y.o. 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 Pancytopenia- acutely worse as compared to her baseline. Suspect this is due to bone marrow suppression from ongoing MRSA bacteremia.  Thrombocytopenia- DIC panel negative. Etiology as above. Ok to continue anticoagulation if counts >50  Anemia- also likely acutely worse from baseline of 10 due to sepsis. Ok to transfuse 1unit of prbc today  If counts dont improve over next few weeks after resolution of sepsis, will consider bone marrow biopsy  2. MRSA bacteremia- on vancomycin   Visit Diagnosis 1. Sepsis, due to unspecified organism, unspecified whether acute organ dysfunction present ([place])   2. Community acquired pneumonia, unspecified laterality   3. Chronic respiratory failure, unspecified whether with hypoxia or hypercapnia ([place])   4.  Thrombocytopenia ([place])      Dr. Randa Evens, MD, MPH [facility] at [facility] [phone] 03/21/2019 3:25 PM

## 2019-03-21 NOTE — Progress Notes (Signed)
*  PRELIMINARY RESULTS* Echocardiogram Echocardiogram Transesophageal has been performed.  Brooke Baker 03/21/2019, 8:46 AM

## 2019-03-21 NOTE — Progress Notes (Signed)
OT Cancellation Note  Patient Details Name: ADISSON LUEBBERING MRN: [id number] DOB: 1934-11-12   Cancelled Treatment:    Reason Eval/Treat Not Completed: Patient at procedure or test/ unavailable;Other (comment);Medical issues which prohibited therapy   Patient currently receiving blood transfusion at second attempt this date.  Recommended to hold evaluation at this time.  Will follow up as appropriate and able.  Thank you.  Oren Binet 03/21/2019, 4:15 PM

## 2019-03-22 ENCOUNTER — Inpatient Hospital Stay: Payer: Medicare PPO

## 2019-03-22 ENCOUNTER — Inpatient Hospital Stay: Payer: Self-pay

## 2019-03-22 LAB — CBC WITH DIFFERENTIAL/PLATELET
Abs Immature Granulocytes: 0.02 10*3/uL (ref 0.00–0.07)
Basophils Absolute: 0 10*3/uL (ref 0.0–0.1)
Basophils Relative: 0 %
Eosinophils Absolute: 0.4 10*3/uL (ref 0.0–0.5)
Eosinophils Relative: 9 %
HCT: 21.7 % — ABNORMAL LOW (ref 36.0–46.0)
Hemoglobin: 7 g/dL — ABNORMAL LOW (ref 12.0–15.0)
Immature Granulocytes: 1 %
Lymphocytes Relative: 14 %
Lymphs Abs: 0.5 10*3/uL — ABNORMAL LOW (ref 0.7–4.0)
MCH: 35 pg — ABNORMAL HIGH (ref 26.0–34.0)
MCHC: 32.3 g/dL (ref 30.0–36.0)
MCV: 108.5 fL — ABNORMAL HIGH (ref 80.0–100.0)
Monocytes Absolute: 0.5 10*3/uL (ref 0.1–1.0)
Monocytes Relative: 14 %
Neutro Abs: 2.3 10*3/uL (ref 1.7–7.7)
Neutrophils Relative %: 62 %
Platelets: 71 10*3/uL — ABNORMAL LOW (ref 150–400)
RBC: 2 MIL/uL — ABNORMAL LOW (ref 3.87–5.11)
RDW: 15.6 % — ABNORMAL HIGH (ref 11.5–15.5)
WBC: 3.7 10*3/uL — ABNORMAL LOW (ref 4.0–10.5)
nRBC: 0 % (ref 0.0–0.2)

## 2019-03-22 LAB — BASIC METABOLIC PANEL
Anion gap: 7 (ref 5–15)
BUN: 29 mg/dL — ABNORMAL HIGH (ref 8–23)
CO2: 20 mmol/L — ABNORMAL LOW (ref 22–32)
Calcium: 7.5 mg/dL — ABNORMAL LOW (ref 8.9–10.3)
Chloride: 109 mmol/L (ref 98–111)
Creatinine, Ser: 1.09 mg/dL — ABNORMAL HIGH (ref 0.44–1.00)
GFR calc Af Amer: 55 mL/min — ABNORMAL LOW (ref >60)
GFR calc non Af Amer: 47 mL/min — ABNORMAL LOW (ref >60)
Glucose, Bld: 92 mg/dL (ref 70–99)
Potassium: 3.6 mmol/L (ref 3.5–5.1)
Sodium: 136 mmol/L (ref 135–145)

## 2019-03-22 LAB — HEMOGLOBIN AND HEMATOCRIT, BLOOD
HCT: 26.8 % — ABNORMAL LOW (ref 36.0–46.0)
Hemoglobin: 8.4 g/dL — ABNORMAL LOW (ref 12.0–15.0)

## 2019-03-22 LAB — VANCOMYCIN, PEAK: Vancomycin Pk: 22 ug/mL — ABNORMAL LOW (ref 30–40)

## 2019-03-22 LAB — GLUCOSE, CAPILLARY
Glucose-Capillary: 80 mg/dL (ref 70–99)
Glucose-Capillary: 144 mg/dL — ABNORMAL HIGH (ref 70–99)
Glucose-Capillary: 116 mg/dL — ABNORMAL HIGH (ref 70–99)
Glucose-Capillary: 131 mg/dL — ABNORMAL HIGH (ref 70–99)

## 2019-03-22 LAB — PREPARE RBC (CROSSMATCH)

## 2019-03-22 MED ORDER — SODIUM CHLORIDE 0.9% IV SOLUTION: Freq: Once | INTRAVENOUS | Status: AC

## 2019-03-22 MED ORDER — SODIUM CHLORIDE 0.9% FLUSH
10.0000 mL | INTRAVENOUS | Status: DC | PRN
  Administered 2019-03-26 – 2019-03-27 (×2): 20 mL

## 2019-03-22 MED ORDER — SODIUM CHLORIDE 0.9% FLUSH
10.0000 mL | Freq: Two times a day (BID) | INTRAVENOUS | Status: DC
  Administered 2019-03-22 – 2019-03-29 (×13): 10 mL

## 2019-03-22 NOTE — Progress Notes (Signed)
Peripherally Inserted Central Catheter/Midline Placement  The IV Nurse has discussed with the patient and/or persons authorized to consent for the patient, the purpose of this procedure and the potential benefits and risks involved with this procedure.  The benefits include less needle sticks, lab draws from the catheter, and the patient may be discharged home with the catheter. Risks include, but not limited to, infection, bleeding, blood clot (thrombus formation), and puncture of an artery; nerve damage and irregular heartbeat and possibility to perform a PICC exchange if needed/ordered by physician.  Alternatives to this procedure were also discussed.  Bard Power PICC patient education guide, fact sheet on infection prevention and patient information card has been provided to patient /or left at bedside.  Consent obtained by Fredrik Cove, RN at 1350.  Upon entering room asked patient if she had any questions regarding PICC.  Patient's daughter states that she seems a little confused this afternoon.  Discussed above with patient's daughter including risk benefit and purpose of PICC.  She is agreeable.  PICC/Midline Placement Documentation  PICC Single Lumen 0000000 PICC Left Cephalic 38 cm 0 cm (Active)  Indication for Insertion or Continuance of Line Home intravenous therapies (PICC only);Prolonged intravenous therapies 03/22/19 1747  Exposed Catheter (cm) 0 cm 03/22/19 1747  Site Assessment Clean;Dry;Intact 03/22/19 1747  Line Status Flushed;Saline locked;Blood return noted 03/22/19 1747  Dressing Type Transparent 03/22/19 1747  Dressing Status Clean;Dry;Intact 03/22/19 1747  Dressing Intervention New dressing 03/22/19 [id number]  Dressing Change Due 03/29/19 03/22/19 1747       Farrah Skoda, Nicolette Bang 03/22/2019, 5:48 PM

## 2019-03-22 NOTE — Consult Note (Signed)
Pharmacy Antibiotic Note  Brooke Baker is a 85 y.o. female admitted on 03/18/2019 with sepsis. CXR impression of progression of lung disease versus superimposed pneumonia. Pharmacy has been consulted for vancomycin dosing. Patient is also on azithromycin. Patient was febrile to 103 on presentation which has resolved.   Tmax 24h 100.4F. No leukocytosis but patient with possible hypo proliferative process per hematology .   3/14 Blood cultures with 2/4 bottles (same set) MRSA. Repeat blood cultures 3/15 no growth x 24h. Infectious diseases following. TEE without evidence of endocarditis.   Plan:  Vancomycin 750 mg IV Q 24 hrs. Goal AUC 400-550. Expected AUC: 475.1, trough 14.1 SCr used: 1.36  Scr improving (1.12 >> 1.36 >> 1.09). Continue to follow daily Scr while on vancomycin. Will get levels after 3rd maintenance dose today.   Patient will be on prolonged therapy for 4 weeks per ID due to presence of prosthetic joints.  Height: 5\' 6"  (167.6 cm) Weight: 238 lb 8.6 oz (108.2 kg) IBW/kg (Calculated) : 59.3  Temp (24hrs), Avg:98.7 F (37.1 C), Min:97.9 F (36.6 C), Max:99.2 F (37.3 C)  Recent Labs  Lab 03/19/19 0036 03/19/19 0812 03/20/19 0450 03/21/19 0643  WBC 5.4 8.2 5.0 4.7  CREATININE 1.12* 1.26* 1.24* 1.36*  LATICACIDVEN 1.7 1.7  --   --     Estimated Creatinine Clearance: 39.7 mL/min (A) (by C-G formula based on SCr of 1.36 mg/dL (H)).    Allergies  Allergen Reactions  . Doxycycline Diarrhea and Nausea Only  . Methotrexate Rash    Antimicrobials this admission: Cefepime 3/14 x1 Vancomycin 3/14 >>  Ceftriaxone 3/14 >> 3/15 Azithromycin 3/14 >>  Dose adjustments this admission: n/a  Microbiology results: 3/15 BCx: No growth < 24h 3/14 BCx: 2/2 bottles MRSA 3/14 MRSA PCR: positive  Thank you for allowing pharmacy to be a part of this patient's care.  [employer] Resident 03/22/2019 8:34 AM

## 2019-03-22 NOTE — Progress Notes (Signed)
PROGRESS NOTE    Brooke Baker  [id number] DOB: 1934-11-08 DOA: 03/18/2019 PCP: Einar Pheasant, MD   Brief Narrative:  [name], type diabetes mellitus, hypertension, dyslipidemia and coronary artery disease, who presented to the emergency room with acute onset of fever with associated generalized weakness and dyspnea without significant cough and wheezing. She has been having orthopnea and paroxysmal nocturnal dyspnea as well as worsening lower extremity edema. She admits to dyspnea on exertion. She admitted to nausea and vomiting without abdominal pain.  On presentation febrile, chest x-ray with bilateral interstitial densities concerning for disease progression versus superimposed pneumonia. She was started on ceftriaxone and azithromycin. Blood cultures are growing staph aureus.  Antibiotics switched with vancomycin.  03/22/19 Patient was feeling little better when seen today.  No new complaints. Labs shows anemia with hemoglobin of 7.0 and  Her pradaxa has been held for her chronic a.fib.  Oncology is on board as is GI and cardiology. Pt is awake,alert and cooperative and oriented Denies any complaints of chest pain or sob.  Assessment & Plan:   Active Problems:   AF (paroxysmal atrial fibrillation) (HCC)   Macrocytic anemia   Chronic respiratory failure ([place])   Sepsis ([place])   Community acquired pneumonia  Sepsis secondary to MRSA bacteremia and bilateral pneumonia.  TTE and TEE was negative for vegetations.  Patient has hardware due to right hip and knee replacement and high risk for seeding.  Blood culture drawn on 03/20/19 remain negative so far.  ID was consulted-recommending at least 4-week of vancomycin. -PICC line will be placed today -ID is also recommending CT spine before discharge to rule out any osteomyelitis or discitis. -PT/OT are  recommending SNF placement but patient wants to go home with home health services. -Continue supportive care with DuoNeb.  Acute on chronic diastolic CHF, possibly cor pulmonale.  She has a history of preserved EF of 55% with NYHA class II- III ACC/AHA stage C. Patient is on 2 L at home. She was initially diuresed with Lasix 40 mg twice daily, which was discontinued due to increased in creatinine. -Start her on p.o. Lasix 40 mg daily from tomorrow. -Continue to monitor. -Continue daily BMP, strict intake and output and daily weight. -Echocardiogram-with normal EF and no wall motion abnormalities.  Paroxysmal atrial fibrillation.  Currently regular and rate controlled. -Continue Toprol-XL and Pradaxa held for anemia. -Cardiology is following and we appreciate their recommendations.  Hypertension.  Blood pressure within goal. -Continue home antihypertensives.  Macrocytic anemia.  Hemoglobin stable.  Patient follow-up with hematology.  She has rheumatoid factor positive psoriatic arthritis.  Labs were consistent with hypoproliferative bone marrow.  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  They were planning to do a bone marrow evaluation if hemoglobin continued to drop, the time of prior hematology visit her hemoglobin was 9.7.   Hemoglobin dropped to 7.2 today without any obvious bleeding.  After discussing with her hematologist's we will transfuse her with 1 unit of PRBC. -2 unit packed RBCs ordered. -Continue to monitor. -zoloft also causes anemia and bleeding.  Pancytopenia/Thrombocytopenia.  Worsening thrombocytopenia with platelet of 71.  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  Patient is also on Pradaxa which increased her chances of bleeding. Discussed with Dr. Janese Banks who saw the patient and recommending continuation of Pradaxa at this time.  Pradaxa need to be discontinued if  platelets drop below 50,000. -We  will discontinue pradaxa due to anemia -Continue to monitor platelets. -Monitor for any sign of bleeding. -suspect pancytopenia could also be due to combination of antiseizure meds.    History of seizure disorder.  No acute concern. -We will continue home antiseizure meds.  Dyslipidemia. -Continue statin.  Depression. -Continue Zoloft.  Objective: Vitals:   03/22/19 0902 03/22/19 1015 03/22/19 1046 03/22/19 1146  BP: (!) 107/47 (!) 115/44 (!) 104/45 (!) 139/108  Pulse:  (!) 106 (!) 114 (!) 125  Resp:   20 19  Temp:  98 F (36.7 C) 98.6 F (37 C) 98.8 F (37.1 C)  TempSrc:  Oral Oral Oral  SpO2:  100% 100% 100%  Weight:      Height:        Intake/Output Summary (Last 24 hours) at 03/22/2019 1404 Last data filed at 03/22/2019 1100 Gross per 24 hour  Intake 525 ml  Output 1300 ml  Net -775 ml   Filed Weights   03/18/19 2317 03/21/19 0547  Weight: 73.5 kg 108.2 kg    Examination:  General exam: Appears calm and comfortable  Respiratory system: Clear to auscultation. Respiratory effort normal. Cardiovascular system: S1 & S2 heard, RRR. No JVD, murmurs, rubs, gallops or clicks. Gastrointestinal system: Soft, nontender, nondistended, bowel sounds positive. Central nervous system: Alert and oriented. No focal neurological deficits.Symmetric 5 x 5 power. Extremities: 1+LE edema with signs of chronic venous dermatitis, 2+ upper extremity edema, worse on left with ecchymosis,pulses intact and symmetrical. Psychiatry: Judgement and insight appear normal. Mood & affect appropriate.   Consultants:   Hematology  Cardiology  ID  Procedures:  Antimicrobials:  Vancomycin  Data Reviewed: I have personally reviewed following labs and imaging studies  CBC: Recent Labs  Lab 03/19/19 0036 03/19/19 0036 03/19/19 [id number] 03/20/19 0450 03/21/19 0643 03/21/19 2220 03/22/19 0837  WBC 5.4  --  8.2 5.0 4.7  --  3.7*  NEUTROABS 4.6  --   --   --  3.2  --  2.3  HGB 8.8*   <  > 7.7* 8.1* 7.2* 7.6* 7.0*  HCT 28.2*  --  24.1* 25.3* 22.5*  --  21.7*  MCV 109.3*  --  109.5* 107.2* 109.2*  --  108.5*  PLT 83*  --  72* 68* 71*  --  71*   < > = values in this interval not displayed.   Basic Metabolic Panel: Recent Labs  Lab 03/19/19 0036 03/19/19 0812 03/20/19 0450 03/21/19 0643 03/22/19 0837  NA 135 134* 133* 136 136  K 4.0 4.3 4.4 3.7 3.6  CL 106 109 105 107 109  CO2 21* 22 20* 23 20*  GLUCOSE 126* 139* 117* 95 92  BUN 19 22 26* 32* 29*  CREATININE 1.12* 1.26* 1.24* 1.36* 1.09*  CALCIUM 7.5* 7.1* 7.4* 7.5* 7.5*   GFR: Estimated Creatinine Clearance: 49.6 mL/min (A) (by C-G formula based on SCr of 1.09 mg/dL (H)). Liver Function Tests: Recent Labs  Lab 03/19/19 0036  AST 31  ALT 10  ALKPHOS 108  BILITOT 1.5*  PROT 7.4  ALBUMIN 2.4*   Recent Labs  Lab 03/19/19 0036  LIPASE 21   No results for input(s): AMMONIA in the last 168 hours. Coagulation Profile: Recent Labs  Lab 03/19/19 0036 03/21/19 0643  INR 1.7* 2.1*   Cardiac Enzymes: No results for input(s): CKTOTAL, CKMB, CKMBINDEX, TROPONINI in the last 168 hours. BNP (last 3 results) No results for input(s): PROBNP in the last 8760  hours. HbA1C: No results for input(s): HGBA1C in the last 72 hours. CBG: Recent Labs  Lab 03/21/19 1205 03/21/19 1613 03/21/19 2138 03/22/19 0812 03/22/19 1145  GLUCAP 81 92 123* 80 144*   Lipid Profile: No results for input(s): CHOL, HDL, LDLCALC, TRIG, CHOLHDL, LDLDIRECT in the last 72 hours. Thyroid Function Tests: No results for input(s): TSH, T4TOTAL, FREET4, T3FREE, THYROIDAB in the last 72 hours. Anemia Panel: Recent Labs    03/20/19 1325  VITAMINB12 859  FOLATE 4.4*  FERRITIN 168  TIBC 165*  IRON 32  RETICCTPCT 2.0   Sepsis Labs: Recent Labs  Lab 03/19/19 0036 03/19/19 0812  PROCALCITON 0.44  --   LATICACIDVEN 1.7 1.7    Recent Results (from the past 240 hour(s))  Respiratory Panel by RT PCR (Flu A&B, Covid) -  Nasopharyngeal Swab     Status: None   Collection Time: 03/19/19 12:32 AM   Specimen: Nasopharyngeal Swab  Result Value Ref Range Status   SARS Coronavirus 2 by RT PCR NEGATIVE NEGATIVE Final    Comment: (NOTE) SARS-CoV-2 target nucleic acids are NOT DETECTED. The SARS-CoV-2 RNA is generally detectable in upper respiratoy specimens during the acute phase of infection. The lowest concentration of SARS-CoV-2 viral copies this assay can detect is 131 copies/mL. A negative result does not preclude SARS-Cov-2 infection and should not be used as the sole basis for treatment or other patient management decisions. A negative result may occur with  improper specimen collection/handling, submission of specimen other than nasopharyngeal swab, presence of viral mutation(s) within the areas targeted by this assay, and inadequate number of viral copies (<131 copies/mL). A negative result must be combined with clinical observations, patient history, and epidemiological information. The expected result is Negative. Fact Sheet for Patients:  PinkCheek.be Fact Sheet for Healthcare Providers:  GravelBags.it This test is not yet ap proved or cleared by the [place] FDA and  has been authorized for detection and/or diagnosis of SARS-CoV-2 by FDA under an Emergency Use Authorization (EUA). This EUA will remain  in effect (meaning this test can be used) for the duration of the COVID-19 declaration under Section 564(b)(1) of the Act, 21 U.S.C. section 360bbb-3(b)(1), unless the authorization is terminated or revoked sooner.    Influenza A by PCR NEGATIVE NEGATIVE Final   Influenza B by PCR NEGATIVE NEGATIVE Final    Comment: (NOTE) The Xpert Xpress SARS-CoV-2/FLU/RSV assay is intended as an aid in  the diagnosis of influenza from Nasopharyngeal swab specimens and  should not be used as a sole basis for treatment. Nasal washings and  aspirates  are unacceptable for Xpert Xpress SARS-CoV-2/FLU/RSV  testing. Fact Sheet for Patients: PinkCheek.be Fact Sheet for Healthcare Providers: GravelBags.it This test is not yet approved or cleared by the [place] FDA and  has been authorized for detection and/or diagnosis of SARS-CoV-2 by  FDA under an Emergency Use Authorization (EUA). This EUA will remain  in effect (meaning this test can be used) for the duration of the  Covid-19 declaration under Section 564(b)(1) of the Act, 21  U.S.C. section 360bbb-3(b)(1), unless the authorization is  terminated or revoked. Performed at [facility], [address] [place], [place] [address]   Blood Culture (routine x 2)     Status: Abnormal   Collection Time: 03/19/19 12:36 AM   Specimen: BLOOD  Result Value Ref Range Status   Specimen Description   Final    BLOOD LEFT ANTECUBITAL Performed at [facility], [address],  [place], [place] [address]    Special Requests   Final    BOTTLES DRAWN AEROBIC AND ANAEROBIC Blood Culture adequate volume Performed at [facility], [address] [place], [place] [address]    Culture  Setup Time   Final    GRAM POSITIVE COCCI IN BOTH AEROBIC AND ANAEROBIC BOTTLES CRITICAL RESULT CALLED TO, READ BACK BY AND VERIFIED WITH: ALEX CHAPPELL 03/19/19 @ 34 Brooke Baker    Culture METHICILLIN RESISTANT STAPHYLOCOCCUS AUREUS (A)  Final   Report Status 03/21/2019 FINAL  Final   Organism ID, Bacteria METHICILLIN RESISTANT STAPHYLOCOCCUS AUREUS  Final      Susceptibility   Methicillin resistant staphylococcus aureus - MIC*    CIPROFLOXACIN >=8 RESISTANT Resistant     ERYTHROMYCIN >=8 RESISTANT Resistant     GENTAMICIN <=0.5 SENSITIVE Sensitive     OXACILLIN >=4 RESISTANT Resistant     TETRACYCLINE <=1 SENSITIVE Sensitive     VANCOMYCIN <=0.5 SENSITIVE Sensitive     TRIMETH/SULFA <=10 SENSITIVE Sensitive     CLINDAMYCIN  <=0.25 SENSITIVE Sensitive     RIFAMPIN <=0.5 SENSITIVE Sensitive     Inducible Clindamycin NEGATIVE Sensitive     * METHICILLIN RESISTANT STAPHYLOCOCCUS AUREUS  MRSA PCR Screening     Status: Abnormal   Collection Time: 03/19/19 12:25 PM   Specimen: Urine, Clean Catch; Nasopharyngeal  Result Value Ref Range Status   MRSA by PCR POSITIVE (A) NEGATIVE Final    Comment:        The GeneXpert MRSA Assay (FDA approved for NASAL specimens only), is one component of a comprehensive MRSA colonization surveillance program. It is not intended to diagnose MRSA infection nor to guide or monitor treatment for MRSA infections. RESULT CALLED TO, READ BACK BY AND VERIFIED WITH: DENISE [facility] AT [id number] 03/19/19.PMF Performed at [facility], [address] [place], [place] [address]   CULTURE, BLOOD (ROUTINE X 2) w Reflex to ID Panel     Status: None (Preliminary result)   Collection Time: 03/20/19  5:40 PM   Specimen: BLOOD  Result Value Ref Range Status   Specimen Description BLOOD LEFT ANTECUBITAL  Final   Special Requests   Final    BOTTLES DRAWN AEROBIC AND ANAEROBIC Blood Culture adequate volume   Culture   Final    NO GROWTH 2 DAYS Performed at [facility], [address] [place], [place] [address]    Report Status PENDING  Incomplete  CULTURE, BLOOD (ROUTINE X 2) w Reflex to ID Panel     Status: None (Preliminary result)   Collection Time: 03/20/19  5:40 PM   Specimen: BLOOD  Result Value Ref Range Status   Specimen Description BLOOD BLOOD LEFT HAND  Final   Special Requests   Final    BOTTLES DRAWN AEROBIC AND ANAEROBIC Blood Culture adequate volume   Culture   Final    NO GROWTH 2 DAYS Performed at [facility], [address], [place], [place] [address]    Report Status PENDING  Incomplete     Radiology Studies: ECHOCARDIOGRAM COMPLETE  Result Date: 03/20/2019    ECHOCARDIOGRAM REPORT   Patient Name:   BRISHAE LANCASTER Date of Exam: 03/20/2019  Medical Rec #:  [id number]        Height:       66.0 in Accession #:    [id number]       Weight:       162.0 lb Date of Birth:  Jun 29, 1934  BSA:          1.828 m Patient Age:    45 years         BP:           57/93 mmHg Patient Gender: F                HR:           114 bpm. Exam Location:  [facility] Procedure: 2D Echo, Cardiac Doppler and Color Doppler Indications:     Dyspnea 786.09  History:         Patient has no prior history of Echocardiogram examinations.                  CAD, COPD; Risk Factors:Hypertension. CVA.  Sonographer:     Sherrie Sport RDCS (AE) Referring Phys:  [id number] [facility] AMIN Diagnosing Phys: Bartholome Bill MD IMPRESSIONS  1. Left ventricular ejection fraction, by estimation, is 70 to 75%. Left ventricular ejection fraction by PLAX is 75 %. The left ventricle has hyperdynamic function. The left ventricle has no regional wall motion abnormalities. Left ventricular diastolic parameters are consistent with Grade I diastolic dysfunction (impaired relaxation).  2. Right ventricular systolic function is normal. The right ventricular size is mildly enlarged. There is moderately elevated pulmonary artery systolic pressure.  3. Left atrial size was mildly dilated.  4. Right atrial size was mildly dilated.  5. The mitral valve is grossly normal. Mild mitral valve regurgitation.  6. The aortic valve is grossly normal. Aortic valve regurgitation is trivial. FINDINGS  Left Ventricle: Left ventricular ejection fraction, by estimation, is 70 to 75%. Left ventricular ejection fraction by PLAX is 75 % The left ventricle has hyperdynamic function. The left ventricle has no regional wall motion abnormalities. The left ventricular internal cavity size was normal in size. There is borderline left ventricular hypertrophy. Left ventricular diastolic parameters are consistent with Grade I diastolic dysfunction (impaired relaxation). Right Ventricle: The right ventricular size is mildly enlarged. No increase in right  ventricular wall thickness. Right ventricular systolic function is normal. There is moderately elevated pulmonary artery systolic pressure. The tricuspid regurgitant velocity is 3.27 m/s, and with an assumed right atrial pressure of 10 mmHg, the estimated right ventricular systolic pressure is 123XX123 mmHg. Left Atrium: Left atrial size was mildly dilated. Right Atrium: Right atrial size was mildly dilated. Pericardium: There is no evidence of pericardial effusion. Mitral Valve: The mitral valve is grossly normal. Mild mitral valve regurgitation. Tricuspid Valve: The tricuspid valve is grossly normal. Tricuspid valve regurgitation is mild. Aortic Valve: The aortic valve is grossly normal. Aortic valve regurgitation is trivial. Aortic valve mean gradient measures 3.5 mmHg. Aortic valve peak gradient measures 7.2 mmHg. Aortic valve area, by VTI measures 2.48 cm. Pulmonic Valve: The pulmonic valve was grossly normal. Pulmonic valve regurgitation is mild. Aorta: The aortic root was not well visualized. IAS/Shunts: The atrial septum is grossly normal.  LEFT VENTRICLE PLAX 2D LV EF:         Left ventricular ejection fraction by PLAX is 75 % LVIDd:         3.69 cm LVIDs:         2.09 cm LV PW:         1.35 cm LV IVS:        0.78 cm LVOT diam:     2.00 cm LV SV:         58 LV SV Index:   32 LVOT Area:  3.14 cm  RIGHT VENTRICLE RV Basal diam:  4.34 cm LEFT ATRIUM            Index       RIGHT ATRIUM           Index LA diam:      4.70 cm  2.57 cm/m  RA Area:     24.40 cm LA Vol (A2C): 158.0 ml 86.41 ml/m RA Volume:   87.20 ml  47.69 ml/m LA Vol (A4C): 52.8 ml  28.88 ml/m  AORTIC VALVE                   PULMONIC VALVE AV Area (Vmax):    2.20 cm    PV Vmax:        0.71 m/s AV Area (Vmean):   2.43 cm    PV Peak grad:   2.0 mmHg AV Area (VTI):     2.48 cm    RVOT Peak grad: 2 mmHg AV Vmax:           134.00 cm/s AV Vmean:          87.550 cm/s AV VTI:            0.234 m AV Peak Grad:      7.2 mmHg AV Mean Grad:      3.5  mmHg LVOT Vmax:         93.80 cm/s LVOT Vmean:        67.600 cm/s LVOT VTI:          0.185 m LVOT/AV VTI ratio: 0.79  AORTA Ao Root diam: 3.10 cm MITRAL VALVE                TRICUSPID VALVE MV Area (PHT): 4.89 cm     TR Peak grad:   42.8 mmHg MV Decel Time: 155 msec     TR Vmax:        327.00 cm/s MV E velocity: 127.00 cm/s MV A velocity: 128.00 cm/s  SHUNTS MV E/A ratio:  0.99         Systemic VTI:  0.18 m                             Systemic Diam: 2.00 cm Bartholome Bill MD Electronically signed by Bartholome Bill MD Signature Date/Time: 03/20/2019/4:00:44 PM    Final    ECHO TEE  Result Date: 03/21/2019    TRANSESOPHOGEAL ECHO REPORT   Patient Name:   DEEYA REDIFER Date of Exam: 03/21/2019 Medical Rec #:  [id number]        Height:       66.0 in Accession #:    [id number]       Weight:       238.5 lb Date of Birth:  26-Jan-1934        BSA:          2.155 m Patient Age:    68 years         BP:           107/40 mmHg Patient Gender: F                HR:           111 bpm. Exam Location:  [facility] Procedure: Transesophageal Echo, Color Doppler and Cardiac Doppler Indications:     SBE subacute bacterial endocarditis 421.0  History:         Patient has prior  history of Echocardiogram examinations, most                  recent 03/20/2019. COPD; Risk Factors:Hypertension and Diabetes.                  CVA.  Sonographer:     Sherrie Sport RDCS (AE) Referring Phys:  [place] Diagnosing Phys: Bartholome Bill MD PROCEDURE: TEE procedure time was 16 minutes. The transesophogeal probe was passed without difficulty through the esophogus of the patient. Imaged were obtained with the patient in a left lateral decubitus position. Local oropharyngeal anesthetic was provided with viscous lidocaine and Benzocaine spray. Sedation performed by performing physician. Image quality was excellent. The patient's vital signs; including heart rate, blood pressure, and oxygen saturation; remained stable throughout the procedure. The patient  developed no complications during the procedure. IMPRESSIONS  1. Left ventricular ejection fraction, by estimation, is 55 to 60%. The left ventricle has normal function. The left ventricle has no regional wall motion abnormalities.  2. Right ventricular systolic function is normal. The right ventricular size is mildly enlarged.  3. No left atrial/left atrial appendage thrombus was detected.  4. Right atrial size was moderately dilated.  5. The mitral valve is grossly normal. Mild mitral valve regurgitation.  6. Tricuspid valve regurgitation is mild to moderate.  7. The aortic valve is tricuspid. Aortic valve regurgitation is trivial. No aortic stenosis is present. Conclusion(s)/Recommendation(s): No evidence of vegetation/infective endocarditis on this transesophageal echocardiogram. FINDINGS  Left Ventricle: Left ventricular ejection fraction, by estimation, is 55 to 60%. The left ventricle has normal function. The left ventricle has no regional wall motion abnormalities. The left ventricular internal cavity size was normal in size. There is  no left ventricular hypertrophy. Right Ventricle: The right ventricular size is mildly enlarged. No increase in right ventricular wall thickness. Right ventricular systolic function is normal. Left Atrium: Left atrial size was normal in size. No left atrial/left atrial appendage thrombus was detected. Right Atrium: Right atrial size was moderately dilated. Pericardium: There is no evidence of pericardial effusion. Mitral Valve: The mitral valve is grossly normal. Mild mitral valve regurgitation. There is no evidence of mitral valve vegetation. Tricuspid Valve: The tricuspid valve is grossly normal. Tricuspid valve regurgitation is mild to moderate. Aortic Valve: The aortic valve is tricuspid. Aortic valve regurgitation is trivial. No aortic stenosis is present. There is no evidence of aortic valve vegetation. Pulmonic Valve: The pulmonic valve was not well visualized. Pulmonic  valve regurgitation is trivial. Aorta: The aortic root is normal in size and structure. IAS/Shunts: The atrial septum is grossly normal. Bartholome Bill MD Electronically signed by Bartholome Bill MD Signature Date/Time: 03/21/2019/10:50:39 AM    Final    [place] EKG SITE RITE  Result Date: 03/22/2019 If Site Rite image not attached, placement could not be confirmed due to current cardiac rhythm.   Scheduled Meds:  aspirin EC  81 mg Oral Daily   azithromycin  500 mg Oral Daily   Chlorhexidine Gluconate Cloth  6 each Topical Daily   fluocinonide ointment   Topical BID   fluticasone  2 spray Each Nare Daily   folic acid  1 mg Oral Daily   furosemide  40 mg Oral Daily   gabapentin  100 mg Oral BID   insulin aspart  0-9 Units Subcutaneous TID PC & HS   levETIRAcetam  750 mg Oral BID   metoprolol succinate  50 mg Oral Daily   mupirocin ointment  Topical BID   nystatin-triamcinolone   Topical BID   omega-3 acid ethyl esters  1 g Oral Daily   phenytoin  400 mg Oral QHS   polyethylene glycol  17 g Oral Daily   rosuvastatin  20 mg Oral Daily   sertraline  75 mg Oral Daily   sodium chloride flush  10-40 mL Intracatheter Q12H   triamcinolone ointment   Topical TID   Continuous Infusions:  vancomycin 750 mg (03/21/19 1824)     LOS: 3 days   Time spent: 45 minutes.  Para Skeans, MD [facility]  If 7PM-7AM, please contact night-coverage [email or web address]  03/22/2019, 2:04 PM   This record has been created using Systems analyst. Errors have been sought and corrected,but may not always be located. Such creation errors do not reflect on the standard of care.

## 2019-03-22 NOTE — Progress Notes (Signed)
PT Cancellation Note  Patient Details Name: Brooke Baker MRN: [id number] DOB: Jun 02, 1934   Cancelled Treatment:     PT hold. Pt HgB 7.0 and will require transfusion. Hold PT per therapy protocol. Will continue to follow pt per current POC and return next date.    Willette Pa 03/22/2019, 10:28 AM

## 2019-03-22 NOTE — Progress Notes (Signed)
Patient Name: Brooke Baker Date of Encounter: 03/22/2019  Hospital Problem List     Active Problems:   AF (paroxysmal atrial fibrillation) (HCC)   Macrocytic anemia   Chronic respiratory failure ([place])   Sepsis ([place])   Community acquired pneumonia    Patient Profile     85 y.o.femalewith history ofcoronary artery disease status post PCI of the proximal LAD in 2003, status post PCI of an OM1 in 2006, history of paroxysmal atrial fibrillation treated with anticoagulation with Pradaxa, hypertension, hyperlipidemia, history of TIAs, history of diastolic congestive heart failure [place] heart association class II-III ACC/AHA stage C, HFpEF with an EF of 55%. Echocardiogram in 2018 showed an EF of 50 to 55% with moderate TR mild MR and pulmonary regurgitation with mild pulmonary hypertension. This was unchanged from 2012. She presented to emergency room complaining of shortness of breath. She was hemodynamically stable but had a temperature of 103 with a heart rate of 117. Pulse ox of 97% on room air. Laboratory showed a CO2 of 21 BUN and creatinine of 19 and 1.12 respectively. Her BNP was 608. Hemoglobin was 8.8 with hematocrit of 28.2 and a platelet count of 83,000. EKG showed atrial fibrillation with rapid ventricular response. 1 of 2 blood cultures showed Staph aureus. Chest x-ray showed interval progression of interstitial densities consistent with progression of lung disease versus pneumonia. She had a functional study done in January 2021 showing no evidence of ischemia with preserved LV function  Subjective   Swollen arms. Still somewhat weak  Inpatient Medications    . sodium chloride   Intravenous Once  . aspirin EC  81 mg Oral Daily  . azithromycin  500 mg Oral Daily  . Chlorhexidine Gluconate Cloth  6 each Topical Daily  . fluocinonide ointment   Topical BID  . fluticasone  2 spray Each Nare Daily  . folic acid  1 mg Oral Daily  . furosemide  40 mg Oral Daily   . gabapentin  100 mg Oral BID  . insulin aspart  0-9 Units Subcutaneous TID PC & HS  . levETIRAcetam  750 mg Oral BID  . metoprolol succinate  50 mg Oral Daily  . mupirocin ointment   Topical BID  . nystatin-triamcinolone   Topical BID  . omega-3 acid ethyl esters  1 g Oral Daily  . phenytoin  400 mg Oral QHS  . polyethylene glycol  17 g Oral Daily  . rosuvastatin  20 mg Oral Daily  . sertraline  75 mg Oral Daily  . sodium chloride flush  10-40 mL Intracatheter Q12H  . triamcinolone ointment   Topical TID    Vital Signs    Vitals:   03/22/19 0602 03/22/19 0811 03/22/19 0902 03/22/19 1015  BP: (!) 119/43 (!) 104/34 (!) 107/47 (!) 115/44  Pulse: 96 (!) 101  (!) 106  Resp:  18    Temp: 99 F (37.2 C) 99.2 F (37.3 C)  98 F (36.7 C)  TempSrc: Oral Oral  Oral  SpO2: 100% 100%  100%  Weight:      Height:        Intake/Output Summary (Last 24 hours) at 03/22/2019 1031 Last data filed at 03/22/2019 [id number] Gross per 24 hour  Intake 150 ml  Output 1000 ml  Net -850 ml   Filed Weights   03/18/19 2317 03/21/19 0547  Weight: 73.5 kg 108.2 kg    Physical Exam    GEN: Well nourished, well developed, in no acute  distress.  HEENT: normal.  Neck: Supple, no JVD, carotid bruits, or masses. Cardiac:irr, irr, no murmurs, rubs, or gallops. No clubbing, cyanosis, edema.  Radials/DP/PT 2+ and equal bilaterally.  Respiratory:  Respirations regular and unlabored, clear to auscultation bilaterally. GI: Soft, nontender, nondistended, BS + x 4. MS: no deformity or atrophy. Skin: warm and dry, no rash.Bilateral upper extremety edema Neuro:  Strength and sensation are intact. Psych: Normal affect.  Labs    CBC Recent Labs    03/21/19 0643 03/21/19 0643 03/21/19 2220 03/22/19 0837  WBC 4.7  --   --  3.7*  NEUTROABS 3.2  --   --  2.3  HGB 7.2*   < > 7.6* 7.0*  HCT 22.5*  --   --  21.7*  MCV 109.2*  --   --  108.5*  PLT 71*  --   --  71*   < > = values in this interval not  displayed.   Basic Metabolic Panel Recent Labs    03/20/19 0450 03/21/19 0643  NA 133* 136  K 4.4 3.7  CL 105 107  CO2 20* 23  GLUCOSE 117* 95  BUN 26* 32*  CREATININE 1.24* 1.36*  CALCIUM 7.4* 7.5*   Liver Function Tests No results for input(s): AST, ALT, ALKPHOS, BILITOT, PROT, ALBUMIN in the last 72 hours. No results for input(s): LIPASE, AMYLASE in the last 72 hours. Cardiac Enzymes No results for input(s): CKTOTAL, CKMB, CKMBINDEX, TROPONINI in the last 72 hours. BNP No results for input(s): BNP in the last 72 hours. D-Dimer No results for input(s): DDIMER in the last 72 hours. Hemoglobin A1C No results for input(s): HGBA1C in the last 72 hours. Fasting Lipid Panel No results for input(s): CHOL, HDL, LDLCALC, TRIG, CHOLHDL, LDLDIRECT in the last 72 hours. Thyroid Function Tests No results for input(s): TSH, T4TOTAL, T3FREE, THYROIDAB in the last 72 hours.  Invalid input(s): FREET3  Telemetry    afib  ECG    afib  Radiology    CT HEAD WO CONTRAST  Result Date: 03/19/2019 CLINICAL DATA:  85 year old female with fall. EXAM: CT HEAD WITHOUT CONTRAST TECHNIQUE: Contiguous axial images were obtained from the base of the skull through the vertex without intravenous contrast. COMPARISON:  Head CT dated 09/08/2018. FINDINGS: Brain: There is moderate age-related atrophy and chronic microvascular ischemic changes. There is no acute intracranial hemorrhage. No mass effect or midline shift. No extra-axial fluid collection. Vascular: No hyperdense vessel or unexpected calcification. Skull: Normal. Negative for fracture or focal lesion. Sinuses/Orbits: There is opacification of the left frontal sinuses and maxillary sinuses with remodeling of the medial wall of the right maxillary sinus. Opacification of the left sphenoid sinuses. No air-fluid level. The mastoid air cells are clear. Other: None IMPRESSION: 1. No acute intracranial pathology. 2. Age-related atrophy and chronic  microvascular ischemic changes. 3. Chronic paranasal sinus disease. Electronically Signed   By: Anner Crete M.D.   On: 03/19/2019 22:31   DG Chest Port 1 View  Result Date: 03/18/2019 CLINICAL DATA:  85 year old female with sepsis. EXAM: PORTABLE CHEST 1 VIEW COMPARISON:  Chest radiograph dated 04/20/2017. FINDINGS: There is chronic interstitial coarsening and bronchitic changes. Overall slight interval progression of interstitial densities since the prior radiograph which may represent progression of background of lung disease although superimposed pneumonia is not excluded. Clinical correlation is recommended. No focal consolidation or pneumothorax. Trace bilateral pleural effusions may be present. There is mild cardiomegaly. Atherosclerotic calcification of the aorta. No acute osseous pathology. IMPRESSION: Slight  interval progression of interstitial densities may represent progression of lung disease versus superimposed pneumonia. Electronically Signed   By: Anner Crete M.D.   On: 03/18/2019 23:43   ECHOCARDIOGRAM COMPLETE  Result Date: 03/20/2019    ECHOCARDIOGRAM REPORT   Patient Name:   Brooke Baker Date of Exam: 03/20/2019 Medical Rec #:  [id number]        Height:       66.0 in Accession #:    [id number]       Weight:       162.0 lb Date of Birth:  1934/08/06        BSA:          1.828 m Patient Age:    33 years         BP:           57/93 mmHg Patient Gender: F                HR:           114 bpm. Exam Location:  [facility] Procedure: 2D Echo, Cardiac Doppler and Color Doppler Indications:     Dyspnea 786.09  History:         Patient has no prior history of Echocardiogram examinations.                  CAD, COPD; Risk Factors:Hypertension. CVA.  Sonographer:     Sherrie Sport RDCS (AE) Referring Phys:  [id number] [facility] AMIN Diagnosing Phys: Bartholome Bill MD IMPRESSIONS  1. Left ventricular ejection fraction, by estimation, is 70 to 75%. Left ventricular ejection fraction by PLAX is 75 %. The left  ventricle has hyperdynamic function. The left ventricle has no regional wall motion abnormalities. Left ventricular diastolic parameters are consistent with Grade I diastolic dysfunction (impaired relaxation).  2. Right ventricular systolic function is normal. The right ventricular size is mildly enlarged. There is moderately elevated pulmonary artery systolic pressure.  3. Left atrial size was mildly dilated.  4. Right atrial size was mildly dilated.  5. The mitral valve is grossly normal. Mild mitral valve regurgitation.  6. The aortic valve is grossly normal. Aortic valve regurgitation is trivial. FINDINGS  Left Ventricle: Left ventricular ejection fraction, by estimation, is 70 to 75%. Left ventricular ejection fraction by PLAX is 75 % The left ventricle has hyperdynamic function. The left ventricle has no regional wall motion abnormalities. The left ventricular internal cavity size was normal in size. There is borderline left ventricular hypertrophy. Left ventricular diastolic parameters are consistent with Grade I diastolic dysfunction (impaired relaxation). Right Ventricle: The right ventricular size is mildly enlarged. No increase in right ventricular wall thickness. Right ventricular systolic function is normal. There is moderately elevated pulmonary artery systolic pressure. The tricuspid regurgitant velocity is 3.27 m/s, and with an assumed right atrial pressure of 10 mmHg, the estimated right ventricular systolic pressure is 123XX123 mmHg. Left Atrium: Left atrial size was mildly dilated. Right Atrium: Right atrial size was mildly dilated. Pericardium: There is no evidence of pericardial effusion. Mitral Valve: The mitral valve is grossly normal. Mild mitral valve regurgitation. Tricuspid Valve: The tricuspid valve is grossly normal. Tricuspid valve regurgitation is mild. Aortic Valve: The aortic valve is grossly normal. Aortic valve regurgitation is trivial. Aortic valve mean gradient measures 3.5 mmHg.  Aortic valve peak gradient measures 7.2 mmHg. Aortic valve area, by VTI measures 2.48 cm. Pulmonic Valve: The pulmonic valve was grossly normal. Pulmonic valve regurgitation is mild. Aorta: The aortic root was  not well visualized. IAS/Shunts: The atrial septum is grossly normal.  LEFT VENTRICLE PLAX 2D LV EF:         Left ventricular ejection fraction by PLAX is 75 % LVIDd:         3.69 cm LVIDs:         2.09 cm LV PW:         1.35 cm LV IVS:        0.78 cm LVOT diam:     2.00 cm LV SV:         58 LV SV Index:   32 LVOT Area:     3.14 cm  RIGHT VENTRICLE RV Basal diam:  4.34 cm LEFT ATRIUM            Index       RIGHT ATRIUM           Index LA diam:      4.70 cm  2.57 cm/m  RA Area:     24.40 cm LA Vol (A2C): 158.0 ml 86.41 ml/m RA Volume:   87.20 ml  47.69 ml/m LA Vol (A4C): 52.8 ml  28.88 ml/m  AORTIC VALVE                   PULMONIC VALVE AV Area (Vmax):    2.20 cm    PV Vmax:        0.71 m/s AV Area (Vmean):   2.43 cm    PV Peak grad:   2.0 mmHg AV Area (VTI):     2.48 cm    RVOT Peak grad: 2 mmHg AV Vmax:           134.00 cm/s AV Vmean:          87.550 cm/s AV VTI:            0.234 m AV Peak Grad:      7.2 mmHg AV Mean Grad:      3.5 mmHg LVOT Vmax:         93.80 cm/s LVOT Vmean:        67.600 cm/s LVOT VTI:          0.185 m LVOT/AV VTI ratio: 0.79  AORTA Ao Root diam: 3.10 cm MITRAL VALVE                TRICUSPID VALVE MV Area (PHT): 4.89 cm     TR Peak grad:   42.8 mmHg MV Decel Time: 155 msec     TR Vmax:        327.00 cm/s MV E velocity: 127.00 cm/s MV A velocity: 128.00 cm/s  SHUNTS MV E/A ratio:  0.99         Systemic VTI:  0.18 m                             Systemic Diam: 2.00 cm Bartholome Bill MD Electronically signed by Bartholome Bill MD Signature Date/Time: 03/20/2019/4:00:44 PM    Final    ECHO TEE  Result Date: 03/21/2019    TRANSESOPHOGEAL ECHO REPORT   Patient Name:   Brooke Baker Date of Exam: 03/21/2019 Medical Rec #:  [id number]        Height:       66.0 in Accession #:     [id number]       Weight:       238.5 lb Date of Birth:  [birth year]  BSA:          2.155 m Patient Age:    31 years         BP:           107/40 mmHg Patient Gender: F                HR:           111 bpm. 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 COPD; Risk Factors:Hypertension and Diabetes.                  CVA.  Sonographer:     Sherrie Sport RDCS (AE) Referring Phys:  [place] Diagnosing Phys: Bartholome Bill MD PROCEDURE: TEE procedure time was 16 minutes. The transesophogeal probe was passed without difficulty through the esophogus of the patient. Imaged were obtained with the patient in a left lateral decubitus position. Local oropharyngeal anesthetic was provided with viscous lidocaine and Benzocaine spray. Sedation performed by performing physician. Image quality was excellent. The patient's vital signs; including heart rate, blood pressure, and oxygen saturation; remained stable throughout the procedure. The patient developed no complications during the procedure. IMPRESSIONS  1. Left ventricular ejection fraction, by estimation, is 55 to 60%. The left ventricle has normal function. The left ventricle has no regional wall motion abnormalities.  2. Right ventricular systolic function is normal. The right ventricular size is mildly enlarged.  3. No left atrial/left atrial appendage thrombus was detected.  4. Right atrial size was moderately dilated.  5. The mitral valve is grossly normal. Mild mitral valve regurgitation.  6. Tricuspid valve regurgitation is mild to moderate.  7. The aortic valve is tricuspid. Aortic valve regurgitation is trivial. No aortic stenosis is present. Conclusion(s)/Recommendation(s): No evidence of vegetation/infective endocarditis on this  transesophageal echocardiogram. FINDINGS  Left Ventricle: Left ventricular ejection fraction, by estimation, is 55 to 60%. The left ventricle has normal function. The left ventricle has no regional wall motion abnormalities. The left ventricular internal cavity size was normal in size. There is  no left ventricular hypertrophy. Right Ventricle: The right ventricular size is mildly enlarged. No increase in right ventricular wall thickness. Right ventricular systolic function is normal. Left Atrium: Left atrial size was normal in size. No left atrial/left atrial appendage thrombus was detected. Right Atrium: Right atrial size was moderately dilated. Pericardium: There is no evidence of pericardial effusion. Mitral Valve: The mitral valve is grossly normal. Mild mitral valve regurgitation. There is no evidence of mitral valve vegetation. Tricuspid Valve: The tricuspid valve is grossly normal. Tricuspid valve regurgitation is mild to moderate. Aortic Valve: The aortic valve is tricuspid. Aortic valve regurgitation is trivial. No aortic stenosis is present. There is no evidence of aortic valve vegetation. Pulmonic Valve: The pulmonic valve was not well visualized. Pulmonic valve regurgitation is trivial. Aorta: The aortic root is normal in size and structure. IAS/Shunts: The atrial septum is grossly normal. Bartholome Bill MD Electronically signed by Bartholome Bill MD Signature Date/Time: 03/21/2019/10:50:39 AM    Final     Assessment & Plan    Septicemia- No echo evidence of endocarditis.     Both transthoracic and transesophageal echo showed no evidence of  vegetations with good valve function.    Would continue with antibiotic therapy.  Thrombocytopenia-etiology unclear.   Most recent value was 71,000.  Hemoglobin decreased to 7.2 with hematocrit of 22.5.Patient's CHA2DS2-VASc score is quite high at at least 7 however would continue to hold Pradaxa due to worsening anemia. Consider hematologic work-up to  determine source of worsening thrombocytopenia.  Will need discussed resuming Pradaxa at time of discharge.  HFpEF-EF normal by echo . Also normal by functional study done in January. There was no evidence of ischemia at that time.  Renal function slightly decreased since admission.  Likely overdiuresis.  Does not report to be in florid pulmonary edema. Patient does not appear to be in florid pulmonary edema at present.  Agree with holding diuresis and careful rehydration.  Peripheral edema does not appear to be secondary to extreme intravascular volume overload.  Coronary artery disease-status post PCI as mentioned above. Does not appear ischemic at present. We will continue to follow. We will continue aspirin for now however this may need to be addressed as platelet count varies.   Signed, Javier Docker Chandel Zaun MD 03/22/2019, 10:31 AM  Pager: [phone]

## 2019-03-22 NOTE — TOC Initial Note (Signed)
Transition of Care [facility]) - Initial/Assessment Note    Patient Details  Name: Brooke Baker MRN: [id number] Date of Birth: 02/17/1934  Transition [facility] CM/SW Contact:    Victorino Dike, RN Phone Number: 03/22/2019, 10:58 AM  Clinical Narrative:                  Assessment with patient in room today.  Patient is pleasant to talk to you.  She reports living at home with her husband and wants to return home with him.  She is refusing recommendation for SNF facility, but would like to have home health.  Her husband does all shopping and errands at this time.  They have a daughter that visits multiple times throughout the week and helps manage healthcare.  Patient would like to use previous [facility] agency, but does not remember name.  She has asked me to reach out to her daughter to find out agency name.  I have left a voice mail for Tammy to return my call.   Expected Discharge Plan: [place] Barriers to Discharge: Continued Medical Work up   Patient Goals and CMS Choice Patient states their goals for this hospitalization and ongoing recovery are:: I want to go with my husband CMS Medicare.gov Compare Post Acute Care list provided to:: Patient Choice offered to / list presented to : Patient  Expected Discharge Plan and Services Expected Discharge Plan: [place] In-house Referral: Clinical Social Work Discharge Planning Services: CM Consult Post Acute Care Choice: [place] arrangements for the past 2 months: Single Family Home                 DME Arranged: N/A DME Agency: NA       HH Arranged: NA [place] Agency: NA        Prior Living Arrangements/Services Living arrangements for the past 2 months: [place] with:: Self, Spouse Patient language and need for interpreter reviewed:: Yes Do you feel safe going back to the place where you live?: Yes      Need for Family Participation in Patient Care: No  (Comment) Care giver support system in place?: Yes (comment) Current home services: DME Criminal Activity/Legal Involvement Pertinent to Current Situation/Hospitalization: No - Comment as needed  Activities of Daily Living Home Assistive Devices/Equipment: Cane (specify quad or straight), Walker (specify type) ADL Screening (condition at time of admission) Patient's cognitive ability adequate to safely complete daily activities?: No Is the patient deaf or have difficulty hearing?: No Does the patient have difficulty seeing, even when wearing glasses/contacts?: Yes Does the patient have difficulty concentrating, remembering, or making decisions?: Yes Patient able to express need for assistance with ADLs?: Yes Does the patient have difficulty dressing or bathing?: Yes Independently performs ADLs?: No Communication: Independent Dressing (OT): Independent Grooming: Independent Feeding: Independent Bathing: Independent Toileting: Independent In/Out Bed: Dependent Is this a change from baseline?: Pre-admission baseline Walks in Home: Dependent Is this a change from baseline?: Pre-admission baseline Does the patient have difficulty walking or climbing stairs?: Yes Weakness of Legs: Both Weakness of Arms/Hands: Both  Permission Sought/Granted                  Emotional Assessment Appearance:: Appears stated age Attitude/Demeanor/Rapport: Engaged Affect (typically observed): Accepting, Pleasant Orientation: : Oriented to Self, Oriented to Place, Oriented to Situation Alcohol / Substance Use: Not Applicable Psych Involvement: No (comment)  Admission diagnosis:  Sepsis ([place]) [A41.9] Chronic respiratory  failure, unspecified whether with hypoxia or hypercapnia ([place]) [J96.10] Community acquired pneumonia, unspecified laterality [J18.9] Sepsis, due to unspecified organism, unspecified whether acute organ dysfunction present [facility]) [A41.9] Patient Active Problem List   Diagnosis Date  Noted  . Community acquired pneumonia   . Sepsis ([place]) 03/19/2019  . Chronic respiratory failure ([place]) 02/05/2019  . Macrocytic anemia 01/14/2019  . Skin lesion 11/13/2018  . Leg lesion 11/13/2018  . Cough 09/25/2018  . Fall 09/25/2018  . Thrombocytopenia ([place]) 09/11/2018  . Hyperglycemia 09/11/2018  . ILD (interstitial lung disease) ([place]) 01/14/2018  . CHF (congestive heart failure) ([place]) 07/01/2017  . Therapeutic drug monitoring 03/25/2017  . BMI 39.0-39.9,adult 03/07/2017  . Falls frequently 03/18/2016  . Psoriatic arthritis ([place]) 05/14/2015  . Health care maintenance 10/31/2014  . Stress 12/03/2013  . Obesity 08/01/2013  . Neuropathy 08/01/2013  . Macrocytosis 08/01/2013  . Hypertension 12/18/2011  . Chronic back pain 12/18/2011  . Seizure disorder ([place]) 12/18/2011  . AF (paroxysmal atrial fibrillation) ([place]) 12/18/2011  . Hypercholesterolemia 12/18/2011  . Asthma, moderate persistent 04/08/2009  . Coronary atherosclerosis 04/19/2007  . History of CVA (cerebrovascular accident) 04/19/2007  . ASTHMATIC BRONCHITIS, ACUTE 04/19/2007  . Seasonal and perennial allergic rhinitis 04/19/2007   PCP:  Einar Pheasant, MD Pharmacy:   [facility], [place] [address] Phone: [phone] Fax: [place], [place] [address] [place] [address] Phone: [phone] Fax: [place], [place] [address] Phone: [phone] Fax: [phone]     Social Determinants of Health (SDOH) Interventions    Readmission Risk Interventions No flowsheet data found.

## 2019-03-22 NOTE — Evaluation (Signed)
Occupational Therapy Evaluation Patient Details Name: Brooke Baker MRN: [id number] DOB: 1934/07/21 Today's Date: 03/22/2019    History of Present Illness Brooke Baker  is a 85 y.o. Caucasian female with a known history of multiple medical problems that are mentioned below including COPD, type diabetes mellitus, hypertension, dyslipidemia and coronary artery disease, who presented to the emergency room with acute onset of fever with associated generalized weakness and dyspnea without significant cough and wheezing. Found to be septic with progressive lung disease with pneumonia. Patient on chronic O2 3-4L, is ind with ADLs with RW.  Patient recently with downtrending HCT and Hgb recieving blood transfusion on 03/21/19 and 03/22/19.   Clinical Impression   Patient seen this AM for OT evaluation.  Patient received blood transfusion on 03/21/19 and is pending one today.  Hgb 7.0 and HCT 21.7.  Spoke with nurse prior to session to discuss contraindications and limitations of session.  Nurse agreeable to simple bed level activities.  Patient alert, oriented, pleasant and cooperative upon entry.  Prior to hospitalization, patient completed all BADLs at MOD I.  Patient agreeable to simple self care tasks at bed level requiring set up/SPV and extra time due to lethargy.  Supplemental 02 on for duration of session.  No complaints of pain.  HR remained between 112-115 during activities.  Assisted patient with positioning of L UE under pillows to reduce swelling.  Provided education on goals of occupational therapy in acute setting.  Verbalized understanding.  Patient would benefit from continued services to address the outlined performance components below.  Based on today's performance and medical status, recommending SNF at discharge.     Follow Up Recommendations  SNF    Equipment Recommendations  Other (comment)(defer to next level of care)    Recommendations for Other Services       Precautions /  Restrictions Precautions Precautions: Other (comment) Precaution Comments: Nurse recommending minimal bed level activities d/t Hgb 7.0 Restrictions Weight Bearing Restrictions: No      Mobility Bed Mobility                  Transfers                 General transfer comment: not able to assess at this time    Balance                                           ADL either performed or assessed with clinical judgement   ADL Overall ADL's : Needs assistance/impaired Eating/Feeding: Set up;Bed level Eating/Feeding Details (indicate cue type and reason): Requires assistance opening some containers Grooming: Wash/dry hands;Wash/dry face;Brushing hair;Bed level;Set up;Supervision/safety Grooming Details (indicate cue type and reason): Extra time due to weakness and cues to keep supplemental 02 on                               General ADL Comments: Patient is significantly limited at this time due to Hgb at 7.0 this AM.  Awaiting blood transfusion.  Nurse recommending only minimal bed level activities.     Vision Patient Visual Report: No change from baseline       Perception     Praxis      Pertinent Vitals/Pain Pain Assessment: No/denies pain     Hand Dominance Right   Extremity/Trunk Assessment Upper  Extremity Assessment Upper Extremity Assessment: Generalized weakness   Lower Extremity Assessment Lower Extremity Assessment: Defer to PT evaluation       Communication Communication Communication: No difficulties   Cognition Arousal/Alertness: Awake/alert Behavior During Therapy: WFL for tasks assessed/performed Overall Cognitive Status: Within Functional Limits for tasks assessed                                 General Comments: A&Ox4, pleasant and cooperative   General Comments  Unable to assess OOB activity at this time.  L UE presenting with increased swelling and elevated with pillows at bed  level.    Exercises Other Exercises Other Exercises: Patient able to participate in minimal bed level activities this date d/t Hgb 7.0 Other Exercises: Performed simple grooming tasks at bed level with set up/SPV and extra time d/t weakness Other Exercises: Assisted patient with positioning of L UE d/t swelling Other Exercises: Provided education on goals and role of OT in acute care setting Other Exercises: Provided general safety education on use of call light, bed controls, etc   Shoulder Instructions      Home Living Family/patient expects to be discharged to:: Private residence Living Arrangements: Spouse/significant other Available Help at Discharge: Family;Available 24 hours/day Type of Home: House Home Access: Ramped entrance     Home Layout: One level     Bathroom Shower/Tub: Occupational psychologist: Standard     Home Equipment: Wheelchair - manual;Cane - single point;Walker - 4 wheels;Grab bars - toilet;Bedside commode   Additional Comments: Uses RW for all ambulation, ind with ADs      Prior Functioning/Environment Level of Independence: Needs assistance  Gait / Transfers Assistance Needed: RW for all ambulation ADL's / Homemaking Assistance Needed: daughter cleans house, husband does chores Communication / Swallowing Assistance Needed: n/a          OT Problem List: Decreased strength;Decreased activity tolerance;Decreased knowledge of precautions      OT Treatment/Interventions: Self-care/ADL training;Therapeutic exercise;Energy conservation;Therapeutic activities;Patient/family education    OT Goals(Current goals can be found in the care plan section) Acute Rehab OT Goals Patient Stated Goal: "feel like myself again" OT Goal Formulation: With patient Time For Goal Achievement: 04/05/19 Potential to Achieve Goals: Good  OT Frequency: Min 1X/week   Barriers to D/C:            Co-evaluation              AM-PAC OT "6 Clicks" Daily  Activity     Outcome Measure Help from another person eating meals?: A Little Help from another person taking care of personal grooming?: None Help from another person toileting, which includes using toliet, bedpan, or urinal?: A Lot Help from another person bathing (including washing, rinsing, drying)?: A Lot Help from another person to put on and taking off regular upper body clothing?: A Lot Help from another person to put on and taking off regular lower body clothing?: A Lot 6 Click Score: 15   End of Session Nurse Communication: Precautions;Other (comment)(Appropriateness for evaluation and limitations of therapy)  Activity Tolerance: Treatment limited secondary to medical complications (Comment);Patient tolerated treatment well Patient left: in bed;with call bell/phone within reach;with bed alarm set  OT Visit Diagnosis: Muscle weakness (generalized) (M62.81);History of falling (Z91.81)                Time: [id number] OT Time Calculation (min): 17 min Charges:  OT General  Charges $OT Visit: 1 Visit OT Evaluation $OT Eval Low Complexity: 1 Low OT Treatments $Self Care/Home Management : 8-22 mins  Baldomero [name], MS, OTR/L 03/22/19, 10:32 AM

## 2019-03-23 ENCOUNTER — Inpatient Hospital Stay: Payer: Medicare PPO

## 2019-03-23 LAB — CBC WITH DIFFERENTIAL/PLATELET
Abs Immature Granulocytes: 0.02 10*3/uL (ref 0.00–0.07)
Basophils Absolute: 0 10*3/uL (ref 0.0–0.1)
Basophils Relative: 0 %
Eosinophils Absolute: 0.7 10*3/uL — ABNORMAL HIGH (ref 0.0–0.5)
Eosinophils Relative: 13 %
HCT: 26.6 % — ABNORMAL LOW (ref 36.0–46.0)
Hemoglobin: 8.6 g/dL — ABNORMAL LOW (ref 12.0–15.0)
Immature Granulocytes: 0 %
Lymphocytes Relative: 15 %
Lymphs Abs: 0.8 10*3/uL (ref 0.7–4.0)
MCH: 33.9 pg (ref 26.0–34.0)
MCHC: 32.3 g/dL (ref 30.0–36.0)
MCV: 104.7 fL — ABNORMAL HIGH (ref 80.0–100.0)
Monocytes Absolute: 0.8 10*3/uL (ref 0.1–1.0)
Monocytes Relative: 16 %
Neutro Abs: 2.9 10*3/uL (ref 1.7–7.7)
Neutrophils Relative %: 56 %
Platelets: 90 10*3/uL — ABNORMAL LOW (ref 150–400)
RBC: 2.54 MIL/uL — ABNORMAL LOW (ref 3.87–5.11)
RDW: 15.9 % — ABNORMAL HIGH (ref 11.5–15.5)
WBC: 5.2 10*3/uL (ref 4.0–10.5)
nRBC: 0 % (ref 0.0–0.2)

## 2019-03-23 LAB — BASIC METABOLIC PANEL
Anion gap: 3 — ABNORMAL LOW (ref 5–15)
BUN: 24 mg/dL — ABNORMAL HIGH (ref 8–23)
CO2: 25 mmol/L (ref 22–32)
Calcium: 7.4 mg/dL — ABNORMAL LOW (ref 8.9–10.3)
Chloride: 107 mmol/L (ref 98–111)
Creatinine, Ser: 0.99 mg/dL (ref 0.44–1.00)
GFR calc Af Amer: >60 mL/min (ref >60)
GFR calc non Af Amer: 53 mL/min — ABNORMAL LOW (ref >60)
Glucose, Bld: 90 mg/dL (ref 70–99)
Potassium: 3.5 mmol/L (ref 3.5–5.1)
Sodium: 135 mmol/L (ref 135–145)

## 2019-03-23 LAB — BPAM RBC
Blood Product Expiration Date: 202103302359
Blood Product Expiration Date: 202104152359
ISSUE DATE / TIME: 202103161439
ISSUE DATE / TIME: 202103171027
Unit Type and Rh: 5100
Unit Type and Rh: 5100

## 2019-03-23 LAB — TYPE AND SCREEN
ABO/RH(D): O POS
Antibody Screen: NEGATIVE
Unit division: 0
Unit division: 0

## 2019-03-23 LAB — GLUCOSE, CAPILLARY
Glucose-Capillary: 90 mg/dL (ref 70–99)
Glucose-Capillary: 92 mg/dL (ref 70–99)
Glucose-Capillary: 124 mg/dL — ABNORMAL HIGH (ref 70–99)
Glucose-Capillary: 111 mg/dL — ABNORMAL HIGH (ref 70–99)

## 2019-03-23 LAB — VANCOMYCIN, TROUGH: Vancomycin Tr: 15 ug/mL (ref 15–20)

## 2019-03-23 MED ORDER — POTASSIUM CHLORIDE CRYS ER 20 MEQ PO TBCR
40.0000 meq | EXTENDED_RELEASE_TABLET | Freq: Once | ORAL | Status: AC
  Administered 2019-03-23: 40 meq via ORAL
  Filled 2019-03-23: qty 2

## 2019-03-23 MED ORDER — VANCOMYCIN HCL IN DEXTROSE 1-5 GM/200ML-% IV SOLN
1000.0000 mg | INTRAVENOUS | Status: DC
  Administered 2019-03-23 – 2019-03-26 (×4): 1000 mg via INTRAVENOUS
  Filled 2019-03-23 (×5): qty 200

## 2019-03-23 NOTE — Progress Notes (Signed)
Infectious Disease Long Term IV Antibiotic Orders Brooke Baker August 27, 1934  Diagnosis: MRSA bacteremia, Neg TEE, has joint hardware  Culture results Results for orders placed or performed during the hospital encounter of 03/18/19  Respiratory Panel by RT PCR (Flu A&B, Covid) - Nasopharyngeal Swab     Status: None   Collection Time: 03/19/19 12:32 AM   Specimen: Nasopharyngeal Swab  Result Value Ref Range Status   SARS Coronavirus 2 by RT PCR NEGATIVE NEGATIVE Final    Comment: (NOTE) SARS-CoV-2 target nucleic acids are NOT DETECTED. The SARS-CoV-2 RNA is generally detectable in upper respiratoy specimens during the acute phase of infection. The lowest concentration of SARS-CoV-2 viral copies this assay can detect is 131 copies/mL. A negative result does not preclude SARS-Cov-2 infection and should not be used as the sole basis for treatment or other patient management decisions. A negative result may occur with  improper specimen collection/handling, submission of specimen other than nasopharyngeal swab, presence of viral mutation(s) within the areas targeted by this assay, and inadequate number of viral copies (<131 copies/mL). A negative result must be combined with clinical observations, patient history, and epidemiological information. The expected result is Negative. Fact Sheet for Patients:  PinkCheek.be Fact Sheet for Healthcare Providers:  GravelBags.it This test is not yet ap proved or cleared by the [place] FDA and  has been authorized for detection and/or diagnosis of SARS-CoV-2 by FDA under an Emergency Use Authorization (EUA). This EUA will remain  in effect (meaning this test can be used) for the duration of the COVID-19 declaration under Section 564(b)(1) of the Act, 21 U.S.C. section 360bbb-3(b)(1), unless the authorization is terminated or revoked sooner.    Influenza A by PCR NEGATIVE NEGATIVE  Final   Influenza B by PCR NEGATIVE NEGATIVE Final    Comment: (NOTE) The Xpert Xpress SARS-CoV-2/FLU/RSV assay is intended as an aid in  the diagnosis of influenza from Nasopharyngeal swab specimens and  should not be used as a sole basis for treatment. Nasal washings and  aspirates are unacceptable for Xpert Xpress SARS-CoV-2/FLU/RSV  testing. Fact Sheet for Patients: PinkCheek.be Fact Sheet for Healthcare Providers: GravelBags.it This test is not yet approved or cleared by the [place] FDA and  has been authorized for detection and/or diagnosis of SARS-CoV-2 by  FDA under an Emergency Use Authorization (EUA). This EUA will remain  in effect (meaning this test can be used) for the duration of the  Covid-19 declaration under Section 564(b)(1) of the Act, 21  U.S.C. section 360bbb-3(b)(1), unless the authorization is  terminated or revoked. Performed at [facility], [address] [place], [place] [address]   Blood Culture (routine x 2)     Status: Abnormal   Collection Time: 03/19/19 12:36 AM   Specimen: BLOOD  Result Value Ref Range Status   Specimen Description   Final    BLOOD LEFT ANTECUBITAL Performed at [facility], [address] [place], [place] [address]    Special Requests   Final    BOTTLES DRAWN AEROBIC AND ANAEROBIC Blood Culture adequate volume Performed at [facility], [address] [place], [place] [address]    Culture  Setup Time   Final    GRAM POSITIVE COCCI IN BOTH AEROBIC AND ANAEROBIC BOTTLES CRITICAL RESULT CALLED TO, READ BACK BY AND VERIFIED WITH: ALEX CHAPPELL 03/19/19 @ [address] [place]    Culture METHICILLIN RESISTANT STAPHYLOCOCCUS AUREUS (A)  Final   Report Status 03/21/2019 FINAL  Final   Organism  ID, Bacteria METHICILLIN RESISTANT STAPHYLOCOCCUS AUREUS  Final      Susceptibility   Methicillin resistant staphylococcus aureus - MIC*    CIPROFLOXACIN >=8  RESISTANT Resistant     ERYTHROMYCIN >=8 RESISTANT Resistant     GENTAMICIN <=0.5 SENSITIVE Sensitive     OXACILLIN >=4 RESISTANT Resistant     TETRACYCLINE <=1 SENSITIVE Sensitive     VANCOMYCIN <=0.5 SENSITIVE Sensitive     TRIMETH/SULFA <=10 SENSITIVE Sensitive     CLINDAMYCIN <=0.25 SENSITIVE Sensitive     RIFAMPIN <=0.5 SENSITIVE Sensitive     Inducible Clindamycin NEGATIVE Sensitive     * METHICILLIN RESISTANT STAPHYLOCOCCUS AUREUS  MRSA PCR Screening     Status: Abnormal   Collection Time: 03/19/19 12:25 PM   Specimen: Urine, Clean Catch; Nasopharyngeal  Result Value Ref Range Status   MRSA by PCR POSITIVE (A) NEGATIVE Final    Comment:        The GeneXpert MRSA Assay (FDA approved for NASAL specimens only), is one component of a comprehensive MRSA colonization surveillance program. It is not intended to diagnose MRSA infection nor to guide or monitor treatment for MRSA infections. RESULT CALLED TO, READ BACK BY AND VERIFIED WITH: DENISE [facility] AT 1914 03/19/19.PMF Performed at [facility], [address] [place], [place] [address]   CULTURE, BLOOD (ROUTINE X 2) w Reflex to ID Panel     Status: None (Preliminary result)   Collection Time: 03/20/19  5:40 PM   Specimen: BLOOD  Result Value Ref Range Status   Specimen Description BLOOD LEFT ANTECUBITAL  Final   Special Requests   Final    BOTTLES DRAWN AEROBIC AND ANAEROBIC Blood Culture adequate volume   Culture   Final    NO GROWTH 3 DAYS Performed at [facility], [address] [place], [place] [address]    Report Status PENDING  Incomplete  CULTURE, BLOOD (ROUTINE X 2) w Reflex to ID Panel     Status: None (Preliminary result)   Collection Time: 03/20/19  5:40 PM   Specimen: BLOOD  Result Value Ref Range Status   Specimen Description BLOOD BLOOD LEFT HAND  Final   Special Requests   Final    BOTTLES DRAWN AEROBIC AND ANAEROBIC Blood Culture adequate volume   Culture   Final    NO GROWTH  3 DAYS Performed at  [facility], [address], [place],  [address]    Report Status PENDING  Incomplete     LABS Lab Results  Component Value Date   CREATININE 0.99 03/23/2019   Lab Results  Component Value Date   WBC 5.2 03/23/2019   HGB 8.6 (L) 03/23/2019   HCT 26.6 (L) 03/23/2019   MCV 104.7 (H) 03/23/2019   PLT 90 (L) 03/23/2019   Lab Results  Component Value Date   ESRSEDRATE 75 (H) 03/21/2019   Lab Results  Component Value Date   CRP 21.2 (H) 03/20/2019    Allergies:  Allergies  Allergen Reactions  . Doxycycline Diarrhea and Nausea Only  . Methotrexate Rash    Discharge antibiotics Vancomycin      1000 mg  every     24          hours .     Goal vancomycin trough 15-20.    Pharmacy to adjust dosing based on levels  PICC Care per protocol Labs twice  weekly while on IV antibiotics -FAX weekly labs to [phone] CBC w diff   Comprehensive met panel Vancomycin  Trough   CRP   Planned duration of antibiotics 4 weeks from 3/15  Stop date April 10 Follow up clinic date First week April    Leonel Ramsay, MD

## 2019-03-23 NOTE — TOC Progression Note (Signed)
Transition of Care [facility]) - Progression Note    Patient Details  Name: Brooke Baker MRN: [id number] Date of Birth: 27-Jul-1934  Transition of Care [facility]) CM/SW [place], RN Phone Number: 03/23/2019, 10:09 AM  Clinical Narrative:     Left follow up voicemail for Tammy, Daughter.  Asking for previous [facility] agency.  Pt spoke with me and is concerned about patient's safety at home and reiterated patient needs short term rehab.  At this time patient is still refusing SNF.   Expected Discharge Plan: [place] Barriers to Discharge: Continued Medical Work up  Expected Discharge Plan and Services Expected Discharge Plan: [place] In-house Referral: Clinical Social Work Discharge Planning Services: CM Consult Post Acute Care Choice: [place] arrangements for the past 2 months: Single Family Home                 DME Arranged: N/A DME Agency: NA       HH Arranged: NA HH Agency: NA         Social Determinants of Health (SDOH) Interventions    Readmission Risk Interventions No flowsheet data found.

## 2019-03-23 NOTE — Plan of Care (Signed)
  Problem: Pain Managment: Goal: General experience of comfort will improve Outcome: Progressing   

## 2019-03-23 NOTE — Consult Note (Signed)
Pharmacy Antibiotic Note  Brooke Baker is a 85 y.o. female admitted on 03/18/2019 with sepsis. CXR impression of progression of lung disease versus superimposed pneumonia. Pharmacy has been consulted for vancomycin dosing. Patient is also on azithromycin. Patient was febrile to 103 on presentation which has resolved.   Tmax 24h 99.3F. No leukocytosis but patient with possible hypo proliferative process per hematology .   3/14 Blood cultures with 2/4 bottles (same set) MRSA. Repeat blood cultures 3/15 no growth x 2 days. Infectious diseases following. TEE without evidence of endocarditis. PICC placed 3/17.   Plan:  Increase to Vancomycin 1000 mg IV Q 24 hrs. Goal AUC 400-550. Expected AUC: 551.2, trough 17.2  Vancomycin peak: 22 Vancomycin trough: 15 (drawn 18 hr after last dose)  Scr improving (1.36 >> 1.09 >> 0.99). Continue to follow daily Scr while on vancomycin.   Patient will be on prolonged therapy for 4 weeks per ID due to presence of prosthetic joints.  Height: 5\' 6"  (167.6 cm) Weight: (low bed ) IBW/kg (Calculated) : 59.3  Temp (24hrs), Avg:98.4 F (36.9 C), Min:97.4 F (36.3 C), Max:99.3 F (37.4 C)  Recent Labs  Lab 03/19/19 0036 03/19/19 0036 03/19/19 0812 03/20/19 0450 03/21/19 0643 03/22/19 0837 03/22/19 2004 03/23/19 0500  WBC 5.4   < > 8.2 5.0 4.7 3.7*  --  5.2  CREATININE 1.12*   < > 1.26* 1.24* 1.36* 1.09*  --  0.99  LATICACIDVEN 1.7  --  1.7  --   --   --   --   --   VANCOPEAK  --   --   --   --   --   --  22*  --    < > = values in this interval not displayed.    Estimated Creatinine Clearance: 54.6 mL/min (by C-G formula based on SCr of 0.99 mg/dL).    Allergies  Allergen Reactions  . Doxycycline Diarrhea and Nausea Only  . Methotrexate Rash    Antimicrobials this admission: Cefepime 3/14 x1 Vancomycin 3/14 >>  Ceftriaxone 3/14 >> 3/15 Azithromycin 3/14 >> 3/18  Dose adjustments this admission: n/a  Microbiology results: 3/15 BCx:  No growth x 3 days 3/14 BCx: 2/2 bottles MRSA 3/14 MRSA PCR: positive  Thank you for allowing pharmacy to be a part of this patient's care.  [place] Resident 03/23/2019 8:00 AM

## 2019-03-23 NOTE — Progress Notes (Addendum)
PROGRESS NOTE    Brooke Baker  [id number] DOB: January 28, 1934 DOA: 03/18/2019 PCP: Einar Pheasant, MD   Brief Narrative:  [name], type diabetes mellitus, hypertension, dyslipidemia and coronary artery disease, who presented to the emergency room with acute onset of fever with associated generalized weakness and dyspnea without significant cough and wheezing. She has been having orthopnea and paroxysmal nocturnal dyspnea as well as worsening lower extremity edema. She admits to dyspnea on exertion. She admitted to nausea and vomiting without abdominal pain.  On presentation febrile, chest x-ray with bilateral interstitial densities concerning for disease progression versus superimposed pneumonia. She was started on ceftriaxone and azithromycin. Blood cultures are growing staph aureus.  Antibiotics switched with vancomycin.  03/22/19 Patient was feeling little better when seen today.  No new complaints. Labs shows anemia with hemoglobin of 7.0 and  Her pradaxa has been held for her chronic a.fib.  Oncology is on board as is GI and cardiology. Pt is awake,alert and cooperative and oriented Denies any complaints of chest pain or sob.  03/23/19: Pt seen today and is alert and awake and denies any complaints. Ros is o/w negative. Hb is 8.6 post 2 unit prbc.  Ct imaging pending.  Assessment & Plan:   Active Problems:   AF (paroxysmal atrial fibrillation) (HCC)   Macrocytic anemia   Chronic respiratory failure ([place])   Sepsis ([place])   Community acquired pneumonia  Sepsis secondary to MRSA bacteremia and bilateral pneumonia.  TTE and TEE was negative for vegetations.  Patient has hardware due to right hip and knee replacement and high risk for seeding.  Blood culture drawn on 03/20/19 remain negative so far.  ID was consulted-recommending at least 4-week of  vancomycin. -PICC line will be placed today -ID is also recommending CT spine before discharge to rule out any osteomyelitis or discitis. -PT/OT are recommending SNF placement but patient wants to go home with home health services. -Continue supportive care with DuoNeb.  Acute on chronic diastolic CHF, possibly cor pulmonale.  She has a history of preserved EF of 55% with NYHA class II- III ACC/AHA stage C. Patient is on 2 L at home. She was initially diuresed with Lasix 40 mg twice daily, which was discontinued due to increased in creatinine. -Start her on p.o. Lasix 40 mg daily from tomorrow. -Continue to monitor. -Continue daily BMP, strict intake and output and daily weight. -Echocardiogram-with normal EF and no wall motion abnormalities.  Paroxysmal atrial fibrillation.  Currently regular and rate controlled. -Continue Toprol-XL and Pradaxa held for anemia. -Cardiology is following and we appreciate their recommendations.  Hypertension.  Blood pressure within goal. -Continue home antihypertensives.  Macrocytic anemia.  Hemoglobin stable.  Patient follow-up with hematology.  She has rheumatoid factor positive psoriatic arthritis.  Labs were consistent with hypoproliferative bone marrow.  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  They were planning to do a bone marrow evaluation if hemoglobin continued to drop, the time of prior hematology visit her hemoglobin was 9.7.   Hemoglobin dropped to 7.2 today without any obvious bleeding.  After discussing with her hematologist's we will transfuse her with 1 unit of PRBC. -2 unit packed RBCs ordered. -Continue to monitor. -zoloft also causes anemia and bleeding.  Pancytopenia/Thrombocytopenia.  Worsening thrombocytopenia with platelet of 71.  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  Patient is also on Pradaxa  which increased her chances of bleeding. Discussed with Dr.  Janese Banks who saw the patient and recommending continuation of Pradaxa at this time.  Pradaxa need to be discontinued if platelets drop below 50,000. -We will discontinue pradaxa due to anemia -Continue to monitor platelets. -Monitor for any sign of bleeding. -suspect pancytopenia could also be due to combination of antiseizure meds.    History of seizure disorder.  No acute concern. -We will continue home antiseizure meds.  Dyslipidemia. -Continue statin.  Depression. -Continue Zoloft.  Objective: Vitals:   03/23/19 0838 03/23/19 1023 03/23/19 1138 03/23/19 1603  BP: 108/73 127/62 124/63 (!) 127/59  Pulse: 87 91 94 84  Resp: 20  18 18   Temp: 97.9 F (36.6 C)  97.9 F (36.6 C) 98.2 F (36.8 C)  TempSrc: Oral  Oral Oral  SpO2: 100%  99% 100%  Weight:      Height:        Intake/Output Summary (Last 24 hours) at 03/23/2019 1719 Last data filed at 03/23/2019 1715 Gross per 24 hour  Intake 0 ml  Output 900 ml  Net -900 ml   Filed Weights   03/18/19 2317 03/21/19 0547  Weight: 73.5 kg 108.2 kg    Examination:  General exam: Appears calm and comfortable  Respiratory system: Clear to auscultation. Respiratory effort normal. Cardiovascular system: S1 & S2 heard, RRR. No JVD, murmurs, rubs, gallops or clicks. Gastrointestinal system: Soft, nontender, nondistended, bowel sounds positive. Central nervous system: Alert and oriented. No focal neurological deficits.Symmetric 5 x 5 power. Extremities: 1+LE edema with signs of chronic venous dermatitis, 2+ upper extremity edema, worse on left with ecchymosis,pulses intact and symmetrical. Psychiatry: Judgement and insight appear normal. Mood & affect appropriate.   Consultants:   Hematology  Cardiology  ID  Antimicrobials:  Vancomycin  Data Reviewed: I have personally reviewed following labs and imaging studies  CBC: Recent Labs  Lab 03/19/19 0036 03/19/19 0036 03/19/19 [id number] 03/19/19 [id number] 03/20/19 0450 03/20/19 0450  03/21/19 0643 03/21/19 2220 03/22/19 0837 03/22/19 2004 03/23/19 0500  WBC 5.4   < > 8.2  --  5.0  --  4.7  --  3.7*  --  5.2  NEUTROABS 4.6  --   --   --   --   --  3.2  --  2.3  --  2.9  HGB 8.8*   < > 7.7*   < > 8.1*   < > 7.2* 7.6* 7.0* 8.4* 8.6*  HCT 28.2*   < > 24.1*   < > 25.3*  --  22.5*  --  21.7* 26.8* 26.6*  MCV 109.3*   < > 109.5*  --  107.2*  --  109.2*  --  108.5*  --  104.7*  PLT 83*   < > 72*  --  68*  --  71*  --  71*  --  90*   < > = values in this interval not displayed.   Basic Metabolic Panel: Recent Labs  Lab 03/19/19 0812 03/20/19 0450 03/21/19 0643 03/22/19 0837 03/23/19 0500  NA 134* 133* 136 136 135  K 4.3 4.4 3.7 3.6 3.5  CL 109 105 107 109 107  CO2 22 20* 23 20* 25  GLUCOSE 139* 117* 95 92 90  BUN 22 26* 32* 29* 24*  CREATININE 1.26* 1.24* 1.36* 1.09* 0.99  CALCIUM 7.1* 7.4* 7.5* 7.5* 7.4*   GFR: Estimated Creatinine Clearance: 54.6 mL/min (by C-G formula based on SCr of 0.99 mg/dL). Liver Function Tests: Recent  Labs  Lab 03/19/19 0036  AST 31  ALT 10  ALKPHOS 108  BILITOT 1.5*  PROT 7.4  ALBUMIN 2.4*   Recent Labs  Lab 03/19/19 0036  LIPASE 21   No results for input(s): AMMONIA in the last 168 hours. Coagulation Profile: Recent Labs  Lab 03/19/19 0036 03/21/19 0643  INR 1.7* 2.1*   Cardiac Enzymes: No results for input(s): CKTOTAL, CKMB, CKMBINDEX, TROPONINI in the last 168 hours. BNP (last 3 results) No results for input(s): PROBNP in the last 8760 hours. HbA1C: No results for input(s): HGBA1C in the last 72 hours. CBG: Recent Labs  Lab 03/22/19 1708 03/22/19 2120 03/23/19 0834 03/23/19 1136 03/23/19 1642  GLUCAP 116* 131* 90 92 124*   Lipid Profile: No results for input(s): CHOL, HDL, LDLCALC, TRIG, CHOLHDL, LDLDIRECT in the last 72 hours. Thyroid Function Tests: No results for input(s): TSH, T4TOTAL, FREET4, T3FREE, THYROIDAB in the last 72 hours. Anemia Panel: No results for input(s): VITAMINB12, FOLATE,  FERRITIN, TIBC, IRON, RETICCTPCT in the last 72 hours. Sepsis Labs: Recent Labs  Lab 03/19/19 0036 03/19/19 0812  PROCALCITON 0.44  --   LATICACIDVEN 1.7 1.7    Recent Results (from the past 240 hour(s))  Respiratory Panel by RT PCR (Flu A&B, Covid) - Nasopharyngeal Swab     Status: None   Collection Time: 03/19/19 12:32 AM   Specimen: Nasopharyngeal Swab  Result Value Ref Range Status   SARS Coronavirus 2 by RT PCR NEGATIVE NEGATIVE Final    Comment: (NOTE) SARS-CoV-2 target nucleic acids are NOT DETECTED. The SARS-CoV-2 RNA is generally detectable in upper respiratoy specimens during the acute phase of infection. The lowest concentration of SARS-CoV-2 viral copies this assay can detect is 131 copies/mL. A negative result does not preclude SARS-Cov-2 infection and should not be used as the sole basis for treatment or other patient management decisions. A negative result may occur with  improper specimen collection/handling, submission of specimen other than nasopharyngeal swab, presence of viral mutation(s) within the areas targeted by this assay, and inadequate number of viral copies (<131 copies/mL). A negative result must be combined with clinical observations, patient history, and epidemiological information. The expected result is Negative. Fact Sheet for Patients:  PinkCheek.be Fact Sheet for Healthcare Providers:  GravelBags.it This test is not yet ap proved or cleared by the [place] FDA and  has been authorized for detection and/or diagnosis of SARS-CoV-2 by FDA under an Emergency Use Authorization (EUA). This EUA will remain  in effect (meaning this test can be used) for the duration of the COVID-19 declaration under Section 564(b)(1) of the Act, 21 U.S.C. section 360bbb-3(b)(1), unless the authorization is terminated or revoked sooner.    Influenza A by PCR NEGATIVE NEGATIVE Final   Influenza B by PCR  NEGATIVE NEGATIVE Final    Comment: (NOTE) The Xpert Xpress SARS-CoV-2/FLU/RSV assay is intended as an aid in  the diagnosis of influenza from Nasopharyngeal swab specimens and  should not be used as a sole basis for treatment. Nasal washings and  aspirates are unacceptable for Xpert Xpress SARS-CoV-2/FLU/RSV  testing. Fact Sheet for Patients: PinkCheek.be Fact Sheet for Healthcare Providers: GravelBags.it This test is not yet approved or cleared by the [place] FDA and  has been authorized for detection and/or diagnosis of SARS-CoV-2 by  FDA under an Emergency Use Authorization (EUA). This EUA will remain  in effect (meaning this test can be used) for the duration of the  Covid-19 declaration under Section 564(b)(1) of the  Act, 21  U.S.C. section 360bbb-3(b)(1), unless the authorization is  terminated or revoked. Performed at [facility] : [facility], [address] [place], [place] [address]   Blood Culture (routine x 2)     Status: Abnormal   Collection Time: 03/19/19 12:36 AM   Specimen: BLOOD  Result Value Ref Range Status   Specimen Description   Final    BLOOD LEFT ANTECUBITAL Performed at [facility], [address], [place], [place] [address]    Special Requests   Final    BOTTLES DRAWN AEROBIC AND ANAEROBIC Blood Culture adequate volume Performed at [facility], [place], [place] [address]    Culture  Setup Time   Final    GRAM POSITIVE COCCI IN BOTH AEROBIC AND ANAEROBIC BOTTLES CRITICAL RESULT CALLED TO, READ BACK BY AND VERIFIED WITH: ALEX CHAPPELL 03/19/19 @ [address] [place]    Culture METHICILLIN RESISTANT STAPHYLOCOCCUS AUREUS (A)  Final   Report Status 03/21/2019 FINAL  Final   Organism ID, Bacteria METHICILLIN RESISTANT STAPHYLOCOCCUS AUREUS  Final      Susceptibility   Methicillin resistant staphylococcus aureus - MIC*    CIPROFLOXACIN >=8 RESISTANT Resistant      ERYTHROMYCIN >=8 RESISTANT Resistant     GENTAMICIN <=0.5 SENSITIVE Sensitive     OXACILLIN >=4 RESISTANT Resistant     TETRACYCLINE <=1 SENSITIVE Sensitive     VANCOMYCIN <=0.5 SENSITIVE Sensitive     TRIMETH/SULFA <=10 SENSITIVE Sensitive     CLINDAMYCIN <=0.25 SENSITIVE Sensitive     RIFAMPIN <=0.5 SENSITIVE Sensitive     Inducible Clindamycin NEGATIVE Sensitive     * METHICILLIN RESISTANT STAPHYLOCOCCUS AUREUS  MRSA PCR Screening     Status: Abnormal   Collection Time: 03/19/19 12:25 PM   Specimen: Urine, Clean Catch; Nasopharyngeal  Result Value Ref Range Status   MRSA by PCR POSITIVE (A) NEGATIVE Final    Comment:        The GeneXpert MRSA Assay (FDA approved for NASAL specimens only), is one component of a comprehensive MRSA colonization surveillance program. It is not intended to diagnose MRSA infection nor to guide or monitor treatment for MRSA infections. RESULT CALLED TO, READ BACK BY AND VERIFIED WITH: DENISE [facility] AT [id number] 03/19/19.PMF Performed at [facility], [address] [place], [place] [address]   CULTURE, BLOOD (ROUTINE X 2) w Reflex to ID Panel     Status: None (Preliminary result)   Collection Time: 03/20/19  5:40 PM   Specimen: BLOOD  Result Value Ref Range Status   Specimen Description BLOOD LEFT ANTECUBITAL  Final   Special Requests   Final    BOTTLES DRAWN AEROBIC AND ANAEROBIC Blood Culture adequate volume   Culture   Final    NO GROWTH 3 DAYS Performed at [facility], [address] [place], [place] [address]    Report Status PENDING  Incomplete  CULTURE, BLOOD (ROUTINE X 2) w Reflex to ID Panel     Status: None (Preliminary result)   Collection Time: 03/20/19  5:40 PM   Specimen: BLOOD  Result Value Ref Range Status   Specimen Description BLOOD BLOOD LEFT HAND  Final   Special Requests   Final    BOTTLES DRAWN AEROBIC AND ANAEROBIC Blood Culture adequate volume   Culture   Final    NO GROWTH 3 DAYS Performed at  [facility], [address] [place], [place] [address]    Report Status PENDING  Incomplete  Radiology Studies: DG Chest Port 1 View  Result Date: 03/22/2019 CLINICAL DATA:  PICC placement EXAM: PORTABLE CHEST 1 VIEW COMPARISON:  03/18/2019 chest radiograph. FINDINGS: Left PICC terminates at the cavoatrial junction. Stable cardiomediastinal silhouette with mild cardiomegaly. No pneumothorax. Small bilateral pleural effusions, stable. Patchy hazy and linear interstitial opacities throughout both lungs, similar. IMPRESSION: 1. Left PICC terminates at the cavoatrial junction. 2. Stable small bilateral pleural effusions. 3. Mild cardiomegaly. Patchy hazy and linear interstitial opacities throughout both lungs, similar, either pulmonary edema or atypical pneumonia. Electronically Signed   By: Ilona Sorrel M.D.   On: 03/22/2019 18:17   [place] EKG SITE RITE  Result Date: 03/22/2019 If Site Rite image not attached, placement could not be confirmed due to current cardiac rhythm.   Scheduled Meds: . aspirin EC  81 mg Oral Daily  . Chlorhexidine Gluconate Cloth  6 each Topical Daily  . fluocinonide ointment   Topical BID  . fluticasone  2 spray Each Nare Daily  . folic acid  1 mg Oral Daily  . furosemide  40 mg Oral Daily  . gabapentin  100 mg Oral BID  . insulin aspart  0-9 Units Subcutaneous TID PC & HS  . levETIRAcetam  750 mg Oral BID  . metoprolol succinate  50 mg Oral Daily  . mupirocin ointment   Topical BID  . nystatin-triamcinolone   Topical BID  . omega-3 acid ethyl esters  1 g Oral Daily  . phenytoin  400 mg Oral QHS  . polyethylene glycol  17 g Oral Daily  . rosuvastatin  20 mg Oral Daily  . sertraline  75 mg Oral Daily  . sodium chloride flush  10-40 mL Intracatheter Q12H  . sodium chloride flush  10-40 mL Intracatheter Q12H  . triamcinolone ointment   Topical TID   Continuous Infusions: . vancomycin 1,000 mg (03/23/19 1715)     LOS: 4 days  Disposition: Expect  d/c in next 48 hours if pt is clinically  Ready and imaging is pending. Time spent: 45 minutes.  Para Skeans, MD [facility]  If 7PM-7AM, please contact night-coverage [email or web address]  03/23/2019, 5:19 PM   This record has been created using Systems analyst. Errors have been sought and corrected,but may not always be located. Such creation errors do not reflect on the standard of care.

## 2019-03-23 NOTE — Care Management Important Message (Signed)
Important Message  Patient Details  Name: CELSEY NAKAMOTO MRN: [id number] Date of Birth: 1934-09-02   Medicare Important Message Given:  Other (see comment)  Attempted to review Medicare IM with patient via room phone due to isolation status, however no answer.  Will attempt at later time.    Dannette Barbara 03/23/2019, 1:26 PM

## 2019-03-23 NOTE — Progress Notes (Deleted)
Virtual Visit via Video Note  I connected with Brooke Baker on 03/23/19 at  3:15 PM EDT by a video enabled telemedicine application and verified that I am speaking with the correct person using two identifiers.  Location: Patient: *** Provider: ***   I discussed the limitations of evaluation and management by telemedicine and the availability of in person appointments. The patient expressed understanding and agreed to proceed.  History of Present Illness: 03/27/2019 SS: Brooke Baker is a 85 year old female with history of seizures.  She has not been seen at this office since March 2019.  Her last seizure occurred in 2004.  She is on Dilantin and Keppra.  03/25/2017 CM HISTORY OF PRESENT ILLNESS:Brooke Baker is a 85 year old right-handed white female with a history of seizures. The patient has not had any seizures in a number of years,  Last  2004. The patient indicates that since she went on a combination of Dilantin and Keppra, the seizures have essentially disappeared. The patient fractured her femur last year and still requires a walker/cane for ambulation. She has not had any further falls. She is on a combination of calcium and vitamin D supplementation currently. She is tolerating the medications fairly well, and she recently had blood work done in [place],  [place] and heapatic function through her primary care physician, but a dilantin level was not included.  She is no longer driving.  She remains independent in activities of daily living.  She returns for an evaluation at this time. She needs refills on her medications.   Observations/Objective:   Assessment and Plan:   Follow Up Instructions:    I discussed the assessment and treatment plan with the patient. The patient was provided an opportunity to ask questions and all were answered. The patient agreed with the plan and demonstrated an understanding of the instructions.   The patient was advised to call back or seek an  in-person evaluation if the symptoms worsen or if the condition fails to improve as anticipated.  I provided *** minutes of non-face-to-face time during this encounter.   Suzzanne Cloud, NP

## 2019-03-23 NOTE — Progress Notes (Signed)
Physical Therapy Treatment Patient Details Name: Brooke Baker MRN: [id number] DOB: 26-Jun-1934 Today's Date: 03/23/2019    History of Present Illness Brooke Baker  is a 85 y.o. Caucasian female with a known history of multiple medical problems that are mentioned below including COPD, type diabetes mellitus, hypertension, dyslipidemia and coronary artery disease, who presented to the emergency room with acute onset of fever with associated generalized weakness and dyspnea without significant cough and wheezing. Found to be septic with progressive lung disease with pneumonia. Patient on chronic O2 3-4L, is ind with ADLs with RW.  Patient recently with downtrending HCT and Hgb recieving blood transfusion on 03/21/19 and 03/22/19.    PT Comments    Pt was supine in bed upon arriving with RN in room issuing meds. Pt is Alert and very pleasant however slightly disoriented. She was able to follow commands consistently throughout session. She was on 2 L o2 throughout session with sao2 drop from 98% to 90 %. HR at rest 97 bpm that elevated to 133 bpm during ambulation to BR. She was able to get out of R side of bed with increased time and mod assist. Max assist to return to supine from sitting EOB after performing OOB activity. She stood with min assist and ambulated to BR with CGA for safety. Pt with very slow gait and poor gait posture however no LOB noted. Pt had BM. HR decreased from 133bpm to 105bpm after only 1 minute seated. She return to bed after BM. Pt very fatigued form minimal activity. Therapist discussed recommendation of D/c to SNF. Pt continues to want to D/C to home. Therapist will discuss with family and will continue to follow pt per POC.    Follow Up Recommendations  SNF     Equipment Recommendations  Rolling walker with 5" wheels    Recommendations for Other Services       Precautions / Restrictions Precautions Precautions: Fall Restrictions Weight Bearing Restrictions: No     Mobility  Bed Mobility Overal bed mobility: Needs Assistance Bed Mobility: Supine to Sit     Supine to sit: Mod assist Sit to supine: Max assist   General bed mobility comments: Pt was able to exit R sid eof bed with increased time, use of bedrails and mod assist.   Transfers Overall transfer level: Needs assistance Equipment used: Rolling walker (2 wheeled) Transfers: Sit to/from Stand Sit to Stand: Min assist         General transfer comment: from elevated bed height, pt was able to STS with min assist + vcs for proper handplacement and technique.  Ambulation/Gait Ambulation/Gait assistance: Min guard Gait Distance (Feet): 25 Feet Assistive device: Rolling walker (2 wheeled) Gait Pattern/deviations: Step-through pattern Gait velocity: decreased   General Gait Details: pt was able to ambulate from R side of bed to BR ~ 25 ft with gait belt + RW. CGA for safety throughout. slow cadence and poor gait posture however no LOB. Pt HR elevated form 102 to 133bpm but quickly resolves to 106 when seated on toilet. sao2 > 90 % thorughout on 2 L   Stairs             Wheelchair Mobility    Modified Rankin (Stroke Patients Only)       Balance Overall balance assessment: Needs assistance;History of Falls Sitting-balance support: Feet supported;No upper extremity supported Sitting balance-Leahy Scale: Good Sitting balance - Comments: No LOB seated EOB   Standing balance support: During functional activity Standing balance-Leahy Scale:  Fair Standing balance comment: no LOB with BUE support on RW. poor balance without BUE support                            Cognition Arousal/Alertness: Awake/alert Behavior During Therapy: WFL for tasks assessed/performed Overall Cognitive Status: Within Functional Limits for tasks assessed                                 General Comments: Pt is A and Oriented x 2. unable to recall proper month or day of  week.       Exercises      General Comments        Pertinent Vitals/Pain Pain Assessment: No/denies pain    Home Living                      Prior Function            PT Goals (current goals can now be found in the care plan section) Acute Rehab PT Goals Patient Stated Goal: " I want to get better so I can go home." Progress towards PT goals: Progressing toward goals    Frequency    Min 2X/week      PT Plan Current plan remains appropriate    Co-evaluation              AM-PAC PT "6 Clicks" Mobility   Outcome Measure  Help needed turning from your back to your side while in a flat bed without using bedrails?: A Little Help needed moving from lying on your back to sitting on the side of a flat bed without using bedrails?: A Lot Help needed moving to and from a bed to a chair (including a wheelchair)?: A Little Help needed standing up from a chair using your arms (e.g., wheelchair or bedside chair)?: A Little Help needed to walk in hospital room?: A Lot Help needed climbing 3-5 steps with a railing? : A Lot 6 Click Score: 15    End of Session Equipment Utilized During Treatment: Gait belt;Oxygen Activity Tolerance: Patient tolerated treatment well Patient left: in bed;with call bell/phone within reach;with bed alarm set;with family/visitor present Nurse Communication: Mobility status PT Visit Diagnosis: Unsteadiness on feet (R26.81);Muscle weakness (generalized) (M62.81);Other abnormalities of gait and mobility (R26.89);History of falling (Z91.81);Difficulty in walking, not elsewhere classified (R26.2)     Time: [id number] PT Time Calculation (min) (ACUTE ONLY): 40 min  Charges:  $Gait Training: 8-22 mins $Therapeutic Activity: 23-37 mins                     Julaine Fusi PTA 03/23/19, 10:10 AM

## 2019-03-23 NOTE — Progress Notes (Signed)
Occupational Therapy Treatment Patient Details Name: Brooke Baker MRN: [id number] DOB: 01-17-1934 Today's Date: 03/23/2019    History of present illness Brooke Baker  is a 85 y.o. Caucasian female with a known history of multiple medical problems that are mentioned below including COPD, type diabetes mellitus, hypertension, dyslipidemia and coronary artery disease, who presented to the emergency room with acute onset of fever with associated generalized weakness and dyspnea without significant cough and wheezing. Found to be septic with progressive lung disease with pneumonia. Patient on chronic O2 3-4L, is ind with ADLs with RW.  Patient recently with downtrending HCT and Hgb recieving blood transfusion on 03/21/19 and 03/22/19.   OT comments  Patient supine in bed upon entry and agreeable to therapy.  Patient appears very lethargic and states she was able to walk earlier today to restroom and feels very fatigued.  Per chart, Hgb improved to 8.6 and HCT to 26.6.  Patient agreeable for simple grooming at bedside.  Able to perform with set up/SPV and extra time.  Noted patient becoming very SOB with simple activity such as brushing hair.  02 remained at 99-100% on 4L supplemental 02.  HR between 84-87.  Patient with no complaints of pain.  Patient would benefit from continued occupational therapy services to address performance component deficits outlined in evaluation.  Based on today's performance, continuing to recommend SNF.   Follow Up Recommendations  SNF    Equipment Recommendations  Other (comment)(defer to next level of care)    Recommendations for Other Services      Precautions / Restrictions Precautions Precautions: Fall;Other (comment)(Contact precautions (MRSA)) Restrictions Weight Bearing Restrictions: No       Mobility Bed Mobility               General bed mobility comments: Assisted patient with positioning B UEs under pillows for improved mobility and to  reduce edema  Transfers                      Balance                                           ADL either performed or assessed with clinical judgement   ADL Overall ADL's : Needs assistance/impaired     Grooming: Wash/dry hands;Wash/dry face;Brushing hair;Bed level;Set up;Supervision/safety                                 General ADL Comments: Patient very easily SOB this date with minimal activity.     Vision Patient Visual Report: No change from baseline     Perception     Praxis      Cognition Arousal/Alertness: Awake/alert Behavior During Therapy: WFL for tasks assessed/performed Overall Cognitive Status: Within Functional Limits for tasks assessed                                          Exercises Other Exercises Other Exercises: Patient participated in simple grooming tasks at bed level with set up/SPV and extra time d/t weakness and SOB   Shoulder Instructions       General Comments      Pertinent Vitals/ Pain       Pain  Assessment: No/denies pain  Home Living                                          Prior Functioning/Environment              Frequency  Min 1X/week        Progress Toward Goals  OT Goals(current goals can now be found in the care plan section)  Progress towards OT goals: Progressing toward goals     Plan Discharge plan remains appropriate;Frequency remains appropriate    Co-evaluation                 AM-PAC OT "6 Clicks" Daily Activity     Outcome Measure                    End of Session    OT Visit Diagnosis: Muscle weakness (generalized) (M62.81);History of falling (Z91.81)   Activity Tolerance Patient limited by fatigue   Patient Left in bed;with call bell/phone within reach;with bed alarm set   Nurse Communication          Time: [id number] OT Time Calculation (min): 15 min  Charges: OT General Charges $OT  Visit: 1 Visit OT Treatments $Self Care/Home Management : 8-22 mins  Baldomero [name], MS, OTR/L 03/23/19, 3:48 PM

## 2019-03-24 ENCOUNTER — Other Ambulatory Visit: Payer: Self-pay

## 2019-03-24 DIAGNOSIS — D539 Nutritional anemia, unspecified: Secondary | ICD-10-CM

## 2019-03-24 LAB — CBC WITH DIFFERENTIAL/PLATELET
Abs Immature Granulocytes: 0.03 10*3/uL (ref 0.00–0.07)
Basophils Absolute: 0 10*3/uL (ref 0.0–0.1)
Basophils Relative: 1 %
Eosinophils Absolute: 0.7 10*3/uL — ABNORMAL HIGH (ref 0.0–0.5)
Eosinophils Relative: 14 %
HCT: 26.4 % — ABNORMAL LOW (ref 36.0–46.0)
Hemoglobin: 8.5 g/dL — ABNORMAL LOW (ref 12.0–15.0)
Immature Granulocytes: 1 %
Lymphocytes Relative: 14 %
Lymphs Abs: 0.8 10*3/uL (ref 0.7–4.0)
MCH: 33.9 pg (ref 26.0–34.0)
MCHC: 32.2 g/dL (ref 30.0–36.0)
MCV: 105.2 fL — ABNORMAL HIGH (ref 80.0–100.0)
Monocytes Absolute: 0.8 10*3/uL (ref 0.1–1.0)
Monocytes Relative: 16 %
Neutro Abs: 2.9 10*3/uL (ref 1.7–7.7)
Neutrophils Relative %: 54 %
Platelets: 114 10*3/uL — ABNORMAL LOW (ref 150–400)
RBC: 2.51 MIL/uL — ABNORMAL LOW (ref 3.87–5.11)
RDW: 15.6 % — ABNORMAL HIGH (ref 11.5–15.5)
WBC: 5.2 10*3/uL (ref 4.0–10.5)
nRBC: 0 % (ref 0.0–0.2)

## 2019-03-24 LAB — PHOSPHORUS: Phosphorus: 1.8 mg/dL — ABNORMAL LOW (ref 2.5–4.6)

## 2019-03-24 LAB — GLUCOSE, CAPILLARY
Glucose-Capillary: 94 mg/dL (ref 70–99)
Glucose-Capillary: 101 mg/dL — ABNORMAL HIGH (ref 70–99)
Glucose-Capillary: 91 mg/dL (ref 70–99)
Glucose-Capillary: 117 mg/dL — ABNORMAL HIGH (ref 70–99)

## 2019-03-24 LAB — COMPREHENSIVE METABOLIC PANEL
ALT: 12 U/L (ref 0–44)
AST: 39 U/L (ref 15–41)
Albumin: 1.9 g/dL — ABNORMAL LOW (ref 3.5–5.0)
Alkaline Phosphatase: 82 U/L (ref 38–126)
Anion gap: 6 (ref 5–15)
BUN: 21 mg/dL (ref 8–23)
CO2: 25 mmol/L (ref 22–32)
Calcium: 7.4 mg/dL — ABNORMAL LOW (ref 8.9–10.3)
Chloride: 105 mmol/L (ref 98–111)
Creatinine, Ser: 0.9 mg/dL (ref 0.44–1.00)
GFR calc Af Amer: >60 mL/min (ref >60)
GFR calc non Af Amer: 60 mL/min — ABNORMAL LOW (ref >60)
Glucose, Bld: 96 mg/dL (ref 70–99)
Potassium: 3.9 mmol/L (ref 3.5–5.1)
Sodium: 136 mmol/L (ref 135–145)
Total Bilirubin: 1.3 mg/dL — ABNORMAL HIGH (ref 0.3–1.2)
Total Protein: 6.1 g/dL — ABNORMAL LOW (ref 6.5–8.1)

## 2019-03-24 LAB — CREATININE, SERUM
Creatinine, Ser: 0.84 mg/dL (ref 0.44–1.00)
GFR calc Af Amer: >60 mL/min (ref >60)
GFR calc non Af Amer: >60 mL/min (ref >60)

## 2019-03-24 LAB — MAGNESIUM: Magnesium: 1.2 mg/dL — ABNORMAL LOW (ref 1.7–2.4)

## 2019-03-24 LAB — POTASSIUM: Potassium: 4.1 mmol/L (ref 3.5–5.1)

## 2019-03-24 MED ORDER — POTASSIUM CHLORIDE CRYS ER 20 MEQ PO TBCR
40.0000 meq | EXTENDED_RELEASE_TABLET | Freq: Once | ORAL | Status: AC
  Administered 2019-03-24: 40 meq via ORAL
  Filled 2019-03-24: qty 2

## 2019-03-24 MED ORDER — DABIGATRAN ETEXILATE MESYLATE 150 MG PO CAPS
150.0000 mg | ORAL_CAPSULE | Freq: Two times a day (BID) | ORAL | Status: DC
  Administered 2019-03-24 – 2019-03-29 (×11): 150 mg via ORAL
  Filled 2019-03-24 (×12): qty 1

## 2019-03-24 MED ORDER — VANCOMYCIN IV (FOR PTA / DISCHARGE USE ONLY): 1000.0000 mg | INTRAVENOUS | 0 refills | Status: DC

## 2019-03-24 MED ORDER — ALBUTEROL SULFATE (2.5 MG/3ML) 0.083% IN NEBU
2.5000 mg | INHALATION_SOLUTION | Freq: Four times a day (QID) | RESPIRATORY_TRACT | Status: DC | PRN
  Administered 2019-03-24 – 2019-03-25 (×2): 2.5 mg via RESPIRATORY_TRACT
  Filled 2019-03-24 (×2): qty 3

## 2019-03-24 MED ORDER — MAGNESIUM SULFATE 50 % IJ SOLN: 2.0000 g | Freq: Once | INTRAMUSCULAR | Status: DC

## 2019-03-24 MED ORDER — MAGNESIUM SULFATE 2 GM/50ML IV SOLN
2.0000 g | Freq: Once | INTRAVENOUS | Status: AC
  Administered 2019-03-24: 2 g via INTRAVENOUS
  Filled 2019-03-24: qty 50

## 2019-03-24 NOTE — Care Management Important Message (Signed)
Important Message  Patient Details  Name: ZENJA HASSLER MRN: [id number] Date of Birth: 1934-08-28   Medicare Important Message Given:  Other (see comment)  Attempted to reach patient via room phone due to isolation to review Medicare IM.  No answer on room phone.  Will attempt at a later time.    Dannette Barbara 03/24/2019, 2:11 PM

## 2019-03-24 NOTE — Progress Notes (Signed)
Patient Name: Brooke Baker Date of Encounter: 03/24/2019  Hospital Problem List     Active Problems:   AF (paroxysmal atrial fibrillation) (HCC)   Macrocytic anemia   Chronic respiratory failure ([place])   Sepsis ([place])   Community acquired pneumonia    Patient Profile     86 y.o.femalewith history ofcoronary artery disease status post PCI of the proximal LAD in 2003, status post PCI of an OM1 in 2006, history of paroxysmal atrial fibrillation treated with anticoagulation with Pradaxa, hypertension, hyperlipidemia, history of TIAs, history of diastolic congestive heart failure [place] heart association class II-III ACC/AHA stage C, HFpEF with an EF of 55%. Echocardiogram in 2018 showed an EF of 50 to 55% with moderate TR mild MR and pulmonary regurgitation with mild pulmonary hypertension. This was unchanged from 2012. She presented to emergency room complaining of shortness of breath.   Subjective   Somewhat somnolent.  Lying flat in bed without shortness of breath.  Inpatient Medications    . aspirin EC  81 mg Oral Daily  . Chlorhexidine Gluconate Cloth  6 each Topical Daily  . dabigatran  150 mg Oral Q12H  . fluocinonide ointment   Topical BID  . fluticasone  2 spray Each Nare Daily  . folic acid  1 mg Oral Daily  . furosemide  40 mg Oral Daily  . gabapentin  100 mg Oral BID  . insulin aspart  0-9 Units Subcutaneous TID PC & HS  . levETIRAcetam  750 mg Oral BID  . metoprolol succinate  50 mg Oral Daily  . mupirocin ointment   Topical BID  . nystatin-triamcinolone   Topical BID  . omega-3 acid ethyl esters  1 g Oral Daily  . phenytoin  400 mg Oral QHS  . polyethylene glycol  17 g Oral Daily  . potassium chloride  40 mEq Oral Once  . rosuvastatin  20 mg Oral Daily  . sertraline  75 mg Oral Daily  . sodium chloride flush  10-40 mL Intracatheter Q12H  . sodium chloride flush  10-40 mL Intracatheter Q12H  . triamcinolone ointment   Topical TID    Vital Signs     Vitals:   03/23/19 1603 03/23/19 1959 03/23/19 2000 03/24/19 0525  BP: (!) 127/59 131/61  (!) 118/52  Pulse: 84 93  89  Resp: 18 19  20   Temp: 98.2 F (36.8 C) 97.9 F (36.6 C)  97.7 F (36.5 C)  TempSrc: Oral Oral  Oral  SpO2: 100% 100% 100% 100%  Weight:      Height:        Intake/Output Summary (Last 24 hours) at 03/24/2019 0845 Last data filed at 03/24/2019 0522 Gross per 24 hour  Intake 0 ml  Output 900 ml  Net -900 ml   Filed Weights   03/18/19 2317 03/21/19 0547  Weight: 73.5 kg 108.2 kg    Physical Exam    GEN: Well nourished, well developed, in no acute distress.  HEENT: normal.  Neck: Supple, no JVD, carotid bruits, or masses. Cardiac: RRR, no murmurs, rubs, or gallops. No clubbing, cyanosis, edema.  Radials/DP/PT 2+ and equal bilaterally.  Respiratory:  Respirations regular and unlabored, clear to auscultation bilaterally. GI: Soft, nontender, nondistended, BS + x 4. MS: no deformity or atrophy. Skin: warm and dry, no rash. Neuro:  Strength and sensation are intact. Psych: Normal affect.  Labs    CBC Recent Labs    03/22/19 0837 03/22/19 0837 03/22/19 2004 03/23/19 0500  WBC 3.7*  --   --  5.2  NEUTROABS 2.3  --   --  2.9  HGB 7.0*   < > 8.4* 8.6*  HCT 21.7*   < > 26.8* 26.6*  MCV 108.5*  --   --  104.7*  PLT 71*  --   --  90*   < > = values in this interval not displayed.   Basic Metabolic Panel Recent Labs    03/22/19 0837 03/22/19 0837 03/23/19 0500 03/24/19 0500  NA 136  --  135  --   K 3.6  --  3.5  --   CL 109  --  107  --   CO2 20*  --  25  --   GLUCOSE 92  --  90  --   BUN 29*  --  24*  --   CREATININE 1.09*   < > 0.99 0.84  CALCIUM 7.5*  --  7.4*  --    < > = values in this interval not displayed.   Liver Function Tests No results for input(s): AST, ALT, ALKPHOS, BILITOT, PROT, ALBUMIN in the last 72 hours. No results for input(s): LIPASE, AMYLASE in the last 72 hours. Cardiac Enzymes No results for input(s): CKTOTAL,  CKMB, CKMBINDEX, TROPONINI in the last 72 hours. BNP No results for input(s): BNP in the last 72 hours. D-Dimer No results for input(s): DDIMER in the last 72 hours. Hemoglobin A1C No results for input(s): HGBA1C in the last 72 hours. Fasting Lipid Panel No results for input(s): CHOL, HDL, LDLCALC, TRIG, CHOLHDL, LDLDIRECT in the last 72 hours. Thyroid Function Tests No results for input(s): TSH, T4TOTAL, T3FREE, THYROIDAB in the last 72 hours.  Invalid input(s): FREET3  Telemetry    Atrial fibrillation  ECG    Atrial fibrillation with variable ventricular response  Radiology    CT HEAD WO CONTRAST  Result Date: 03/19/2019 CLINICAL DATA:  85 year old female with fall. EXAM: CT HEAD WITHOUT CONTRAST TECHNIQUE: Contiguous axial images were obtained from the base of the skull through the vertex without intravenous contrast. COMPARISON:  Head CT dated 09/08/2018. FINDINGS: Brain: There is moderate age-related atrophy and chronic microvascular ischemic changes. There is no acute intracranial hemorrhage. No mass effect or midline shift. No extra-axial fluid collection. Vascular: No hyperdense vessel or unexpected calcification. Skull: Normal. Negative for fracture or focal lesion. Sinuses/Orbits: There is opacification of the left frontal sinuses and maxillary sinuses with remodeling of the medial wall of the right maxillary sinus. Opacification of the left sphenoid sinuses. No air-fluid level. The mastoid air cells are clear. Other: None IMPRESSION: 1. No acute intracranial pathology. 2. Age-related atrophy and chronic microvascular ischemic changes. 3. Chronic paranasal sinus disease. Electronically Signed   By: Anner Crete M.D.   On: 03/19/2019 22:31   CT CERVICAL SPINE WO CONTRAST  Result Date: 03/23/2019 CLINICAL DATA:  Sepsis and lower extremity edema EXAM: CT CERVICAL SPINE WITHOUT CONTRAST TECHNIQUE: Multidetector CT imaging of the cervical spine was performed without intravenous  contrast. Multiplanar CT image reconstructions were also generated. COMPARISON:  09/08/2018 FINDINGS: Alignment: Grade 1 anterolisthesis at C3-4 and C4-5. Reversal of normal cervical lordosis may be positional or due to muscle spasm. Skull base and vertebrae: No acute fracture. Vertebral body heights are maintained. Soft tissues and spinal canal: No prevertebral fluid or swelling. No visible canal hematoma. Disc levels:  No bony spinal canal stenosis. Upper chest: Bilateral pleural effusions. Other: None IMPRESSION: 1. No acute fracture or static subluxation of the  cervical spine. 2. Bilateral pleural effusions. Electronically Signed   By: Ulyses Jarred M.D.   On: 03/23/2019 20:55   CT LUMBAR SPINE WO CONTRAST  Result Date: 03/23/2019 CLINICAL DATA:  Sepsis EXAM: CT LUMBAR SPINE WITHOUT CONTRAST TECHNIQUE: Multidetector CT imaging of the lumbar spine was performed without intravenous contrast administration. Multiplanar CT image reconstructions were also generated. COMPARISON:  CT myelogram 09/10/2004 FINDINGS: Segmentation: Normal Alignment: Grade 1 retrolisthesis at L2-3 and L3-4. Vertebrae: Unchanged chronic compression deformity of L2. L4-S1 posterior instrumented fusion. Paraspinal and other soft tissues: Calcific aortic atherosclerosis and bilateral pleural effusions. Horseshoe kidney. Disc levels: L2-3: Mild spinal canal stenosis due to combination of endplate spurring and disc bulge. Moderate facet hypertrophy. Neural foramina are patent. L3-4: Mild spinal canal stenosis due to disc bulge and facet hypertrophy. No stenosis. L4-5: Status post right laminectomy. No spinal canal or neural foraminal stenosis. L5-S1: Posterior decompression. No spinal canal stenosis. Moderate right and mild left neural foraminal stenosis, unchanged. IMPRESSION: 1. No acute fracture or static subluxation of the lumbar spine. Status post L4-S1 posterior instrumented fusion. No hardware adverse features. 2. Unchanged moderate  right and mild left L5-S1 neural foraminal stenosis. 3. Mild L2-3 and L3-4 spinal canal stenosis. 4. Aortic Atherosclerosis (ICD10-I70.0). Electronically Signed   By: Ulyses Jarred M.D.   On: 03/23/2019 21:10   DG Chest Port 1 View  Result Date: 03/22/2019 CLINICAL DATA:  PICC placement EXAM: PORTABLE CHEST 1 VIEW COMPARISON:  03/18/2019 chest radiograph. FINDINGS: Left PICC terminates at the cavoatrial junction. Stable cardiomediastinal silhouette with mild cardiomegaly. No pneumothorax. Small bilateral pleural effusions, stable. Patchy hazy and linear interstitial opacities throughout both lungs, similar. IMPRESSION: 1. Left PICC terminates at the cavoatrial junction. 2. Stable small bilateral pleural effusions. 3. Mild cardiomegaly. Patchy hazy and linear interstitial opacities throughout both lungs, similar, either pulmonary edema or atypical pneumonia. Electronically Signed   By: Ilona Sorrel M.D.   On: 03/22/2019 18:17   DG Chest Port 1 View  Result Date: 03/18/2019 CLINICAL DATA:  85 year old female with sepsis. EXAM: PORTABLE CHEST 1 VIEW COMPARISON:  Chest radiograph dated 04/20/2017. FINDINGS: There is chronic interstitial coarsening and bronchitic changes. Overall slight interval progression of interstitial densities since the prior radiograph which may represent progression of background of lung disease although superimposed pneumonia is not excluded. Clinical correlation is recommended. No focal consolidation or pneumothorax. Trace bilateral pleural effusions may be present. There is mild cardiomegaly. Atherosclerotic calcification of the aorta. No acute osseous pathology. IMPRESSION: Slight interval progression of interstitial densities may represent progression of lung disease versus superimposed pneumonia. Electronically Signed   By: Anner Crete M.D.   On: 03/18/2019 23:43   ECHOCARDIOGRAM COMPLETE  Result Date: 03/20/2019    ECHOCARDIOGRAM REPORT   Patient Name:   Brooke Baker  Date of Exam: 03/20/2019 Medical Rec #:  [id number]        Height:       66.0 in Accession #:    [id number]       Weight:       162.0 lb Date of Birth:  12-08-1934        BSA:          1.828 m Patient Age:    89 years         BP:           57/93 mmHg Patient Gender: F                HR:  114 bpm. Exam Location:  [facility] Procedure: 2D Echo, Cardiac Doppler and Color Doppler Indications:     Dyspnea 786.09  History:         Patient has no prior history of Echocardiogram examinations.                  CAD, COPD; Risk Factors:Hypertension. CVA.  Sonographer:     Sherrie Sport RDCS (AE) Referring Phys:  [id number] [facility] AMIN Diagnosing Phys: Bartholome Bill MD IMPRESSIONS  1. Left ventricular ejection fraction, by estimation, is 70 to 75%. Left ventricular ejection fraction by PLAX is 75 %. The left ventricle has hyperdynamic function. The left ventricle has no regional wall motion abnormalities. Left ventricular diastolic parameters are consistent with Grade I diastolic dysfunction (impaired relaxation).  2. Right ventricular systolic function is normal. The right ventricular size is mildly enlarged. There is moderately elevated pulmonary artery systolic pressure.  3. Left atrial size was mildly dilated.  4. Right atrial size was mildly dilated.  5. The mitral valve is grossly normal. Mild mitral valve regurgitation.  6. The aortic valve is grossly normal. Aortic valve regurgitation is trivial. FINDINGS  Left Ventricle: Left ventricular ejection fraction, by estimation, is 70 to 75%. Left ventricular ejection fraction by PLAX is 75 % The left ventricle has hyperdynamic function. The left ventricle has no regional wall motion abnormalities. The left ventricular internal cavity size was normal in size. There is borderline left ventricular hypertrophy. Left ventricular diastolic parameters are consistent with Grade I diastolic dysfunction (impaired relaxation). Right Ventricle: The right ventricular size is mildly enlarged.  No increase in right ventricular wall thickness. Right ventricular systolic function is normal. There is moderately elevated pulmonary artery systolic pressure. The tricuspid regurgitant velocity is 3.27 m/s, and with an assumed right atrial pressure of 10 mmHg, the estimated right ventricular systolic pressure is 123XX123 mmHg. Left Atrium: Left atrial size was mildly dilated. Right Atrium: Right atrial size was mildly dilated. Pericardium: There is no evidence of pericardial effusion. Mitral Valve: The mitral valve is grossly normal. Mild mitral valve regurgitation. Tricuspid Valve: The tricuspid valve is grossly normal. Tricuspid valve regurgitation is mild. Aortic Valve: The aortic valve is grossly normal. Aortic valve regurgitation is trivial. Aortic valve mean gradient measures 3.5 mmHg. Aortic valve peak gradient measures 7.2 mmHg. Aortic valve area, by VTI measures 2.48 cm. Pulmonic Valve: The pulmonic valve was grossly normal. Pulmonic valve regurgitation is mild. Aorta: The aortic root was not well visualized. IAS/Shunts: The atrial septum is grossly normal.  LEFT VENTRICLE PLAX 2D LV EF:         Left ventricular ejection fraction by PLAX is 75 % LVIDd:         3.69 cm LVIDs:         2.09 cm LV PW:         1.35 cm LV IVS:        0.78 cm LVOT diam:     2.00 cm LV SV:         58 LV SV Index:   32 LVOT Area:     3.14 cm  RIGHT VENTRICLE RV Basal diam:  4.34 cm LEFT ATRIUM            Index       RIGHT ATRIUM           Index LA diam:      4.70 cm  2.57 cm/m  RA Area:     24.40 cm LA Vol (A2C):  158.0 ml 86.41 ml/m RA Volume:   87.20 ml  47.69 ml/m LA Vol (A4C): 52.8 ml  28.88 ml/m  AORTIC VALVE                   PULMONIC VALVE AV Area (Vmax):    2.20 cm    PV Vmax:        0.71 m/s AV Area (Vmean):   2.43 cm    PV Peak grad:   2.0 mmHg AV Area (VTI):     2.48 cm    RVOT Peak grad: 2 mmHg AV Vmax:           134.00 cm/s AV Vmean:          87.550 cm/s AV VTI:            0.234 m AV Peak Grad:      7.2 mmHg AV  Mean Grad:      3.5 mmHg LVOT Vmax:         93.80 cm/s LVOT Vmean:        67.600 cm/s LVOT VTI:          0.185 m LVOT/AV VTI ratio: 0.79  AORTA Ao Root diam: 3.10 cm MITRAL VALVE                TRICUSPID VALVE MV Area (PHT): 4.89 cm     TR Peak grad:   42.8 mmHg MV Decel Time: 155 msec     TR Vmax:        327.00 cm/s MV E velocity: 127.00 cm/s MV A velocity: 128.00 cm/s  SHUNTS MV E/A ratio:  0.99         Systemic VTI:  0.18 m                             Systemic Diam: 2.00 cm Bartholome Bill MD Electronically signed by Bartholome Bill MD Signature Date/Time: 03/20/2019/4:00:44 PM    Final    ECHO TEE  Result Date: 03/21/2019    TRANSESOPHOGEAL ECHO REPORT   Patient Name:   Brooke Baker Date of Exam: 03/21/2019 Medical Rec #:  [id number]        Height:       66.0 in Accession #:    [id number]       Weight:       238.5 lb Date of Birth:  Jun 20, 1934        BSA:          2.155 m Patient Age:    20 years         BP:           107/40 mmHg Patient Gender: F                HR:           111 bpm. 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 COPD; Risk Factors:Hypertension and Diabetes.                  CVA.  Sonographer:     Sherrie Sport RDCS (AE) Referring Phys:  [place] Diagnosing Phys: Bartholome Bill  MD PROCEDURE: TEE procedure time was 16 minutes. The transesophogeal probe was passed without difficulty through the esophogus of the patient. Imaged were obtained with the patient in a left lateral decubitus position. Local oropharyngeal anesthetic was provided with viscous lidocaine and Benzocaine spray. Sedation performed by performing physician. Image quality was excellent. The patient's vital signs; including heart rate, blood pressure, and oxygen saturation; remained stable throughout the  procedure. The patient developed no complications during the procedure. IMPRESSIONS  1. Left ventricular ejection fraction, by estimation, is 55 to 60%. The left ventricle has normal function. The left ventricle has no regional wall motion abnormalities.  2. Right ventricular systolic function is normal. The right ventricular size is mildly enlarged.  3. No left atrial/left atrial appendage thrombus was detected.  4. Right atrial size was moderately dilated.  5. The mitral valve is grossly normal. Mild mitral valve regurgitation.  6. Tricuspid valve regurgitation is mild to moderate.  7. The aortic valve is tricuspid. Aortic valve regurgitation is trivial. No aortic stenosis is present. Conclusion(s)/Recommendation(s): No evidence of vegetation/infective endocarditis on this transesophageal echocardiogram. FINDINGS  Left Ventricle: Left ventricular ejection fraction, by estimation, is 55 to 60%. The left ventricle has normal function. The left ventricle has no regional wall motion abnormalities. The left ventricular internal cavity size was normal in size. There is  no left ventricular hypertrophy. Right Ventricle: The right ventricular size is mildly enlarged. No increase in right ventricular wall thickness. Right ventricular systolic function is normal. Left Atrium: Left atrial size was normal in size. No left atrial/left atrial appendage thrombus was detected. Right Atrium: Right atrial size was moderately dilated. Pericardium: There is no evidence of pericardial effusion. Mitral Valve: The mitral valve is grossly normal. Mild mitral valve regurgitation. There is no evidence of mitral valve vegetation. Tricuspid Valve: The tricuspid valve is grossly normal. Tricuspid valve regurgitation is mild to moderate. Aortic Valve: The aortic valve is tricuspid. Aortic valve regurgitation is trivial. No aortic stenosis is present. There is no evidence of aortic valve vegetation. Pulmonic Valve: The pulmonic valve was not  well visualized. Pulmonic valve regurgitation is trivial. Aorta: The aortic root is normal in size and structure. IAS/Shunts: The atrial septum is grossly normal. Bartholome Bill MD Electronically signed by Bartholome Bill MD Signature Date/Time: 03/21/2019/10:50:39 AM    Final    [place] EKG SITE RITE  Result Date: 03/22/2019 If Site Rite image not attached, placement could not be confirmed due to current cardiac rhythm.   Assessment & Plan    Septicemia- No echo evidence of endocarditis.   Both transthoracic and transesophageal echo showed no evidence of vegetations with good valve function.  Would continue with antibiotic therapy.  Thrombocytopenia-etiology unclear.Most recent value was 90K. Hemoglobin currently 8.6 after transfusion.  Platelet count improved..Patient's CHA2DS2-VASc score is quitehigh at at least 7however would continue to hold Pradaxa due to recurrent anemia. Will need discussed resuming Pradaxa at follow-up office visit after discharge.  We will schedule outpatient follow-up after discharge with Dr. Tomasita Crumble shows.  HFpEF-EF normal by echo . Also normal by functional study done in January. There was no evidence of ischemia at that time.Renal function slightly decreased since admission. Likely overdiuresis. Does not report to be in florid pulmonary edema.Patient does not appear to be in florid pulmonary edema at present clinically.  Coronary artery disease-status post PCI as mentioned above. Does not appear ischemic at present. We will continue to follow. We will continue aspirin for now however this may need to be  addressed as platelet count varies.    Signed, Javier Docker Altus Zaino MD 03/24/2019, 8:45 AM  Pager: [phone]

## 2019-03-24 NOTE — TOC Progression Note (Signed)
Transition of Care [facility]) - Progression Note    Patient Details  Name: Brooke Baker MRN: [id number] Date of Birth: 06-01-1934  Transition of Care [facility]) CM/SW [place], RN Phone Number: 03/24/2019, 2:36 PM  Clinical Narrative:     Spoke with Patient and daughter , Bed offers presented.  They have chosen [facility].  Left vm and text with Tina at [facility].  Waiting for a call back.   Expected Discharge Plan: [place] Barriers to Discharge: Continued Medical Work up  Expected Discharge Plan and Services Expected Discharge Plan: [place] In-house Referral: Clinical Social Work Discharge Planning Services: CM Consult Post Acute Care Choice: [place] arrangements for the past 2 months: Single Family Home                 DME Arranged: N/A DME Agency: NA       HH Arranged: NA HH Agency: NA         Social Determinants of Health (SDOH) Interventions    Readmission Risk Interventions No flowsheet data found.

## 2019-03-24 NOTE — Progress Notes (Signed)
Physical Therapy Treatment Patient Details Name: Brooke Baker MRN: [id number] DOB: 03-07-1934 Today's Date: 03/24/2019    History of Present Illness Brooke Baker  is a 85 y.o. Caucasian female with a known history of multiple medical problems that are mentioned below including COPD, type diabetes mellitus, hypertension, dyslipidemia and coronary artery disease, who presented to the emergency room with acute onset of fever with associated generalized weakness and dyspnea without significant cough and wheezing. Found to be septic with progressive lung disease with pneumonia. Patient on chronic O2 3-4L, is ind with ADLs with RW.  Patient recently with downtrending HCT and Hgb recieving blood transfusion on 03/21/19 and 03/22/19.    PT Comments    Pt was supine in bed upon arriving. Very cooperative and motivated to participate. She was on 2 L o2 throughout session with sao2 >90%. HR elevated to 112 during ambulation. Pt was able to exit L side of bed with increased time and mod assist. She stood and ambulated around bed ~ 15 ft but does have LOB which required max assist to safely sit back into bed. Pt very scared/nervous about this episode and is now in agreement she will require SNF at DC. Pt has been refusing SNF but now is willing. Therapist reached out to daughter, however unable to get a hold of. CM and RN notified of near fall and continued recommendation for rehab. She will benifit from SNF to address deficits with gait, balance, strength, and overall safe functional mobility. Pt was resting comfortably in bed with bed alarm placed and call bell in reach.      Follow Up Recommendations  SNF     Equipment Recommendations  Rolling walker with 5" wheels    Recommendations for Other Services       Precautions / Restrictions Precautions Precautions: Fall;Other (comment) Restrictions Weight Bearing Restrictions: No    Mobility  Bed Mobility Overal bed mobility: Needs  Assistance Bed Mobility: Supine to Sit     Supine to sit: Mod assist Sit to supine: Mod assist   General bed mobility comments: Pt requires mod assist to exit and re-enter bed. Truck support and BLE assist to EOB/ return to supine  Transfers Overall transfer level: Needs assistance Equipment used: Rolling walker (2 wheeled) Transfers: Sit to/from Stand Sit to Stand: Min assist;From elevated surface         General transfer comment: Pt was able to stand from elevated bed height to RW with gait belt + min assist. Mod assist from lower bed height.  Ambulation/Gait Ambulation/Gait assistance: Min guard;Max assist Gait Distance (Feet): 15 Feet Assistive device: Rolling walker (2 wheeled) Gait Pattern/deviations: Trunk flexed;Shuffle Gait velocity: decreased   General Gait Details: Pt was able to ambulate 1 x around bed with CGA throughout most of gait however did have LOB with max assist required to safely sit back in back. pt very afraid afterwards and agrees with therapist about DC to SNF as a safer d/c disposition then home. pt has been refusing rehab up until now.   Stairs             Wheelchair Mobility    Modified Rankin (Stroke Patients Only)       Balance Overall balance assessment: Needs assistance;History of Falls Sitting-balance support: Feet supported;No upper extremity supported Sitting balance-Leahy Scale: Good Sitting balance - Comments: No LOB seated EOB   Standing balance support: During functional activity Standing balance-Leahy Scale: Poor Standing balance comment: pt has episode of LOB with max assist  and intervention form therapist to prevent fall                            Cognition Arousal/Alertness: Awake/alert Behavior During Therapy: WFL for tasks assessed/performed Overall Cognitive Status: Within Functional Limits for tasks assessed                                 General Comments: Pt was cooperative and  motivated. Was able to follow command consistantly      Exercises      General Comments        Pertinent Vitals/Pain Pain Assessment: No/denies pain    Home Living                      Prior Function            PT Goals (current goals can now be found in the care plan section) Acute Rehab PT Goals Patient Stated Goal: " I want to get better so I can go home." Progress towards PT goals: Progressing toward goals    Frequency    Min 2X/week      PT Plan Current plan remains appropriate    Co-evaluation              AM-PAC PT "6 Clicks" Mobility   Outcome Measure  Help needed turning from your back to your side while in a flat bed without using bedrails?: A Little Help needed moving from lying on your back to sitting on the side of a flat bed without using bedrails?: A Lot Help needed moving to and from a bed to a chair (including a wheelchair)?: A Little Help needed standing up from a chair using your arms (e.g., wheelchair or bedside chair)?: A Little Help needed to walk in hospital room?: A Lot Help needed climbing 3-5 steps with a railing? : A Lot 6 Click Score: 15    End of Session Equipment Utilized During Treatment: Gait belt;Oxygen Activity Tolerance: Patient tolerated treatment well Patient left: in bed;with call bell/phone within reach;with bed alarm set;with family/visitor present Nurse Communication: Mobility status PT Visit Diagnosis: Unsteadiness on feet (R26.81);Muscle weakness (generalized) (M62.81);Other abnormalities of gait and mobility (R26.89);History of falling (Z91.81);Difficulty in walking, not elsewhere classified (R26.2)     Time: [id number] PT Time Calculation (min) (ACUTE ONLY): 25 min  Charges:  $Gait Training: 8-22 mins $Therapeutic Activity: 8-22 mins                     Julaine Fusi PTA 03/24/19, 12:09 PM

## 2019-03-24 NOTE — NC FL2 (Addendum)
[place] LEVEL OF CARE SCREENING TOOL     IDENTIFICATION  Patient Name: Brooke Baker Birthdate: 08-Sep-1934 Sex: female Admission Date (Current Location): 03/18/2019  [place] and [place] Number:  Engineering geologist and Address:  [facility], [address], [place], [place] [address]      Provider Number: [id number]  Attending Physician Name and Address:  Para Skeans, MD  Relative Name and Phone Number:  Juanda Bond  [phone]    Current Level of Care: Hospital Recommended Level of Care: [place] Prior Approval Number:    Date Approved/Denied:   PASRR Number: [id number]  Discharge Plan: SNF    Current Diagnoses: Patient Active Problem List   Diagnosis Date Noted  . Community acquired pneumonia   . Sepsis ([place]) 03/19/2019  . Chronic respiratory failure ([place]) 02/05/2019  . Macrocytic anemia 01/14/2019  . Skin lesion 11/13/2018  . Leg lesion 11/13/2018  . Cough 09/25/2018  . Fall 09/25/2018  . Thrombocytopenia ([place]) 09/11/2018  . Hyperglycemia 09/11/2018  . ILD (interstitial lung disease) ([place]) 01/14/2018  . CHF (congestive heart failure) ([place]) 07/01/2017  . Therapeutic drug monitoring 03/25/2017  . BMI 39.0-39.9,adult 03/07/2017  . Falls frequently 03/18/2016  . Psoriatic arthritis ([place]) 05/14/2015  . Health care maintenance 10/31/2014  . Stress 12/03/2013  . Obesity 08/01/2013  . Neuropathy 08/01/2013  . Macrocytosis 08/01/2013  . Hypertension 12/18/2011  . Chronic back pain 12/18/2011  . Seizure disorder ([place]) 12/18/2011  . AF (paroxysmal atrial fibrillation) ([place]) 12/18/2011  . Hypercholesterolemia 12/18/2011  . Asthma, moderate persistent 04/08/2009  . Coronary atherosclerosis 04/19/2007  . History of CVA (cerebrovascular accident) 04/19/2007  . ASTHMATIC BRONCHITIS, ACUTE 04/19/2007  . Seasonal and perennial allergic rhinitis 04/19/2007    Orientation RESPIRATION BLADDER Height &  Weight     Self, Time, Situation, Place  Normal, O2(2lnc) Incontinent Weight: (pt on low bed) Height:  5\' 6"  (167.6 cm)  BEHAVIORAL SYMPTOMS/MOOD NEUROLOGICAL BOWEL NUTRITION STATUS      Incontinent Diet(heart healthy)  AMBULATORY STATUS COMMUNICATION OF NEEDS Skin   Extensive Assist Verbally Bruising                       Personal Care Assistance Level of Assistance  Bathing, Feeding, Dressing Bathing Assistance: Limited assistance Feeding assistance: Independent Dressing Assistance: Limited assistance     Functional Limitations Info  Sight Sight Info: Adequate(glasses)        SPECIAL CARE FACTORS FREQUENCY  PT (By licensed PT), OT (By licensed OT)     PT Frequency: 5x a week OT Frequency: 5x a week            Contractures Contractures Info: Not present    Additional Factors Info  Allergies, Isolation Precautions   Allergies Info: doxycycline, methatrexate     Isolation Precautions Info: contact for MRSA     Current Medications (03/24/2019):  This is the current hospital active medication list Current Facility-Administered Medications  Medication Dose Route Frequency Provider Last Rate Last Admin  . acetaminophen (TYLENOL) tablet 650 mg  650 mg Oral Q6H PRN Mansy, Jan A, MD       Or  . acetaminophen (TYLENOL) suppository 650 mg  650 mg Rectal Q6H PRN Mansy, Jan A, MD      . aspirin EC tablet 81 mg  81 mg Oral Daily Teodoro Spray, MD   81 mg at 03/24/19 0934  . Chlorhexidine Gluconate Cloth 2 % PADS 6 each  6 each Topical Daily Lorella Nimrod, MD   6 each at 03/24/19 0935  . dabigatran (PRADAXA) capsule 150 mg  150 mg Oral Q12H Para Skeans, MD   150 mg at 03/24/19 0934  . fluocinonide ointment (LIDEX) 0.05 %   Topical BID Leonel Ramsay, MD   Given at 03/24/19 0935  . fluticasone (FLONASE) 50 MCG/ACT nasal spray 2 spray  2 spray Each Nare Daily Mansy, Jan A, MD   2 spray at 03/24/19 0936  . folic acid (FOLVITE) tablet 1 mg  1 mg Oral Daily Sindy Guadeloupe, MD   1 mg at 03/24/19 0934  . furosemide (LASIX) tablet 40 mg  40 mg Oral Daily Lorella Nimrod, MD   40 mg at 03/24/19 0934  . gabapentin (NEURONTIN) capsule 100 mg  100 mg Oral BID Mansy, Jan A, MD   100 mg at 03/24/19 0934  . insulin aspart (novoLOG) injection 0-9 Units  0-9 Units Subcutaneous TID PC & HS Mansy, Arvella Merles, MD   1 Units at 03/23/19 1707  . levETIRAcetam (KEPPRA) tablet 750 mg  750 mg Oral BID Mansy, Jan A, MD   750 mg at 03/24/19 0934  . magnesium hydroxide (MILK OF MAGNESIA) suspension 30 mL  30 mL Oral Daily PRN Mansy, Jan A, MD      . metoprolol succinate (TOPROL-XL) 24 hr tablet 50 mg  50 mg Oral Daily Mansy, Jan A, MD   50 mg at 03/24/19 0934  . mupirocin ointment (BACTROBAN) 2 %   Topical BID Mansy, Arvella Merles, MD   Given at 03/24/19 0935  . nystatin-triamcinolone (MYCOLOG II) cream   Topical BID Mansy, Arvella Merles, MD   Given at 03/24/19 0935  . omega-3 acid ethyl esters (LOVAZA) capsule 1 g  1 g Oral Daily Mansy, Jan A, MD   1 g at 03/24/19 0934  . ondansetron (ZOFRAN) tablet 4 mg  4 mg Oral Q6H PRN Mansy, Jan A, MD       Or  . ondansetron [facility]) injection 4 mg  4 mg Intravenous Q6H PRN Mansy, Jan A, MD      . phenytoin (DILANTIN) ER capsule 400 mg  400 mg Oral QHS Mansy, Jan A, MD   400 mg at 03/23/19 2203  . polyethylene glycol (MIRALAX / GLYCOLAX) packet 17 g  17 g Oral Daily Mansy, Jan A, MD   17 g at 03/23/19 [id number]  . rosuvastatin (CRESTOR) tablet 20 mg  20 mg Oral Daily Mansy, Jan A, MD   20 mg at 03/23/19 1707  . sertraline (ZOLOFT) tablet 75 mg  75 mg Oral Daily Mansy, Jan A, MD   75 mg at 03/23/19 0848  . sodium chloride flush (NS) 0.9 % injection 10-40 mL  10-40 mL Intracatheter Q12H Lorella Nimrod, MD   10 mL at 03/24/19 0937  . sodium chloride flush (NS) 0.9 % injection 10-40 mL  10-40 mL Intracatheter Q12H Para Skeans, MD   10 mL at 03/24/19 0936  . sodium chloride flush (NS) 0.9 % injection 10-40 mL  10-40 mL Intracatheter PRN Para Skeans, MD      .  traZODone (DESYREL) tablet 25 mg  25 mg Oral QHS PRN Mansy, Jan A, MD   25 mg at 03/19/19 2304  . triamcinolone ointment (KENALOG) 0.1 %   Topical TID Leonel Ramsay, MD   Given at 03/24/19 0935  . vancomycin (VANCOCIN) IVPB 1000 mg/200 mL premix  1,000 mg Intravenous Q24H  Benita Gutter, RPH 200 mL/hr at 03/23/19 1715 1,000 mg at 03/23/19 1715     Discharge Medications: Please see discharge summary for a list of discharge medications.  Relevant Imaging Results:  Relevant Lab Results:   Additional Information SS# [id number], IV therapy  Vanc until 04/15/2019  Victorino Dike, RN

## 2019-03-24 NOTE — Progress Notes (Signed)
Per daughter [name] patient has had both doses of covid vaccine

## 2019-03-24 NOTE — Plan of Care (Signed)
  Problem: Pain Managment: Goal: General experience of comfort will improve Outcome: Progressing   

## 2019-03-24 NOTE — Plan of Care (Signed)
  Problem: Nutrition: Goal: Adequate nutrition will be maintained Outcome: Not Progressing Patient refusing to eat.   Problem: Education: Goal: Knowledge of General Education information will improve Description: Including pain rating scale, medication(s)/side effects and non-pharmacologic comfort measures Outcome: Progressing   Problem: Health Behavior/Discharge Planning: Goal: Ability to manage health-related needs will improve Outcome: Progressing   Problem: Clinical Measurements: Goal: Will remain free from infection Outcome: Progressing

## 2019-03-24 NOTE — Progress Notes (Signed)
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 To discharging provider:  please sign these orders via discharge navigator,  Select New Orders & click on the button choice - Manage This Unsigned Work.     Thank you for allowing pharmacy to be a part of this patient's care.  Doreene Eland, PharmD, BCPS.   Work Cell: [phone] 03/24/2019 7:35 AM

## 2019-03-24 NOTE — Progress Notes (Signed)
PROGRESS NOTE    Brooke Baker  [id number] DOB: 26-Jun-1934 DOA: 03/18/2019 PCP: Einar Pheasant, MD   Brief Narrative:  [name], type diabetes mellitus, hypertension, dyslipidemia and coronary artery disease, who presented to the emergency room with acute onset of fever with associated generalized weakness and dyspnea without significant cough and wheezing. Brooke Baker has been having orthopnea and paroxysmal nocturnal dyspnea as well as worsening lower extremity edema. Brooke Baker admits to dyspnea on exertion. Brooke Baker admitted to nausea and vomiting without abdominal pain.  On presentation febrile, chest x-ray with bilateral interstitial densities concerning for disease progression versus superimposed pneumonia. Brooke Baker was started on ceftriaxone and azithromycin. Blood cultures are growing staph aureus.  Antibiotics switched with vancomycin.  03/22/19 Patient was feeling little better when seen today.  No new complaints. Labs shows anemia with hemoglobin of 7.0 and  Brooke Baker pradaxa has been held for Brooke Baker chronic a.fib.  Oncology is on board as is GI and cardiology. Brooke Baker is awake,alert and cooperative and oriented Denies any complaints of chest pain or sob.  03/23/19: Brooke Baker seen today and is alert and awake and denies any complaints. Ros is o/w negative. Hb is 8.6 post 2 unit prbc.  Ct imaging pending.  3/19 Brooke Baker seen today and Brooke Baker is alert/awake d/w Brooke Baker about need for rehab and Brooke Baker understands.  States Brooke Baker feel good otherwise , d/w Brooke Baker about restarting anticoagulation for Brooke Baker a.fib.  And reason we held uit due to Brooke Baker platelet and anemia.  Assessment & Plan:   Active Problems:   AF (paroxysmal atrial fibrillation) (HCC)   Macrocytic anemia   Chronic respiratory failure ([place])   Sepsis ([place])   Community acquired pneumonia  Sepsis secondary to MRSA bacteremia and bilateral  pneumonia.  TTE and TEE was negative for vegetations.  Patient has hardware due to right hip and knee replacement and high risk for seeding.  Blood culture drawn on 03/20/19 remain negative so far.  ID was consulted-recommending at least 4-week of vancomycin. -PICC line will be placed today -ID is also recommending CT spine before discharge to rule out any osteomyelitis or discitis which was done. -Brooke Baker/OT are recommending SNF placement but patient wants to go home with home health services. -Continue supportive care with [facility].  Acute on chronic diastolic CHF, possibly cor pulmonale.  Brooke Baker has a history of preserved EF of 55% with NYHA class II- III ACC/AHA stage C. Patient is on 2 L at home. Brooke Baker was initially diuresed with Lasix 40 mg twice daily, which was discontinued due to increased in creatinine. -Start Brooke Baker on p.o. Lasix 40 mg daily from tomorrow. -Continue to monitor. -Continue daily BMP, strict intake and output and daily weight. -Echocardiogram-with normal EF and no wall motion abnormalities.  Paroxysmal atrial fibrillation.  Currently regular and rate controlled. -Continue Toprol-XL and Pradaxa held for anemia. -Cardiology is following and we appreciate their recommendations.  Hypertension.  Blood pressure within goal. -Continue home antihypertensives.  Macrocytic anemia.  Hemoglobin stable.  Patient follow-up with hematology.  Brooke Baker has rheumatoid factor positive psoriatic arthritis.  Labs were consistent with hypoproliferative bone marrow.  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  They were planning to do a bone marrow evaluation if hemoglobin continued to drop, the time of prior hematology visit Brooke Baker hemoglobin was 9.7.   Hemoglobin dropped to 7.2 today without any obvious bleeding.  After discussing with Brooke Baker hematologist's we will transfuse Brooke Baker with 1 unit of PRBC. -  2 unit packed RBCs ordered. -Continue to monitor. -zoloft also causes anemia and bleeding.  Pancytopenia/Thrombocytopenia.   Worsening thrombocytopenia with platelet of 71.  Brooke Baker has chronic thrombocytopenia and follow-up with hematologist thought to be due to MDS and hypoproliferative bone marrow.  Patient is also on Pradaxa which increased Brooke Baker chances of bleeding. Discussed with Dr. Janese Banks who saw the patient and recommending continuation of Pradaxa at this time.  Pradaxa need to be discontinued if platelets drop below 50,000. -We will discontinue pradaxa due to anemia -Continue to monitor platelets. -Monitor for any sign of bleeding. -suspect pancytopenia could also be due to combination of antiseizure meds.    History of seizure disorder.  No acute concern. -We will continue home antiseizure meds.  Dyslipidemia. -Continue statin.  Depression. -Continue Zoloft.  Objective: Vitals:   03/24/19 0525 03/24/19 0933 03/24/19 1210 03/24/19 1633  BP: (!) 118/52 115/88 (!) 118/59 95/65  Pulse: 89 88 85 62  Resp: 20 18 18    Temp: 97.7 F (36.5 C) 97.9 F (36.6 C) 97.9 F (36.6 C) (!) 97.5 F (36.4 C)  TempSrc: Oral Oral Oral Oral  SpO2: 100% 98% 100% 92%  Weight:      Height:        Intake/Output Summary (Last 24 hours) at 03/24/2019 1804 Last data filed at 03/24/2019 1752 Gross per 24 hour  Intake 250 ml  Output 2100 ml  Net -1850 ml   Filed Weights   03/18/19 2317 03/21/19 0547  Weight: 73.5 kg 108.2 kg    Examination:  General exam: Appears calm and comfortable  Respiratory system: Clear to auscultation. Respiratory effort normal. Cardiovascular system: S1 & S2 heard, RRR. No JVD, murmurs, rubs, gallops or clicks. Gastrointestinal system: Soft, nontender, nondistended, bowel sounds positive. Central nervous system: Alert and oriented. No focal neurological deficits.Symmetric 5 x 5 power. Extremities: 1+LE edema with signs of chronic venous dermatitis, 2+ upper extremity edema, worse on left with ecchymosis,pulses intact and symmetrical. Psychiatry: Judgement and insight appear normal. Mood &  affect appropriate.   Consultants:   Hematology  Cardiology  ID  Antimicrobials:  Vancomycin  Data Reviewed: I have personally reviewed following labs and imaging studies  CBC: Recent Labs  Lab 03/19/19 0036 03/19/19 0812 03/20/19 0450 03/20/19 0450 03/21/19 0643 03/21/19 0643 03/21/19 2220 03/22/19 0837 03/22/19 2004 03/23/19 0500 03/24/19 0500  WBC 5.4   < > 5.0  --  4.7  --   --  3.7*  --  5.2 5.2  NEUTROABS 4.6  --   --   --  3.2  --   --  2.3  --  2.9 2.9  HGB 8.8*   < > 8.1*   < > 7.2*   < > 7.6* 7.0* 8.4* 8.6* 8.5*  HCT 28.2*   < > 25.3*   < > 22.5*  --   --  21.7* 26.8* 26.6* 26.4*  MCV 109.3*   < > 107.2*  --  109.2*  --   --  108.5*  --  104.7* 105.2*  PLT 83*   < > 68*  --  71*  --   --  71*  --  90* 114*   < > = values in this interval not displayed.   Basic Metabolic Panel: Recent Labs  Lab 03/20/19 0450 03/20/19 0450 03/21/19 0643 03/22/19 0837 03/23/19 0500 03/24/19 0500 03/24/19 1235  NA 133*  --  136 136 135 136  --   K 4.4   < > 3.7 3.6  3.5 3.9 4.1  CL 105  --  107 109 107 105  --   CO2 20*  --  23 20* 25 25  --   GLUCOSE 117*  --  95 92 90 96  --   BUN 26*  --  32* 29* 24* 21  --   CREATININE 1.24*  --  1.36* 1.09* 0.99 0.90  0.84  --   CALCIUM 7.4*  --  7.5* 7.5* 7.4* 7.4*  --   MG  --   --   --   --   --  1.2*  --   PHOS  --   --   --   --   --  1.8*  --    < > = values in this interval not displayed.   GFR: Estimated Creatinine Clearance: 64.3 mL/min (by C-G formula based on SCr of 0.84 mg/dL). Liver Function Tests: Recent Labs  Lab 03/19/19 0036 03/24/19 0500  AST 31 39  ALT 10 12  ALKPHOS 108 82  BILITOT 1.5* 1.3*  PROT 7.4 6.1*  ALBUMIN 2.4* 1.9*   Recent Labs  Lab 03/19/19 0036  LIPASE 21   No results for input(s): AMMONIA in the last 168 hours. Coagulation Profile: Recent Labs  Lab 03/19/19 0036 03/21/19 0643  INR 1.7* 2.1*   Cardiac Enzymes: No results for input(s): CKTOTAL, CKMB, CKMBINDEX, TROPONINI  in the last 168 hours. BNP (last 3 results) No results for input(s): PROBNP in the last 8760 hours. HbA1C: No results for input(s): HGBA1C in the last 72 hours. CBG: Recent Labs  Lab 03/23/19 1642 03/23/19 2124 03/24/19 0824 03/24/19 1211 03/24/19 1629  GLUCAP 124* 111* 94 101* 91   Lipid Profile: No results for input(s): CHOL, HDL, LDLCALC, TRIG, CHOLHDL, LDLDIRECT in the last 72 hours. Thyroid Function Tests: No results for input(s): TSH, T4TOTAL, FREET4, T3FREE, THYROIDAB in the last 72 hours. Anemia Panel: No results for input(s): VITAMINB12, FOLATE, FERRITIN, TIBC, IRON, RETICCTPCT in the last 72 hours. Sepsis Labs: Recent Labs  Lab 03/19/19 0036 03/19/19 0812  PROCALCITON 0.44  --   LATICACIDVEN 1.7 1.7    Recent Results (from the past 240 hour(s))  Respiratory Panel by RT PCR (Flu A&B, Covid) - Nasopharyngeal Swab     Status: None   Collection Time: 03/19/19 12:32 AM   Specimen: Nasopharyngeal Swab  Result Value Ref Range Status   SARS Coronavirus 2 by RT PCR NEGATIVE NEGATIVE Final    Comment: (NOTE) SARS-CoV-2 target nucleic acids are NOT DETECTED. The SARS-CoV-2 RNA is generally detectable in upper respiratoy specimens during the acute phase of infection. The lowest concentration of SARS-CoV-2 viral copies this assay can detect is 131 copies/mL. A negative result does not preclude SARS-Cov-2 infection and should not be used as the sole basis for treatment or other patient management decisions. A negative result may occur with  improper specimen collection/handling, submission of specimen other than nasopharyngeal swab, presence of viral mutation(s) within the areas targeted by this assay, and inadequate number of viral copies (<131 copies/mL). A negative result must be combined with clinical observations, patient history, and epidemiological information. The expected result is Negative. Fact Sheet for Patients:   PinkCheek.be Fact Sheet for Healthcare Providers:  GravelBags.it This test is not yet ap proved or cleared by the [place] FDA and  has been authorized for detection and/or diagnosis of SARS-CoV-2 by FDA under an Emergency Use Authorization (EUA). This EUA will remain  in effect (meaning this test can be  used) for the duration of the COVID-19 declaration under Section 564(b)(1) of the Act, 21 U.S.C. section 360bbb-3(b)(1), unless the authorization is terminated or revoked sooner.    Influenza A by PCR NEGATIVE NEGATIVE Final   Influenza B by PCR NEGATIVE NEGATIVE Final    Comment: (NOTE) The Xpert Xpress SARS-CoV-2/FLU/RSV assay is intended as an aid in  the diagnosis of influenza from Nasopharyngeal swab specimens and  should not be used as a sole basis for treatment. Nasal washings and  aspirates are unacceptable for Xpert Xpress SARS-CoV-2/FLU/RSV  testing. Fact Sheet for Patients: PinkCheek.be Fact Sheet for Healthcare Providers: GravelBags.it This test is not yet approved or cleared by the [place] FDA and  has been authorized for detection and/or diagnosis of SARS-CoV-2 by  FDA under an Emergency Use Authorization (EUA). This EUA will remain  in effect (meaning this test can be used) for the duration of the  Covid-19 declaration under Section 564(b)(1) of the Act, 21  U.S.C. section 360bbb-3(b)(1), unless the authorization is  terminated or revoked. Performed at [facility], [address] [place], [place] [address]   Blood Culture (routine x 2)     Status: Abnormal   Collection Time: 03/19/19 12:36 AM   Specimen: BLOOD  Result Value Ref Range Status   Specimen Description   Final    BLOOD LEFT ANTECUBITAL Performed at [facility], [address] [place], [place] [address]    Special Requests   Final    BOTTLES DRAWN  AEROBIC AND ANAEROBIC Blood Culture adequate volume Performed at [facility], [place], [place] [address]    Culture  Setup Time   Final    GRAM POSITIVE COCCI IN BOTH AEROBIC AND ANAEROBIC BOTTLES CRITICAL RESULT CALLED TO, READ BACK BY AND VERIFIED WITH: ALEX CHAPPELL 03/19/19 @ [address] [place]    Culture METHICILLIN RESISTANT STAPHYLOCOCCUS AUREUS (A)  Final   Report Status 03/21/2019 FINAL  Final   Organism ID, Bacteria METHICILLIN RESISTANT STAPHYLOCOCCUS AUREUS  Final      Susceptibility   Methicillin resistant staphylococcus aureus - MIC*    CIPROFLOXACIN >=8 RESISTANT Resistant     ERYTHROMYCIN >=8 RESISTANT Resistant     GENTAMICIN <=0.5 SENSITIVE Sensitive     OXACILLIN >=4 RESISTANT Resistant     TETRACYCLINE <=1 SENSITIVE Sensitive     VANCOMYCIN <=0.5 SENSITIVE Sensitive     TRIMETH/SULFA <=10 SENSITIVE Sensitive     CLINDAMYCIN <=0.25 SENSITIVE Sensitive     RIFAMPIN <=0.5 SENSITIVE Sensitive     Inducible Clindamycin NEGATIVE Sensitive     * METHICILLIN RESISTANT STAPHYLOCOCCUS AUREUS  MRSA PCR Screening     Status: Abnormal   Collection Time: 03/19/19 12:25 PM   Specimen: Urine, Clean Catch; Nasopharyngeal  Result Value Ref Range Status   MRSA by PCR POSITIVE (A) NEGATIVE Final    Comment:        The GeneXpert MRSA Assay (FDA approved for NASAL specimens only), is one component of a comprehensive MRSA colonization surveillance program. It is not intended to diagnose MRSA infection nor to guide or monitor treatment for MRSA infections. RESULT CALLED TO, READ BACK BY AND VERIFIED WITH: DENISE [facility] AT [id number] 03/19/19.PMF Performed at [facility], [address] [place], [place] [address]   CULTURE, BLOOD (ROUTINE X 2) w Reflex to ID Panel     Status: None (Preliminary result)   Collection Time: 03/20/19  5:40 PM   Specimen: BLOOD  Result Value Ref Range Status  Specimen Description BLOOD LEFT ANTECUBITAL  Final   Special  Requests   Final    BOTTLES DRAWN AEROBIC AND ANAEROBIC Blood Culture adequate volume   Culture   Final    NO GROWTH 4 DAYS Performed at [facility], [address] [place], [place] [address]    Report Status PENDING  Incomplete  CULTURE, BLOOD (ROUTINE X 2) w Reflex to ID Panel     Status: None (Preliminary result)   Collection Time: 03/20/19  5:40 PM   Specimen: BLOOD  Result Value Ref Range Status   Specimen Description BLOOD BLOOD LEFT HAND  Final   Special Requests   Final    BOTTLES DRAWN AEROBIC AND ANAEROBIC Blood Culture adequate volume   Culture   Final    NO GROWTH 4 DAYS Performed at [facility], [address], [place], [place] [address]    Report Status PENDING  Incomplete     Radiology Studies: CT CERVICAL SPINE WO CONTRAST  Result Date: 03/23/2019 CLINICAL DATA:  Sepsis and lower extremity edema EXAM: CT CERVICAL SPINE WITHOUT CONTRAST TECHNIQUE: Multidetector CT imaging of the cervical spine was performed without intravenous contrast. Multiplanar CT image reconstructions were also generated. COMPARISON:  09/08/2018 FINDINGS: Alignment: Grade 1 anterolisthesis at C3-4 and C4-5. Reversal of normal cervical lordosis may be positional or due to muscle spasm. Skull base and vertebrae: No acute fracture. Vertebral body heights are maintained. Soft tissues and spinal canal: No prevertebral fluid or swelling. No visible canal hematoma. Disc levels:  No bony spinal canal stenosis. Upper chest: Bilateral pleural effusions. Other: None IMPRESSION: 1. No acute fracture or static subluxation of the cervical spine. 2. Bilateral pleural effusions. Electronically Signed   By: Ulyses Jarred M.D.   On: 03/23/2019 20:55   CT LUMBAR SPINE WO CONTRAST  Result Date: 03/23/2019 CLINICAL DATA:  Sepsis EXAM: CT LUMBAR SPINE WITHOUT CONTRAST TECHNIQUE: Multidetector CT imaging of the lumbar spine was performed without intravenous contrast administration. Multiplanar CT image  reconstructions were also generated. COMPARISON:  CT myelogram 09/10/2004 FINDINGS: Segmentation: Normal Alignment: Grade 1 retrolisthesis at L2-3 and L3-4. Vertebrae: Unchanged chronic compression deformity of L2. L4-S1 posterior instrumented fusion. Paraspinal and other soft tissues: Calcific aortic atherosclerosis and bilateral pleural effusions. Horseshoe kidney. Disc levels: L2-3: Mild spinal canal stenosis due to combination of endplate spurring and disc bulge. Moderate facet hypertrophy. Neural foramina are patent. L3-4: Mild spinal canal stenosis due to disc bulge and facet hypertrophy. No stenosis. L4-5: Status post right laminectomy. No spinal canal or neural foraminal stenosis. L5-S1: Posterior decompression. No spinal canal stenosis. Moderate right and mild left neural foraminal stenosis, unchanged. IMPRESSION: 1. No acute fracture or static subluxation of the lumbar spine. Status post L4-S1 posterior instrumented fusion. No hardware adverse features. 2. Unchanged moderate right and mild left L5-S1 neural foraminal stenosis. 3. Mild L2-3 and L3-4 spinal canal stenosis. 4. Aortic Atherosclerosis (ICD10-I70.0). Electronically Signed   By: Ulyses Jarred M.D.   On: 03/23/2019 21:10   DG Chest Port 1 View  Result Date: 03/22/2019 CLINICAL DATA:  PICC placement EXAM: PORTABLE CHEST 1 VIEW COMPARISON:  03/18/2019 chest radiograph. FINDINGS: Left PICC terminates at the cavoatrial junction. Stable cardiomediastinal silhouette with mild cardiomegaly. No pneumothorax. Small bilateral pleural effusions, stable. Patchy hazy and linear interstitial opacities throughout both lungs, similar. IMPRESSION: 1. Left PICC terminates at the cavoatrial junction. 2. Stable small bilateral pleural effusions. 3. Mild cardiomegaly. Patchy hazy and linear interstitial opacities throughout both lungs, similar, either pulmonary edema  or atypical pneumonia. Electronically Signed   By: Ilona Sorrel M.D.   On: 03/22/2019 18:17     Scheduled Meds: . aspirin EC  81 mg Oral Daily  . Chlorhexidine Gluconate Cloth  6 each Topical Daily  . dabigatran  150 mg Oral Q12H  . fluocinonide ointment   Topical BID  . fluticasone  2 spray Each Nare Daily  . folic acid  1 mg Oral Daily  . furosemide  40 mg Oral Daily  . gabapentin  100 mg Oral BID  . insulin aspart  0-9 Units Subcutaneous TID PC & HS  . levETIRAcetam  750 mg Oral BID  . metoprolol succinate  50 mg Oral Daily  . mupirocin ointment   Topical BID  . nystatin-triamcinolone   Topical BID  . omega-3 acid ethyl esters  1 g Oral Daily  . phenytoin  400 mg Oral QHS  . polyethylene glycol  17 g Oral Daily  . rosuvastatin  20 mg Oral Daily  . sertraline  75 mg Oral Daily  . sodium chloride flush  10-40 mL Intracatheter Q12H  . sodium chloride flush  10-40 mL Intracatheter Q12H  . triamcinolone ointment   Topical TID   Continuous Infusions: . vancomycin 1,000 mg (03/24/19 1626)     LOS: 5 days  Disposition: Expect d/c in next 48 hours if Brooke Baker is clinically  Ready and imaging is pending. Time spent: 45 minutes.  Para Skeans, MD [facility]  If 7PM-7AM, please contact night-coverage [email or web address]  03/24/2019, 6:04 PM   This record has been created using Systems analyst. Errors have been sought and corrected,but may not always be located. Such creation errors do not reflect on the standard of care.

## 2019-03-24 NOTE — TOC Progression Note (Signed)
Transition of Care [facility]) - Progression Note    Patient Details  Name: Brooke Baker MRN: [id number] Date of Birth: 11-06-1934  Transition of Care [facility]) CM/SW [place], RN Phone Number: 03/24/2019, 9:18 AM  Clinical Narrative:      PT reached out, patient may be interested in SNF placement.  Left voicemail for Daughter to return call.    Expected Discharge Plan: [place] Barriers to Discharge: Continued Medical Work up  Expected Discharge Plan and Services Expected Discharge Plan: [place] In-house Referral: Clinical Social Work Discharge Planning Services: CM Consult Post Acute Care Choice: [place] arrangements for the past 2 months: Single Family Home                 DME Arranged: N/A DME Agency: NA       HH Arranged: NA HH Agency: NA         Social Determinants of Health (SDOH) Interventions    Readmission Risk Interventions No flowsheet data found.

## 2019-03-24 NOTE — TOC Progression Note (Signed)
Transition of Care [facility]) - Progression Note    Patient Details  Name: Brooke Baker MRN: [id number] Date of Birth: 1934-11-16  Transition of Care [facility]) CM/SW [place], RN Phone Number: 03/24/2019, 3:18 PM  Clinical Narrative:     Authorization started with [employer].  Ref # [id number], expected discharge date is 03/25/19.  Repeat Covid test ordered today.    Expected Discharge Plan: [place] Barriers to Discharge: Continued Medical Work up  Expected Discharge Plan and Services Expected Discharge Plan: [place] In-house Referral: Clinical Social Work Discharge Planning Services: CM Consult Post Acute Care Choice: [place] arrangements for the past 2 months: Single Family Home                 DME Arranged: N/A DME Agency: NA       HH Arranged: NA HH Agency: NA         Social Determinants of Health (SDOH) Interventions    Readmission Risk Interventions No flowsheet data found.

## 2019-03-25 ENCOUNTER — Inpatient Hospital Stay: Payer: Medicare PPO

## 2019-03-25 LAB — CULTURE, BLOOD (ROUTINE X 2)
Culture: NO GROWTH
Culture: NO GROWTH
Special Requests: ADEQUATE
Special Requests: ADEQUATE

## 2019-03-25 LAB — GLUCOSE, CAPILLARY
Glucose-Capillary: 89 mg/dL (ref 70–99)
Glucose-Capillary: 92 mg/dL (ref 70–99)
Glucose-Capillary: 148 mg/dL — ABNORMAL HIGH (ref 70–99)
Glucose-Capillary: 157 mg/dL — ABNORMAL HIGH (ref 70–99)

## 2019-03-25 LAB — PHOSPHORUS: Phosphorus: 2 mg/dL — ABNORMAL LOW (ref 2.5–4.6)

## 2019-03-25 LAB — MAGNESIUM: Magnesium: 1.5 mg/dL — ABNORMAL LOW (ref 1.7–2.4)

## 2019-03-25 MED ORDER — MAGNESIUM SULFATE 2 GM/50ML IV SOLN
2.0000 g | Freq: Once | INTRAVENOUS | Status: AC
  Administered 2019-03-25: 2 g via INTRAVENOUS
  Filled 2019-03-25: qty 50

## 2019-03-25 MED ORDER — POTASSIUM PHOSPHATES 15 MMOLE/5ML IV SOLN
15.0000 mmol | Freq: Once | INTRAVENOUS | Status: AC
  Administered 2019-03-25: 15 mmol via INTRAVENOUS
  Filled 2019-03-25: qty 5

## 2019-03-25 NOTE — Plan of Care (Signed)
  Problem: Education: Goal: Knowledge of General Education information will improve Description: Including pain rating scale, medication(s)/side effects and non-pharmacologic comfort measures Outcome: Progressing   Problem: Pain Managment: Goal: General experience of comfort will improve Outcome: Progressing   

## 2019-03-25 NOTE — TOC Progression Note (Addendum)
Transition of Care [facility]) - Progression Note    Patient Details  Name: TOMEKIA BISKNER MRN: [id number] Date of Birth: 08/13/1934  [facility]) CM/SW Contact  Marshell Garfinkel, RN Phone Number: 03/25/2019, 11:35 AM  Clinical Narrative:    Re-faxed authorization to [facility]- fax 3/19 "failed". Number verified and corrected. Authorization pending for SNF at this time. Update at 1230P: RNCM spoke with [employer] and confirmed auth still pending for SNF; per Otila Kluver with [facility], patient will need updated COvid test- nurse notified- correction- MD notified test ordered.   Expected Discharge Plan: [place] Barriers to Discharge: Continued Medical Work up  Expected Discharge Plan and Services Expected Discharge Plan: [place] In-house Referral: Clinical Social Work Discharge Planning Services: CM Consult Post Acute Care Choice: [place] arrangements for the past 2 months: Single Family Home                 DME Arranged: N/A DME Agency: NA       HH Arranged: NA HH Agency: NA         Social Determinants of Health (SDOH) Interventions    Readmission Risk Interventions No flowsheet data found.

## 2019-03-25 NOTE — Progress Notes (Signed)
PROGRESS NOTE    Brooke Baker  [id number] DOB: Feb 07, 1934 DOA: 03/18/2019 PCP: Einar Pheasant, MD   Brief Narrative:  Brooke Baker including COPD, type diabetes mellitus, hypertension, dyslipidemia and coronary artery disease, who presented to the emergency room with acute onset of fever with associated generalized weakness and dyspnea without significant cough and wheezing. She has been having orthopnea and paroxysmal nocturnal dyspnea as well as worsening lower extremity edema. She admits to dyspnea on exertion. She admitted to nausea and vomiting without abdominal pain.  On presentation febrile, chest x-ray with bilateral interstitial densities concerning for disease progression versus superimposed pneumonia. She was started on ceftriaxone and azithromycin. Blood cultures are growing staph aureus.  Antibiotics switched with vancomycin.  03/22/19 Patient was feeling little better when seen today.  No new complaints. Labs shows anemia with hemoglobin of 7.0 and  Her pradaxa has been held for her chronic a.fib.  Oncology is on board as is GI and cardiology. Pt is awake,alert and cooperative and oriented Denies any complaints of chest pain or sob.  03/23/19: Pt seen today and is alert and awake and denies any complaints. Ros is o/w negative. Hb is 8.6 post 2 unit prbc.  Ct imaging pending.  3/19 Pt seen today and she is alert/awake d/w her about need for rehab and she understands.  States she feel good otherwise , d/w her about restarting anticoagulation for her a.fib.  And reason we held uit due to her platelet and anemia.  03/25/19 Pt is alert/awake and oriented and denies any complaints or pain and is waiting for insurance authorization for discharge. Labs are pending , magnesium is low and will replace.    Assessment & Plan:   Active Problems:   AF (paroxysmal  atrial fibrillation) (HCC)   Macrocytic anemia   Chronic respiratory failure ([place])   Sepsis ([place])   Community acquired pneumonia  Sepsis secondary to MRSA bacteremia and bilateral pneumonia.  TTE and TEE was negative for vegetations.  Patient has hardware due to right hip and knee replacement and high risk for seeding.  Blood culture drawn on 03/20/19 remain negative so far.  ID was consulted-recommending at least 4-week of vancomycin. -PICC line will be placed today -ID is also recommending CT spine before discharge to rule out any osteomyelitis or discitis which was done. -PT/OT are recommending SNF placement but patient wants to go home with home health services. -Continue supportive care with DuoNeb. -Will reorder chest to document resolution.  Acute on chronic diastolic CHF, possibly cor pulmonale.  She has a history of preserved EF of 55% with NYHA class II- III ACC/AHA stage C. Patient is on 2 L at home. She was initially diuresed with Lasix 40 mg twice daily, which was discontinued due to increased in creatinine. -Start her on p.o. Lasix 40 mg daily from tomorrow. -Continue to monitor. -Continue daily BMP, strict intake and output and daily weight. -Echocardiogram-with normal EF and no wall motion abnormalities.  Paroxysmal atrial fibrillation.  Currently regular and rate controlled. -Continue Toprol-XL and Pradaxa restarted -Cardiology is following and we appreciate their recommendations.  Hypertension.   -Blood pressure within goal. -Continue home antihypertensives.  Macrocytic anemia.  Hemoglobin stable.  Patient follow-up with hematology.  She has rheumatoid factor positive psoriatic arthritis.  Labs were consistent with hypoproliferative bone marrow.  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  They were planning to do a bone marrow evaluation if hemoglobin continued  to drop, the time of prior hematology visit her hemoglobin was 9.7.   Hb s/p transfusion is stable at 8.5 yesterday,  today labs are pending.  Pancytopenia/Thrombocytopenia.  Worsening thrombocytopenia with platelet of 71.  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  Patient is also on Pradaxa which increased her chances of bleeding. Discussed with Dr. Janese Banks who saw the patient and recommending continuation of Pradaxa at this time.  Pradaxa need to be discontinued if platelets drop Baker 50,000. -We will discontinue pradaxa due to anemia -Continue to monitor platelets. -Monitor for any sign of bleeding. -suspect pancytopenia could also be due to combination of antiseizure meds.    History of seizure disorder.  No acute concern. -We will continue home antiseizure meds.  Dyslipidemia. -Continue statin.  Depression. -Continue Zoloft.  Objective: Vitals:   03/24/19 1929 03/25/19 0613 03/25/19 0957 03/25/19 1303  BP: 111/61 (!) 120/55 (!) 122/59 (!) 127/58  Pulse: 78 86 97 92  Resp: 19 20 16 16   Temp: 97.7 F (36.5 C) 97.8 F (36.6 C) 97.9 F (36.6 C) 98.1 F (36.7 C)  TempSrc: Oral Oral Oral Oral  SpO2: 99% 100% 100% 100%  Weight:      Height:        Intake/Output Summary (Last 24 hours) at 03/25/2019 1708 Last data filed at 03/25/2019 [id number] Gross per 24 hour  Intake --  Output 1300 ml  Net -1300 ml   Filed Weights   03/21/19 0547  Weight: 108.2 kg    Examination:  General exam: Appears calm and comfortable  Respiratory system: Clear to auscultation. Respiratory effort normal. Cardiovascular system: S1 & S2 heard, RRR. No JVD, murmurs, rubs, gallops or clicks. Gastrointestinal system: Soft, nontender, nondistended, bowel sounds positive. Central nervous system: Alert and oriented. No focal neurological deficits.Symmetric 5 x 5 power. Extremities: 1+LE edema with signs of chronic venous dermatitis, 2+ upper extremity edema, worse on left with ecchymosis,pulses intact and symmetrical. Psychiatry: Judgement  and insight appear normal. Mood & affect appropriate.   Consultants:   Hematology  Cardiology  ID  Antimicrobials:  Vancomycin  Data Reviewed: I have personally reviewed following labs and imaging studies  CBC: Recent Labs  Lab 03/19/19 0036 03/19/19 0812 03/20/19 0450 03/20/19 0450 03/21/19 0643 03/21/19 0643 03/21/19 2220 03/22/19 0837 03/22/19 2004 03/23/19 0500 03/24/19 0500  WBC 5.4   < > 5.0  --  4.7  --   --  3.7*  --  5.2 5.2  NEUTROABS 4.6  --   --   --  3.2  --   --  2.3  --  2.9 2.9  HGB 8.8*   < > 8.1*   < > 7.2*   < > 7.6* 7.0* 8.4* 8.6* 8.5*  HCT 28.2*   < > 25.3*   < > 22.5*  --   --  21.7* 26.8* 26.6* 26.4*  MCV 109.3*   < > 107.2*  --  109.2*  --   --  108.5*  --  104.7* 105.2*  PLT 83*   < > 68*  --  71*  --   --  71*  --  90* 114*   < > = values in this interval not displayed.   Basic Metabolic Panel: Recent Labs  Lab 03/20/19 0450 03/20/19 0450 03/21/19 0643 03/22/19 0837 03/23/19 0500 03/24/19 0500 03/24/19 1235 03/25/19 1312  NA 133*  --  136 136 135 136  --   --  K 4.4   < > 3.7 3.6 3.5 3.9 4.1  --   CL 105  --  107 109 107 105  --   --   CO2 20*  --  23 20* 25 25  --   --   GLUCOSE 117*  --  95 92 90 96  --   --   BUN 26*  --  32* 29* 24* 21  --   --   CREATININE 1.24*  --  1.36* 1.09* 0.99 0.90  0.84  --   --   CALCIUM 7.4*  --  7.5* 7.5* 7.4* 7.4*  --   --   MG  --   --   --   --   --  1.2*  --  1.5*  PHOS  --   --   --   --   --  1.8*  --  2.0*   < > = values in this interval not displayed.   GFR: Estimated Creatinine Clearance: 64.3 mL/min (by C-G formula based on SCr of 0.84 mg/dL). Liver Function Tests: Recent Labs  Lab 03/19/19 0036 03/24/19 0500  AST 31 39  ALT 10 12  ALKPHOS 108 82  BILITOT 1.5* 1.3*  PROT 7.4 6.1*  ALBUMIN 2.4* 1.9*   Recent Labs  Lab 03/19/19 0036  LIPASE 21   No results for input(s): AMMONIA in the last 168 hours. Coagulation Profile: Recent Labs  Lab 03/19/19 0036 03/21/19 0643    INR 1.7* 2.1*   Cardiac Enzymes: No results for input(s): CKTOTAL, CKMB, CKMBINDEX, TROPONINI in the last 168 hours. BNP (last 3 results) No results for input(s): PROBNP in the last 8760 hours. HbA1C: No results for input(s): HGBA1C in the last 72 hours. CBG: Recent Labs  Lab 03/24/19 1211 03/24/19 1629 03/24/19 2122 03/25/19 0746 03/25/19 1204  GLUCAP 101* 91 117* 89 92   Lipid Profile: No results for input(s): CHOL, HDL, LDLCALC, TRIG, CHOLHDL, LDLDIRECT in the last 72 hours. Thyroid Function Tests: No results for input(s): TSH, T4TOTAL, FREET4, T3FREE, THYROIDAB in the last 72 hours. Anemia Panel: No results for input(s): VITAMINB12, FOLATE, FERRITIN, TIBC, IRON, RETICCTPCT in the last 72 hours. Sepsis Labs: Recent Labs  Lab 03/19/19 0036 03/19/19 0812  PROCALCITON 0.44  --   LATICACIDVEN 1.7 1.7    Recent Results (from the past 240 hour(s))  Respiratory Panel by RT PCR (Flu A&B, Covid) - Nasopharyngeal Swab     Status: None   Collection Time: 03/19/19 12:32 AM   Specimen: Nasopharyngeal Swab  Result Value Ref Range Status   SARS Coronavirus 2 by RT PCR NEGATIVE NEGATIVE Final    Comment: (NOTE) SARS-CoV-2 target nucleic acids are NOT DETECTED. The SARS-CoV-2 RNA is generally detectable in upper respiratoy specimens during the acute phase of infection. The lowest concentration of SARS-CoV-2 viral copies this assay can detect is 131 copies/mL. A negative result does not preclude SARS-Cov-2 infection and should not be used as the sole basis for treatment or other patient management decisions. A negative result may occur with  improper specimen collection/handling, submission of specimen other than nasopharyngeal swab, presence of viral mutation(s) within the areas targeted by this assay, and inadequate number of viral copies (<131 copies/mL). A negative result must be combined with clinical observations, patient history, and epidemiological information.  The expected result is Negative. Fact Sheet for Patients:  PinkCheek.be Fact Sheet for Healthcare Providers:  GravelBags.it This test is not yet ap proved or cleared by the [place]  FDA and  has been authorized for detection and/or diagnosis of SARS-CoV-2 by FDA under an Emergency Use Authorization (EUA). This EUA will remain  in effect (meaning this test can be used) for the duration of the COVID-19 declaration under Section 564(b)(1) of the Act, 21 U.S.C. section 360bbb-3(b)(1), unless the authorization is terminated or revoked sooner.    Influenza A by PCR NEGATIVE NEGATIVE Final   Influenza B by PCR NEGATIVE NEGATIVE Final    Comment: (NOTE) The Xpert Xpress SARS-CoV-2/FLU/RSV assay is intended as an aid in  the diagnosis of influenza from Nasopharyngeal swab specimens and  should not be used as a sole basis for treatment. Nasal washings and  aspirates are unacceptable for Xpert Xpress SARS-CoV-2/FLU/RSV  testing. Fact Sheet for Patients: PinkCheek.be Fact Sheet for Healthcare Providers: GravelBags.it This test is not yet approved or cleared by the [place] FDA and  has been authorized for detection and/or diagnosis of SARS-CoV-2 by  FDA under an Emergency Use Authorization (EUA). This EUA will remain  in effect (meaning this test can be used) for the duration of the  Covid-19 declaration under Section 564(b)(1) of the Act, 21  U.S.C. section 360bbb-3(b)(1), unless the authorization is  terminated or revoked. Performed at [facility], [address] [place], [place] [address]   Blood Culture (routine x 2)     Status: Abnormal   Collection Time: 03/19/19 12:36 AM   Specimen: BLOOD  Result Value Ref Range Status   Specimen Description   Final    BLOOD LEFT ANTECUBITAL Performed at [facility], [address] [place],  [place] [address]    Special Requests   Final    BOTTLES DRAWN AEROBIC AND ANAEROBIC Blood Culture adequate volume Performed at [facility], [place] [address], [place] [address]    Culture  Setup Time   Final    GRAM POSITIVE COCCI IN BOTH AEROBIC AND ANAEROBIC BOTTLES CRITICAL RESULT CALLED TO, READ BACK BY AND VERIFIED WITH: ALEX CHAPPELL 03/19/19 @ 73 Nitro    Culture METHICILLIN RESISTANT STAPHYLOCOCCUS AUREUS (A)  Final   Report Status 03/21/2019 FINAL  Final   Organism ID, Bacteria METHICILLIN RESISTANT STAPHYLOCOCCUS AUREUS  Final      Susceptibility   Methicillin resistant staphylococcus aureus - MIC*    CIPROFLOXACIN >=8 RESISTANT Resistant     ERYTHROMYCIN >=8 RESISTANT Resistant     GENTAMICIN <=0.5 SENSITIVE Sensitive     OXACILLIN >=4 RESISTANT Resistant     TETRACYCLINE <=1 SENSITIVE Sensitive     VANCOMYCIN <=0.5 SENSITIVE Sensitive     TRIMETH/SULFA <=10 SENSITIVE Sensitive     CLINDAMYCIN <=0.25 SENSITIVE Sensitive     RIFAMPIN <=0.5 SENSITIVE Sensitive     Inducible Clindamycin NEGATIVE Sensitive     * METHICILLIN RESISTANT STAPHYLOCOCCUS AUREUS  MRSA PCR Screening     Status: Abnormal   Collection Time: 03/19/19 12:25 PM   Specimen: Urine, Clean Catch; Nasopharyngeal  Result Value Ref Range Status   MRSA by PCR POSITIVE (A) NEGATIVE Final    Comment:        The GeneXpert MRSA Assay (FDA approved for NASAL specimens only), is one component of a comprehensive MRSA colonization surveillance program. It is not intended to diagnose MRSA infection nor to guide or monitor treatment for MRSA infections. RESULT CALLED TO, READ BACK BY AND VERIFIED WITH: DENISE [facility] AT [id number] 03/19/19.PMF Performed at [name], [address] [place],  [address]   CULTURE, BLOOD (ROUTINE X  2) w Reflex to ID Panel     Status: None   Collection Time: 03/20/19  5:40 PM   Specimen: BLOOD  Result Value Ref Range Status   Specimen Description BLOOD LEFT  ANTECUBITAL  Final   Special Requests   Final    BOTTLES DRAWN AEROBIC AND ANAEROBIC Blood Culture adequate volume   Culture   Final    NO GROWTH 5 DAYS Performed at [facility], [address] [place], [place] [address]    Report Status 03/25/2019 FINAL  Final  CULTURE, BLOOD (ROUTINE X 2) w Reflex to ID Panel     Status: None   Collection Time: 03/20/19  5:40 PM   Specimen: BLOOD  Result Value Ref Range Status   Specimen Description BLOOD BLOOD LEFT HAND  Final   Special Requests   Final    BOTTLES DRAWN AEROBIC AND ANAEROBIC Blood Culture adequate volume   Culture   Final    NO GROWTH 5 DAYS Performed at [facility], [address] [place], [place] [address]    Report Status 03/25/2019 FINAL  Final     Radiology Studies: CT CERVICAL SPINE WO CONTRAST  Result Date: 03/23/2019 CLINICAL DATA:  Sepsis and lower extremity edema EXAM: CT CERVICAL SPINE WITHOUT CONTRAST TECHNIQUE: Multidetector CT imaging of the cervical spine was performed without intravenous contrast. Multiplanar CT image reconstructions were also generated. COMPARISON:  09/08/2018 FINDINGS: Alignment: Grade 1 anterolisthesis at C3-4 and C4-5. Reversal of normal cervical lordosis may be positional or due to muscle spasm. Skull base and vertebrae: No acute fracture. Vertebral body heights are maintained. Soft tissues and spinal canal: No prevertebral fluid or swelling. No visible canal hematoma. Disc levels:  No bony spinal canal stenosis. Upper chest: Bilateral pleural effusions. Other: None IMPRESSION: 1. No acute fracture or static subluxation of the cervical spine. 2. Bilateral pleural effusions. Electronically Signed   By: Ulyses Jarred M.D.   On: 03/23/2019 20:55   CT LUMBAR SPINE WO CONTRAST  Result Date: 03/23/2019 CLINICAL DATA:  Sepsis EXAM: CT LUMBAR SPINE WITHOUT CONTRAST TECHNIQUE: Multidetector CT imaging of the lumbar spine was performed without intravenous contrast administration.  Multiplanar CT image reconstructions were also generated. COMPARISON:  CT myelogram 09/10/2004 FINDINGS: Segmentation: Normal Alignment: Grade 1 retrolisthesis at L2-3 and L3-4. Vertebrae: Unchanged chronic compression deformity of L2. L4-S1 posterior instrumented fusion. Paraspinal and other soft tissues: Calcific aortic atherosclerosis and bilateral pleural effusions. Horseshoe kidney. Disc levels: L2-3: Mild spinal canal stenosis due to combination of endplate spurring and disc bulge. Moderate facet hypertrophy. Neural foramina are patent. L3-4: Mild spinal canal stenosis due to disc bulge and facet hypertrophy. No stenosis. L4-5: Status post right laminectomy. No spinal canal or neural foraminal stenosis. L5-S1: Posterior decompression. No spinal canal stenosis. Moderate right and mild left neural foraminal stenosis, unchanged. IMPRESSION: 1. No acute fracture or static subluxation of the lumbar spine. Status post L4-S1 posterior instrumented fusion. No hardware adverse features. 2. Unchanged moderate right and mild left L5-S1 neural foraminal stenosis. 3. Mild L2-3 and L3-4 spinal canal stenosis. 4. Aortic Atherosclerosis (ICD10-I70.0). Electronically Signed   By: Ulyses Jarred M.D.   On: 03/23/2019 21:10    Scheduled Meds: . aspirin EC  81 mg Oral Daily  . Chlorhexidine Gluconate Cloth  6 each Topical Daily  . dabigatran  150 mg Oral Q12H  . fluocinonide ointment   Topical BID  . fluticasone  2 spray Each Nare Daily  . folic acid  1 mg Oral  Daily  . furosemide  40 mg Oral Daily  . gabapentin  100 mg Oral BID  . insulin aspart  0-9 Units Subcutaneous TID PC & HS  . levETIRAcetam  750 mg Oral BID  . metoprolol succinate  50 mg Oral Daily  . mupirocin ointment   Topical BID  . nystatin-triamcinolone   Topical BID  . omega-3 acid ethyl esters  1 g Oral Daily  . phenytoin  400 mg Oral QHS  . polyethylene glycol  17 g Oral Daily  . rosuvastatin  20 mg Oral Daily  . sertraline  75 mg Oral Daily    . sodium chloride flush  10-40 mL Intracatheter Q12H  . sodium chloride flush  10-40 mL Intracatheter Q12H  . triamcinolone ointment   Topical TID   Continuous Infusions: . magnesium sulfate bolus IVPB    . potassium PHOSPHATE IVPB (in mmol)    . vancomycin 1,000 mg (03/25/19 1657)     LOS: 6 days  Disposition: Expect d/c in next 48 hours if pt is clinically  Ready and imaging is pending. Time spent: 45 minutes.  Para Skeans, MD [facility]  If 7PM-7AM, please contact night-coverage [email or web address]  03/25/2019, 5:08 PM   This record has been created using Systems analyst. Errors have been sought and corrected,but may not always be located. Such creation errors do not reflect on the standard of care.

## 2019-03-25 NOTE — Consult Note (Signed)
PHARMACY CONSULT NOTE - FOLLOW UP  Pharmacy Consult for Electrolyte Monitoring and Replacement   Recent Labs: Potassium (mmol/L)  Date Value  03/24/2019 4.1  05/17/2012 3.4 (L)   Magnesium (mg/dL)  Date Value  03/25/2019 1.5 (L)   Calcium (mg/dL)  Date Value  03/24/2019 7.4 (L)   Calcium, Total (mg/dL)  Date Value  05/17/2012 8.5   Albumin (g/dL)  Date Value  03/24/2019 1.9 (L)   Phosphorus (mg/dL)  Date Value  03/25/2019 2.0 (L)   Sodium (mmol/L)  Date Value  03/24/2019 136  05/17/2012 132 (L)     Assessment: [name], type diabetes mellitus, HTN, DLD, and CAD, who presented to the emergency room with acute onset of fever with associated generalized weakness and dyspnea without significant cough and wheezing. Pharmacy has been consulted for electrolyte management.   Phos 1.8 > 2  Magnesium 1.2 > 1.5 Potassium 4.1 (3/19)  Phosphorus and magnesium are low and no electrolyte replacement today.   Goal of Therapy:  Electrolytes WNL  Plan:  Will order magnesium 2g x1   Will order potassium phosphate IV 15 mmol x1   Will order BMP, phosphorus, and magnesium with AM labs.      Rowland Lathe ,PharmD Clinical Pharmacist 03/25/2019 4:47 PM

## 2019-03-26 ENCOUNTER — Inpatient Hospital Stay: Payer: Medicare PPO

## 2019-03-26 ENCOUNTER — Encounter: Payer: Self-pay | Admitting: Family Medicine

## 2019-03-26 LAB — BASIC METABOLIC PANEL
Anion gap: 5 (ref 5–15)
BUN: 16 mg/dL (ref 8–23)
CO2: 27 mmol/L (ref 22–32)
Calcium: 7.6 mg/dL — ABNORMAL LOW (ref 8.9–10.3)
Chloride: 103 mmol/L (ref 98–111)
Creatinine, Ser: 0.75 mg/dL (ref 0.44–1.00)
GFR calc Af Amer: >60 mL/min (ref >60)
GFR calc non Af Amer: >60 mL/min (ref >60)
Glucose, Bld: 99 mg/dL (ref 70–99)
Potassium: 4 mmol/L (ref 3.5–5.1)
Sodium: 135 mmol/L (ref 135–145)

## 2019-03-26 LAB — HEMOGLOBIN AND HEMATOCRIT, BLOOD
HCT: 28.1 % — ABNORMAL LOW (ref 36.0–46.0)
Hemoglobin: 9.1 g/dL — ABNORMAL LOW (ref 12.0–15.0)

## 2019-03-26 LAB — FOLATE: Folate: 12 ng/mL (ref >5.9)

## 2019-03-26 LAB — TSH: TSH: 5.734 u[IU]/mL — ABNORMAL HIGH (ref 0.350–4.500)

## 2019-03-26 LAB — GLUCOSE, CAPILLARY
Glucose-Capillary: 91 mg/dL (ref 70–99)
Glucose-Capillary: 101 mg/dL — ABNORMAL HIGH (ref 70–99)
Glucose-Capillary: 113 mg/dL — ABNORMAL HIGH (ref 70–99)
Glucose-Capillary: 141 mg/dL — ABNORMAL HIGH (ref 70–99)

## 2019-03-26 LAB — T4, FREE: Free T4: 0.97 ng/dL (ref 0.61–1.12)

## 2019-03-26 LAB — VITAMIN B12: Vitamin B-12: 912 pg/mL (ref 180–914)

## 2019-03-26 LAB — PHOSPHORUS: Phosphorus: 2.4 mg/dL — ABNORMAL LOW (ref 2.5–4.6)

## 2019-03-26 LAB — MAGNESIUM: Magnesium: 1.7 mg/dL (ref 1.7–2.4)

## 2019-03-26 LAB — BRAIN NATRIURETIC PEPTIDE: B Natriuretic Peptide: 293 pg/mL — ABNORMAL HIGH (ref 0.0–100.0)

## 2019-03-26 MED ORDER — POTASSIUM CHLORIDE CRYS ER 10 MEQ PO TBCR
10.0000 meq | EXTENDED_RELEASE_TABLET | Freq: Every day | ORAL | Status: DC
  Administered 2019-03-26 – 2019-03-29 (×4): 10 meq via ORAL
  Filled 2019-03-26 (×4): qty 1

## 2019-03-26 MED ORDER — IOHEXOL 350 MG/ML SOLN
75.0000 mL | Freq: Once | INTRAVENOUS | Status: AC | PRN
  Administered 2019-03-26: 75 mL via INTRAVENOUS

## 2019-03-26 MED ORDER — MAGNESIUM SULFATE 2 GM/50ML IV SOLN
2.0000 g | Freq: Once | INTRAVENOUS | Status: AC
  Administered 2019-03-26: 2 g via INTRAVENOUS
  Filled 2019-03-26: qty 50

## 2019-03-26 MED ORDER — MAGNESIUM SULFATE 50 % IJ SOLN: 1.0000 g | Freq: Once | INTRAMUSCULAR | Status: DC

## 2019-03-26 NOTE — Consult Note (Addendum)
PHARMACY CONSULT NOTE - FOLLOW UP  Pharmacy Consult for Electrolyte Monitoring and Replacement   Recent Labs: Potassium (mmol/L)  Date Value  03/26/2019 4.0  05/17/2012 3.4 (L)   Magnesium (mg/dL)  Date Value  03/26/2019 1.7   Calcium (mg/dL)  Date Value  03/26/2019 7.6 (L)   Calcium, Total (mg/dL)  Date Value  05/17/2012 8.5   Albumin (g/dL)  Date Value  03/24/2019 1.9 (L)   Phosphorus (mg/dL)  Date Value  03/26/2019 2.4 (L)   Sodium (mmol/L)  Date Value  03/26/2019 135  05/17/2012 132 (L)    Assessment: [name], type diabetes mellitus, HTN, DLD, and CAD, who presented to the emergency room with acute onset of fever with associated generalized weakness and dyspnea without significant cough and wheezing.   Pharmacy has been consulted for electrolyte management.   Goal of Therapy:  Electrolytes WNL  Plan:  Will order magnesium 2g x1   Phos moderate low - will monitor   Will resume home potassium dose of KCl 21meq qd  Will order BMP, phosphorus, and magnesium with AM labs.      Lu Duffel ,PharmD Clinical Pharmacist 03/26/2019 9:39 AM

## 2019-03-26 NOTE — Progress Notes (Signed)
PROGRESS NOTE    Brooke Baker  [id number] DOB: 06-26-1934 DOA: 03/18/2019 PCP: Einar Pheasant, MD   Brief Narrative:  [name], type diabetes mellitus, hypertension, dyslipidemia and coronary artery disease, who presented to the emergency room with acute onset of fever with associated generalized weakness and dyspnea without significant cough and wheezing. She has been having orthopnea and paroxysmal nocturnal dyspnea as well as worsening lower extremity edema. She admits to dyspnea on exertion. She admitted to nausea and vomiting without abdominal pain.  On presentation febrile, chest x-ray with bilateral interstitial densities concerning for disease progression versus superimposed pneumonia. She was started on ceftriaxone and azithromycin. Blood cultures are growing staph aureus.  Antibiotics switched with vancomycin.  03/22/19 Patient was feeling little better when seen today.  No new complaints. Labs shows anemia with hemoglobin of 7.0 and  Her pradaxa has been held for her chronic a.fib.  Oncology is on board as is GI and cardiology. Pt is awake,alert and cooperative and oriented Denies any complaints of chest pain or sob.  03/23/19: Pt seen today and is alert and awake and denies any complaints. Ros is o/w negative. Hb is 8.6 post 2 unit prbc.  Ct imaging pending.  3/19 Pt seen today and she is alert/awake d/w her about need for rehab and she understands.  States she feel good otherwise , d/w her about restarting anticoagulation for her a.fib.  And reason we held uit due to her platelet and anemia.  03/25/19 Pt is alert/awake and oriented and denies any complaints or pain and is waiting for insurance authorization for discharge. Chest xray was stable with stable infiltrates seen previously.   03/26/19 Pt seen today for f/u and she is alert awake  and oriented and says she has not been out of bed we will get PT on board again to work with her until she is discharged.  Labs are pending , magnesium is low and will replace.    Assessment & Plan:   Active Problems:   AF (paroxysmal atrial fibrillation) (HCC)   Macrocytic anemia   Chronic respiratory failure ([place])   Sepsis ([place])   Community acquired pneumonia  Sepsis secondary to MRSA bacteremia and bilateral pneumonia.  TTE and TEE was negative for vegetations.  Patient has hardware due to right hip and knee replacement and high risk for seeding.  Blood culture drawn on 03/20/19 remain negative so far.  ID was consulted-recommending at least 4-week of vancomycin. -PICC line will be placed today -ID is also recommending CT spine before discharge to rule out any osteomyelitis or discitis which was done. -PT/OT are recommending SNF placement but patient wants to go home with home health services. -Continue supportive care with DuoNeb. -Will reorder chest xray to document resolution.  Acute on chronic diastolic CHF, possibly cor pulmonale.  She has a history of preserved EF of 55% with NYHA class II- III ACC/AHA stage C. Patient is on 2 L at home. She was initially diuresed with Lasix 40 mg twice daily, which was discontinued due to increased in creatinine. -Start her on p.o. Lasix 40 mg daily from tomorrow. -Continue to monitor. -Continue daily BMP, strict intake and output and daily weight. -Echocardiogram-with normal EF and no wall motion abnormalities.  Paroxysmal atrial fibrillation.  Currently regular and rate controlled. -Continue Toprol-XL and Pradaxa restarted -Cardiology is following and we appreciate their recommendations.  Hypertension.   -Blood pressure  within goal. -Continue home antihypertensives.  Macrocytic anemia.  Hemoglobin stable.  Patient follow-up with hematology.  She has rheumatoid factor positive psoriatic arthritis.  Labs were consistent with  hypoproliferative bone marrow.  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  They were planning to do a bone marrow evaluation if hemoglobin continued to drop, the time of prior hematology visit her hemoglobin was 9.7.   Hb s/p transfusion is stable at 8.5 yesterday, today labs are pending.  Pancytopenia/Thrombocytopenia.  Worsening thrombocytopenia with platelet of 71.  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  Patient is also on Pradaxa which increased her chances of bleeding. Discussed with Dr. Janese Banks who saw the patient and recommending continuation of Pradaxa at this time.  Pradaxa need to be discontinued if platelets drop below 50,000. -We will discontinue pradaxa due to anemia -Continue to monitor platelets. -Monitor for any sign of bleeding. -suspect pancytopenia could also be due to combination of antiseizure meds.    History of seizure disorder.  No acute concern. -We will continue home antiseizure meds.  Dyslipidemia. -Continue statin.  Depression. -Continue Zoloft.  Objective: Vitals:   03/25/19 1946 03/26/19 0641 03/26/19 0937 03/26/19 0947  BP: 122/62 (!) 97/50 (!) 89/50 113/60  Pulse: 85 90 99 91  Resp: 20 18 19 19   Temp: 98.3 F (36.8 C) 98.3 F (36.8 C) 98 F (36.7 C)   TempSrc: Oral Oral Oral   SpO2: 95% 97% 98%   Weight:  116.1 kg    Height:        Intake/Output Summary (Last 24 hours) at 03/26/2019 1035 Last data filed at 03/26/2019 1000 Gross per 24 hour  Intake 394.59 ml  Output 1350 ml  Net -955.41 ml   Filed Weights   03/26/19 0641  Weight: 116.1 kg    Examination:  General exam: Appears calm and comfortable  Respiratory system: scattered exp wheezing , Respiratory effort normal. Cardiovascular system: S1 & S2 heard, RRR. No JVD, murmurs, rubs, gallops or clicks. Gastrointestinal system: Soft, nontender, nondistended, bowel sounds positive. Central nervous system: Alert and  oriented. No focal neurological deficits.Symmetric 5 x 5 power. Extremities: 1+LE edema with signs of chronic venous dermatitis, 2+ upper extremity edema, worse on left with ecchymosis,pulses intact and symmetrical. Psychiatry: Judgement and insight appear normal. Mood & affect appropriate.   Consultants:   Hematology  Cardiology  ID  PT   Data Reviewed: I have personally reviewed following labs and imaging studies  CBC: Recent Labs  Lab 03/20/19 0450 03/20/19 0450 03/21/19 0643 03/21/19 0643 03/21/19 2220 03/22/19 0837 03/22/19 2004 03/23/19 0500 03/24/19 0500  WBC 5.0  --  4.7  --   --  3.7*  --  5.2 5.2  NEUTROABS  --   --  3.2  --   --  2.3  --  2.9 2.9  HGB 8.1*   < > 7.2*   < > 7.6* 7.0* 8.4* 8.6* 8.5*  HCT 25.3*   < > 22.5*  --   --  21.7* 26.8* 26.6* 26.4*  MCV 107.2*  --  109.2*  --   --  108.5*  --  104.7* 105.2*  PLT 68*  --  71*  --   --  71*  --  90* 114*   < > = values in this interval not displayed.   Basic Metabolic Panel: Recent Labs  Lab 03/21/19 0643 03/21/19 0643 03/22/19 0837 03/23/19 0500 03/24/19 0500 03/24/19 1235 03/25/19 1312  03/26/19 0627  NA 136  --  136 135 136  --   --  135  K 3.7   < > 3.6 3.5 3.9 4.1  --  4.0  CL 107  --  109 107 105  --   --  103  CO2 23  --  20* 25 25  --   --  27  GLUCOSE 95  --  92 90 96  --   --  99  BUN 32*  --  29* 24* 21  --   --  16  CREATININE 1.36*  --  1.09* 0.99 0.90  0.84  --   --  0.75  CALCIUM 7.5*  --  7.5* 7.4* 7.4*  --   --  7.6*  MG  --   --   --   --  1.2*  --  1.5* 1.7  PHOS  --   --   --   --  1.8*  --  2.0* 2.4*   < > = values in this interval not displayed.   GFR: Estimated Creatinine Clearance: 70.2 mL/min (by C-G formula based on SCr of 0.75 mg/dL). Liver Function Tests: Recent Labs  Lab 03/24/19 0500  AST 39  ALT 12  ALKPHOS 82  BILITOT 1.3*  PROT 6.1*  ALBUMIN 1.9*   No results for input(s): LIPASE, AMYLASE in the last 168 hours. No results for input(s): AMMONIA in  the last 168 hours. Coagulation Profile: Recent Labs  Lab 03/21/19 0643  INR 2.1*   Cardiac Enzymes: No results for input(s): CKTOTAL, CKMB, CKMBINDEX, TROPONINI in the last 168 hours. BNP (last 3 results) No results for input(s): PROBNP in the last 8760 hours. HbA1C: No results for input(s): HGBA1C in the last 72 hours. CBG: Recent Labs  Lab 03/25/19 0746 03/25/19 1204 03/25/19 1716 03/25/19 2059 03/26/19 0726  GLUCAP 89 92 148* 157* 91   Lipid Profile: No results for input(s): CHOL, HDL, LDLCALC, TRIG, CHOLHDL, LDLDIRECT in the last 72 hours. Thyroid Function Tests: No results for input(s): TSH, T4TOTAL, FREET4, T3FREE, THYROIDAB in the last 72 hours. Anemia Panel: No results for input(s): VITAMINB12, FOLATE, FERRITIN, TIBC, IRON, RETICCTPCT in the last 72 hours. Sepsis Labs: No results for input(s): PROCALCITON, LATICACIDVEN in the last 168 hours.  Recent Results (from the past 240 hour(s))  Respiratory Panel by RT PCR (Flu A&B, Covid) - Nasopharyngeal Swab     Status: None   Collection Time: 03/19/19 12:32 AM   Specimen: Nasopharyngeal Swab  Result Value Ref Range Status   SARS Coronavirus 2 by RT PCR NEGATIVE NEGATIVE Final    Comment: (NOTE) SARS-CoV-2 target nucleic acids are NOT DETECTED. The SARS-CoV-2 RNA is generally detectable in upper respiratoy specimens during the acute phase of infection. The lowest concentration of SARS-CoV-2 viral copies this assay can detect is 131 copies/mL. A negative result does not preclude SARS-Cov-2 infection and should not be used as the sole basis for treatment or other patient management decisions. A negative result may occur with  improper specimen collection/handling, submission of specimen other than nasopharyngeal swab, presence of viral mutation(s) within the areas targeted by this assay, and inadequate number of viral copies (<131 copies/mL). A negative result must be combined with clinical observations, patient  history, and epidemiological information. The expected result is Negative. Fact Sheet for Patients:  PinkCheek.be Fact Sheet for Healthcare Providers:  GravelBags.it This test is not yet ap proved or cleared by the [place] and  has been authorized  for detection and/or diagnosis of SARS-CoV-2 by FDA under an Emergency Use Authorization (EUA). This EUA will remain  in effect (meaning this test can be used) for the duration of the COVID-19 declaration under Section 564(b)(1) of the Act, 21 U.S.C. section 360bbb-3(b)(1), unless the authorization is terminated or revoked sooner.    Influenza A by PCR NEGATIVE NEGATIVE Final   Influenza B by PCR NEGATIVE NEGATIVE Final    Comment: (NOTE) The Xpert Xpress SARS-CoV-2/FLU/RSV assay is intended as an aid in  the diagnosis of influenza from Nasopharyngeal swab specimens and  should not be used as a sole basis for treatment. Nasal washings and  aspirates are unacceptable for Xpert Xpress SARS-CoV-2/FLU/RSV  testing. Fact Sheet for Patients: PinkCheek.be Fact Sheet for Healthcare Providers: GravelBags.it This test is not yet approved or cleared by the [place] FDA and  has been authorized for detection and/or diagnosis of SARS-CoV-2 by  FDA under an Emergency Use Authorization (EUA). This EUA will remain  in effect (meaning this test can be used) for the duration of the  Covid-19 declaration under Section 564(b)(1) of the Act, 21  U.S.C. section 360bbb-3(b)(1), unless the authorization is  terminated or revoked. Performed at [facility], [address] [place], [place] [address]   Blood Culture (routine x 2)     Status: Abnormal   Collection Time: 03/19/19 12:36 AM   Specimen: BLOOD  Result Value Ref Range Status   Specimen Description   Final    BLOOD LEFT ANTECUBITAL Performed at [facility], [address] [place], [place] [address]    Special Requests   Final    BOTTLES DRAWN AEROBIC AND ANAEROBIC Blood Culture adequate volume Performed at [facility], [place], [place] [address]    Culture  Setup Time   Final    GRAM POSITIVE COCCI IN BOTH AEROBIC AND ANAEROBIC BOTTLES CRITICAL RESULT CALLED TO, READ BACK BY AND VERIFIED WITH: ALEX CHAPPELL 03/19/19 @ [address]    Culture METHICILLIN RESISTANT STAPHYLOCOCCUS AUREUS (A)  Final   Report Status 03/21/2019 FINAL  Final   Organism ID, Bacteria METHICILLIN RESISTANT STAPHYLOCOCCUS AUREUS  Final      Susceptibility   Methicillin resistant staphylococcus aureus - MIC*    CIPROFLOXACIN >=8 RESISTANT Resistant     ERYTHROMYCIN >=8 RESISTANT Resistant     GENTAMICIN <=0.5 SENSITIVE Sensitive     OXACILLIN >=4 RESISTANT Resistant     TETRACYCLINE <=1 SENSITIVE Sensitive     VANCOMYCIN <=0.5 SENSITIVE Sensitive     TRIMETH/SULFA <=10 SENSITIVE Sensitive     CLINDAMYCIN <=0.25 SENSITIVE Sensitive     RIFAMPIN <=0.5 SENSITIVE Sensitive     Inducible Clindamycin NEGATIVE Sensitive     * METHICILLIN RESISTANT STAPHYLOCOCCUS AUREUS  MRSA PCR Screening     Status: Abnormal   Collection Time: 03/19/19 12:25 PM   Specimen: Urine, Clean Catch; Nasopharyngeal  Result Value Ref Range Status   MRSA by PCR POSITIVE (A) NEGATIVE Final    Comment:        The GeneXpert MRSA Assay (FDA approved for NASAL specimens only), is one component of a comprehensive MRSA colonization surveillance program. It is not intended to diagnose MRSA infection nor to guide or monitor treatment for MRSA infections. RESULT CALLED TO, READ BACK BY AND VERIFIED WITH: DENISE [facility] AT [id number] 03/19/19.PMF Performed at [facility], [place], [place] [address]   CULTURE, BLOOD (ROUTINE X 2) w Reflex to ID Panel  Status: None   Collection Time: 03/20/19  5:40 PM   Specimen: BLOOD  Result Value Ref Range  Status   Specimen Description BLOOD LEFT ANTECUBITAL  Final   Special Requests   Final    BOTTLES DRAWN AEROBIC AND ANAEROBIC Blood Culture adequate volume   Culture   Final    NO GROWTH 5 DAYS Performed at [facility], [address] [place], [place] [address]    Report Status 03/25/2019 FINAL  Final  CULTURE, BLOOD (ROUTINE X 2) w Reflex to ID Panel     Status: None   Collection Time: 03/20/19  5:40 PM   Specimen: BLOOD  Result Value Ref Range Status   Specimen Description BLOOD BLOOD LEFT HAND  Final   Special Requests   Final    BOTTLES DRAWN AEROBIC AND ANAEROBIC Blood Culture adequate volume   Culture   Final    NO GROWTH 5 DAYS Performed at [facility], [address] [place], [place] [address]    Report Status 03/25/2019 FINAL  Final     Radiology Studies: [facility] Chest Port 1 View  Result Date: 03/26/2019 CLINICAL DATA:  Shortness of breath and chest pain. EXAM: PORTABLE CHEST 1 VIEW COMPARISON:  March 25, 2019 FINDINGS: Stable cardiomegaly. Diffuse interstitial opacity on the left and patchy infiltrate on the right are stable. No interval changes. IMPRESSION: No interval change in bilateral pulmonary opacities. Electronically Signed   By: Dorise Bullion III M.D   On: 03/26/2019 10:28   DG Chest Port 1 View  Result Date: 03/25/2019 CLINICAL DATA:  Community acquired pneumonia. Patient states having a cough and some sob today. Hx of bronchitis, CAD, COPD. Never smoker. EXAM: PORTABLE CHEST 1 VIEW COMPARISON:  Chest radiograph 03/22/2019 FINDINGS: Stable position of a left upper extremity PICC. Unchanged cardiomediastinal contours. There are diffuse bilateral interstitial and airspace opacities, not significantly changed. Probable small bilateral effusions. No pneumothorax. No acute finding in the visualized skeleton. IMPRESSION: Stable diffuse bilateral interstitial and airspace opacities. Electronically Signed   By: Audie Pinto M.D.   On: 03/25/2019  17:53    Scheduled Meds: . aspirin EC  81 mg Oral Daily  . Chlorhexidine Gluconate Cloth  6 each Topical Daily  . dabigatran  150 mg Oral Q12H  . fluocinonide ointment   Topical BID  . fluticasone  2 spray Each Nare Daily  . folic acid  1 mg Oral Daily  . furosemide  40 mg Oral Daily  . gabapentin  100 mg Oral BID  . insulin aspart  0-9 Units Subcutaneous TID PC & HS  . levETIRAcetam  750 mg Oral BID  . metoprolol succinate  50 mg Oral Daily  . mupirocin ointment   Topical BID  . nystatin-triamcinolone   Topical BID  . omega-3 acid ethyl esters  1 g Oral Daily  . phenytoin  400 mg Oral QHS  . polyethylene glycol  17 g Oral Daily  . potassium chloride  10 mEq Oral Daily  . rosuvastatin  20 mg Oral Daily  . sertraline  75 mg Oral Daily  . sodium chloride flush  10-40 mL Intracatheter Q12H  . sodium chloride flush  10-40 mL Intracatheter Q12H  . triamcinolone ointment   Topical TID   Continuous Infusions: . magnesium sulfate bolus IVPB    . vancomycin 1,000 mg (03/25/19 1657)     LOS: 7 days  Disposition: Expect d/c in next 48 hours if pt is clinically  Insurance authorization is pending.  Time spent: 35 minutes.  Para Skeans, MD [facility] If 7PM-7AM, please contact night-coverage. [email or web address] 03/26/2019, 10:35 AM

## 2019-03-27 ENCOUNTER — Telehealth: Payer: Self-pay | Admitting: Neurology

## 2019-03-27 LAB — BLOOD GAS, VENOUS
Acid-Base Excess: 3.6 mmol/L — ABNORMAL HIGH (ref 0.0–2.0)
Bicarbonate: 31.1 mmol/L — ABNORMAL HIGH (ref 20.0–28.0)
O2 Saturation: 77.1 %
Patient temperature: 37
pCO2, Ven: 59 mmHg (ref 44.0–60.0)
pH, Ven: 7.33 (ref 7.250–7.430)
pO2, Ven: 45 mmHg (ref 32.0–45.0)

## 2019-03-27 LAB — BASIC METABOLIC PANEL
Anion gap: 5 (ref 5–15)
BUN: 15 mg/dL (ref 8–23)
CO2: 28 mmol/L (ref 22–32)
Calcium: 7.5 mg/dL — ABNORMAL LOW (ref 8.9–10.3)
Chloride: 103 mmol/L (ref 98–111)
Creatinine, Ser: 0.77 mg/dL (ref 0.44–1.00)
GFR calc Af Amer: >60 mL/min (ref >60)
GFR calc non Af Amer: >60 mL/min (ref >60)
Glucose, Bld: 98 mg/dL (ref 70–99)
Potassium: 4 mmol/L (ref 3.5–5.1)
Sodium: 136 mmol/L (ref 135–145)

## 2019-03-27 LAB — CBC
HCT: 24.7 % — ABNORMAL LOW (ref 36.0–46.0)
Hemoglobin: 8 g/dL — ABNORMAL LOW (ref 12.0–15.0)
MCH: 34 pg (ref 26.0–34.0)
MCHC: 32.4 g/dL (ref 30.0–36.0)
MCV: 105.1 fL — ABNORMAL HIGH (ref 80.0–100.0)
Platelets: 142 10*3/uL — ABNORMAL LOW (ref 150–400)
RBC: 2.35 MIL/uL — ABNORMAL LOW (ref 3.87–5.11)
RDW: 15.2 % (ref 11.5–15.5)
WBC: 4.9 10*3/uL (ref 4.0–10.5)
nRBC: 0 % (ref 0.0–0.2)

## 2019-03-27 LAB — GLUCOSE, CAPILLARY
Glucose-Capillary: 94 mg/dL (ref 70–99)
Glucose-Capillary: 101 mg/dL — ABNORMAL HIGH (ref 70–99)
Glucose-Capillary: 114 mg/dL — ABNORMAL HIGH (ref 70–99)
Glucose-Capillary: 174 mg/dL — ABNORMAL HIGH (ref 70–99)

## 2019-03-27 LAB — BRAIN NATRIURETIC PEPTIDE: B Natriuretic Peptide: 426 pg/mL — ABNORMAL HIGH (ref 0.0–100.0)

## 2019-03-27 LAB — PHOSPHORUS: Phosphorus: 2.6 mg/dL (ref 2.5–4.6)

## 2019-03-27 LAB — RESPIRATORY PANEL BY RT PCR (FLU A&B, COVID)
Influenza A by PCR: NEGATIVE
Influenza B by PCR: NEGATIVE
SARS Coronavirus 2 by RT PCR: NEGATIVE

## 2019-03-27 LAB — PHENYTOIN LEVEL, FREE AND TOTAL
Phenytoin, Free: 1.7 ug/mL (ref 1.0–2.0)
Phenytoin, Total: 10.4 ug/mL (ref 10.0–20.0)

## 2019-03-27 LAB — MAGNESIUM: Magnesium: 1.7 mg/dL (ref 1.7–2.4)

## 2019-03-27 MED ORDER — FUROSEMIDE 10 MG/ML IJ SOLN
20.0000 mg | Freq: Two times a day (BID) | INTRAMUSCULAR | Status: DC
  Administered 2019-03-27 (×2): 20 mg via INTRAVENOUS
  Filled 2019-03-27 (×2): qty 2

## 2019-03-27 MED ORDER — VANCOMYCIN HCL IN DEXTROSE 1-5 GM/200ML-% IV SOLN
1000.0000 mg | INTRAVENOUS | Status: DC
  Administered 2019-03-27 – 2019-03-28 (×2): 1000 mg via INTRAVENOUS
  Filled 2019-03-27 (×2): qty 200

## 2019-03-27 MED ORDER — MAGNESIUM SULFATE 2 GM/50ML IV SOLN
2.0000 g | Freq: Once | INTRAVENOUS | Status: AC
  Administered 2019-03-27: 2 g via INTRAVENOUS
  Filled 2019-03-27: qty 50

## 2019-03-27 MED ORDER — SODIUM CHLORIDE 0.9 % IV SOLN
INTRAVENOUS | Status: DC | PRN
  Administered 2019-03-27: 250 mL via INTRAVENOUS

## 2019-03-27 MED ORDER — ALBUTEROL SULFATE (2.5 MG/3ML) 0.083% IN NEBU
2.5000 mg | INHALATION_SOLUTION | Freq: Four times a day (QID) | RESPIRATORY_TRACT | Status: AC
  Administered 2019-03-27 – 2019-03-28 (×3): 2.5 mg via RESPIRATORY_TRACT
  Filled 2019-03-27 (×4): qty 3

## 2019-03-27 NOTE — Progress Notes (Signed)
Occupational Therapy Treatment Patient Details Name: Brooke Baker MRN: [id number] DOB: [birth year] Today's Date: 03/27/2019    History of present illness Brooke Baker  is a 85 y.o. Caucasian female with a known history of multiple medical problems that are mentioned below including COPD, type diabetes mellitus, hypertension, dyslipidemia and coronary artery disease, who presented to the emergency room with acute onset of fever with associated generalized weakness and dyspnea without significant cough and wheezing. Found to be septic with progressive lung disease with pneumonia. Patient on chronic O2 3-4L, is ind with ADLs with RW.  Patient recently with downtrending HCT and Hgb recieving blood transfusion on 03/21/19 and 03/22/19.   OT comments  Pt seen for OT tx this date to f/u re: safety with ADLs/ADL mobility. Pt requires MIN A for sup to sit and tolerates EOB sitting >30 mins to participate in UB bathing/drying and dressing as well as breathing exercises. Pt requires back to bed for LB bathing dressing with MOD A for sit to sup to manage LEs. Pt reports feeling SOB several times during treatment, but O2 >94% each time OT checked. Pt requires MOD A for LB Bathing in side lying in bed using lateral rolling and bridging method. Pt tolerates well overall. Left in bed with call bell with bed alarm set with clean sheets and gown. Pt does report being fatigued. Anticipate SNF is still most prudent d/c recommendation.   Follow Up Recommendations  SNF    Equipment Recommendations  Other (comment)(defer to next level of care)    Recommendations for Other Services      Precautions / Restrictions Precautions Precautions: Fall;Other (comment) Precaution Comments: watch O2, pt >94% throughout session, does c/o feeling SOB Restrictions Weight Bearing Restrictions: No       Mobility Bed Mobility Overal bed mobility: Needs Assistance Bed Mobility: Supine to Sit     Supine to sit: Min  assist;HOB elevated Sit to supine: Mod assist   General bed mobility comments: MOD A for sit to sup to manage LEs  Transfers                 General transfer comment: deferred    Balance Overall balance assessment: Needs assistance;History of Falls Sitting-balance support: Feet supported;No upper extremity supported Sitting balance-Leahy Scale: Good                                     ADL either performed or assessed with clinical judgement   ADL           Upper Body Bathing: Minimal assistance;Sitting   Lower Body Bathing: Moderate assistance;Bed level Lower Body Bathing Details (indicate cue type and reason): using lateral rolling technique Upper Body Dressing : Minimal assistance;Moderate assistance;Sitting   Lower Body Dressing: Maximal assistance;Sitting/lateral leans                       Vision Patient Visual Report: No change from baseline     Perception     Praxis      Cognition Arousal/Alertness: Awake/alert Behavior During Therapy: WFL for tasks assessed/performed Overall Cognitive Status: Within Functional Limits for tasks assessed                                          Exercises Other Exercises Other Exercises:  OT facilitates education with pt re: importance of performing ADLs in sitting to allow for improved breathing. Pt verbalized understanding. Other Exercises: OT facilitates pt participation in postural extension exercise for 1 set x10 resp for 2-3 second holds to inhale/deep breathe to improve surface area for deeper/more quality breaths   Shoulder Instructions       General Comments      Pertinent Vitals/ Pain       Pain Assessment: No/denies pain  Home Living                                          Prior Functioning/Environment              Frequency  Min 1X/week        Progress Toward Goals  OT Goals(current goals can now be found in the care plan  section)  Progress towards OT goals: Progressing toward goals  Acute Rehab OT Goals Patient Stated Goal: " I want to get better so I can go home." OT Goal Formulation: With patient Time For Goal Achievement: 04/05/19 Potential to Achieve Goals: Good  Plan Discharge plan remains appropriate;Frequency remains appropriate    Co-evaluation                 AM-PAC OT "6 Clicks" Daily Activity     Outcome Measure   Help from another person eating meals?: A Little Help from another person taking care of personal grooming?: None Help from another person toileting, which includes using toliet, bedpan, or urinal?: A Lot Help from another person bathing (including washing, rinsing, drying)?: A Lot Help from another person to put on and taking off regular upper body clothing?: A Lot Help from another person to put on and taking off regular lower body clothing?: A Lot 6 Click Score: 15    End of Session    OT Visit Diagnosis: Muscle weakness (generalized) (M62.81);History of falling (Z91.81)   Activity Tolerance Other (comment)(pt limited by feeling like she needed to catch her breath)   Patient Left in bed;with call bell/phone within reach;with bed alarm set   Nurse Communication          Time: [id number] OT Time Calculation (min): 53 min  Charges: OT General Charges $OT Visit: 1 Visit OT Treatments $Self Care/Home Management : 38-52 mins $Therapeutic Activity: 8-22 mins  Gerrianne Scale, [place], OTR/L ascom [phone] 03/27/19, 5:17 PM

## 2019-03-27 NOTE — Progress Notes (Signed)
PROGRESS NOTE    Brooke Baker  [id number] DOB: January 14, 1934 DOA: 03/18/2019 PCP: Einar Pheasant, MD   Brief Narrative:  [name], type diabetes mellitus, hypertension, dyslipidemia and coronary artery disease, who presented to the emergency room with acute onset of fever with associated generalized weakness and dyspnea without significant cough and wheezing. She has been having orthopnea and paroxysmal nocturnal dyspnea as well as worsening lower extremity edema. She admits to dyspnea on exertion. She admitted to nausea and vomiting without abdominal pain.  On presentation febrile, chest x-ray with bilateral interstitial densities concerning for disease progression versus superimposed pneumonia. She was started on ceftriaxone and azithromycin. Blood cultures are growing staph aureus.  Antibiotics switched with vancomycin.  03/22/19 Patient was feeling little better when seen today.  No new complaints. Labs shows anemia with hemoglobin of 7.0 and  Her pradaxa has been held for her chronic a.fib.  Oncology is on board as is GI and cardiology. Pt is awake,alert and cooperative and oriented Denies any complaints of chest pain or sob.  03/23/19: Pt seen today and is alert and awake and denies any complaints. Ros is o/w negative. Hb is 8.6 post 2 unit prbc.  Ct imaging pending.  3/19 Pt seen today and she is alert/awake d/w her about need for rehab and she understands.  States she feel good otherwise , d/w her about restarting anticoagulation for her a.fib.  And reason we held uit due to her platelet and anemia.  03/25/19 Pt is alert/awake and oriented and denies any complaints or pain and is waiting for insurance authorization for discharge. Chest xray was stable with stable infiltrates seen previously.   03/26/19 Pt seen today for f/u and she is alert awake  and oriented and says she has not been out of bed we will get PT on board again to work with her until she is discharged.  Labs are pending , magnesium is low and will replace.   03/27/19 Blood pressure 100/87, pulse 78, temperature 98.4 F (36.9 C), temperature source Oral, resp. rate 19, height 5\' 6"  (1.676 m), weight 109.8 kg, last menstrual period 08/17/1965, SpO2 100 %.   Assessment & Plan:   Active Problems:   AF (paroxysmal atrial fibrillation) (HCC)   Macrocytic anemia   Chronic respiratory failure ([place])   Community acquired pneumonia  Sepsis secondary to MRSA bacteremia and bilateral pneumonia.  TTE and TEE was negative for vegetations.  Patient has hardware due to right hip and knee replacement and high risk for seeding.  Blood culture drawn on 03/20/19 remain negative so far.  ID was consulted-recommending at least 4-week of vancomycin. -PICC line will be placed today -ID is also recommending CT spine before discharge to rule out any osteomyelitis or discitis which was done. -PT/OT are recommending SNF placement but patient wants to go home with home health services. -Continue supportive care with DuoNeb. -Will reorder chest xray to document resolution.  Acute on chronic diastolic CHF, possibly cor pulmonale.  She has a history of preserved EF of 55% with NYHA class II- III ACC/AHA stage C. Patient is on 2 L at home. She was initially diuresed with Lasix 40 mg twice daily, which was discontinued due to increased in creatinine. -pt changed to lasix 20 mg iv q12h until specified.  -Continue to monitor. -Continue daily BMP, strict intake and output and daily weight. -Echocardiogram-with normal EF and no wall motion  abnormalities. however pt as not taking lasix for 2 days. 2 D Echo: 1. Left ventricular ejection fraction, by estimation, is 70 to 75%. Left  ventricular ejection fraction by PLAX is 75 %. The left ventricle has  hyperdynamic function. The left ventricle has no  regional wall motion  abnormalities. Left ventricular  diastolic parameters are consistent with Grade I diastolic dysfunction  (impaired relaxation).  2. Right ventricular systolic function is normal. The right ventricular  size is mildly enlarged. There is moderately elevated pulmonary artery  systolic pressure.  3. Left atrial size was mildly dilated.  4. Right atrial size was mildly dilated.  5. The mitral valve is grossly normal. Mild mitral valve regurgitation.  6. The aortic valve is grossly normal. Aortic valve regurgitation is  trivial.   Paroxysmal atrial fibrillation.  Currently regular and rate controlled. -Continue Toprol-XL and Pradaxa restarted -Cardiology is following and we appreciate their recommendations.  Hypertension.   -Blood pressure within goal. -Continue home antihypertensives.  Macrocytic anemia.  Hemoglobin stable.  Patient follow-up with hematology.  She has rheumatoid factor positive psoriatic arthritis.  Labs were consistent with hypoproliferative bone marrow.  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  They were planning to do a bone marrow evaluation if hemoglobin continued to drop, the time of prior hematology visit her hemoglobin was 9.7.  Hb s/p transfusion is stable at 8.0 yesterday, today labs are pending. We will transfuse if it goes below 8.  Pancytopenia/Thrombocytopenia.  Worsening thrombocytopenia with platelet of 71.  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  Patient is also on Pradaxa which increased her chances of bleeding. Discussed with Dr. Janese Banks who saw the patient and recommending continuation of Pradaxa at this time.  Pradaxa need to be discontinued if platelets drop below 50,000. -We will discontinue pradaxa due to anemia -Continue to monitor platelets. -Monitor for any sign of bleeding. -suspect pancytopenia could also be due to combination of antiseizure meds.     History of seizure disorder.  No acute concern. -We will continue home antiseizure meds.  Dyslipidemia. -Continue statin.  Depression. -Continue Zoloft.  Objective: Vitals:   03/27/19 1007 03/27/19 1214 03/27/19 1315 03/27/19 1615  BP: 106/67 (!) 102/36 105/70 100/87  Pulse: 96 97  78  Resp: 18 20  19   Temp: 97.9 F (36.6 C) (!) 97.4 F (36.3 C)  98.4 F (36.9 C)  TempSrc: Oral Oral  Oral  SpO2: 98% 100%  100%  Weight:      Height:        Intake/Output Summary (Last 24 hours) at 03/27/2019 1636 Last data filed at 03/27/2019 1617 Gross per 24 hour  Intake 692.18 ml  Output 1850 ml  Net -1157.82 ml   Filed Weights   03/26/19 0641 03/27/19 0313  Weight: 116.1 kg 109.8 kg    Examination: General exam: Appears calm and comfortable  Respiratory system: scattered exp wheezing , Respiratory effort normal. Cardiovascular system: S1 & S2 heard, RRR. No JVD, murmurs, rubs, gallops or clicks. Gastrointestinal system: Soft, nontender, nondistended, bowel sounds positive. Central nervous system: Alert and oriented. No focal neurological deficits.Symmetric 5 x 5 power. Extremities: 1+LE edema with signs of chronic venous dermatitis, 2+ upper extremity edema, worse on left with ecchymosis,pulses intact and symmetrical. Psychiatry: Judgement and insight appear normal. Mood & affect appropriate.   Consultants:   Hematology  Cardiology  ID  PT Data Reviewed: I have personally reviewed following labs and imaging studies  CBC: Recent Labs  Lab 03/21/19 0643 03/21/19 2220 03/22/19 0837 03/22/19 0837 03/22/19 2004 03/23/19 0500 03/24/19 0500 03/26/19 1022 03/27/19 0549  WBC 4.7  --  3.7*  --   --  5.2 5.2  --  4.9  NEUTROABS 3.2  --  2.3  --   --  2.9 2.9  --   --   HGB 7.2*   < > 7.0*   < > 8.4* 8.6* 8.5* 9.1* 8.0*  HCT 22.5*  --  21.7*   < > 26.8* 26.6* 26.4* 28.1* 24.7*  MCV 109.2*  --  108.5*  --   --  104.7* 105.2*  --  105.1*  PLT 71*  --  71*  --   --  90*  114*  --  142*   < > = values in this interval not displayed.   Basic Metabolic Panel: Recent Labs  Lab 03/22/19 0837 03/22/19 0837 03/23/19 0500 03/24/19 0500 03/24/19 1235 03/25/19 1312 03/26/19 0627 03/27/19 0549  NA 136  --  135 136  --   --  135 136  K 3.6   < > 3.5 3.9 4.1  --  4.0 4.0  CL 109  --  107 105  --   --  103 103  CO2 20*  --  25 25  --   --  27 28  GLUCOSE 92  --  90 96  --   --  99 98  BUN 29*  --  24* 21  --   --  16 15  CREATININE 1.09*  --  0.99 0.90  0.84  --   --  0.75 0.77  CALCIUM 7.5*  --  7.4* 7.4*  --   --  7.6* 7.5*  MG  --   --   --  1.2*  --  1.5* 1.7 1.7  PHOS  --   --   --  1.8*  --  2.0* 2.4* 2.6   < > = values in this interval not displayed.   GFR: Estimated Creatinine Clearance: 68 mL/min (by C-G formula based on SCr of 0.77 mg/dL). Liver Function Tests: Recent Labs  Lab 03/24/19 0500  AST 39  ALT 12  ALKPHOS 82  BILITOT 1.3*  PROT 6.1*  ALBUMIN 1.9*   No results for input(s): LIPASE, AMYLASE in the last 168 hours. No results for input(s): AMMONIA in the last 168 hours. Coagulation Profile: Recent Labs  Lab 03/21/19 0643  INR 2.1*   Cardiac Enzymes: No results for input(s): CKTOTAL, CKMB, CKMBINDEX, TROPONINI in the last 168 hours. BNP (last 3 results) No results for input(s): PROBNP in the last 8760 hours. HbA1C: No results for input(s): HGBA1C in the last 72 hours. CBG: Recent Labs  Lab 03/26/19 1633 03/26/19 2136 03/27/19 0837 03/27/19 1215 03/27/19 1619  GLUCAP 113* 141* 94 101* 114*   Lipid Profile: No results for input(s): CHOL, HDL, LDLCALC, TRIG, CHOLHDL, LDLDIRECT in the last 72 hours. Thyroid Function Tests: Recent Labs    03/26/19 1104  TSH 5.734*  FREET4 0.97   Anemia Panel: Recent Labs    03/26/19 1104  VITAMINB12 912  FOLATE 12.0   Sepsis Labs: No results for input(s): PROCALCITON, LATICACIDVEN in the last 168 hours.  Recent Results (from the past 240 hour(s))  Respiratory Panel by  RT PCR (Flu A&B, Covid) - Nasopharyngeal Swab     Status: None   Collection Time: 03/19/19 12:32 AM   Specimen: Nasopharyngeal Swab  Result Value Ref Range  Status   SARS Coronavirus 2 by RT PCR NEGATIVE NEGATIVE Final    Comment: (NOTE) SARS-CoV-2 target nucleic acids are NOT DETECTED. The SARS-CoV-2 RNA is generally detectable in upper respiratoy specimens during the acute phase of infection. The lowest concentration of SARS-CoV-2 viral copies this assay can detect is 131 copies/mL. A negative result does not preclude SARS-Cov-2 infection and should not be used as the sole basis for treatment or other patient management decisions. A negative result may occur with  improper specimen collection/handling, submission of specimen other than nasopharyngeal swab, presence of viral mutation(s) within the areas targeted by this assay, and inadequate number of viral copies (<131 copies/mL). A negative result must be combined with clinical observations, patient history, and epidemiological information. The expected result is Negative. Fact Sheet for Patients:  PinkCheek.be Fact Sheet for Healthcare Providers:  GravelBags.it This test is not yet ap proved or cleared by the [place] FDA and  has been authorized for detection and/or diagnosis of SARS-CoV-2 by FDA under an Emergency Use Authorization (EUA). This EUA will remain  in effect (meaning this test can be used) for the duration of the COVID-19 declaration under Section 564(b)(1) of the Act, 21 U.S.C. section 360bbb-3(b)(1), unless the authorization is terminated or revoked sooner.    Influenza A by PCR NEGATIVE NEGATIVE Final   Influenza B by PCR NEGATIVE NEGATIVE Final    Comment: (NOTE) The Xpert Xpress SARS-CoV-2/FLU/RSV assay is intended as an aid in  the diagnosis of influenza from Nasopharyngeal swab specimens and  should not be used as a sole basis for treatment.  Nasal washings and  aspirates are unacceptable for Xpert Xpress SARS-CoV-2/FLU/RSV  testing. Fact Sheet for Patients: PinkCheek.be Fact Sheet for Healthcare Providers: GravelBags.it This test is not yet approved or cleared by the [place] FDA and  has been authorized for detection and/or diagnosis of SARS-CoV-2 by  FDA under an Emergency Use Authorization (EUA). This EUA will remain  in effect (meaning this test can be used) for the duration of the  Covid-19 declaration under Section 564(b)(1) of the Act, 21  U.S.C. section 360bbb-3(b)(1), unless the authorization is  terminated or revoked. Performed at [facility], [address] [place], [place] [address]   Blood Culture (routine x 2)     Status: Abnormal   Collection Time: 03/19/19 12:36 AM   Specimen: BLOOD  Result Value Ref Range Status   Specimen Description   Final    BLOOD LEFT ANTECUBITAL Performed at [facility], [address] [place], [place] [address]    Special Requests   Final    BOTTLES DRAWN AEROBIC AND ANAEROBIC Blood Culture adequate volume Performed at [facility], [address] [place], [place] [address]    Culture  Setup Time   Final    GRAM POSITIVE COCCI IN BOTH AEROBIC AND ANAEROBIC BOTTLES CRITICAL RESULT CALLED TO, READ BACK BY AND VERIFIED WITH: ALEX CHAPPELL 03/19/19 @ 1401 Nadine    Culture METHICILLIN RESISTANT STAPHYLOCOCCUS AUREUS (A)  Final   Report Status 03/21/2019 FINAL  Final   Organism ID, Bacteria METHICILLIN RESISTANT STAPHYLOCOCCUS AUREUS  Final      Susceptibility   Methicillin resistant staphylococcus aureus - MIC*    CIPROFLOXACIN >=8 RESISTANT Resistant     ERYTHROMYCIN >=8 RESISTANT Resistant     GENTAMICIN <=0.5 SENSITIVE Sensitive     OXACILLIN >=4 RESISTANT Resistant     TETRACYCLINE <=1 SENSITIVE Sensitive     VANCOMYCIN <=0.5 SENSITIVE Sensitive  TRIMETH/SULFA <=10  SENSITIVE Sensitive     CLINDAMYCIN <=0.25 SENSITIVE Sensitive     RIFAMPIN <=0.5 SENSITIVE Sensitive     Inducible Clindamycin NEGATIVE Sensitive     * METHICILLIN RESISTANT STAPHYLOCOCCUS AUREUS  MRSA PCR Screening     Status: Abnormal   Collection Time: 03/19/19 12:25 PM   Specimen: Urine, Clean Catch; Nasopharyngeal  Result Value Ref Range Status   MRSA by PCR POSITIVE (A) NEGATIVE Final    Comment:        The GeneXpert MRSA Assay (FDA approved for NASAL specimens only), is one component of a comprehensive MRSA colonization surveillance program. It is not intended to diagnose MRSA infection nor to guide or monitor treatment for MRSA infections. RESULT CALLED TO, READ BACK BY AND VERIFIED WITH: DENISE [facility] AT [id number] 03/19/19.PMF Performed at [facility], [address] [place], [place] [address]   CULTURE, BLOOD (ROUTINE X 2) w Reflex to ID Panel     Status: None   Collection Time: 03/20/19  5:40 PM   Specimen: BLOOD  Result Value Ref Range Status   Specimen Description BLOOD LEFT ANTECUBITAL  Final   Special Requests   Final    BOTTLES DRAWN AEROBIC AND ANAEROBIC Blood Culture adequate volume   Culture   Final    NO GROWTH 5 DAYS Performed at [facility], [address] [place], [place] [address]    Report Status 03/25/2019 FINAL  Final  CULTURE, BLOOD (ROUTINE X 2) w Reflex to ID Panel     Status: None   Collection Time: 03/20/19  5:40 PM   Specimen: BLOOD  Result Value Ref Range Status   Specimen Description BLOOD BLOOD LEFT HAND  Final   Special Requests   Final    BOTTLES DRAWN AEROBIC AND ANAEROBIC Blood Culture adequate volume   Culture   Final    NO GROWTH 5 DAYS Performed at [facility], [address] [place], [place] [address]    Report Status 03/25/2019 FINAL  Final  Respiratory Panel by RT PCR (Flu A&B, Covid) - Nasopharyngeal Swab     Status: None   Collection Time: 03/27/19 10:02 AM   Specimen: Nasopharyngeal Swab    Result Value Ref Range Status   SARS Coronavirus 2 by RT PCR NEGATIVE NEGATIVE Final    Comment: (NOTE) SARS-CoV-2 target nucleic acids are NOT DETECTED. The SARS-CoV-2 RNA is generally detectable in upper respiratoy specimens during the acute phase of infection. The lowest concentration of SARS-CoV-2 viral copies this assay can detect is 131 copies/mL. A negative result does not preclude SARS-Cov-2 infection and should not be used as the sole basis for treatment or other patient management decisions. A negative result may occur with  improper specimen collection/handling, submission of specimen other than nasopharyngeal swab, presence of viral mutation(s) within the areas targeted by this assay, and inadequate number of viral copies (<131 copies/mL). A negative result must be combined with clinical observations, patient history, and epidemiological information. The expected result is Negative. Fact Sheet for Patients:  PinkCheek.be Fact Sheet for Healthcare Providers:  GravelBags.it This test is not yet ap proved or cleared by the [place] FDA and  has been authorized for detection and/or diagnosis of SARS-CoV-2 by FDA under an Emergency Use Authorization (EUA). This EUA will remain  in effect (meaning this test can be used) for the duration of the COVID-19 declaration under Section 564(b)(1) of the Act, 21 U.S.C. section 360bbb-3(b)(1), unless the authorization is terminated or revoked  sooner.    Influenza A by PCR NEGATIVE NEGATIVE Final   Influenza B by PCR NEGATIVE NEGATIVE Final    Comment: (NOTE) The Xpert Xpress SARS-CoV-2/FLU/RSV assay is intended as an aid in  the diagnosis of influenza from Nasopharyngeal swab specimens and  should not be used as a sole basis for treatment. Nasal washings and  aspirates are unacceptable for Xpert Xpress SARS-CoV-2/FLU/RSV  testing. Fact Sheet for  Patients: PinkCheek.be Fact Sheet for Healthcare Providers: GravelBags.it This test is not yet approved or cleared by the [place] FDA and  has been authorized for detection and/or diagnosis of SARS-CoV-2 by  FDA under an Emergency Use Authorization (EUA). This EUA will remain  in effect (meaning this test can be used) for the duration of the  Covid-19 declaration under Section 564(b)(1) of the Act, 21  U.S.C. section 360bbb-3(b)(1), unless the authorization is  terminated or revoked. Performed at [facility], [address], [place], [place] [address]      Radiology Studies: CT ANGIO CHEST PE W OR WO CONTRAST  Result Date: 03/26/2019 CLINICAL DATA:  Shortness of breath. Multiple medical problems. EXAM: CT ANGIOGRAPHY CHEST WITH CONTRAST TECHNIQUE: Multidetector CT imaging of the chest was performed using the standard protocol during bolus administration of intravenous contrast. Multiplanar CT image reconstructions and MIPs were obtained to evaluate the vascular anatomy. CONTRAST:  58mL OMNIPAQUE IOHEXOL 350 MG/ML SOLN COMPARISON:  01/26/2019 FINDINGS: Cardiovascular: Heart is markedly enlarged and stable in configuration. There is dense atherosclerotic calcification of the coronary arteries. Atherosclerosis of the thoracic aorta is not associated with aneurysm. Pulmonary arteries are well opacified and there is no evidence for acute pulmonary embolus. Mediastinum/Nodes: The visualized portion of the thyroid gland has a normal appearance. Esophagus is unremarkable. Numerous small mediastinal and hilar lymph nodes. Axillary regions are unremarkable. Lungs/Pleura: Moderate bilateral pleural effusions, RIGHT greater than LEFT. Dependent atelectasis/consolidation in the posterior LOWER lobes bilaterally. There are focal and confluent ground-glass opacities throughout the lungs, favoring pulmonary edema over infectious process.  Upper Abdomen: Cirrhotic morphology of the liver. Spleen appears enlarged. Dilated inferior vena cava consistent with RIGHT heart failure. Musculoskeletal: No chest wall abnormality. No acute or significant osseous findings. Review of the MIP images confirms the above findings. IMPRESSION: 1. Technically adequate exam showing no acute pulmonary embolus. 2. Cardiomegaly and changes of RIGHT heart failure. 3. Moderate bilateral pleural effusions, RIGHT greater than LEFT. 4. Focal and confluent ground-glass opacities throughout the lungs, favoring pulmonary edema over infectious process. 5. Cirrhotic morphology of the liver. 6. Splenomegaly. 7. Aortic Atherosclerosis (ICD10-I70.0). Electronically Signed   By: Nolon Nations M.D.   On: 03/26/2019 12:23   DG Chest Port 1 View  Result Date: 03/26/2019 CLINICAL DATA:  Shortness of breath and chest pain. EXAM: PORTABLE CHEST 1 VIEW COMPARISON:  March 25, 2019 FINDINGS: Stable cardiomegaly. Diffuse interstitial opacity on the left and patchy infiltrate on the right are stable. No interval changes. IMPRESSION: No interval change in bilateral pulmonary opacities. Electronically Signed   By: Dorise Bullion III M.D   On: 03/26/2019 10:28   DG Chest Port 1 View  Result Date: 03/25/2019 CLINICAL DATA:  Community acquired pneumonia. Patient states having a cough and some sob today. Hx of bronchitis, CAD, COPD. Never smoker. EXAM: PORTABLE CHEST 1 VIEW COMPARISON:  Chest radiograph 03/22/2019 FINDINGS: Stable position of a left upper extremity PICC. Unchanged cardiomediastinal contours. There are diffuse bilateral interstitial and airspace opacities, not significantly changed. Probable small bilateral effusions. No pneumothorax. No  acute finding in the visualized skeleton. IMPRESSION: Stable diffuse bilateral interstitial and airspace opacities. Electronically Signed   By: Audie Pinto M.D.   On: 03/25/2019 17:53    Scheduled Meds: . albuterol  2.5 mg  Nebulization Q6H  . aspirin EC  81 mg Oral Daily  . Chlorhexidine Gluconate Cloth  6 each Topical Daily  . dabigatran  150 mg Oral Q12H  . fluocinonide ointment   Topical BID  . fluticasone  2 spray Each Nare Daily  . folic acid  1 mg Oral Daily  . furosemide  20 mg Intravenous Q12H  . gabapentin  100 mg Oral BID  . insulin aspart  0-9 Units Subcutaneous TID PC & HS  . levETIRAcetam  750 mg Oral BID  . metoprolol succinate  50 mg Oral Daily  . mupirocin ointment   Topical BID  . nystatin-triamcinolone   Topical BID  . omega-3 acid ethyl esters  1 g Oral Daily  . phenytoin  400 mg Oral QHS  . polyethylene glycol  17 g Oral Daily  . potassium chloride  10 mEq Oral Daily  . rosuvastatin  20 mg Oral Daily  . sertraline  75 mg Oral Daily  . sodium chloride flush  10-40 mL Intracatheter Q12H  . sodium chloride flush  10-40 mL Intracatheter Q12H  . triamcinolone ointment   Topical TID   Continuous Infusions: . sodium chloride 10 mL/hr at 03/27/19 1636  . vancomycin       LOS: 8 days  Disposition: Expect d/c in next 48 hours if pt is clinically stable ,  Insurance authorization is pending. Time spent: 35 minutes.  Para Skeans, MD [facility] If 7PM-7AM, please contact night-coverage. [email or web address] 03/27/2019, 4:36 PM

## 2019-03-27 NOTE — Evaluation (Signed)
Clinical/Bedside Swallow Evaluation Patient Details  Name: Brooke Baker MRN: [id number] Date of Birth: 11/01/1934  Today's Date: 03/27/2019 Time: SLP Start Time (ACUTE ONLY): 1330 SLP Stop Time (ACUTE ONLY): 1427 SLP Time Calculation (min) (ACUTE ONLY): 57 min  Past Medical History:  Past Medical History:  Diagnosis Date  . Acute bronchitis   . Allergic rhinitis   . Arthritis   . CAD (coronary artery disease)    s/p stent LAD 8/03 and stent placement OM1  . Cancer ([place])    skin  . Chronic back pain   . COPD with asthma ([place])   . CVA (cerebral vascular accident) ([place])   . Diabetes mellitus ([place])   . Hypercholesterolemia   . Hypertension   . Seizure disorder [facility])    Past Surgical History:  Past Surgical History:  Procedure Laterality Date  . BREAST BIOPSY Bilateral    axillas  . CORONARY STENT PLACEMENT  8/03   LAD  . CORONARY STENT PLACEMENT     OM1  2006  . FEMUR FRACTURE SURGERY Right [id number]   Dr. Marry Guan  . LUMBAR SPINE SURGERY     x3  . TEE WITHOUT CARDIOVERSION N/A 03/21/2019   Procedure: TRANSESOPHAGEAL ECHOCARDIOGRAM (TEE);  Surgeon: Teodoro Spray, MD;  Location: [facility] ORS;  Service: Cardiovascular;  Laterality: N/A;  . TOTAL ABDOMINAL HYSTERECTOMY     appendectomy - age 65  . TOTAL HIP ARTHROPLASTY    . TOTAL KNEE ARTHROPLASTY     2005   HPI: Per admitting history and Physical:   "Brooke Baker  is a 85 y.o. Caucasian female with a known history of multiple medical problems that are mentioned below including COPD, type diabetes mellitus, hypertension, dyslipidemia and coronary artery disease, who presented to the emergency room with acute onset of fever with associated generalized weakness and dyspnea without significant cough and wheezing.  She has been having orthopnea and paroxysmal nocturnal dyspnea as well as worsening lower extremity edema.  She admits to dyspnea on exertion.  She admitted to nausea and vomiting without abdominal pain.  She denies any  dysuria, oliguria or hematuria or flank pain.  She admits to rhinorrhea and nasal and sinus congestion.  No chest pain or palpitations.  No sore throat or earache.  She denies any diarrhea or melena or bright red bleeding per rectum."  Assessment / Plan / Recommendation Clinical Impression  Pt presents with functional swallowing at bedside with no overt s/s of aspiration. Oral mech exam revelaed structures to be functioning adequately with no apprent unilateral weakness. Pt has upper dentures in but reports her lower partials are at home. Pt needed extended time to masticate solid cracker along with multiple sips of water. Pt became fatigued when eating. Pt reports the food is good but she has no appetite. Rec continue with Dysphagia 1 diet/puree at this time. If Pt has her partials once she is discharged, she should be able to tolerate a dysphagia 2 or chopped diet. Vocal quality remined clear with all consistencies tested. Encouraged Pt to try to eat more of her food and to always save the magic cup for a snack later if she is not able to eat it during he meals. ST to follow up with toleration of diet 1-2 days. SLP Visit Diagnosis: Dysphagia, oropharyngeal phase (R13.12)    Aspiration Risk  Mild aspiration risk    Diet Recommendation Dysphagia 1 (Puree)   Liquid Administration via: Cup;Straw;Other (Comment)(Single sips) Medication Administration: Whole meds with liquid Supervision: Staff  to assist with self feeding Compensations: Small sips/bites;Slow rate;Other (Comment)(allow periods of rest) Postural Changes: Seated upright at 90 degrees;Remain upright for at least 30 minutes after po intake    Other  Recommendations     Follow up Recommendations        Frequency and Duration min 1 x/week  1 week       Prognosis Prognosis for Safe Diet Advancement: Good      Swallow Study   General Type of Study: Bedside Swallow Evaluation Diet Prior to this Study: Dysphagia 1  (puree) Temperature Spikes Noted: No Respiratory Status: Nasal cannula History of Recent Intubation: No Behavior/Cognition: Alert;Cooperative;Pleasant mood Oral Cavity Assessment: Within Functional Limits Oral Cavity - Dentition: Dentures, top;Other (Comment)(has lower partials but not here) Vision: Impaired for self-feeding Self-Feeding Abilities: Needs assist Patient Positioning: Upright in bed Baseline Vocal Quality: Normal    Oral/Motor/Sensory Function Overall Oral Motor/Sensory Function: Within functional limits   Ice Chips Ice chips: Within functional limits Presentation: Cup   Thin Liquid Thin Liquid: Within functional limits Presentation: Cup;Spoon;Straw;Self Fed    Nectar Thick Nectar Thick Liquid: Not tested   Honey Thick Honey Thick Liquid: Not tested   Puree Puree: Within functional limits Presentation: Spoon   Solid     Solid: Impaired Presentation: Self Fed Oral Phase Impairments: Impaired mastication Oral Phase Functional Implications: Prolonged oral transit;Impaired mastication      Brooke Baker 03/27/2019,2:27 PM

## 2019-03-27 NOTE — Consult Note (Signed)
PHARMACY CONSULT NOTE - FOLLOW UP  Pharmacy Consult for Electrolyte Monitoring and Replacement   Recent Labs: Potassium (mmol/L)  Date Value  03/27/2019 4.0  05/17/2012 3.4 (L)   Magnesium (mg/dL)  Date Value  03/27/2019 1.7   Calcium (mg/dL)  Date Value  03/27/2019 7.5 (L)   Calcium, Total (mg/dL)  Date Value  05/17/2012 8.5   Albumin (g/dL)  Date Value  03/24/2019 1.9 (L)   Phosphorus (mg/dL)  Date Value  03/27/2019 2.6   Sodium (mmol/L)  Date Value  03/27/2019 136  05/17/2012 132 (L)    Assessment: [name], type diabetes mellitus, HTN, DLD, and CAD, who presented to the emergency room with acute onset of fever with associated generalized weakness and dyspnea without significant cough and wheezing.   Pharmacy has been consulted for electrolyte management.   3/22- Mg 1.7  Goal of Therapy:  Electrolytes WNL  Plan:  Will order magnesium 2g x1   Will continue home potassium dose of KCl 66meq qd  Will order BMP, phosphorus, and magnesium with AM labs.      Rowland Lathe ,PharmD Clinical Pharmacist 03/27/2019 7:32 AM

## 2019-03-27 NOTE — Progress Notes (Signed)
PT Cancellation Note  Patient Details Name: Brooke Baker MRN: [id number] DOB: 1934/09/10   Cancelled Treatment:    Reason Eval/Treat Not Completed: Other (comment)(Per MD exiting pt room, MD would like PT to wait for patient have medications and breathing treatment prior to PT, PT to re-attempt as able.)   Lieutenant Diego PT, DPT 9:30 AM,03/27/19

## 2019-03-27 NOTE — TOC Progression Note (Signed)
Transition of Care [facility]) - Progression Note    Patient Details  Name: Brooke Baker MRN: [id number] Date of Birth: 1934/10/29  Transition [facility]) CM/SW [place], RN Phone Number: 03/27/2019, 9:31 AM  Clinical Narrative:     Patient required continued medical workup over the weekend. Plan is to discharge to [facility] today if Covid negative.     Expected Discharge Plan: [place] Barriers to Discharge: Continued Medical Work up  Expected Discharge Plan and Services Expected Discharge Plan: [place] In-house Referral: Clinical Social Work Discharge Planning Services: CM Consult Post Acute Care Choice: [place] arrangements for the past 2 months: Single Family Home                 DME Arranged: N/A DME Agency: NA       HH Arranged: NA HH Agency: NA         Social Determinants of Health (SDOH) Interventions    Readmission Risk Interventions No flowsheet data found.

## 2019-03-28 DIAGNOSIS — I959 Hypotension, unspecified: Secondary | ICD-10-CM

## 2019-03-28 LAB — BASIC METABOLIC PANEL
Anion gap: 5 (ref 5–15)
BUN: 16 mg/dL (ref 8–23)
CO2: 29 mmol/L (ref 22–32)
Calcium: 7.5 mg/dL — ABNORMAL LOW (ref 8.9–10.3)
Chloride: 102 mmol/L (ref 98–111)
Creatinine, Ser: 0.92 mg/dL (ref 0.44–1.00)
GFR calc Af Amer: >60 mL/min (ref >60)
GFR calc non Af Amer: 58 mL/min — ABNORMAL LOW (ref >60)
Glucose, Bld: 111 mg/dL — ABNORMAL HIGH (ref 70–99)
Potassium: 3.9 mmol/L (ref 3.5–5.1)
Sodium: 136 mmol/L (ref 135–145)

## 2019-03-28 LAB — CBC
HCT: 25.7 % — ABNORMAL LOW (ref 36.0–46.0)
Hemoglobin: 8.2 g/dL — ABNORMAL LOW (ref 12.0–15.0)
MCH: 33.9 pg (ref 26.0–34.0)
MCHC: 31.9 g/dL (ref 30.0–36.0)
MCV: 106.2 fL — ABNORMAL HIGH (ref 80.0–100.0)
Platelets: 165 10*3/uL (ref 150–400)
RBC: 2.42 MIL/uL — ABNORMAL LOW (ref 3.87–5.11)
RDW: 15.4 % (ref 11.5–15.5)
WBC: 6.3 10*3/uL (ref 4.0–10.5)
nRBC: 0 % (ref 0.0–0.2)

## 2019-03-28 LAB — BLOOD GAS, VENOUS
Acid-Base Excess: 4.9 mmol/L — ABNORMAL HIGH (ref 0.0–2.0)
Bicarbonate: 31.4 mmol/L — ABNORMAL HIGH (ref 20.0–28.0)
FIO2: 0.24
O2 Saturation: 67.6 %
Patient temperature: 37
pCO2, Ven: 53 mmHg (ref 44.0–60.0)
pH, Ven: 7.38 (ref 7.250–7.430)
pO2, Ven: 36 mmHg (ref 32.0–45.0)

## 2019-03-28 LAB — GLUCOSE, CAPILLARY
Glucose-Capillary: 100 mg/dL — ABNORMAL HIGH (ref 70–99)
Glucose-Capillary: 130 mg/dL — ABNORMAL HIGH (ref 70–99)
Glucose-Capillary: 126 mg/dL — ABNORMAL HIGH (ref 70–99)
Glucose-Capillary: 121 mg/dL — ABNORMAL HIGH (ref 70–99)

## 2019-03-28 LAB — MAGNESIUM: Magnesium: 1.8 mg/dL (ref 1.7–2.4)

## 2019-03-28 LAB — BRAIN NATRIURETIC PEPTIDE: B Natriuretic Peptide: 332 pg/mL — ABNORMAL HIGH (ref 0.0–100.0)

## 2019-03-28 LAB — PHOSPHORUS: Phosphorus: 2.8 mg/dL (ref 2.5–4.6)

## 2019-03-28 LAB — VANCOMYCIN, TROUGH: Vancomycin Tr: 21 ug/mL (ref 15–20)

## 2019-03-28 MED ORDER — METOPROLOL SUCCINATE ER 25 MG PO TB24
12.5000 mg | ORAL_TABLET | Freq: Every day | ORAL | Status: DC
  Administered 2019-03-29: 12.5 mg via ORAL
  Filled 2019-03-28: qty 1

## 2019-03-28 MED ORDER — ALBUTEROL SULFATE (2.5 MG/3ML) 0.083% IN NEBU: 2.5000 mg | INHALATION_SOLUTION | RESPIRATORY_TRACT | Status: DC | PRN

## 2019-03-28 MED ORDER — VANCOMYCIN HCL 750 MG/150ML IV SOLN
750.0000 mg | INTRAVENOUS | Status: DC
  Filled 2019-03-28: qty 150

## 2019-03-28 MED ORDER — MAGNESIUM SULFATE IN D5W 1-5 GM/100ML-% IV SOLN
1.0000 g | Freq: Once | INTRAVENOUS | Status: AC
  Administered 2019-03-28: 1 g via INTRAVENOUS
  Filled 2019-03-28: qty 100

## 2019-03-28 MED ORDER — FUROSEMIDE 20 MG PO TABS
20.0000 mg | ORAL_TABLET | Freq: Two times a day (BID) | ORAL | Status: DC
  Administered 2019-03-28 – 2019-03-29 (×3): 20 mg via ORAL
  Filled 2019-03-28 (×3): qty 1

## 2019-03-28 NOTE — Progress Notes (Signed)
PROGRESS NOTE    Brooke Baker  [id number] DOB: 12-25-1934 DOA: 03/18/2019 PCP: Einar Pheasant, MD   Brief Narrative:  Brooke Baker, type diabetes mellitus, hypertension, dyslipidemia and coronary artery disease, who presented to the emergency room with acute onset of fever with associated generalized weakness and dyspnea without significant cough and wheezing. She has been having orthopnea and paroxysmal nocturnal dyspnea as well as worsening lower extremity edema. She admits to dyspnea on exertion. She admitted to nausea and vomiting without abdominal pain.  On presentation febrile, chest x-ray with bilateral interstitial densities concerning for disease progression versus superimposed pneumonia. She was started on ceftriaxone and azithromycin. Blood cultures are growing staph aureus.  Antibiotics switched with vancomycin.  03/22/19 Patient was feeling little better when seen today.  No new complaints. Labs shows anemia with hemoglobin of 7.0 and  Her pradaxa has been held for her chronic a.fib.  Oncology is on board as is GI and cardiology. Pt is awake,alert and cooperative and oriented Denies any complaints of chest pain or sob.  03/23/19: Pt seen today and is alert and awake and denies any complaints. Ros is o/w negative. Hb is 8.6 post 2 unit prbc.  Ct imaging pending.  3/19 Pt seen today and she is alert/awake d/w her about need for rehab and she understands.  States she feel good otherwise , d/w her about restarting anticoagulation for her a.fib.  And reason we held uit due to her platelet and anemia.  03/25/19 Pt is alert/awake and oriented and denies any complaints or pain and is waiting for insurance authorization for discharge. Chest xray was stable with stable infiltrates seen previously.   03/26/19 Pt seen today for f/u and she is alert awake  and oriented and says she has not been out of bed we will get PT on board again to work with her until she is discharged.  Labs are pending , magnesium is low and will replace.   03/27/19 Blood pressure 100/87, pulse 78, temperature 98.4 F (36.9 C), temperature source Oral, resp. rate 19, height 5\' 6"  (1.676 m), weight 109.8 kg, last menstrual period 08/17/1965, SpO2 100 %.  3/23 Pt is lethargic and has been dizzy with PT. Currently Changed to 2L [place]. To prevent oxygen toxicity due to her Baker history.  BNP is little high and bp is low normal and I have d/c her am dose of iv  lasix  And have started po lasix.   Assessment & Plan:   Active Problems:   AF (paroxysmal atrial fibrillation) (HCC)   Macrocytic anemia   Chronic respiratory failure ([place])   Community acquired pneumonia  Sepsis secondary to MRSA bacteremia and bilateral pneumonia.  TTE and TEE was negative for vegetations.  Patient has hardware due to right hip and knee replacement and high risk for seeding.  Blood culture drawn on 03/20/19 remain negative so far.  ID was consulted-recommending at least 4-week of vancomycin. -PICC line will be placed today -ID is also recommending CT spine before discharge to rule out any osteomyelitis or discitis which was done. -PT/OT are recommending SNF placement but patient wants to go home with home health services. -Continue supportive care with DuoNeb. -Will reorder chest xray to document resolution.  Acute on chronic diastolic CHF, possibly cor pulmonale.  She has a history of preserved EF of 55% with NYHA class II- III ACC/AHA stage C. Patient is on 2  L at home. She was initially diuresed with Lasix 40 mg twice daily, which was discontinued due to increased in creatinine. -pt changed to lasix 20 mg po q12h until specified.  -Continue to monitor. -Continue daily BMP, strict intake and output and daily weight. -Echocardiogram-with normal EF and no wall motion abnormalities. however  pt as not taking lasix for 2 days. 2 D Echo: 1. Left ventricular ejection fraction, by estimation, is 70 to 75%. Left  ventricular ejection fraction by PLAX is 75 %. The left ventricle has  hyperdynamic function. The left ventricle has no regional wall motion  abnormalities. Left ventricular  diastolic parameters are consistent with Grade I diastolic dysfunction  (impaired relaxation).  2. Right ventricular systolic function is normal. The right ventricular  size is mildly enlarged. There is moderately elevated pulmonary artery  systolic pressure.  3. Left atrial size was mildly dilated.  4. Right atrial size was mildly dilated.  5. The mitral valve is grossly normal. Mild mitral valve regurgitation.  6. The aortic valve is grossly normal. Aortic valve regurgitation is  trivial.   Paroxysmal atrial fibrillation.   -Currently regular and rate controlled. -Continue Toprol-XL and Pradaxa restarted -Cardiology is following and we appreciate their recommendations.  Hypertension.   -Blood pressure within goal. -Continue home antihypertensives caution with meds to allow room for diuretic use to prevent hypotension.   Macrocytic anemia.  Hemoglobin stable.  Patient follow-up with hematology.  She has rheumatoid factor positive psoriatic arthritis.  Labs were consistent with hypoproliferative bone marrow.  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  They were planning to do a bone marrow evaluation if hemoglobin continued to drop, the time of prior hematology visit her hemoglobin was 9.7.  Hb s/p transfusion is stable at 8.0 yesterday, today labs are pending. We will transfuse if it goes below 8.  Pancytopenia/Thrombocytopenia.  Worsening thrombocytopenia with platelet of 71.  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  Patient is also on Pradaxa which increased her chances of bleeding. Discussed with Dr. Janese Banks who saw the  patient and recommending continuation of Pradaxa at this time.  Pradaxa need to be discontinued if platelets drop below 50,000. -We will discontinue pradaxa due to anemia -Continue to monitor platelets. -Monitor for any sign of bleeding. -suspect pancytopenia could also be due to combination of antiseizure meds.   History of seizure disorder.  -We will continue home antiseizure meds.  Dyslipidemia. -Continue statin.  Depression. -Continue Zoloft.  Objective: Vitals:   03/28/19 0944 03/28/19 0945 03/28/19 1212 03/28/19 1653  BP: (!) 98/58 (!) 104/50 (!) 131/57 (!) 121/45  Pulse:   86 83  Resp:   16 16  Temp:   97.6 F (36.4 C) 97.6 F (36.4 C)  TempSrc:      SpO2:   100% 100%  Weight:      Height:        Intake/Output Summary (Last 24 hours) at 03/28/2019 1836 Last data filed at 03/28/2019 1213 Gross per 24 hour  Intake 10 ml  Output 800 ml  Net -790 ml   Filed Weights   03/26/19 0641 03/27/19 0313  Weight: 116.1 kg 109.8 kg    Examination: General exam: Appears calm and comfortable  Respiratory system: scattered exp wheezing , Respiratory effort normal. Cardiovascular system: S1 & S2 heard, RRR. No JVD, murmurs, rubs, gallops or clicks. Gastrointestinal system: Soft, nontender, nondistended, bowel sounds positive. Central nervous system: Alert and oriented. No focal  neurological deficits.Symmetric 5 x 5 power. Extremities: 1+LE edema with signs of chronic venous dermatitis, 2+ upper extremity edema, worse on left with ecchymosis,pulses intact and symmetrical. Psychiatry: Judgement and insight appear normal. Mood & affect appropriate.   Consultants:   Hematology  Cardiology  ID  PT Data Reviewed: I have personally reviewed following labs and imaging studies  CBC: Recent Labs  Lab 03/22/19 0837 03/22/19 2004 03/23/19 0500 03/24/19 0500 03/26/19 1022 03/27/19 0549 03/28/19 0957  WBC 3.7*  --  5.2 5.2  --  4.9 6.3  NEUTROABS 2.3  --  2.9 2.9  --   --    --   HGB 7.0*   < > 8.6* 8.5* 9.1* 8.0* 8.2*  HCT 21.7*   < > 26.6* 26.4* 28.1* 24.7* 25.7*  MCV 108.5*  --  104.7* 105.2*  --  105.1* 106.2*  PLT 71*  --  90* 114*  --  142* 165   < > = values in this interval not displayed.   Basic Metabolic Panel: Recent Labs  Lab 03/23/19 0500 03/23/19 0500 03/24/19 0500 03/24/19 1235 03/25/19 1312 03/26/19 0627 03/27/19 0549 03/28/19 0356 03/28/19 0957  NA 135  --  136  --   --  135 136 136  --   K 3.5   < > 3.9 4.1  --  4.0 4.0 3.9  --   CL 107  --  105  --   --  103 103 102  --   CO2 25  --  25  --   --  27 28 29   --   GLUCOSE 90  --  96  --   --  99 98 111*  --   BUN 24*  --  21  --   --  16 15 16   --   CREATININE 0.99  --  0.90  0.84  --   --  0.75 0.77 0.92  --   CALCIUM 7.4*  --  7.4*  --   --  7.6* 7.5* 7.5*  --   MG  --   --  1.2*  --  1.5* 1.7 1.7  --  1.8  PHOS  --   --  1.8*  --  2.0* 2.4* 2.6 2.8  --    < > = values in this interval not displayed.   GFR: Estimated Creatinine Clearance: 59.2 mL/min (by C-G formula based on SCr of 0.92 mg/dL). Liver Function Tests: Recent Labs  Lab 03/24/19 0500  AST 39  ALT 12  ALKPHOS 82  BILITOT 1.3*  PROT 6.1*  ALBUMIN 1.9*   No results for input(s): LIPASE, AMYLASE in the last 168 hours. No results for input(s): AMMONIA in the last 168 hours. Coagulation Profile: No results for input(s): INR, PROTIME in the last 168 hours. Cardiac Enzymes: No results for input(s): CKTOTAL, CKMB, CKMBINDEX, TROPONINI in the last 168 hours. BNP (last 3 results) No results for input(s): PROBNP in the last 8760 hours. HbA1C: No results for input(s): HGBA1C in the last 72 hours. CBG: Recent Labs  Lab 03/27/19 1619 03/27/19 2158 03/28/19 0741 03/28/19 1218 03/28/19 1654  GLUCAP 114* 174* 100* 130* 126*   Lipid Profile: No results for input(s): CHOL, HDL, LDLCALC, TRIG, CHOLHDL, LDLDIRECT in the last 72 hours. Thyroid Function Tests: Recent Labs    03/26/19 1104  TSH 5.734*  FREET4  0.97   Anemia Panel: Recent Labs    03/26/19 1104  VITAMINB12 912  FOLATE 12.0   Sepsis Labs:  No results for input(s): PROCALCITON, LATICACIDVEN in the last 168 hours.  Recent Results (from the past 240 hour(s))  Respiratory Panel by RT PCR (Flu A&B, Covid) - Nasopharyngeal Swab     Status: None   Collection Time: 03/19/19 12:32 AM   Specimen: Nasopharyngeal Swab  Result Value Ref Range Status   SARS Coronavirus 2 by RT PCR NEGATIVE NEGATIVE Final    Comment: (NOTE) SARS-CoV-2 target nucleic acids are NOT DETECTED. The SARS-CoV-2 RNA is generally detectable in upper respiratoy specimens during the acute phase of infection. The lowest concentration of SARS-CoV-2 viral copies this assay can detect is 131 copies/mL. A negative result does not preclude SARS-Cov-2 infection and should not be used as the sole basis for treatment or other patient management decisions. A negative result may occur with  improper specimen collection/handling, submission of specimen other than nasopharyngeal swab, presence of viral mutation(s) within the areas targeted by this assay, and inadequate number of viral copies (<131 copies/mL). A negative result must be combined with clinical observations, patient history, and epidemiological information. The expected result is Negative. Fact Sheet for Patients:  PinkCheek.be Fact Sheet for Healthcare Providers:  GravelBags.it This test is not yet ap proved or cleared by the [place] FDA and  has been authorized for detection and/or diagnosis of SARS-CoV-2 by FDA under an Emergency Use Authorization (EUA). This EUA will remain  in effect (meaning this test can be used) for the duration of the COVID-19 declaration under Section 564(b)(1) of the Act, 21 U.S.C. section 360bbb-3(b)(1), unless the authorization is terminated or revoked sooner.    Influenza A by PCR NEGATIVE NEGATIVE Final    Influenza B by PCR NEGATIVE NEGATIVE Final    Comment: (NOTE) The Xpert Xpress SARS-CoV-2/FLU/RSV assay is intended as an aid in  the diagnosis of influenza from Nasopharyngeal swab specimens and  should not be used as a sole basis for treatment. Nasal washings and  aspirates are unacceptable for Xpert Xpress SARS-CoV-2/FLU/RSV  testing. Fact Sheet for Patients: PinkCheek.be Fact Sheet for Healthcare Providers: GravelBags.it This test is not yet approved or cleared by the [place] FDA and  has been authorized for detection and/or diagnosis of SARS-CoV-2 by  FDA under an Emergency Use Authorization (EUA). This EUA will remain  in effect (meaning this test can be used) for the duration of the  Covid-19 declaration under Section 564(b)(1) of the Act, 21  U.S.C. section 360bbb-3(b)(1), unless the authorization is  terminated or revoked. Performed at [facility], [address], [place], [place] [address]   Blood Culture (routine x 2)     Status: Abnormal   Collection Time: 03/19/19 12:36 AM   Specimen: BLOOD  Result Value Ref Range Status   Specimen Description   Final    BLOOD LEFT ANTECUBITAL Performed at [facility], [address] [place], [place] [address]    Special Requests   Final    BOTTLES DRAWN AEROBIC AND ANAEROBIC Blood Culture adequate volume Performed at [facility], [address] [place],  [address]    Culture  Setup Time   Final    GRAM POSITIVE COCCI IN BOTH AEROBIC AND ANAEROBIC BOTTLES CRITICAL RESULT CALLED TO, READ BACK BY AND VERIFIED WITH: ALEX CHAPPELL 03/19/19 @ 20 [place]    Culture METHICILLIN RESISTANT STAPHYLOCOCCUS AUREUS (A)  Final   Report Status 03/21/2019 FINAL  Final   Organism ID, Bacteria METHICILLIN RESISTANT STAPHYLOCOCCUS AUREUS  Final      Susceptibility  Methicillin resistant staphylococcus aureus - MIC*    CIPROFLOXACIN >=8  RESISTANT Resistant     ERYTHROMYCIN >=8 RESISTANT Resistant     GENTAMICIN <=0.5 SENSITIVE Sensitive     OXACILLIN >=4 RESISTANT Resistant     TETRACYCLINE <=1 SENSITIVE Sensitive     VANCOMYCIN <=0.5 SENSITIVE Sensitive     TRIMETH/SULFA <=10 SENSITIVE Sensitive     CLINDAMYCIN <=0.25 SENSITIVE Sensitive     RIFAMPIN <=0.5 SENSITIVE Sensitive     Inducible Clindamycin NEGATIVE Sensitive     * METHICILLIN RESISTANT STAPHYLOCOCCUS AUREUS  MRSA PCR Screening     Status: Abnormal   Collection Time: 03/19/19 12:25 PM   Specimen: Urine, Clean Catch; Nasopharyngeal  Result Value Ref Range Status   MRSA by PCR POSITIVE (A) NEGATIVE Final    Comment:        The GeneXpert MRSA Assay (FDA approved for NASAL specimens only), is one component of a comprehensive MRSA colonization surveillance program. It is not intended to diagnose MRSA infection nor to guide or monitor treatment for MRSA infections. RESULT CALLED TO, READ BACK BY AND VERIFIED WITH: [facility] AT [id number] 03/19/19.PMF Performed at [facility], [address] [place], [place] [address]   CULTURE, BLOOD (ROUTINE X 2) w Reflex to ID Panel     Status: None   Collection Time: 03/20/19  5:40 PM   Specimen: BLOOD  Result Value Ref Range Status   Specimen Description BLOOD LEFT ANTECUBITAL  Final   Special Requests   Final    BOTTLES DRAWN AEROBIC AND ANAEROBIC Blood Culture adequate volume   Culture   Final    NO GROWTH 5 DAYS Performed at [facility], [address] [place], [place] [address]    Report Status 03/25/2019 FINAL  Final  CULTURE, BLOOD (ROUTINE X 2) w Reflex to ID Panel     Status: None   Collection Time: 03/20/19  5:40 PM   Specimen: BLOOD  Result Value Ref Range Status   Specimen Description BLOOD BLOOD LEFT HAND  Final   Special Requests   Final    BOTTLES DRAWN AEROBIC AND ANAEROBIC Blood Culture adequate volume   Culture   Final    NO GROWTH 5 DAYS Performed at [facility], [address] [place], [place] [address]    Report Status 03/25/2019 FINAL  Final  Respiratory Panel by RT PCR (Flu A&B, Covid) - Nasopharyngeal Swab     Status: None   Collection Time: 03/27/19 10:02 AM   Specimen: Nasopharyngeal Swab  Result Value Ref Range Status   SARS Coronavirus 2 by RT PCR NEGATIVE NEGATIVE Final    Comment: (NOTE) SARS-CoV-2 target nucleic acids are NOT DETECTED. The SARS-CoV-2 RNA is generally detectable in upper respiratoy specimens during the acute phase of infection. The lowest concentration of SARS-CoV-2 viral copies this assay can detect is 131 copies/mL. A negative result does not preclude SARS-Cov-2 infection and should not be used as the sole basis for treatment or other patient management decisions. A negative result may occur with  improper specimen collection/handling, submission of specimen other than nasopharyngeal swab, presence of viral mutation(s) within the areas targeted by this assay, and inadequate number of viral copies (<131 copies/mL). A negative result must be combined with clinical observations, patient history, and epidemiological information. The expected result is Negative. Fact Sheet for Patients:  PinkCheek.be Fact Sheet for Healthcare Providers:  GravelBags.it This test is not yet ap proved or cleared by the [place] FDA  and  has been authorized for detection and/or diagnosis of SARS-CoV-2 by FDA under an Emergency Use Authorization (EUA). This EUA will remain  in effect (meaning this test can be used) for the duration of the COVID-19 declaration under Section 564(b)(1) of the Act, 21 U.S.C. section 360bbb-3(b)(1), unless the authorization is terminated or revoked sooner.    Influenza A by PCR NEGATIVE NEGATIVE Final   Influenza B by PCR NEGATIVE NEGATIVE Final    Comment: (NOTE) The Xpert Xpress SARS-CoV-2/FLU/RSV assay is intended as an aid in  the  diagnosis of influenza from Nasopharyngeal swab specimens and  should not be used as a sole basis for treatment. Nasal washings and  aspirates are unacceptable for Xpert Xpress SARS-CoV-2/FLU/RSV  testing. Fact Sheet for Patients: PinkCheek.be Fact Sheet for Healthcare Providers: GravelBags.it This test is not yet approved or cleared by the [place] FDA and  has been authorized for detection and/or diagnosis of SARS-CoV-2 by  FDA under an Emergency Use Authorization (EUA). This EUA will remain  in effect (meaning this test can be used) for the duration of the  Covid-19 declaration under Section 564(b)(1) of the Act, 21  U.S.C. section 360bbb-3(b)(1), unless the authorization is  terminated or revoked. Performed at [facility], [address] [place], [place] [address]      Radiology Studies: No results found.  Scheduled Meds: . aspirin EC  81 mg Oral Daily  . Chlorhexidine Gluconate Cloth  6 each Topical Daily  . dabigatran  150 mg Oral Q12H  . fluocinonide ointment   Topical BID  . fluticasone  2 spray Each Nare Daily  . folic acid  1 mg Oral Daily  . furosemide  20 mg Oral BID  . gabapentin  100 mg Oral BID  . insulin aspart  0-9 Units Subcutaneous TID PC & HS  . levETIRAcetam  750 mg Oral BID  . [START ON 03/29/2019] metoprolol succinate  12.5 mg Oral Daily  . mupirocin ointment   Topical BID  . nystatin-triamcinolone   Topical BID  . omega-3 acid ethyl esters  1 g Oral Daily  . phenytoin  400 mg Oral QHS  . polyethylene glycol  17 g Oral Daily  . potassium chloride  10 mEq Oral Daily  . rosuvastatin  20 mg Oral Daily  . sertraline  75 mg Oral Daily  . sodium chloride flush  10-40 mL Intracatheter Q12H  . sodium chloride flush  10-40 mL Intracatheter Q12H  . triamcinolone ointment   Topical TID   Continuous Infusions: . sodium chloride Stopped (03/27/19 1755)  . vancomycin 1,000 mg (03/28/19  1757)     LOS: 9 days  Disposition: Expect d/c in next 48 hours if pt is clinically stable ,  Insurance authorization is pending. Time spent: 35 minutes.  Para Skeans, MD [facility] If 7PM-7AM, please contact night-coverage. [email or web address] 03/28/2019, 6:36 PM

## 2019-03-28 NOTE — Plan of Care (Signed)
  Problem: Clinical Measurements: Goal: Ability to maintain a body temperature in the normal range will improve Outcome: Progressing   Problem: Respiratory: Goal: Ability to maintain adequate ventilation will improve Outcome: Progressing   Problem: Activity: Goal: Ability to tolerate increased activity will improve Outcome: Not Progressing Note: Patient is fatigued.

## 2019-03-28 NOTE — Progress Notes (Signed)
PHARMACY CONSULT NOTE FOR:  OUTPATIENT  PARENTERAL ANTIBIOTIC THERAPY (OPAT)  Indication: MRSA bacteremia with joint hardware (TEE neg) Regimen: vancomycin 1gm IV q24h End date: 04/15/2019  Updated d/c orders  To discharging provider:  please sign these orders via discharge navigator,  Select New Orders & click on the button choice - Manage This Unsigned Work.     Thank you for allowing pharmacy to be a part of this patient's care.  Kristeen Miss, PharmD Clinical Pharmacist  03/28/2019 9:33 AM

## 2019-03-28 NOTE — Consult Note (Signed)
PHARMACY CONSULT NOTE - FOLLOW UP  Pharmacy Consult for Electrolyte Monitoring and Replacement   Recent Labs: Potassium (mmol/L)  Date Value  03/28/2019 3.9  05/17/2012 3.4 (L)   Magnesium (mg/dL)  Date Value  03/27/2019 1.7   Calcium (mg/dL)  Date Value  03/28/2019 7.5 (L)   Calcium, Total (mg/dL)  Date Value  05/17/2012 8.5   Albumin (g/dL)  Date Value  03/24/2019 1.9 (L)   Phosphorus (mg/dL)  Date Value  03/28/2019 2.8   Sodium (mmol/L)  Date Value  03/28/2019 136  05/17/2012 132 (L)    Assessment: [name], type diabetes mellitus, HTN, DLD, and CAD, who presented to the emergency room with acute onset of fever with associated generalized weakness and dyspnea without significant cough and wheezing.   Pharmacy has been consulted for electrolyte management.   3/23 K 3.9; phosphorus 2.8; Mg 1.8 (per protocol, magnesium can be replaced). Slight hypomagnesemia but all other electrolytes are WNL.    Goal of Therapy:  Electrolytes WNL  Plan:  Will order magnesium 1g x1 dose given hypomagnesia and on lower end.    Will continue home potassium dose of KCl 29meq qd  Will order BMP, phosphorus, and magnesium with AM labs.      Rowland Lathe ,PharmD Clinical Pharmacist 03/28/2019 7:44 AM

## 2019-03-28 NOTE — Progress Notes (Signed)
All medications administered by Modesta Messing Student Nurse were supervised by this RN Cylas Falzone A Capria Cartaya

## 2019-03-28 NOTE — Progress Notes (Signed)
Speech Language Pathology Treatment: Dysphagia  Patient Details Name: Brooke Baker MRN: [id number] DOB: January 28, 1934 Today's Date: 03/28/2019 Time: [id number] SLP Time Calculation (min) (ACUTE ONLY): 50 min  Assessment / Plan / Recommendation Clinical Impression  Pt seen for ongoing assessment of swallowing and trials to upgrade diet consistency from Pureed foods d/t pt's request "I don't want to eat pureed foods". Pt wears an upper Denture plate and lower Partial plate but "often" eats w/out it in place baseline, per pt report. Pt is alert, verbally engaged w/ SLP and NSG in room. She is able to help sit herself upright for po's. Education given on need to attend to mastication of solids for safe oral intake; pt agreed. Pt given trials of mech soft food consistency and thin liquids as trials to upgrade diet.   Pt appears to present w/ adequate oropharyngeal phase swallowing function w/ reduced risk for prandial aspiration following general aspiration precautions. Pt exhibited adequate oropharyngeal phase swallowing of all po trials w/ only min increased mastication time and effort w/ increased textured solid food trials -- suspect directly related to missing bottom dentition. Given time for mastication, oral clearing was achieved w/ each trials. Pt herself denied any difficulty w/ chewing/swallowing her foods/liquids. No overt, clinical s/s of aspiration noted; vocal quality clear during/post trials. No decline in respiratory presentation w/ po intake. Oral phase appeared grossly [facility] for bolus management and oral clearing post swallowing of the moistened, Small bites of solid foods. Pt fed self w/ min setup support d/t eating in bed.  Recommend upgrade to a Mech Soft diet consistency w/ thin liquids; general aspiration precautions; taught pt the strategy of Pills swallowed Whole in Applesauce/puree if needed for ease of swallowing. Pt agreed. NSG reported good toleration of Pills swallowed w/ water. No  further skilled ST services indicated. NSG to reconsult if any new needs arise while admitted.      HPI HPI: Pt is a 85 y.o. Caucasian female with a known history of multiple medical problems that are mentioned below including COPD, type diabetes mellitus, hypertension, dyslipidemia and coronary artery disease, who presented to the emergency room with acute onset of fever with associated generalized weakness and dyspnea without significant cough and wheezing.  She has been having orthopnea and paroxysmal nocturnal dyspnea as well as worsening lower extremity edema.  She admits to dyspnea on exertion.  She admitted to nausea and vomiting without abdominal pain.  She denies any dysuria, oliguria or hematuria or flank pain.  She admits to rhinorrhea and nasal and sinus congestion.  No chest pain or palpitations.  No sore throat or earache.  She denies any diarrhea or melena or bright red bleeding per rectum.  Pt wears upper Denture, lower Partial(not in currently).       SLP Plan  Continue with current plan of care(po check x1)       Recommendations  Diet recommendations: Dysphagia 3 (mechanical soft);Thin liquid Liquids provided via: Cup;Straw Medication Administration: Whole meds with liquid(or Whole with puree if needed) Supervision: Patient able to self feed(tray setup) Compensations: Minimize environmental distractions;Slow rate;Small sips/bites;Lingual sweep for clearance of pocketing;Multiple dry swallows after each bite/sip;Follow solids with liquid Postural Changes and/or Swallow Maneuvers: Seated upright 90 degrees;Upright 30-60 min after meal                General recommendations: (dietician f/u as needed) Oral Care Recommendations: Oral care BID;Oral care before and after PO;Patient independent with oral care(support) Follow up Recommendations: None SLP Visit Diagnosis:  Dysphagia, unspecified (R13.10) Plan: Continue with current plan of care(po check x1)       GO                  Orinda Kenner, MS, CCC-SLP [name] 03/28/2019, 4:39 PM

## 2019-03-28 NOTE — Consult Note (Addendum)
Pharmacy Antibiotic Note  Brooke Baker is a 85 y.o. female admitted on 03/18/2019 with sepsis. CXR impression of progression of lung disease versus superimposed pneumonia. Pharmacy has been consulted for vancomycin dosing. Patient is also on azithromycin. Patient was febrile to 103 on presentation which has resolved.   Tmax 24h 99.30F. No leukocytosis but patient with possible hypo proliferative process per hematology .   3/14 Blood cultures with 2/4 bottles (same set) MRSA. Repeat blood cultures 3/15 no growth x 2 days. Infectious diseases following. TEE without evidence of endocarditis. PICC placed 3/17.    Vancomycin peak: 22 Vancomycin trough: 15 (drawn 18 hr after last dose) >> 21 (slightly supratherapeutic)   Scr1.09>0.99>0.84>> 0.75 >> 0.77> 0.92 (slightly up from yesterday)  Plan: Will decrease Vancomycin to 750 mg IV Q 24 hrs and will adjust OPAT discharge order.   Patient will be on prolonged therapy for 4 weeks per ID due to presence of prosthetic joints.  Height: 5\' 6"  (167.6 cm) Weight: (pt on low bed ) IBW/kg (Calculated) : 59.3  Temp (24hrs), Avg:97.9 F (36.6 C), Min:97.6 F (36.4 C), Max:98.4 F (36.9 C)  Recent Labs  Lab 03/22/19 0837 03/22/19 0837 03/22/19 2004 03/23/19 0500 03/23/19 1200 03/24/19 0500 03/26/19 0627 03/27/19 0549 03/28/19 0356 03/28/19 0957  WBC 3.7*  --   --  5.2  --  5.2  --  4.9  --  6.3  CREATININE 1.09*   < >  --  0.99  --  0.90  0.84 0.75 0.77 0.92  --   VANCOTROUGH  --   --   --   --  15  --   --   --   --   --   VANCOPEAK  --   --  22*  --   --   --   --   --   --   --    < > = values in this interval not displayed.    Estimated Creatinine Clearance: 59.2 mL/min (by C-G formula based on SCr of 0.92 mg/dL).    Allergies  Allergen Reactions  . Doxycycline Diarrhea and Nausea Only  . Methotrexate Rash    Rowland Lathe  03/28/2019 1:39 PM

## 2019-03-28 NOTE — Progress Notes (Signed)
Physical Therapy Treatment Patient Details Name: Brooke Baker MRN: [id number] DOB: 08-Feb-1934 Today's Date: 03/28/2019    History of Present Illness Brooke Baker  is a 85 y.o. Caucasian female with a known history of multiple medical problems that are mentioned below including COPD, type diabetes mellitus, hypertension, dyslipidemia and coronary artery disease, who presented to the emergency room with acute onset of fever with associated generalized weakness and dyspnea without significant cough and wheezing. Found to be septic with progressive lung disease with pneumonia. Patient on chronic O2 3-4L, is ind with ADLs with RW.  Patient recently with downtrending HCT and Hgb recieving blood transfusion on 03/21/19 and 03/22/19.    PT Comments    Pt was in semi-fowlers positioned upon arriving. RN in room with pt's spouse at bedside. Pt slightly SOB at rest on 3 L o2 but sao2 >90% throughout session. Pt's BP 129/55 and HR 88bpm. HR elevated to 110 during session with standing. She was able to exit L side of bed with increased time + mod assist. Sat EOB to catch breath x several minutes prior to standing 3 x. Pt required min assist to stand to RW with vcs throughout for breathing, technique, and safe sequencing. She tolerated standing ~ 1 minutes prior to requesting to sit and rest. PT fatigued more quickly this date than previously observed in past sessions. Pt does present with weakness throughout and has RLE knee buckling in standing. Therapist recommends +2 assist for safety with any future standing activity 2/2 to weakness/fatigue. On her third transfer STS, she took three steps from FOB to [facility] with constant min assist + gait belt+ RW. Pt very fatigued after only several steps and requesting to return to supine in bed. Overall pt present with deconditioning but vitals  stable throughout. PT recommends DC to SNF when medically stable. She will benefit from skilled rehab to address deficits with  strength, conditioning, and safe functional mobility.    Follow Up Recommendations  SNF     Equipment Recommendations  Rolling walker with 5" wheels    Recommendations for Other Services       Precautions / Restrictions Precautions Precautions: Fall Precaution Comments: watch HR, pt HR elevated to 110 with minimal activity.Does c/o feeling SOB Restrictions Weight Bearing Restrictions: No    Mobility  Bed Mobility Overal bed mobility: Needs Assistance Bed Mobility: Supine to Sit     Supine to sit: Min assist;Mod assist;HOB elevated Sit to supine: Mod assist;Max assist   General bed mobility comments: Pt required mod assist to achieve EOB sitting. Pt on 2 L o2 throughout with sao2 > 92%. Pt fatigues quickly with HR elevation to 110 from 88bpm. SOB noted but pt reports assymptomatic  Transfers Overall transfer level: Needs assistance Equipment used: Rolling walker (2 wheeled) Transfers: Sit to/from Stand Sit to Stand: Min assist;From elevated surface         General transfer comment: Pt performed STS 3 x EOB prior to taking several steps to [facility]. knee buckling noted so did not progress away from EOB.   Ambulation/Gait Ambulation/Gait assistance: Min assist Gait Distance (Feet): 3 Feet Assistive device: Rolling walker (2 wheeled) Gait Pattern/deviations: Trunk flexed;Shuffle Gait velocity: decreased   General Gait Details: Pt unsafe to ambulate away from EOB 2/2 to fatigue and knee buckling present. PT did however take ~ 3 steps from FOB to [facility]. vcs throughout for breathing and posture correction   Stairs  Wheelchair Mobility    Modified Rankin (Stroke Patients Only)       Balance Overall balance assessment: Needs assistance;History of Falls Sitting-balance support: Feet supported;No upper extremity supported Sitting balance-Leahy Scale: Good Sitting balance - Comments: No LOB seated EOB   Standing balance support: During functional  activity Standing balance-Leahy Scale: Poor Standing balance comment: pt has knee buckling and is very unsteady in standing even with BUE support                            Cognition Arousal/Alertness: Awake/alert Behavior During Therapy: WFL for tasks assessed/performed Overall Cognitive Status: Within Functional Limits for tasks assessed                                 General Comments: Pt was cooperative and motivated. Was able to follow command consistantly      Exercises      General Comments        Pertinent Vitals/Pain Pain Assessment: No/denies pain    Home Living                      Prior Function            PT Goals (current goals can now be found in the care plan section) Acute Rehab PT Goals Patient Stated Goal: " I want to get better.' Progress towards PT goals: Progressing toward goals    Frequency    Min 2X/week      PT Plan Current plan remains appropriate    Co-evaluation              AM-PAC PT "6 Clicks" Mobility   Outcome Measure  Help needed turning from your back to your side while in a flat bed without using bedrails?: A Little Help needed moving from lying on your back to sitting on the side of a flat bed without using bedrails?: A Lot Help needed moving to and from a bed to a chair (including a wheelchair)?: A Little Help needed standing up from a chair using your arms (e.g., wheelchair or bedside chair)?: A Little Help needed to walk in hospital room?: A Lot Help needed climbing 3-5 steps with a railing? : A Lot 6 Click Score: 15    End of Session Equipment Utilized During Treatment: Gait belt;Oxygen Activity Tolerance: Patient limited by fatigue Patient left: in bed;with call bell/phone within reach;with bed alarm set;with nursing/sitter in room Nurse Communication: Mobility status PT Visit Diagnosis: Unsteadiness on feet (R26.81);Muscle weakness (generalized) (M62.81);Other abnormalities  of gait and mobility (R26.89);History of falling (Z91.81);Difficulty in walking, not elsewhere classified (R26.2)     Time: 1410-1434 PT Time Calculation (min) (ACUTE ONLY): 24 min  Charges:  $Therapeutic Activity: 23-37 mins                     Julaine Fusi PTA 03/28/19, 2:54 PM

## 2019-03-28 NOTE — Care Management Important Message (Signed)
Important Message  Patient Details  Name: Brooke Baker MRN: [id number] Date of Birth: 05/10/1934   Medicare Important Message Given:  Other (see comment)  Attempted to review with patient via room phone due to isolation, however no answer.  Attempted to call daughter, though phone with straight to voicemail.  Unable to leave message mailbox full.  Update:  Daughter returned call.  Reviewed Medicare IM with her.  Copy sent securely to attention at [email or web address].     Dannette Barbara 03/28/2019, 11:18 AM

## 2019-03-28 NOTE — Progress Notes (Signed)
PT Cancellation Note  Patient Details Name: Brooke Baker MRN: [id number] DOB: July 17, 1934   Cancelled Treatment:     PT attempt. Pt was asleep in long sitting upon arriving. Visibly SOB with labored breathing. She easily awakes and is conversational however very lethargic and unwilling to get OOB 2/2 fatigue. She politely refused PT at this time. Therapist will attempt to return later this date.     Willette Pa 03/28/2019, 12:54 PM

## 2019-03-29 ENCOUNTER — Ambulatory Visit: Payer: Medicare PPO | Admitting: Family

## 2019-03-29 DIAGNOSIS — J159 Unspecified bacterial pneumonia: Secondary | ICD-10-CM | POA: Diagnosis not present

## 2019-03-29 DIAGNOSIS — I10 Essential (primary) hypertension: Secondary | ICD-10-CM | POA: Diagnosis not present

## 2019-03-29 DIAGNOSIS — I4891 Unspecified atrial fibrillation: Secondary | ICD-10-CM | POA: Diagnosis not present

## 2019-03-29 DIAGNOSIS — A419 Sepsis, unspecified organism: Secondary | ICD-10-CM | POA: Diagnosis not present

## 2019-03-29 DIAGNOSIS — M2550 Pain in unspecified joint: Secondary | ICD-10-CM | POA: Diagnosis not present

## 2019-03-29 DIAGNOSIS — R5381 Other malaise: Secondary | ICD-10-CM | POA: Diagnosis not present

## 2019-03-29 DIAGNOSIS — J449 Chronic obstructive pulmonary disease, unspecified: Secondary | ICD-10-CM | POA: Diagnosis not present

## 2019-03-29 DIAGNOSIS — R279 Unspecified lack of coordination: Secondary | ICD-10-CM | POA: Diagnosis not present

## 2019-03-29 DIAGNOSIS — J9610 Chronic respiratory failure, unspecified whether with hypoxia or hypercapnia: Secondary | ICD-10-CM | POA: Diagnosis not present

## 2019-03-29 DIAGNOSIS — Z7401 Bed confinement status: Secondary | ICD-10-CM | POA: Diagnosis not present

## 2019-03-29 DIAGNOSIS — I959 Hypotension, unspecified: Secondary | ICD-10-CM | POA: Diagnosis not present

## 2019-03-29 DIAGNOSIS — I509 Heart failure, unspecified: Secondary | ICD-10-CM | POA: Diagnosis not present

## 2019-03-29 DIAGNOSIS — E119 Type 2 diabetes mellitus without complications: Secondary | ICD-10-CM | POA: Diagnosis not present

## 2019-03-29 DIAGNOSIS — Z743 Need for continuous supervision: Secondary | ICD-10-CM | POA: Diagnosis not present

## 2019-03-29 DIAGNOSIS — A4102 Sepsis due to Methicillin resistant Staphylococcus aureus: Secondary | ICD-10-CM | POA: Diagnosis not present

## 2019-03-29 DIAGNOSIS — I2510 Atherosclerotic heart disease of native coronary artery without angina pectoris: Secondary | ICD-10-CM | POA: Diagnosis not present

## 2019-03-29 LAB — BASIC METABOLIC PANEL
Anion gap: 6 (ref 5–15)
BUN: 16 mg/dL (ref 8–23)
CO2: 29 mmol/L (ref 22–32)
Calcium: 7.4 mg/dL — ABNORMAL LOW (ref 8.9–10.3)
Chloride: 101 mmol/L (ref 98–111)
Creatinine, Ser: 0.83 mg/dL (ref 0.44–1.00)
GFR calc Af Amer: >60 mL/min (ref >60)
GFR calc non Af Amer: >60 mL/min (ref >60)
Glucose, Bld: 108 mg/dL — ABNORMAL HIGH (ref 70–99)
Potassium: 3.7 mmol/L (ref 3.5–5.1)
Sodium: 136 mmol/L (ref 135–145)

## 2019-03-29 LAB — GLUCOSE, CAPILLARY
Glucose-Capillary: 95 mg/dL (ref 70–99)
Glucose-Capillary: 110 mg/dL — ABNORMAL HIGH (ref 70–99)

## 2019-03-29 LAB — MAGNESIUM: Magnesium: 1.6 mg/dL — ABNORMAL LOW (ref 1.7–2.4)

## 2019-03-29 MED ORDER — METOPROLOL SUCCINATE ER 25 MG PO TB24: 25.0000 mg | ORAL_TABLET | Freq: Every day | ORAL | 0 refills | Status: AC

## 2019-03-29 MED ORDER — MOMETASONE FURO-FORMOTEROL FUM 100-5 MCG/ACT IN AERO
2.0000 | INHALATION_SPRAY | Freq: Two times a day (BID) | RESPIRATORY_TRACT | Status: DC
  Filled 2019-03-29: qty 8.8

## 2019-03-29 MED ORDER — VANCOMYCIN IV (FOR PTA / DISCHARGE USE ONLY): 750.0000 mg | INTRAVENOUS | 0 refills | Status: DC

## 2019-03-29 MED ORDER — MAGNESIUM SULFATE 2 GM/50ML IV SOLN
2.0000 g | Freq: Once | INTRAVENOUS | Status: AC
  Administered 2019-03-29: 2 g via INTRAVENOUS
  Filled 2019-03-29: qty 50

## 2019-03-29 NOTE — Progress Notes (Signed)
PICC dressing changed by SWOT RN Velna Hatchet. Report called to [facility], patient prepared for transport and EMS called for transportation.

## 2019-03-29 NOTE — Progress Notes (Signed)
SLP F/U Note  Patient Details Name: Brooke Baker MRN: [id number] DOB: 04/19/1934   Cancelled treatment:       Reason Eval/Treat Not Completed: (chart reviewed; consulted NSG). Met w/ pt after breakfast meal. She stated she was chewing the soft solid foods "just fine" but did not like the taste d/t "no salt". No deficits w/ mastication or swallowing during recent meals yesterday post upgrade to mech soft consistency diet per chart/NSG report.  Recommend continue a more mech soft diet for the moistened solid foods -- easy to chew when not wearing the Partial. Recommend general aspiration precautions including Pills whole in puree w/ NSG as needed. No further skilled ST services indicated at this time. NSG to reconsult if any new needs arise during admit. CM updated.     Orinda Kenner, MS, CCC-SLP Damiean Lukes 03/29/2019, 9:59 AM

## 2019-03-29 NOTE — TOC Transition Note (Signed)
Transition of Care [facility]) - CM/SW Discharge Note   Patient Details  Name: JERMIYAH MCALHANY MRN: [id number] Date of Birth: [birth year]  Transition of [facility] CM/SW Contact:  Victorino Dike, RN Phone Number: 03/29/2019, 10:19 AM   Clinical Narrative:     Notified Daugther Tammy of Transfer.  Patient to discharge to [facility] today by [facility].  Discharge Packet placed on chart.  Bedside nurse to call report to [facility].  EMS called for tranport. No further TOC needs at this time, please re-consult for new needs.     Final next level of care: Skilled Nursing Facility Barriers to Discharge: Barriers Resolved   Patient Goals and CMS Choice Patient states their goals for this hospitalization and ongoing recovery are:: I want to go with my husband CMS Medicare.gov Compare Post Acute Care list provided to:: Patient Choice offered to / list presented to : Patient, Adult Children  Discharge Placement              Patient chooses bed at: [facility]) Patient to be transferred to facility by: [facility] Name of family member notified: Tammy, Daughter Patient and family notified of of transfer: 03/29/19  Discharge Plan and Services In-house Referral: Clinical Social Work Discharge Planning Services: [employer] Consult Post Acute Care Choice: Home Health          DME Arranged: N/A DME Agency: NA       HH Arranged: NA HH Agency: NA        Social Determinants of Health (SDOH) Interventions     Readmission Risk Interventions No flowsheet data found.

## 2019-03-29 NOTE — Discharge Summary (Signed)
[facility] Discharge Summary   Patient: Brooke Baker [id number]  PCP: Einar Pheasant, MD  Date of admission: 03/18/2019   Date of discharge:  03/29/2019     Discharge Diagnoses:  Principal diagnosis Sepsis due top MRSA bacteremia and bilateral pneumonia   Active Problems:   AF (paroxysmal atrial fibrillation) (HCC)   Macrocytic anemia   Chronic respiratory failure ([facility])   Community acquired pneumonia  Admitted From: home Disposition:  SNF   Recommendations for Outpatient Follow-up:  1. PCP: please follow up in 1 week 2. Follow up with ID Follow up clinic date           First week April            3. Follow up LABS/TEST:  CBC CMP and Vancomycin levels   Contact information for follow-up providers    [place]. Schedule an appointment as soon as possible for a visit in 1 week(s).   Specialty: Cardiology Why: at 1:00pm. Enter through the [facility] entrance Contact information: [address] [place], [place], [place]. Schedule an appointment as soon as possible for a visit in 1 week(s).   Specialty: Internal Medicine Contact information: [id number] [place] [address] [phone]        Sindy Guadeloupe, MD Follow up in 2 week(s).   Specialty: Oncology Contact information: [address] [place] [address] [phone]        Isaias Cowman, MD Follow up in 1 week(s).   Specialty: Cardiology Contact information: [facility] [place] [address] [phone]            Contact information for after-discharge care    Destination    [facility] Preferred SNF .   Service: Skilled Nursing Contact information: [address] [place] [phone]                 Diet recommendation: Cardiac  diet  Activity: The patient is advised to gradually reintroduce usual activities, as tolerated  Discharge Condition: stable  Code Status: Full code   History of present illness: As per the H and P dictated on admission, "Brooke Baker  is a 85 y.o. Caucasian female with a known history of multiple medical problems that are mentioned below including COPD, type diabetes mellitus, hypertension, dyslipidemia and coronary artery disease, who presented to the emergency room with acute onset of fever with associated generalized weakness and dyspnea without significant cough and wheezing.  She has been having orthopnea and paroxysmal nocturnal dyspnea as well as worsening lower extremity edema.  She admits to dyspnea on exertion.  She admitted to nausea and vomiting without abdominal pain.  She denies any dysuria, oliguria or hematuria or flank pain.  She admits to rhinorrhea and nasal and sinus congestion.  No chest pain or palpitations.  No sore throat or earache.  She denies any diarrhea or melena or bright red bleeding per rectum.  Upon presentation emergency room, blood pressure was 149/66 and temperature was 103 with a heart rate of 117 and respiratory rate was up to 25 with pulse 70 of 97% on room air.  Labs revealed CO2 of 21 BUN of 19 and creatinine 1.12, total bili of 1.5.  BNP was 608, lactic acid 1.7 and procalcitonin 0.44.  CBC showed hemoglobin of 8.8 hematocrit 28.2 WBC 5.4 and  platelets of 83.  PCR and influenza antigens came back negative.  2 blood cultures were drawn.  Portable chest ray showed interval progression of interstitial densities that may represent progression of lung disease versus superimposed pneumonia. EKG showed atrial fibrillation with rapid ventricular sponsor 117 with left anterior fascicular block, LAD and low voltage QRS with Q waves anteroseptally  The patient was given a gram of p.o. Tylenol, 1 L IV lactated ringer as well as IV cefepime and vancomycin.  She will be  admitted to telemetry bed for further evaluation and management."  Hospital Course:  Summary of her active problems in the hospital is as following.   Sepsis secondary to MRSA bacteremia and bilateral pneumonia.  TTE and TEE was negative for vegetations.   Patient has hardware due to right hip and knee replacement and high risk for seeding.   Blood culture drawn on 03/20/19 remain negative so far. ID was consulted-recommending at least 4-week of vancomycin. PICC line placed ID is also recommending CT spine before discharge to rule out any osteomyelitis or discitis which was done. covid negative.  Continue supportive care with DuoNeb.  Acute on chronic diastolic CHF, possibly cor pulmonale. She has a history of preserved EF of 55% with NYHA class II-IIIACC/AHA stage C. Patient is on 2 L at home. She was initially diuresed with Lasix 40 mg twice daily, which was discontinued due to increased in creatinine. -Echocardiogram-with normal EF and no wall motion abnormalities. 2 D Echo: 1. Left ventricular ejection fraction, by estimation, is 70 to 75%. Left  ventricular ejection fraction by PLAX is 75 %. The left ventricle has  hyperdynamic function. The left ventricle has no regional wall motion  abnormalities. Left ventricular  diastolic parameters are consistent with Grade I diastolic dysfunction  (impaired relaxation).  2. Right ventricular systolic function is normal. The right ventricular  size is mildly enlarged. There is moderately elevated pulmonary artery  systolic pressure.  3. Left atrial size was mildly dilated.  4. Right atrial size was mildly dilated.  5. The mitral valve is grossly normal. Mild mitral valve regurgitation.  6. The aortic valve is grossly normal. Aortic valve regurgitation is  trivial.   Paroxysmal atrial fibrillation.   -Currently regular and rate controlled. -Continue Toprol-XL and Pradaxa restarted  Hypertension.   -Blood pressure within  goal. -Continue Toprol and lasix. Hold other meds.   Macrocytic anemia.   Hemoglobin stable.   Patient follow-up with hematology.   She has rheumatoid factor positive psoriatic arthritis.   Labs were consistent with hypoproliferative bone marrow.   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   They were planning to do a bone marrow evaluation if hemoglobin continued to drop, the time of prior hematology visit her hemoglobin was 9.7.   Hb s/p transfusion is stable  Pancytopenia/Thrombocytopenia.   Worsening thrombocytopenia with platelet of 71.   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  Patient is also on Pradaxa which increased her chances of bleeding. Discussed with Dr. Janese Banks who saw the patient and recommending continuation of Pradaxa at this time.   Pradaxa need to be discontinued if platelets drop below 50,000.  History of seizure disorder.   -We will continue home antiseizure meds.  Dyslipidemia. -Continue statin.  Depression. -Continue Zoloft.  Body mass index is 39.06 kg/m.   Patient was seen by physical therapy, who recommended SNF, which was arranged. On the day of the discharge the patient's vitals were  stable, and no other acute medical condition were reported by patient. the patient was felt safe to be discharge at SNF with therapy.  Consultants: ID, Cardiology, Oncology  Procedures: PICC line placement. Echocardiogram. TEE.   Discharge Exam: General: Appear in no distress, no Rash; Oral Mucosa Clear, moist. Cardiovascular: S1 and S2 Present, no Murmur, Respiratory: normal respiratory effort, Bilateral Air entry present and no Crackles, no wheezes Abdomen: Bowel Sound present, Soft and no tenderness, no hernia Extremities: no Pedal edema, no calf tenderness Neurology: alert and oriented to time, place, and person affect appropriate.  Filed Weights   03/26/19 0641 03/27/19 0313   Weight: 116.1 kg 109.8 kg   Vitals:   03/29/19 0426 03/29/19 0745  BP: (!) 121/45 131/68  Pulse: (!) 103 98  Resp:  18  Temp: 98.4 F (36.9 C) 98.5 F (36.9 C)  SpO2: 97% 100%    DISCHARGE MEDICATION: Allergies as of 03/29/2019      Reactions   Doxycycline Diarrhea, Nausea Only   Methotrexate Rash      Medication List    STOP taking these medications   amLODipine 10 MG tablet Commonly known as: NORVASC   losartan 100 MG tablet Commonly known as: COZAAR     TAKE these medications   albuterol (2.5 MG/3ML) 0.083% nebulizer solution Commonly known as: PROVENTIL USE ONE VIAL VIA NEBULZIER EVERY FOUR HOURS AS NEEDED FOR WHEEZING   albuterol 108 (90 Base) MCG/ACT inhaler Commonly known as: Ventolin HFA USE 2 PUFFS EVERY 4 HOURS AS DIRECTED - RESCUE   aspirin 81 MG tablet Take 81 mg by mouth daily.   budesonide-formoterol 80-4.5 MCG/ACT inhaler Commonly known as: SYMBICORT INHALE 2 PUFFS TWICE A DAY   dabigatran 150 MG Caps capsule Commonly known as: PRADAXA Take 150 mg by mouth every 12 (twelve) hours.   Fish Oil 1000 MG Caps Take by mouth.   fluticasone 50 MCG/ACT nasal spray Commonly known as: FLONASE USE TWO SPRAYS IN EACH NOSTRIL EACH DAY AS DIRECTED BY PHYSICIAN   furosemide 40 MG tablet Commonly known as: LASIX Take 40 mg by mouth daily.   gabapentin 100 MG capsule Commonly known as: NEURONTIN TAKE ONE CAPSULE BY MOUTH TWICE A DAY   levETIRAcetam 750 MG tablet Commonly known as: KEPPRA Take 1 tablet (750 mg total) by mouth 2 (two) times daily.   metoprolol succinate 25 MG 24 hr tablet Commonly known as: TOPROL-XL Take 1 tablet (25 mg total) by mouth daily. Start taking on: March 30, 2019 What changed:   medication strength  See the new instructions.   mupirocin ointment 2 % Commonly known as: Bactroban Apply to affected area on leg twice a day   nystatin-triamcinolone cream Commonly known as: MYCOLOG II APPLY 1 APPLICATION TOPICALLY  2 TIMES DAILY TO BOTTOM   phenytoin 100 MG ER capsule Commonly known as: DILANTIN Take 4 capsules (400 mg total) by mouth at bedtime.   polyethylene glycol 17 g packet Commonly known as: MIRALAX / GLYCOLAX Take 17 g by mouth daily.   potassium chloride 10 MEQ tablet Commonly known as: KLOR-CON Take 1 tablet (10 mEq total) by mouth daily.   rosuvastatin 20 MG tablet Commonly known as: CRESTOR TAKE 1 TABLET BY MOUTH DAILY   sertraline 50 MG tablet Commonly known as: ZOLOFT TAKE 1 AND 1/2 TABLETS BY MOUTH ONCE A DAY.   vancomycin  IVPB Inject 750 mg into the vein daily for 21 days. Indication: MRSA bacteremia with joint hardware (TEE neg) Last Day of  Therapy: 04/15/2019 Labs - Sunday/Monday:  CBC/D, CMP, and vancomycin trough. Labs - Thursday:  BMP, CRP and vancomycin trough            Home Infusion Instuctions  (From admission, onward)         Start     Ordered   03/24/19 0000  Home infusion instructions    Question:  Instructions  Answer:  Flushing of vascular access device: 0.9% NaCl pre/post medication administration and prn patency; Heparin 100 u/ml, 3ml for implanted ports and Heparin 10u/ml, 50ml for all other central venous catheters.   03/24/19 0809         Allergies  Allergen Reactions  . Doxycycline Diarrhea and Nausea Only  . Methotrexate Rash   Discharge Instructions    Diet - low sodium heart healthy   Complete by: As directed    Home infusion instructions   Complete by: As directed    Instructions: Flushing of vascular access device: 0.9% NaCl pre/post medication administration and prn patency; Heparin 100 u/ml, 81ml for implanted ports and Heparin 10u/ml, 29ml for all other central venous catheters.   Increase activity slowly   Complete by: As directed       The results of significant diagnostics from this hospitalization (including imaging, microbiology, ancillary and laboratory) are listed below for reference.    Significant Diagnostic  Studies: CT HEAD WO CONTRAST  Result Date: 03/19/2019 CLINICAL DATA:  85 year old female with fall. EXAM: CT HEAD WITHOUT CONTRAST TECHNIQUE: Contiguous axial images were obtained from the base of the skull through the vertex without intravenous contrast. COMPARISON:  Head CT dated 09/08/2018. FINDINGS: Brain: There is moderate age-related atrophy and chronic microvascular ischemic changes. There is no acute intracranial hemorrhage. No mass effect or midline shift. No extra-axial fluid collection. Vascular: No hyperdense vessel or unexpected calcification. Skull: Normal. Negative for fracture or focal lesion. Sinuses/Orbits: There is opacification of the left frontal sinuses and maxillary sinuses with remodeling of the medial wall of the right maxillary sinus. Opacification of the left sphenoid sinuses. No air-fluid level. The mastoid air cells are clear. Other: None IMPRESSION: 1. No acute intracranial pathology. 2. Age-related atrophy and chronic microvascular ischemic changes. 3. Chronic paranasal sinus disease. Electronically Signed   By: Anner Crete M.D.   On: 03/19/2019 22:31   CT ANGIO CHEST PE W OR WO CONTRAST  Result Date: 03/26/2019 CLINICAL DATA:  Shortness of breath. Multiple medical problems. EXAM: CT ANGIOGRAPHY CHEST WITH CONTRAST TECHNIQUE: Multidetector CT imaging of the chest was performed using the standard protocol during bolus administration of intravenous contrast. Multiplanar CT image reconstructions and MIPs were obtained to evaluate the vascular anatomy. CONTRAST:  80mL OMNIPAQUE IOHEXOL 350 MG/ML SOLN COMPARISON:  01/26/2019 FINDINGS: Cardiovascular: Heart is markedly enlarged and stable in configuration. There is dense atherosclerotic calcification of the coronary arteries. Atherosclerosis of the thoracic aorta is not associated with aneurysm. Pulmonary arteries are well opacified and there is no evidence for acute pulmonary embolus. Mediastinum/Nodes: The visualized portion of  the thyroid gland has a normal appearance. Esophagus is unremarkable. Numerous small mediastinal and hilar lymph nodes. Axillary regions are unremarkable. Lungs/Pleura: Moderate bilateral pleural effusions, RIGHT greater than LEFT. Dependent atelectasis/consolidation in the posterior LOWER lobes bilaterally. There are focal and confluent ground-glass opacities throughout the lungs, favoring pulmonary edema over infectious process. Upper Abdomen: Cirrhotic morphology of the liver. Spleen appears enlarged. Dilated inferior vena cava consistent with RIGHT heart failure. Musculoskeletal: No chest wall abnormality. No acute or significant osseous findings. Review  of the MIP images confirms the above findings. IMPRESSION: 1. Technically adequate exam showing no acute pulmonary embolus. 2. Cardiomegaly and changes of RIGHT heart failure. 3. Moderate bilateral pleural effusions, RIGHT greater than LEFT. 4. Focal and confluent ground-glass opacities throughout the lungs, favoring pulmonary edema over infectious process. 5. Cirrhotic morphology of the liver. 6. Splenomegaly. 7. Aortic Atherosclerosis (ICD10-I70.0). Electronically Signed   By: Nolon Nations M.D.   On: 03/26/2019 12:23   CT CERVICAL SPINE WO CONTRAST  Result Date: 03/23/2019 CLINICAL DATA:  Sepsis and lower extremity edema EXAM: CT CERVICAL SPINE WITHOUT CONTRAST TECHNIQUE: Multidetector CT imaging of the cervical spine was performed without intravenous contrast. Multiplanar CT image reconstructions were also generated. COMPARISON:  09/08/2018 FINDINGS: Alignment: Grade 1 anterolisthesis at C3-4 and C4-5. Reversal of normal cervical lordosis may be positional or due to muscle spasm. Skull base and vertebrae: No acute fracture. Vertebral body heights are maintained. Soft tissues and spinal canal: No prevertebral fluid or swelling. No visible canal hematoma. Disc levels:  No bony spinal canal stenosis. Upper chest: Bilateral pleural effusions. Other: None  IMPRESSION: 1. No acute fracture or static subluxation of the cervical spine. 2. Bilateral pleural effusions. Electronically Signed   By: Ulyses Jarred M.D.   On: 03/23/2019 20:55   CT LUMBAR SPINE WO CONTRAST  Result Date: 03/23/2019 CLINICAL DATA:  Sepsis EXAM: CT LUMBAR SPINE WITHOUT CONTRAST TECHNIQUE: Multidetector CT imaging of the lumbar spine was performed without intravenous contrast administration. Multiplanar CT image reconstructions were also generated. COMPARISON:  CT myelogram 09/10/2004 FINDINGS: Segmentation: Normal Alignment: Grade 1 retrolisthesis at L2-3 and L3-4. Vertebrae: Unchanged chronic compression deformity of L2. L4-S1 posterior instrumented fusion. Paraspinal and other soft tissues: Calcific aortic atherosclerosis and bilateral pleural effusions. Horseshoe kidney. Disc levels: L2-3: Mild spinal canal stenosis due to combination of endplate spurring and disc bulge. Moderate facet hypertrophy. Neural foramina are patent. L3-4: Mild spinal canal stenosis due to disc bulge and facet hypertrophy. No stenosis. L4-5: Status post right laminectomy. No spinal canal or neural foraminal stenosis. L5-S1: Posterior decompression. No spinal canal stenosis. Moderate right and mild left neural foraminal stenosis, unchanged. IMPRESSION: 1. No acute fracture or static subluxation of the lumbar spine. Status post L4-S1 posterior instrumented fusion. No hardware adverse features. 2. Unchanged moderate right and mild left L5-S1 neural foraminal stenosis. 3. Mild L2-3 and L3-4 spinal canal stenosis. 4. Aortic Atherosclerosis (ICD10-I70.0). Electronically Signed   By: Ulyses Jarred M.D.   On: 03/23/2019 21:10   DG Chest Port 1 View  Result Date: 03/26/2019 CLINICAL DATA:  Shortness of breath and chest pain. EXAM: PORTABLE CHEST 1 VIEW COMPARISON:  March 25, 2019 FINDINGS: Stable cardiomegaly. Diffuse interstitial opacity on the left and patchy infiltrate on the right are stable. No interval changes.  IMPRESSION: No interval change in bilateral pulmonary opacities. Electronically Signed   By: Dorise Bullion III M.D   On: 03/26/2019 10:28   DG Chest Port 1 View  Result Date: 03/25/2019 CLINICAL DATA:  Community acquired pneumonia. Patient states having a cough and some sob today. Hx of bronchitis, CAD, COPD. Never smoker. EXAM: PORTABLE CHEST 1 VIEW COMPARISON:  Chest radiograph 03/22/2019 FINDINGS: Stable position of a left upper extremity PICC. Unchanged cardiomediastinal contours. There are diffuse bilateral interstitial and airspace opacities, not significantly changed. Probable small bilateral effusions. No pneumothorax. No acute finding in the visualized skeleton. IMPRESSION: Stable diffuse bilateral interstitial and airspace opacities. Electronically Signed   By: Audie Pinto M.D.   On: 03/25/2019 17:53  DG Chest Port 1 View  Result Date: 03/22/2019 CLINICAL DATA:  PICC placement EXAM: PORTABLE CHEST 1 VIEW COMPARISON:  03/18/2019 chest radiograph. FINDINGS: Left PICC terminates at the cavoatrial junction. Stable cardiomediastinal silhouette with mild cardiomegaly. No pneumothorax. Small bilateral pleural effusions, stable. Patchy hazy and linear interstitial opacities throughout both lungs, similar. IMPRESSION: 1. Left PICC terminates at the cavoatrial junction. 2. Stable small bilateral pleural effusions. 3. Mild cardiomegaly. Patchy hazy and linear interstitial opacities throughout both lungs, similar, either pulmonary edema or atypical pneumonia. Electronically Signed   By: Ilona Sorrel M.D.   On: 03/22/2019 18:17   DG Chest Port 1 View  Result Date: 03/18/2019 CLINICAL DATA:  85 year old female with sepsis. EXAM: PORTABLE CHEST 1 VIEW COMPARISON:  Chest radiograph dated 04/20/2017. FINDINGS: There is chronic interstitial coarsening and bronchitic changes. Overall slight interval progression of interstitial densities since the prior radiograph which may represent progression of  background of lung disease although superimposed pneumonia is not excluded. Clinical correlation is recommended. No focal consolidation or pneumothorax. Trace bilateral pleural effusions may be present. There is mild cardiomegaly. Atherosclerotic calcification of the aorta. No acute osseous pathology. IMPRESSION: Slight interval progression of interstitial densities may represent progression of lung disease versus superimposed pneumonia. Electronically Signed   By: Anner Crete M.D.   On: 03/18/2019 23:43   ECHOCARDIOGRAM COMPLETE  Result Date: 03/20/2019    ECHOCARDIOGRAM REPORT   Patient Name:   SAKAI STRANDE Date of Exam: 03/20/2019 Medical Rec #:  [id number]        Height:       66.0 in Accession #:    [id number]       Weight:       162.0 lb Date of Birth:  1934-01-20        BSA:          1.828 m Patient Age:    56 years         BP:           57/93 mmHg Patient Gender: F                HR:           114 bpm. Exam Location:  [facility] Procedure: 2D Echo, Cardiac Doppler and Color Doppler Indications:     Dyspnea 786.09  History:         Patient has no prior history of Echocardiogram examinations.                  CAD, COPD; Risk Factors:Hypertension. CVA.  Sonographer:     Sherrie Sport RDCS (AE) Referring Phys:  [id number] [facility] AMIN Diagnosing Phys: Bartholome Bill MD IMPRESSIONS  1. Left ventricular ejection fraction, by estimation, is 70 to 75%. Left ventricular ejection fraction by PLAX is 75 %. The left ventricle has hyperdynamic function. The left ventricle has no regional wall motion abnormalities. Left ventricular diastolic parameters are consistent with Grade I diastolic dysfunction (impaired relaxation).  2. Right ventricular systolic function is normal. The right ventricular size is mildly enlarged. There is moderately elevated pulmonary artery systolic pressure.  3. Left atrial size was mildly dilated.  4. Right atrial size was mildly dilated.  5. The mitral valve is grossly normal. Mild mitral valve  regurgitation.  6. The aortic valve is grossly normal. Aortic valve regurgitation is trivial. FINDINGS  Left Ventricle: Left ventricular ejection fraction, by estimation, is 70 to 75%. Left ventricular ejection fraction by PLAX is 75 % The left ventricle has  hyperdynamic function. The left ventricle has no regional wall motion abnormalities. The left ventricular internal cavity size was normal in size. There is borderline left ventricular hypertrophy. Left ventricular diastolic parameters are consistent with Grade I diastolic dysfunction (impaired relaxation). Right Ventricle: The right ventricular size is mildly enlarged. No increase in right ventricular wall thickness. Right ventricular systolic function is normal. There is moderately elevated pulmonary artery systolic pressure. The tricuspid regurgitant velocity is 3.27 m/s, and with an assumed right atrial pressure of 10 mmHg, the estimated right ventricular systolic pressure is 123XX123 mmHg. Left Atrium: Left atrial size was mildly dilated. Right Atrium: Right atrial size was mildly dilated. Pericardium: There is no evidence of pericardial effusion. Mitral Valve: The mitral valve is grossly normal. Mild mitral valve regurgitation. Tricuspid Valve: The tricuspid valve is grossly normal. Tricuspid valve regurgitation is mild. Aortic Valve: The aortic valve is grossly normal. Aortic valve regurgitation is trivial. Aortic valve mean gradient measures 3.5 mmHg. Aortic valve peak gradient measures 7.2 mmHg. Aortic valve area, by VTI measures 2.48 cm. Pulmonic Valve: The pulmonic valve was grossly normal. Pulmonic valve regurgitation is mild. Aorta: The aortic root was not well visualized. IAS/Shunts: The atrial septum is grossly normal.  LEFT VENTRICLE PLAX 2D LV EF:         Left ventricular ejection fraction by PLAX is 75 % LVIDd:         3.69 cm LVIDs:         2.09 cm LV PW:         1.35 cm LV IVS:        0.78 cm LVOT diam:     2.00 cm LV SV:         58 LV SV Index:    32 LVOT Area:     3.14 cm  RIGHT VENTRICLE RV Basal diam:  4.34 cm LEFT ATRIUM            Index       RIGHT ATRIUM           Index LA diam:      4.70 cm  2.57 cm/m  RA Area:     24.40 cm LA Vol (A2C): 158.0 ml 86.41 ml/m RA Volume:   87.20 ml  47.69 ml/m LA Vol (A4C): 52.8 ml  28.88 ml/m  AORTIC VALVE                   PULMONIC VALVE AV Area (Vmax):    2.20 cm    PV Vmax:        0.71 m/s AV Area (Vmean):   2.43 cm    PV Peak grad:   2.0 mmHg AV Area (VTI):     2.48 cm    RVOT Peak grad: 2 mmHg AV Vmax:           134.00 cm/s AV Vmean:          87.550 cm/s AV VTI:            0.234 m AV Peak Grad:      7.2 mmHg AV Mean Grad:      3.5 mmHg LVOT Vmax:         93.80 cm/s LVOT Vmean:        67.600 cm/s LVOT VTI:          0.185 m LVOT/AV VTI ratio: 0.79  AORTA Ao Root diam: 3.10 cm MITRAL VALVE  TRICUSPID VALVE MV Area (PHT): 4.89 cm     TR Peak grad:   42.8 mmHg MV Decel Time: 155 msec     TR Vmax:        327.00 cm/s MV E velocity: 127.00 cm/s MV A velocity: 128.00 cm/s  SHUNTS MV E/A ratio:  0.99         Systemic VTI:  0.18 m                             Systemic Diam: 2.00 cm Bartholome Bill MD Electronically signed by Bartholome Bill MD Signature Date/Time: 03/20/2019/4:00:44 PM    Final    ECHO TEE  Result Date: 03/21/2019    TRANSESOPHOGEAL ECHO REPORT   Patient Name:   MARYLEN KEHS Date of Exam: 03/21/2019 Medical Rec #:  [id number]        Height:       66.0 in Accession #:    [id number]       Weight:       238.5 lb Date of Birth:  1934/12/11        BSA:          2.155 m Patient Age:    27 years         BP:           107/40 mmHg Patient Gender: F                HR:           111 bpm. 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 COPD; Risk Factors:Hypertension and Diabetes.                   CVA.  Sonographer:     Sherrie Sport RDCS (AE) Referring Phys:  [place] Diagnosing Phys: Bartholome Bill MD PROCEDURE: TEE procedure time was 16 minutes. The transesophogeal probe was passed without difficulty through the esophogus of the patient. Imaged were obtained with the patient in a left lateral decubitus position. Local oropharyngeal anesthetic was provided with viscous lidocaine and Benzocaine spray. Sedation performed by performing physician. Image quality was excellent. The patient's vital signs; including heart rate, blood pressure, and oxygen saturation; remained stable throughout the procedure. The patient developed no complications during the procedure. IMPRESSIONS  1. Left ventricular ejection fraction, by estimation, is 55 to 60%. The left ventricle has normal function. The left ventricle has no regional wall motion abnormalities.  2. Right ventricular systolic function is normal. The right ventricular size is mildly enlarged.  3. No left atrial/left atrial appendage thrombus was detected.  4. Right atrial size was moderately dilated.  5. The mitral valve is grossly normal. Mild mitral valve regurgitation.  6. Tricuspid valve regurgitation is mild to moderate.  7. The aortic valve is tricuspid. Aortic valve regurgitation is trivial. No aortic stenosis is present. Conclusion(s)/Recommendation(s): No evidence of vegetation/infective endocarditis on this transesophageal echocardiogram. FINDINGS  Left Ventricle: Left ventricular ejection fraction, by estimation, is 55 to 60%. The left ventricle has normal function. The left ventricle has no regional wall motion abnormalities. The left ventricular internal cavity size was normal in size.  There is  no left ventricular hypertrophy. Right Ventricle: The right ventricular size is mildly enlarged. No increase in right ventricular wall thickness. Right ventricular systolic function is normal. Left Atrium: Left atrial size was normal in size.  No left atrial/left atrial appendage thrombus was detected. Right Atrium: Right atrial size was moderately dilated. Pericardium: There is no evidence of pericardial effusion. Mitral Valve: The mitral valve is grossly normal. Mild mitral valve regurgitation. There is no evidence of mitral valve vegetation. Tricuspid Valve: The tricuspid valve is grossly normal. Tricuspid valve regurgitation is mild to moderate. Aortic Valve: The aortic valve is tricuspid. Aortic valve regurgitation is trivial. No aortic stenosis is present. There is no evidence of aortic valve vegetation. Pulmonic Valve: The pulmonic valve was not well visualized. Pulmonic valve regurgitation is trivial. Aorta: The aortic root is normal in size and structure. IAS/Shunts: The atrial septum is grossly normal. Bartholome Bill MD Electronically signed by Bartholome Bill MD Signature Date/Time: 03/21/2019/10:50:39 AM    Final    [place] EKG SITE RITE  Result Date: 03/22/2019 If Site Rite image not attached, placement could not be confirmed due to current cardiac rhythm.   Microbiology: Recent Results (from the past 240 hour(s))  MRSA PCR Screening     Status: Abnormal   Collection Time: 03/19/19 12:25 PM   Specimen: Urine, Clean Catch; Nasopharyngeal  Result Value Ref Range Status   MRSA by PCR POSITIVE (A) NEGATIVE Final    Comment:        The GeneXpert MRSA Assay (FDA approved for NASAL specimens only), is one component of a comprehensive MRSA colonization surveillance program. It is not intended to diagnose MRSA infection nor to guide or monitor treatment for MRSA infections. RESULT CALLED TO, READ BACK BY AND VERIFIED WITH: DENISE [facility] AT [id number] 03/19/19.PMF Performed at [facility], [address] [place], [place] [address]   CULTURE, BLOOD (ROUTINE X 2) w Reflex to ID Panel     Status: None   Collection Time: 03/20/19  5:40 PM   Specimen: BLOOD  Result Value Ref Range Status   Specimen Description BLOOD LEFT ANTECUBITAL   Final   Special Requests   Final    BOTTLES DRAWN AEROBIC AND ANAEROBIC Blood Culture adequate volume   Culture   Final    NO GROWTH 5 DAYS Performed at [facility], [address] [place], [place] [address]    Report Status 03/25/2019 FINAL  Final  CULTURE, BLOOD (ROUTINE X 2) w Reflex to ID Panel     Status: None   Collection Time: 03/20/19  5:40 PM   Specimen: BLOOD  Result Value Ref Range Status   Specimen Description BLOOD BLOOD LEFT HAND  Final   Special Requests   Final    BOTTLES DRAWN AEROBIC AND ANAEROBIC Blood Culture adequate volume   Culture   Final    NO GROWTH 5 DAYS Performed at [facility], [address] [place], [place] [address]    Report Status 03/25/2019 FINAL  Final  Respiratory Panel by RT PCR (Flu A&B, Covid) - Nasopharyngeal Swab     Status: None   Collection Time: 03/27/19 10:02 AM   Specimen: Nasopharyngeal Swab  Result Value Ref Range Status   SARS Coronavirus 2 by RT PCR NEGATIVE NEGATIVE Final    Comment: (NOTE) SARS-CoV-2 target nucleic acids are NOT DETECTED. The SARS-CoV-2 RNA is generally detectable in upper respiratoy specimens during the acute phase of infection. The lowest concentration of SARS-CoV-2 viral copies  this assay can detect is 131 copies/mL. A negative result does not preclude SARS-Cov-2 infection and should not be used as the sole basis for treatment or other patient management decisions. A negative result may occur with  improper specimen collection/handling, submission of specimen other than nasopharyngeal swab, presence of viral mutation(s) within the areas targeted by this assay, and inadequate number of viral copies (<131 copies/mL). A negative result must be combined with clinical observations, patient history, and epidemiological information. The expected result is Negative. Fact Sheet for Patients:  PinkCheek.be Fact Sheet for Healthcare Providers:   GravelBags.it This test is not yet ap proved or cleared by the [place] FDA and  has been authorized for detection and/or diagnosis of SARS-CoV-2 by FDA under an Emergency Use Authorization (EUA). This EUA will remain  in effect (meaning this test can be used) for the duration of the COVID-19 declaration under Section 564(b)(1) of the Act, 21 U.S.C. section 360bbb-3(b)(1), unless the authorization is terminated or revoked sooner.    Influenza A by PCR NEGATIVE NEGATIVE Final   Influenza B by PCR NEGATIVE NEGATIVE Final    Comment: (NOTE) The Xpert Xpress SARS-CoV-2/FLU/RSV assay is intended as an aid in  the diagnosis of influenza from Nasopharyngeal swab specimens and  should not be used as a sole basis for treatment. Nasal washings and  aspirates are unacceptable for Xpert Xpress SARS-CoV-2/FLU/RSV  testing. Fact Sheet for Patients: PinkCheek.be Fact Sheet for Healthcare Providers: GravelBags.it This test is not yet approved or cleared by the [place] FDA and  has been authorized for detection and/or diagnosis of SARS-CoV-2 by  FDA under an Emergency Use Authorization (EUA). This EUA will remain  in effect (meaning this test can be used) for the duration of the  Covid-19 declaration under Section 564(b)(1) of the Act, 21  U.S.C. section 360bbb-3(b)(1), unless the authorization is  terminated or revoked. Performed at [facility], [address] [place],  [address]      Labs: CBC: Recent Labs  Lab 03/23/19 0500 03/24/19 0500 03/26/19 1022 03/27/19 0549 03/28/19 0957  WBC 5.2 5.2  --  4.9 6.3  NEUTROABS 2.9 2.9  --   --   --   HGB 8.6* 8.5* 9.1* 8.0* 8.2*  HCT 26.6* 26.4* 28.1* 24.7* 25.7*  MCV 104.7* 105.2*  --  105.1* 106.2*  PLT 90* 114*  --  142* [id number]   Basic Metabolic Panel: Recent Labs  Lab 03/24/19 0500 03/24/19 0500 03/24/19 1235  03/25/19 1312 03/26/19 0627 03/27/19 0549 03/28/19 0356 03/28/19 0957 03/29/19 0410  NA 136  --   --   --  135 136 136  --  136  K 3.9   < > 4.1  --  4.0 4.0 3.9  --  3.7  CL 105  --   --   --  103 103 102  --  101  CO2 25  --   --   --  27 28 29   --  29  GLUCOSE 96  --   --   --  99 98 111*  --  108*  BUN 21  --   --   --  16 15 16   --  16  CREATININE 0.90  0.84  --   --   --  0.75 0.77 0.92  --  0.83  CALCIUM 7.4*  --   --   --  7.6* 7.5* 7.5*  --  7.4*  MG 1.2*   < >  --  1.5* 1.7 1.7  --  1.8 1.6*  PHOS 1.8*  --   --  2.0* 2.4* 2.6 2.8  --   --    < > = values in this interval not displayed.   Liver Function Tests: Recent Labs  Lab 03/24/19 0500  AST 39  ALT 12  ALKPHOS 82  BILITOT 1.3*  PROT 6.1*  ALBUMIN 1.9*   No results for input(s): LIPASE, AMYLASE in the last 168 hours. No results for input(s): AMMONIA in the last 168 hours. Cardiac Enzymes: No results for input(s): CKTOTAL, CKMB, CKMBINDEX, TROPONINI in the last 168 hours. BNP (last 3 results) Recent Labs    03/26/19 1022 03/27/19 0549 03/28/19 0957  BNP 293.0* 426.0* 332.0*   CBG: Recent Labs  Lab 03/28/19 0741 03/28/19 1218 03/28/19 1654 03/28/19 2059 03/29/19 0744  GLUCAP 100* 130* 126* 121* 95    Time spent: 35 minutes  Signed:  Berle Mull  [facility]  03/29/2019 9:32 AM

## 2019-03-29 NOTE — Plan of Care (Signed)
  Problem: Activity: Goal: Risk for activity intolerance will decrease Outcome: Not Progressing   

## 2019-03-30 ENCOUNTER — Telehealth: Payer: Self-pay

## 2019-03-30 ENCOUNTER — Telehealth: Payer: Self-pay | Admitting: Internal Medicine

## 2019-03-30 ENCOUNTER — Telehealth: Payer: Self-pay | Admitting: Family

## 2019-03-30 NOTE — Telephone Encounter (Signed)
Called patients daughter and confirmed confusion is not acute. Doing ok. Got home today. Advised that they should keep appt on 3/31

## 2019-03-30 NOTE — Telephone Encounter (Signed)
Unable to reach regarding new patient appointment for 3/26 at the [facility]. LVM for patient to call back to confirm.    Brooke Baker, [place]

## 2019-03-30 NOTE — Telephone Encounter (Signed)
Patient no show for mychart video on 03/27/2019.

## 2019-03-30 NOTE — Progress Notes (Deleted)
   Patient ID: Brooke Baker, female    DOB: 10-16-1934, 85 y.o.   MRN: [id number]  HPI  Brooke Baker is a 85 y/o female with a history of  Echo report from 03/21/19 reviewed and showed an EF of 55-60% along with mild MR and mild/moderate TR.   Admitted 03/18/19 due to sepsis secondary to MRSA bacteremia and bilateral pneumonia along with acute on chronic HF. Cardiology, hematology and ID consults obtained. TEE negative for vegetation. PICC line placed for 4 weeks of antibiotics. Initially diureses but then diuretic was stopped due to worsening renal function. Discharged after 11 days to SNF.   She presents today for her initial visit with a chief complaint of  Review of Systems    Physical Exam  Assessment & Plan:  1: Chronic heart failure with preserved ejection fraction- - NYHA class - saw cardiology ([facility]) 02/13/19 - BNP 03/28/19 was 332.0  2: HTN- - BP - saw PCP Nicki Reaper) 11/08/2018 and now sees PCP at facility - [facility] 03/29/19 reviewed and showed sodium 136, potassium 3.7, creatinine 0.83 and GFR >60  3: DM- - A1c 01/19/19 was 4.8%

## 2019-03-30 NOTE — Telephone Encounter (Signed)
[facility] needs to verify lab orders from Dr. Nicki Reaper for when pt goes home. They would like a call back.  [phone]

## 2019-03-30 NOTE — Telephone Encounter (Signed)
Patients daughter is going to let us know if needs something prior to appt.

## 2019-03-30 NOTE — Telephone Encounter (Signed)
[facility] called and scheduled a  HFU/Rehab follow up. I scheduled that appt. 04/05/19 Pt daughter called they are needing some home health care to come in and help. She stated that her mother is getting very confused and also cant remember who people are  Please contact pt daughter [name] [phone]

## 2019-03-31 ENCOUNTER — Telehealth: Payer: Self-pay | Admitting: Family

## 2019-03-31 ENCOUNTER — Ambulatory Visit: Payer: Medicare PPO | Admitting: Family

## 2019-03-31 ENCOUNTER — Telehealth: Payer: Self-pay

## 2019-03-31 DIAGNOSIS — J189 Pneumonia, unspecified organism: Secondary | ICD-10-CM | POA: Diagnosis not present

## 2019-03-31 DIAGNOSIS — R0902 Hypoxemia: Secondary | ICD-10-CM | POA: Diagnosis not present

## 2019-03-31 DIAGNOSIS — J449 Chronic obstructive pulmonary disease, unspecified: Secondary | ICD-10-CM | POA: Diagnosis not present

## 2019-03-31 NOTE — Telephone Encounter (Signed)
Advised that ID is going to be following up with patient. Brooke Baker is going to call pt to get name of ID doctor who is going to be following her. Will follow up Monday to confirm was able to get in touch

## 2019-03-31 NOTE — Telephone Encounter (Signed)
Patient did not show for her [facility] appointment on 03/31/19. Will attempt to reschedule.

## 2019-03-31 NOTE — Telephone Encounter (Signed)
Infusion pharmacy would like to know if it would be acceptable to draw CBC with diff, CMP, and a vanc troth every Monday on pt. She is on vancomycin until 4/10. Was just d/c'd home from [facility]

## 2019-03-31 NOTE — Telephone Encounter (Addendum)
Transition Care Management Follow-up Telephone Call  Date of discharge and from where: 03/29/19 from [facility] to [facility] to Home on 03/30/19  How have you been since you were released from the hospital? Information received from daughter, [name], HIPAA compliant. States patient is weak, confused worse at evening/night, wants to sleep most of the time and notes she is having crazy dreams. No appetite. Plans to add Boost or Premier Protein daily to icecream as a supplement. Denies pain, dizziness, nausea, headaches and all other symptoms. 4L Oxygen daily. Inhaler in use as needed. Daughter notes no acute issues or follow up needed prior to scheduled appointment. Encouraged to keep appointment and reach out if anything changes and as needed.  Any questions or concerns? Requests Home Health and/or [facility] for daily assistance with care.   Items Reviewed:  Did the pt receive and understand the discharge instructions provided? Yes, increase activity as tolerated.  Medications obtained and verified? Yes, daughter manages medications. No questions or concerns.  Any new allergies since your discharge? No  Dietary orders reviewed? Yes, low sodium, heart healthy  Do you have support at home? Yes, husband and daughter  Functional Questionnaire: (I = Independent and D = Dependent) ADLs: D- husband and daughter assist   Eating- I  Maintaining continence- I with BSC  Transferring/Ambulation- Walker  Managing Meds- D- daughter manages  Follow up appointments reviewed:   PCP Hospital f/u appt confirmed?  Scheduled to see Dr. Nicki Reaper on 04/05/19 @ 11:30 via virtual [phone].  Cardiology (3/31), Oncology (4/7) and Infectious Disease Control (4/14) f/u appt confirmed? Yes.  Are transportation arrangements needed? No  If their condition worsens, is the pt aware to call PCP or go to the Emergency Dept.? Yes  Was the patient provided with contact information for the PCP's office  or ED? Yes  Was to pt encouraged to call back with questions or concerns? Yes

## 2019-03-31 NOTE — Telephone Encounter (Signed)
Need to clarify with them, my understanding is that Infectious disease is going to be following her and they are following her vancomycin treatments.   Will need to get orders from them.  Let me know if this is a problem.

## 2019-04-02 DIAGNOSIS — J449 Chronic obstructive pulmonary disease, unspecified: Secondary | ICD-10-CM | POA: Diagnosis not present

## 2019-04-03 ENCOUNTER — Other Ambulatory Visit: Payer: Medicare PPO

## 2019-04-03 DIAGNOSIS — A4102 Sepsis due to Methicillin resistant Staphylococcus aureus: Secondary | ICD-10-CM | POA: Diagnosis not present

## 2019-04-03 DIAGNOSIS — J440 Chronic obstructive pulmonary disease with acute lower respiratory infection: Secondary | ICD-10-CM | POA: Diagnosis not present

## 2019-04-03 DIAGNOSIS — J181 Lobar pneumonia, unspecified organism: Secondary | ICD-10-CM | POA: Diagnosis not present

## 2019-04-03 DIAGNOSIS — I480 Paroxysmal atrial fibrillation: Secondary | ICD-10-CM | POA: Diagnosis not present

## 2019-04-03 DIAGNOSIS — I5033 Acute on chronic diastolic (congestive) heart failure: Secondary | ICD-10-CM | POA: Diagnosis not present

## 2019-04-03 DIAGNOSIS — J4540 Moderate persistent asthma, uncomplicated: Secondary | ICD-10-CM | POA: Diagnosis not present

## 2019-04-03 DIAGNOSIS — E1140 Type 2 diabetes mellitus with diabetic neuropathy, unspecified: Secondary | ICD-10-CM | POA: Diagnosis not present

## 2019-04-03 DIAGNOSIS — J189 Pneumonia, unspecified organism: Secondary | ICD-10-CM | POA: Diagnosis not present

## 2019-04-03 DIAGNOSIS — I110 Hypertensive heart disease with heart failure: Secondary | ICD-10-CM | POA: Diagnosis not present

## 2019-04-03 DIAGNOSIS — I50810 Right heart failure, unspecified: Secondary | ICD-10-CM | POA: Diagnosis not present

## 2019-04-03 NOTE — Telephone Encounter (Signed)
Called pt to get name of ID provider she will be seeing. Unable to reach.

## 2019-04-04 NOTE — Telephone Encounter (Signed)
Patient daughter stated she does not know the name of the ID doctor but she thinks that his name was Dr Ola Spurr. Labs were drawn by [facility] yesterday and will be forwarded to ID. Nothing needed from [place].

## 2019-04-04 NOTE — Telephone Encounter (Signed)
Noted.  Can confirm with infusion center - they have received orders.

## 2019-04-04 NOTE — Telephone Encounter (Signed)
Spoke with Dorthy Cooler from home health. Labs were drawn but had nowhere to send them. No appt sch with ID. Called [facility] and scheduled appt for 4/6. Provided contact information to home health and infusion pharmacy so they can send labs to ID. Will see pt tomorrow for HFU

## 2019-04-05 ENCOUNTER — Telehealth: Payer: Self-pay | Admitting: Oncology

## 2019-04-05 ENCOUNTER — Telehealth: Payer: Self-pay

## 2019-04-05 ENCOUNTER — Telehealth (INDEPENDENT_AMBULATORY_CARE_PROVIDER_SITE_OTHER): Payer: Medicare PPO | Admitting: Internal Medicine

## 2019-04-05 DIAGNOSIS — E1140 Type 2 diabetes mellitus with diabetic neuropathy, unspecified: Secondary | ICD-10-CM | POA: Diagnosis not present

## 2019-04-05 DIAGNOSIS — R7881 Bacteremia: Secondary | ICD-10-CM | POA: Diagnosis not present

## 2019-04-05 DIAGNOSIS — I480 Paroxysmal atrial fibrillation: Secondary | ICD-10-CM

## 2019-04-05 DIAGNOSIS — I10 Essential (primary) hypertension: Secondary | ICD-10-CM | POA: Diagnosis not present

## 2019-04-05 DIAGNOSIS — R531 Weakness: Secondary | ICD-10-CM

## 2019-04-05 DIAGNOSIS — I509 Heart failure, unspecified: Secondary | ICD-10-CM | POA: Diagnosis not present

## 2019-04-05 DIAGNOSIS — J9610 Chronic respiratory failure, unspecified whether with hypoxia or hypercapnia: Secondary | ICD-10-CM

## 2019-04-05 DIAGNOSIS — E785 Hyperlipidemia, unspecified: Secondary | ICD-10-CM

## 2019-04-05 DIAGNOSIS — B9562 Methicillin resistant Staphylococcus aureus infection as the cause of diseases classified elsewhere: Secondary | ICD-10-CM

## 2019-04-05 DIAGNOSIS — D696 Thrombocytopenia, unspecified: Secondary | ICD-10-CM

## 2019-04-05 DIAGNOSIS — L4050 Arthropathic psoriasis, unspecified: Secondary | ICD-10-CM

## 2019-04-05 DIAGNOSIS — E1169 Type 2 diabetes mellitus with other specified complication: Secondary | ICD-10-CM | POA: Diagnosis not present

## 2019-04-05 DIAGNOSIS — J4540 Moderate persistent asthma, uncomplicated: Secondary | ICD-10-CM | POA: Diagnosis not present

## 2019-04-05 DIAGNOSIS — G40909 Epilepsy, unspecified, not intractable, without status epilepticus: Secondary | ICD-10-CM

## 2019-04-05 DIAGNOSIS — A4102 Sepsis due to Methicillin resistant Staphylococcus aureus: Secondary | ICD-10-CM

## 2019-04-05 DIAGNOSIS — J189 Pneumonia, unspecified organism: Secondary | ICD-10-CM | POA: Diagnosis not present

## 2019-04-05 LAB — CBC AND DIFFERENTIAL
HCT: 25 — AB (ref 36–46)
Hemoglobin: 8.5 — AB (ref 12.0–16.0)
Neutrophils Absolute: 2
Platelets: 121 — AB (ref 150–399)
WBC: 5.1

## 2019-04-05 LAB — BASIC METABOLIC PANEL
BUN: 24 — AB (ref 4–21)
CO2: 25 — AB (ref 13–22)
Chloride: 100 (ref 99–108)
Creatinine: 2.4 — AB (ref 0.5–1.1)
Glucose: 76
Potassium: 4.2 (ref 3.4–5.3)
Sodium: 137 (ref 137–147)

## 2019-04-05 LAB — COMPREHENSIVE METABOLIC PANEL: Calcium: 7.3 — AB (ref 8.7–10.7)

## 2019-04-05 LAB — CBC: RBC: 2.42 — AB (ref 3.87–5.11)

## 2019-04-05 NOTE — Telephone Encounter (Signed)
Called and left message for Dr Gwenevere Ghazi office to call me back. Need to cancel appt that was made with them and schedule appt with Dr Ola Spurr for next week. Called Dr Blane Ohara office to schedule appt. Advised needed to be virtual. Scheduler stated that she cannot schedule HFU virtual. Advised that pt had HFU with PCP today. She needs f/u with ID to follow vanc levels. Scheduler advised that she would talk with Dr Ola Spurr and contact pt to schedule. Gave daughters contact information.

## 2019-04-05 NOTE — Telephone Encounter (Signed)
LM on cell of  daughter about appt on 04/11/19 with [facility] ID Ravishankar at 1130.

## 2019-04-05 NOTE — Progress Notes (Signed)
Patient ID: Brooke Baker, female   DOB: 1934-09-07, 85 y.o.   MRN: [id number]   Virtual Visit via video Note  This visit type was conducted due to national recommendations for restrictions regarding the COVID-19 pandemic (e.g. social distancing).  This format is felt to be most appropriate for this patient at this time.  All issues noted in this document were discussed and addressed.  No physical exam was performed (except for noted visual exam findings with Video Visits).   I connected with Marjory Meints by a video enabled telemedicine application and verified that I am speaking with the correct person using two identifiers. Location patient: home Location provider: work Persons participating in the virtual visit: patient, provider  The limitations, risks, security and privacy concerns of performing an evaluation and management service by video and the availability of in person appointments have been discussed.  The patient expressed understanding and agreed to proceed.   Reason for visit: hospital follow up.   HPI: Patient being seen for hospital/rehab follow up.  She was admitted 03/18/19 - 03/29/19.  Presented with fever, weakness, nausea, vomiting and sob.  Had been having orthopnea and worsening lower extremity edema.  cxr revealed interval progression of interstitial densities.  EKG - afib with RVR.  Was started on IV cefepime and vancomycin.  Diagnosed with sepsis secondary to MRSA bacteremia and bilateral pneumonia.  TTE and TEE - negative for vegetations.  ID consulted and recommended 4 weeks of vancomycin.  PICC line placed.  She was also initially diuresed with lasix 47m bid.  ECHO - normal EF.  Per note, blood pressure ok and recommended to hold losartan and amlodipine.  She was continued on metoprolol.  Per report, discharged on lasix q day.  Placed on pradaxa for afib.  Found to be anemic and thrombocytopenic.  Hematology consulted and felt to be related to MDS.  Recommended  continued f/u of her platelet count to confirm levels off.  Planing for f/u with hematology.  [facility]  Was only [facility] hours.  Family had concerns regarding her care and pt was transferred home.  Since being home, daughter reports pt initially confused.  Mentation is better today.  She is not able to get out of bed.  Daughter and husband are trying to get her to bedside commode.  Has continuous oxygen.  Decreased appetite, but is eating.  Pt denies any pain.  Some cough at times, but pt reports no severe cough.  No nausea or vomiting.  Stools are dark, but no BRB or back tarry stools.  No abdominal pain.  Is receiving IV vancomycin through PICC line.  Recent labs with creatinine 2.4.  Vancomycin was held today.  Daughter had questions about therapy and help in the home.  Currently does not have home health.     ROS: See pertinent positives and negatives per HPI.  Past Medical History:  Diagnosis Date  . Acute bronchitis   . Allergic rhinitis   . Arthritis   . CAD (coronary artery disease)    s/p stent LAD 8/03 and stent placement OM1  . Cancer ([facility]    skin  . Chronic back pain   . COPD with asthma ([facility]   . CVA (cerebral vascular accident) ([facility]   . Diabetes mellitus ([facility]   . Hypercholesterolemia   . Hypertension   . Seizure disorder ([facility]     Past Surgical History:  Procedure Laterality Date  . BREAST BIOPSY Bilateral  axillas  . CORONARY STENT PLACEMENT  8/03   LAD  . CORONARY STENT PLACEMENT     OM1  2006  . FEMUR FRACTURE SURGERY Right [id number]   Dr. Marry Guan  . LUMBAR SPINE SURGERY     x3  . TEE WITHOUT CARDIOVERSION N/A 03/21/2019   Procedure: TRANSESOPHAGEAL ECHOCARDIOGRAM (TEE);  Surgeon: Teodoro Spray, MD;  Location: [facility] ORS;  Service: Cardiovascular;  Laterality: N/A;  . TOTAL ABDOMINAL HYSTERECTOMY     appendectomy - age 25  . TOTAL HIP ARTHROPLASTY    . TOTAL KNEE ARTHROPLASTY     2005    Family History  Problem Relation Age  of Onset  . Allergies Brother   . Cancer Brother   . Allergies Sister   . Cancer Sister        Non-hodgkins lymphoma  . Asthma Sister   . Rheum arthritis Sister   . Stroke Mother   . Asthma Brother   . Rheum arthritis Brother   . Cancer Sister        non-hodgkins lymphoma  . Diabetes Brother     SOCIAL HX: reviewed.    Current Outpatient Medications:  .  albuterol (PROVENTIL) (2.5 MG/3ML) 0.083% nebulizer solution, USE ONE VIAL VIA NEBULZIER EVERY FOUR HOURS AS NEEDED FOR WHEEZING, Disp: 540 mL, Rfl: 1 .  albuterol (VENTOLIN HFA) 108 (90 Base) MCG/ACT inhaler, USE 2 PUFFS EVERY 4 HOURS AS DIRECTED - RESCUE (Patient taking differently: Inhale 2 puffs into the lungs every 4 (four) hours as needed for wheezing or shortness of breath. ), Disp: 18 g, Rfl: 1 .  apixaban (ELIQUIS) 2.5 MG TABS tablet, Take 1 tablet (2.5 mg total) by mouth 2 (two) times daily., Disp: 60 tablet, Rfl: 1 .  budesonide-formoterol (SYMBICORT) 80-4.5 MCG/ACT inhaler, INHALE 2 PUFFS TWICE A DAY (Patient taking differently: Inhale 2 puffs into the lungs 2 (two) times daily. ), Disp: 10.2 g, Rfl: 3 .  furosemide (LASIX) 40 MG tablet, Take 1 tablet (40 mg total) by mouth daily., Disp: 30 tablet, Rfl: 11 .  gabapentin (NEURONTIN) 100 MG capsule, TAKE ONE CAPSULE BY MOUTH TWICE A DAY (Patient taking differently: Take 200 mg by mouth at bedtime. ), Disp: 60 capsule, Rfl: 1 .  levETIRAcetam (KEPPRA) 500 MG tablet, Take 1 tablet (500 mg total) by mouth 2 (two) times daily., Disp: 60 tablet, Rfl: 1 .  linezolid (ZYVOX) 600 MG tablet, Take 1 tablet (600 mg total) by mouth every 12 (twelve) hours for 3 days., Disp: 6 tablet, Rfl: 0 .  metoprolol succinate (TOPROL-XL) 25 MG 24 hr tablet, Take 1 tablet (25 mg total) by mouth daily., Disp: 30 tablet, Rfl: 0 .  Omega-3 Fatty Acids (FISH OIL) 1000 MG CAPS, Take 1,000 mg by mouth daily. , Disp: , Rfl:  .  phenytoin (DILANTIN) 100 MG ER capsule, Take 4 capsules (400 mg total) by mouth  at bedtime., Disp: 360 capsule, Rfl: 0 .  polyethylene glycol (MIRALAX / GLYCOLAX) 17 g packet, Take 17 g by mouth daily as needed for mild constipation or moderate constipation. , Disp: , Rfl:  .  potassium chloride SA (KLOR-CON) 20 MEQ tablet, Take 1 tablet (20 mEq total) by mouth daily., Disp: 30 tablet, Rfl: 1 .  rosuvastatin (CRESTOR) 20 MG tablet, TAKE 1 TABLET BY MOUTH DAILY (Patient taking differently: Take 20 mg by mouth daily. ), Disp: 30 tablet, Rfl: 5 .  sertraline (ZOLOFT) 50 MG tablet, Take 75 mg by mouth at bedtime. , Disp: ,  Rfl:   EXAM:  GENERAL: alert, appears well and in no acute distress, answering questions appropriately.    HEENT: atraumatic, conjunttiva clear, no obvious abnormalities on inspection of external nose and ears  NECK: normal movements of the head and neck  LUNGS: on inspection no signs of respiratory distress, breathing rate appears normal, no obvious gross SOB, gasping or wheezing  CV: no obvious cyanosis  PSYCH/NEURO: pleasant and cooperative, no obvious depression or anxiety, speech and thought processing grossly intact  ASSESSMENT AND PLAN:  Discussed the following assessment and plan:  AF (paroxysmal atrial fibrillation) ([place]) Recent admission as outlined.  ECHO - normal EF.  No reports of increased heart rate or palpitations.    Asthma, moderate persistent No increased cough or congestion.  On continuous oxygen.  Follow.    Chronic diastolic CHF (congestive heart failure) ([facility]) On lasix.  Increased creatinine.  Hold lasix.  Follow.    Chronic respiratory failure ([facility]) On continuous oxygen.  Follow.    Community acquired pneumonia Recently admitted and treated with IV cefepime and vancomycin.  Was discharged on IV vancomycin.  Has PICC line.  vanc level increased.  Creatinine increased.  Hold vancomycin. Spoke to ID.  Hold vancomycin and recheck creatinine and vancomycin level tomorrow.    Diabetes mellitus ([place]) Low carb diet and  exercise.  Follow met b and a1c.   Hyperlipidemia associated with type 2 diabetes mellitus ([place]) On crestor.  Follow lipid panel and liver function tests.    Hypertension associated with diabetes ([place]) Blood pressure as outlined.  Taking toprol and lasix.  Hold lasix.  Follow metabolic panel.   MRSA bacteremia Receiving IV vancomycin.  Discussed with ID.  Creatinine increased.  Vancomycin level elevated.  Hold vancomycin.  Recheck vanc level and metabolic panel tomorrow.  Mentation better today - per daughter.  Follow.    Psoriatic arthritis ([place]) Has been evaluated by rheumatology.   Seizure disorder ([place]) Followed by neurology.  On keppra and dilantin.    Sepsis ([place]) Recently admitted with sepsis as outlined.  MRSA bacteremia.  On IV vancomycin as outlined.  Hold given increased creatinine.    Thrombocytopenia ([place]) Recheck cbc to confirm platelet cough stable/improved.    Weakness Weakness.  Needs help getting up to bedside commode, etc.  Will have PT evaluate and treat.     Orders Placed This Encounter  Procedures  . Ambulatory referral to Home Health    Referral Priority:   Urgent    Referral Type:   Home Health Care    Referral Reason:   Specialty Services Required    Requested Specialty:   [place]    Number of Visits Requested:   1    No orders of the defined types were placed in this encounter.    I discussed the assessment and treatment plan with the patient. The patient was provided an opportunity to ask questions and all were answered. The patient agreed with the plan and demonstrated an understanding of the instructions.   The patient was advised to call back or seek an in-person evaluation if the symptoms worsen or if the condition fails to improve as anticipated.  I provided 45 minutes of non-face-to-face time during this encounter.    Einar Pheasant, MD

## 2019-04-05 NOTE — Telephone Encounter (Signed)
Rcvd VM regarding patients meds from Dr, Nicki Reaper. Called office back [phone] and cell [phone] no answer no vm.

## 2019-04-05 NOTE — Telephone Encounter (Signed)
Writer spoke with daughter who stated that patient is too weak to come to the office and asked about doing a virtual visit. Writer mentioned that patient would need lab work prior to appt and daughter stated that patient's [facility] agency drew labs on 04-03-19. Writer explained that there was a possibility that MD would want other labs, but that we could look to see if the [facility] agency could draw the requested labs and then change MD appt to virtual. At this time, appts for 04-06-19 have been cancelled pending above plan. Daughter is agreeable with the above.

## 2019-04-06 ENCOUNTER — Other Ambulatory Visit: Payer: Self-pay

## 2019-04-06 ENCOUNTER — Inpatient Hospital Stay: Payer: Medicare PPO | Admitting: Oncology

## 2019-04-06 ENCOUNTER — Emergency Department: Payer: Medicare PPO

## 2019-04-06 ENCOUNTER — Inpatient Hospital Stay: Payer: Medicare PPO

## 2019-04-06 ENCOUNTER — Telehealth: Payer: Medicare PPO | Admitting: Oncology

## 2019-04-06 ENCOUNTER — Inpatient Hospital Stay
Admission: EM | Admit: 2019-04-06 | Discharge: 2019-04-14 | DRG: 682 | Disposition: A | Discharge status: Home Health | Payer: Medicare PPO | Attending: Internal Medicine | Admitting: Internal Medicine

## 2019-04-06 ENCOUNTER — Telehealth: Payer: Self-pay

## 2019-04-06 DIAGNOSIS — I5033 Acute on chronic diastolic (congestive) heart failure: Secondary | ICD-10-CM | POA: Diagnosis present

## 2019-04-06 DIAGNOSIS — J90 Pleural effusion, not elsewhere classified: Secondary | ICD-10-CM

## 2019-04-06 DIAGNOSIS — Z7189 Other specified counseling: Secondary | ICD-10-CM

## 2019-04-06 DIAGNOSIS — D539 Nutritional anemia, unspecified: Secondary | ICD-10-CM | POA: Diagnosis not present

## 2019-04-06 DIAGNOSIS — E876 Hypokalemia: Secondary | ICD-10-CM | POA: Diagnosis not present

## 2019-04-06 DIAGNOSIS — D649 Anemia, unspecified: Secondary | ICD-10-CM | POA: Diagnosis not present

## 2019-04-06 DIAGNOSIS — R069 Unspecified abnormalities of breathing: Secondary | ICD-10-CM | POA: Diagnosis not present

## 2019-04-06 DIAGNOSIS — E875 Hyperkalemia: Secondary | ICD-10-CM | POA: Diagnosis present

## 2019-04-06 DIAGNOSIS — I9589 Other hypotension: Secondary | ICD-10-CM | POA: Diagnosis not present

## 2019-04-06 DIAGNOSIS — E861 Hypovolemia: Secondary | ICD-10-CM | POA: Diagnosis not present

## 2019-04-06 DIAGNOSIS — I5032 Chronic diastolic (congestive) heart failure: Secondary | ICD-10-CM | POA: Diagnosis not present

## 2019-04-06 DIAGNOSIS — N6332 Unspecified lump in axillary tail of the left breast: Secondary | ICD-10-CM | POA: Diagnosis not present

## 2019-04-06 DIAGNOSIS — Z825 Family history of asthma and other chronic lower respiratory diseases: Secondary | ICD-10-CM

## 2019-04-06 DIAGNOSIS — Z96649 Presence of unspecified artificial hip joint: Secondary | ICD-10-CM | POA: Diagnosis present

## 2019-04-06 DIAGNOSIS — Y929 Unspecified place or not applicable: Secondary | ICD-10-CM | POA: Diagnosis not present

## 2019-04-06 DIAGNOSIS — I959 Hypotension, unspecified: Secondary | ICD-10-CM | POA: Diagnosis not present

## 2019-04-06 DIAGNOSIS — Z8673 Personal history of transient ischemic attack (TIA), and cerebral infarction without residual deficits: Secondary | ICD-10-CM

## 2019-04-06 DIAGNOSIS — J45909 Unspecified asthma, uncomplicated: Secondary | ICD-10-CM | POA: Diagnosis not present

## 2019-04-06 DIAGNOSIS — Z7951 Long term (current) use of inhaled steroids: Secondary | ICD-10-CM

## 2019-04-06 DIAGNOSIS — E8770 Fluid overload, unspecified: Secondary | ICD-10-CM | POA: Diagnosis not present

## 2019-04-06 DIAGNOSIS — D696 Thrombocytopenia, unspecified: Secondary | ICD-10-CM | POA: Diagnosis not present

## 2019-04-06 DIAGNOSIS — A419 Sepsis, unspecified organism: Secondary | ICD-10-CM | POA: Diagnosis not present

## 2019-04-06 DIAGNOSIS — Z8261 Family history of arthritis: Secondary | ICD-10-CM

## 2019-04-06 DIAGNOSIS — Z96651 Presence of right artificial knee joint: Secondary | ICD-10-CM | POA: Diagnosis present

## 2019-04-06 DIAGNOSIS — I2510 Atherosclerotic heart disease of native coronary artery without angina pectoris: Secondary | ICD-10-CM | POA: Diagnosis present

## 2019-04-06 DIAGNOSIS — I890 Lymphedema, not elsewhere classified: Secondary | ICD-10-CM | POA: Diagnosis present

## 2019-04-06 DIAGNOSIS — N170 Acute kidney failure with tubular necrosis: Principal | ICD-10-CM | POA: Diagnosis present

## 2019-04-06 DIAGNOSIS — R680 Hypothermia, not associated with low environmental temperature: Secondary | ICD-10-CM | POA: Diagnosis not present

## 2019-04-06 DIAGNOSIS — J849 Interstitial pulmonary disease, unspecified: Secondary | ICD-10-CM | POA: Diagnosis not present

## 2019-04-06 DIAGNOSIS — E1169 Type 2 diabetes mellitus with other specified complication: Secondary | ICD-10-CM | POA: Diagnosis present

## 2019-04-06 DIAGNOSIS — J441 Chronic obstructive pulmonary disease with (acute) exacerbation: Secondary | ICD-10-CM | POA: Diagnosis present

## 2019-04-06 DIAGNOSIS — Z95828 Presence of other vascular implants and grafts: Secondary | ICD-10-CM | POA: Diagnosis not present

## 2019-04-06 DIAGNOSIS — I152 Hypertension secondary to endocrine disorders: Secondary | ICD-10-CM | POA: Diagnosis present

## 2019-04-06 DIAGNOSIS — A4102 Sepsis due to Methicillin resistant Staphylococcus aureus: Secondary | ICD-10-CM | POA: Diagnosis not present

## 2019-04-06 DIAGNOSIS — R918 Other nonspecific abnormal finding of lung field: Secondary | ICD-10-CM | POA: Diagnosis not present

## 2019-04-06 DIAGNOSIS — N179 Acute kidney failure, unspecified: Secondary | ICD-10-CM | POA: Diagnosis not present

## 2019-04-06 DIAGNOSIS — Z7401 Bed confinement status: Secondary | ICD-10-CM | POA: Diagnosis not present

## 2019-04-06 DIAGNOSIS — R32 Unspecified urinary incontinence: Secondary | ICD-10-CM | POA: Diagnosis not present

## 2019-04-06 DIAGNOSIS — J9601 Acute respiratory failure with hypoxia: Secondary | ICD-10-CM | POA: Diagnosis not present

## 2019-04-06 DIAGNOSIS — E1159 Type 2 diabetes mellitus with other circulatory complications: Secondary | ICD-10-CM | POA: Diagnosis not present

## 2019-04-06 DIAGNOSIS — Z515 Encounter for palliative care: Secondary | ICD-10-CM | POA: Diagnosis not present

## 2019-04-06 DIAGNOSIS — Z9981 Dependence on supplemental oxygen: Secondary | ICD-10-CM

## 2019-04-06 DIAGNOSIS — B9562 Methicillin resistant Staphylococcus aureus infection as the cause of diseases classified elsewhere: Secondary | ICD-10-CM | POA: Diagnosis not present

## 2019-04-06 DIAGNOSIS — Z20822 Contact with and (suspected) exposure to covid-19: Secondary | ICD-10-CM | POA: Diagnosis not present

## 2019-04-06 DIAGNOSIS — E7800 Pure hypercholesterolemia, unspecified: Secondary | ICD-10-CM | POA: Diagnosis present

## 2019-04-06 DIAGNOSIS — D631 Anemia in chronic kidney disease: Secondary | ICD-10-CM | POA: Diagnosis not present

## 2019-04-06 DIAGNOSIS — Z9889 Other specified postprocedural states: Secondary | ICD-10-CM

## 2019-04-06 DIAGNOSIS — I480 Paroxysmal atrial fibrillation: Secondary | ICD-10-CM | POA: Diagnosis not present

## 2019-04-06 DIAGNOSIS — R7881 Bacteremia: Secondary | ICD-10-CM | POA: Insufficient documentation

## 2019-04-06 DIAGNOSIS — E119 Type 2 diabetes mellitus without complications: Secondary | ICD-10-CM | POA: Diagnosis not present

## 2019-04-06 DIAGNOSIS — J9621 Acute and chronic respiratory failure with hypoxia: Secondary | ICD-10-CM | POA: Diagnosis present

## 2019-04-06 DIAGNOSIS — Z955 Presence of coronary angioplasty implant and graft: Secondary | ICD-10-CM

## 2019-04-06 DIAGNOSIS — J189 Pneumonia, unspecified organism: Secondary | ICD-10-CM | POA: Diagnosis not present

## 2019-04-06 DIAGNOSIS — J9610 Chronic respiratory failure, unspecified whether with hypoxia or hypercapnia: Secondary | ICD-10-CM | POA: Diagnosis not present

## 2019-04-06 DIAGNOSIS — M2550 Pain in unspecified joint: Secondary | ICD-10-CM | POA: Diagnosis not present

## 2019-04-06 DIAGNOSIS — Z8614 Personal history of Methicillin resistant Staphylococcus aureus infection: Secondary | ICD-10-CM | POA: Diagnosis not present

## 2019-04-06 DIAGNOSIS — R0602 Shortness of breath: Secondary | ICD-10-CM

## 2019-04-06 DIAGNOSIS — J9691 Respiratory failure, unspecified with hypoxia: Secondary | ICD-10-CM | POA: Diagnosis not present

## 2019-04-06 DIAGNOSIS — F329 Major depressive disorder, single episode, unspecified: Secondary | ICD-10-CM | POA: Diagnosis present

## 2019-04-06 DIAGNOSIS — J440 Chronic obstructive pulmonary disease with acute lower respiratory infection: Secondary | ICD-10-CM | POA: Diagnosis not present

## 2019-04-06 DIAGNOSIS — T368X5A Adverse effect of other systemic antibiotics, initial encounter: Secondary | ICD-10-CM | POA: Diagnosis present

## 2019-04-06 DIAGNOSIS — J309 Allergic rhinitis, unspecified: Secondary | ICD-10-CM | POA: Diagnosis present

## 2019-04-06 DIAGNOSIS — E8809 Other disorders of plasma-protein metabolism, not elsewhere classified: Secondary | ICD-10-CM | POA: Diagnosis present

## 2019-04-06 DIAGNOSIS — G40909 Epilepsy, unspecified, not intractable, without status epilepticus: Secondary | ICD-10-CM | POA: Diagnosis present

## 2019-04-06 DIAGNOSIS — Z79899 Other long term (current) drug therapy: Secondary | ICD-10-CM

## 2019-04-06 DIAGNOSIS — T502X5A Adverse effect of carbonic-anhydrase inhibitors, benzothiadiazides and other diuretics, initial encounter: Secondary | ICD-10-CM | POA: Diagnosis present

## 2019-04-06 DIAGNOSIS — L409 Psoriasis, unspecified: Secondary | ICD-10-CM | POA: Diagnosis not present

## 2019-04-06 DIAGNOSIS — I509 Heart failure, unspecified: Secondary | ICD-10-CM | POA: Diagnosis not present

## 2019-04-06 DIAGNOSIS — E785 Hyperlipidemia, unspecified: Secondary | ICD-10-CM | POA: Diagnosis present

## 2019-04-06 DIAGNOSIS — Z888 Allergy status to other drugs, medicaments and biological substances status: Secondary | ICD-10-CM

## 2019-04-06 DIAGNOSIS — E1140 Type 2 diabetes mellitus with diabetic neuropathy, unspecified: Secondary | ICD-10-CM | POA: Diagnosis not present

## 2019-04-06 DIAGNOSIS — Z833 Family history of diabetes mellitus: Secondary | ICD-10-CM

## 2019-04-06 DIAGNOSIS — I4891 Unspecified atrial fibrillation: Secondary | ICD-10-CM | POA: Diagnosis not present

## 2019-04-06 DIAGNOSIS — R0902 Hypoxemia: Secondary | ICD-10-CM | POA: Diagnosis not present

## 2019-04-06 DIAGNOSIS — I10 Essential (primary) hypertension: Secondary | ICD-10-CM | POA: Diagnosis not present

## 2019-04-06 DIAGNOSIS — J4540 Moderate persistent asthma, uncomplicated: Secondary | ICD-10-CM | POA: Diagnosis not present

## 2019-04-06 DIAGNOSIS — Z823 Family history of stroke: Secondary | ICD-10-CM

## 2019-04-06 DIAGNOSIS — I110 Hypertensive heart disease with heart failure: Secondary | ICD-10-CM | POA: Diagnosis not present

## 2019-04-06 DIAGNOSIS — I50810 Right heart failure, unspecified: Secondary | ICD-10-CM | POA: Diagnosis not present

## 2019-04-06 LAB — COMPREHENSIVE METABOLIC PANEL
ALT: 14 U/L (ref 0–44)
AST: 43 U/L — ABNORMAL HIGH (ref 15–41)
Albumin: 1.9 g/dL — ABNORMAL LOW (ref 3.5–5.0)
Alkaline Phosphatase: 103 U/L (ref 38–126)
Anion gap: 5 (ref 5–15)
BUN: 46 mg/dL — ABNORMAL HIGH (ref 8–23)
CO2: 27 mmol/L (ref 22–32)
Calcium: 7.2 — AB (ref 8.7–10.7)
Calcium: 7.4 mg/dL — ABNORMAL LOW (ref 8.9–10.3)
Chloride: 99 mmol/L (ref 98–111)
Creatinine, Ser: 3.07 mg/dL — ABNORMAL HIGH (ref 0.44–1.00)
GFR calc Af Amer: 16 mL/min — ABNORMAL LOW (ref >60)
GFR calc non Af Amer: 14 mL/min — ABNORMAL LOW (ref >60)
Glucose, Bld: 133 mg/dL — ABNORMAL HIGH (ref 70–99)
Potassium: 4.9 mmol/L (ref 3.5–5.1)
Sodium: 131 mmol/L — ABNORMAL LOW (ref 135–145)
Total Bilirubin: 1.4 mg/dL — ABNORMAL HIGH (ref 0.3–1.2)
Total Protein: 7 g/dL (ref 6.5–8.1)

## 2019-04-06 LAB — BASIC METABOLIC PANEL
BUN: 41 — AB (ref 4–21)
CO2: 22 (ref 13–22)
Chloride: 99 (ref 99–108)
Creatinine: 2.9 — AB (ref 0.5–1.1)
Glucose: 88
Potassium: 5.3 (ref 3.4–5.3)
Sodium: 132 — AB (ref 137–147)

## 2019-04-06 LAB — URINALYSIS, ROUTINE W REFLEX MICROSCOPIC
Bilirubin Urine: NEGATIVE
Glucose, UA: NEGATIVE mg/dL
Ketones, ur: NEGATIVE mg/dL
Nitrite: NEGATIVE
Protein, ur: 30 mg/dL — AB
Specific Gravity, Urine: 1.014 (ref 1.005–1.030)
pH: 5 (ref 5.0–8.0)

## 2019-04-06 LAB — VANCOMYCIN, RANDOM: Vancomycin Rm: 22

## 2019-04-06 LAB — CBC
HCT: 25.3 % — ABNORMAL LOW (ref 36.0–46.0)
Hemoglobin: 8.2 g/dL — ABNORMAL LOW (ref 12.0–15.0)
MCH: 34.6 pg — ABNORMAL HIGH (ref 26.0–34.0)
MCHC: 32.4 g/dL (ref 30.0–36.0)
MCV: 106.8 fL — ABNORMAL HIGH (ref 80.0–100.0)
Platelets: 121 10*3/uL — ABNORMAL LOW (ref 150–400)
RBC: 2.37 MIL/uL — ABNORMAL LOW (ref 3.87–5.11)
RDW: 16 % — ABNORMAL HIGH (ref 11.5–15.5)
WBC: 7.1 10*3/uL (ref 4.0–10.5)
nRBC: 0 % (ref 0.0–0.2)

## 2019-04-06 LAB — APTT: aPTT: 98 seconds — ABNORMAL HIGH (ref 24–36)

## 2019-04-06 LAB — SODIUM, URINE, RANDOM: Sodium, Ur: 15 mmol/L

## 2019-04-06 LAB — BRAIN NATRIURETIC PEPTIDE: B Natriuretic Peptide: 251 pg/mL — ABNORMAL HIGH (ref 0.0–100.0)

## 2019-04-06 LAB — PROTIME-INR
INR: 2.2 — ABNORMAL HIGH (ref 0.8–1.2)
Prothrombin Time: 24.7 seconds — ABNORMAL HIGH (ref 11.4–15.2)

## 2019-04-06 LAB — CREATININE, URINE, RANDOM: Creatinine, Urine: 131 mg/dL

## 2019-04-06 MED ORDER — HEPARIN (PORCINE) 25000 UT/250ML-% IV SOLN
1350.0000 [IU]/h | INTRAVENOUS | Status: DC
  Administered 2019-04-07: 1200 [IU]/h via INTRAVENOUS
  Administered 2019-04-08 (×2): 1450 [IU]/h via INTRAVENOUS
  Administered 2019-04-09: 1350 [IU]/h via INTRAVENOUS
  Filled 2019-04-06 (×5): qty 250

## 2019-04-06 MED ORDER — ACETAMINOPHEN 650 MG RE SUPP: 650.0000 mg | Freq: Four times a day (QID) | RECTAL | Status: DC | PRN

## 2019-04-06 MED ORDER — METOPROLOL SUCCINATE ER 25 MG PO TB24
25.0000 mg | ORAL_TABLET | Freq: Every day | ORAL | Status: DC
  Administered 2019-04-06 – 2019-04-14 (×7): 25 mg via ORAL
  Filled 2019-04-06 (×9): qty 1

## 2019-04-06 MED ORDER — PHENYTOIN SODIUM EXTENDED 100 MG PO CAPS
400.0000 mg | ORAL_CAPSULE | Freq: Every day | ORAL | Status: DC
  Administered 2019-04-06 – 2019-04-07 (×2): 400 mg via ORAL
  Filled 2019-04-06 (×3): qty 4

## 2019-04-06 MED ORDER — ALBUTEROL SULFATE HFA 108 (90 BASE) MCG/ACT IN AERS
2.0000 | INHALATION_SPRAY | RESPIRATORY_TRACT | Status: DC | PRN
  Filled 2019-04-06 (×2): qty 6.7

## 2019-04-06 MED ORDER — SODIUM CHLORIDE 0.9 % IV BOLUS
500.0000 mL | Freq: Once | INTRAVENOUS | Status: AC
  Administered 2019-04-06: 500 mL via INTRAVENOUS

## 2019-04-06 MED ORDER — ROSUVASTATIN CALCIUM 10 MG PO TABS
20.0000 mg | ORAL_TABLET | Freq: Every day | ORAL | Status: DC
  Administered 2019-04-06 – 2019-04-07 (×2): 20 mg via ORAL
  Filled 2019-04-06: qty 1
  Filled 2019-04-06: qty 2
  Filled 2019-04-06: qty 1

## 2019-04-06 MED ORDER — LEVETIRACETAM 500 MG PO TABS
500.0000 mg | ORAL_TABLET | Freq: Two times a day (BID) | ORAL | Status: DC
  Administered 2019-04-06 – 2019-04-14 (×16): 500 mg via ORAL
  Filled 2019-04-06 (×17): qty 1

## 2019-04-06 MED ORDER — MOMETASONE FURO-FORMOTEROL FUM 100-5 MCG/ACT IN AERO
2.0000 | INHALATION_SPRAY | Freq: Two times a day (BID) | RESPIRATORY_TRACT | Status: DC
  Administered 2019-04-06 – 2019-04-14 (×16): 2 via RESPIRATORY_TRACT
  Filled 2019-04-06: qty 8.8

## 2019-04-06 MED ORDER — HEPARIN BOLUS VIA INFUSION
4000.0000 [IU] | Freq: Once | INTRAVENOUS | Status: DC
  Filled 2019-04-06: qty 4000

## 2019-04-06 MED ORDER — SERTRALINE HCL 50 MG PO TABS
75.0000 mg | ORAL_TABLET | Freq: Every day | ORAL | Status: DC
  Administered 2019-04-06 – 2019-04-12 (×7): 75 mg via ORAL
  Filled 2019-04-06 (×8): qty 2

## 2019-04-06 MED ORDER — ONDANSETRON HCL 4 MG/2ML IJ SOLN: 4.0000 mg | Freq: Four times a day (QID) | INTRAMUSCULAR | Status: DC | PRN

## 2019-04-06 MED ORDER — ONDANSETRON HCL 4 MG PO TABS: 4.0000 mg | ORAL_TABLET | Freq: Four times a day (QID) | ORAL | Status: DC | PRN

## 2019-04-06 MED ORDER — ACETAMINOPHEN 325 MG PO TABS: 650.0000 mg | ORAL_TABLET | Freq: Four times a day (QID) | ORAL | Status: DC | PRN

## 2019-04-06 MED ORDER — SODIUM CHLORIDE 0.9% FLUSH
3.0000 mL | Freq: Two times a day (BID) | INTRAVENOUS | Status: DC
  Administered 2019-04-07 – 2019-04-14 (×11): 3 mL via INTRAVENOUS

## 2019-04-06 NOTE — ED Notes (Signed)
Attempted I&O cath with Terri Piedra RN. No urine collected as pt dry. Will attempt again post NS bolus. Stage 1 to 2 with small open bleeding abrasions on sacrum.

## 2019-04-06 NOTE — Progress Notes (Signed)
ANTICOAGULATION CONSULT NOTE - Initial Consult  Pharmacy Consult for Heparin  Indication: atrial fibrillation  Allergies  Allergen Reactions  . Doxycycline Diarrhea and Nausea Only  . Methotrexate Rash    Patient Measurements: Height: 5\' 5"  (165.1 cm) Weight: 79.4 kg (175 lb) IBW/kg (Calculated) : 57 Heparin Dosing Weight: 73.7 kg   Vital Signs: Temp: 98 F (36.7 C) (04/01 1512) Temp Source: Oral (04/01 1512) BP: 121/64 (04/01 1800) Pulse Rate: 92 (04/01 1800)  Labs: Recent Labs    04/06/19 1533  HGB 8.2*  HCT 25.3*  PLT 121*  CREATININE 3.07*    Estimated Creatinine Clearance: 14.7 mL/min (A) (by C-G formula based on SCr of 3.07 mg/dL (H)).   Medical History: Past Medical History:  Diagnosis Date  . Acute bronchitis   . Allergic rhinitis   . Arthritis   . CAD (coronary artery disease)    s/p stent LAD 8/03 and stent placement OM1  . Cancer ([place])    skin  . Chronic back pain   . COPD with asthma ([place])   . CVA (cerebral vascular accident) ([place])   . Diabetes mellitus ([place])   . Hypercholesterolemia   . Hypertension   . Seizure disorder ([place])     Medications:  (Not in a hospital admission)   Assessment: Pharmacy consulted to dose heparin for Afib in this 85 year old female admitted with ARF.  Pt was on Pradaxa 150 mg PO BID at home, last dose was on 4/1 @ 1100.  CrC = 14.7 ml/min   Goal of Therapy:  Heparin level 0.3-0.7 units/ml Monitor platelets by anticoagulation protocol: Yes   Plan:  Heparin 4000 units IV X 1 ordered for 4/2 @ 1100 followed by heparin infusion to start @ 1200 units/hr on 4/2 @ 1100 (24 hrs after last dose of Pradaxa per protocol). Will check HL 8 hrs after start of drip.   Ulysse Siemen D 04/06/2019,7:46 PM

## 2019-04-06 NOTE — Telephone Encounter (Signed)
Pt daughter called to let you know that her mother is on the way to the hospital

## 2019-04-06 NOTE — ED Triage Notes (Signed)
Pt in via [facility] from home d/t concerns of primary doc about pt's kidney function. History of MRSA resp and pt's antibiotics were stopped two days ago d/t declining kidney function per EMS. VSS/WDL per EMS. Pt currently on 2L home oxygen. A&Ox4 per EMS. Bruising to pt's face.

## 2019-04-06 NOTE — Telephone Encounter (Signed)
Daughter called in to let me know that therapy came out today and pts O2 was dropping to 74% when standing and only came up to mid 80s when resting with O2 on. Patients daughter stated that pt seemed SOB. I recommended that pt return to the hospital via EMS. Pts daughter agreed. Also discussed ID f/u. There has been confusion with who she is supposed to be seeing. Appt was cancelled with Dr Delaine Lame and reached out to Dr Tyler Pita office. Daughter stated that Dr Ola Spurr only saw pt because Dr Delaine Lame was off. Daughter is going to call EMS and will work out ID appt once we determine if pt will be readmitted.

## 2019-04-06 NOTE — ED Notes (Signed)
Pt denies feeling like she could urinate yet.

## 2019-04-06 NOTE — ED Notes (Signed)
Rainbow of tubes pulled from L arm PICC line. Site C/D/I.

## 2019-04-06 NOTE — ED Notes (Signed)
Pt reports she thinks she can urinate now. Will complete I&O cath soon.

## 2019-04-06 NOTE — ED Notes (Signed)
Pt given drink by float RN.

## 2019-04-06 NOTE — ED Notes (Signed)
Pt alert and resting calmly in bed. Currently denies pain. Bed locked low. Rails up. Denies any needs.

## 2019-04-06 NOTE — ED Provider Notes (Signed)
[facility] Emergency Department Provider Note ___________________________________________   First MD Initiated Contact with Patient 04/06/19 1444     (approximate)  I have reviewed the triage vital signs and the nursing notes.   HISTORY  Chief Complaint Abnormal kidney tests  EM caveat: Patient is a very poor historian, patient's daughter however is able to give a specific history   HPI Brooke Baker is a 85 y.o. female here for evaluation due to concerns of abnormal labs then also low oxygen with physical therapy today  Per the patient's daughter they were notified by the primary that she needed to come to the hospital they are concerned about worsening renal function in the setting of use of vancomycin.  Additionally she has had increased fatigue, difficulty caring for her at home as daughter works and patient's husband is elderly, patient is primarily been in bed.  Very fatigued sleepy all day.  Patient denies any acute symptoms at this time except she feels tired and fatigued.  Worn down.  She does tell me she had labs and they were concerned about her kidneys possibly   Past Medical History:  Diagnosis Date  . Acute bronchitis   . Allergic rhinitis   . Arthritis   . CAD (coronary artery disease)    s/p stent LAD 8/03 and stent placement OM1  . Cancer ([place])    skin  . Chronic back pain   . COPD with asthma ([place])   . CVA (cerebral vascular accident) ([place])   . Diabetes mellitus ([place])   . Hypercholesterolemia   . Hypertension   . Seizure disorder [facility])     Patient Active Problem List   Diagnosis Date Noted  . Acute renal failure (ARF) ([place]) 04/06/2019  . MRSA bacteremia 04/06/2019  . Community acquired pneumonia   . Sepsis ([place]) 03/19/2019  . Chronic respiratory failure ([place]) 02/05/2019  . Macrocytic anemia 01/14/2019  . Skin lesion 11/13/2018  . Leg lesion 11/13/2018  . Cough 09/25/2018  . Fall 09/25/2018  . Thrombocytopenia ([place])  09/11/2018  . ILD (interstitial lung disease) ([place]) 01/14/2018  . Chronic diastolic CHF (congestive heart failure) ([place]) 07/01/2017  . Therapeutic drug monitoring 03/25/2017  . BMI 39.0-39.9,adult 03/07/2017  . Falls frequently 03/18/2016  . Psoriatic arthritis ([place]) 05/14/2015  . Health care maintenance 10/31/2014  . Stress 12/03/2013  . Obesity 08/01/2013  . Neuropathy 08/01/2013  . Macrocytosis 08/01/2013  . Diabetes mellitus ([place]) 12/18/2011  . Hypertension associated with diabetes ([place]) 12/18/2011  . Chronic back pain 12/18/2011  . Seizure disorder ([place]) 12/18/2011  . AF (paroxysmal atrial fibrillation) ([place]) 12/18/2011  . Hyperlipidemia associated with type 2 diabetes mellitus ([place]) 12/18/2011  . Asthma, moderate persistent 04/08/2009  . Coronary atherosclerosis 04/19/2007  . History of CVA (cerebrovascular accident) 04/19/2007  . ASTHMATIC BRONCHITIS, ACUTE 04/19/2007  . Seasonal and perennial allergic rhinitis 04/19/2007    Past Surgical History:  Procedure Laterality Date  . BREAST BIOPSY Bilateral    axillas  . CORONARY STENT PLACEMENT  8/03   LAD  . CORONARY STENT PLACEMENT     OM1  2006  . FEMUR FRACTURE SURGERY Right [id number]   Dr. Marry Guan  . LUMBAR SPINE SURGERY     x3  . TEE WITHOUT CARDIOVERSION N/A 03/21/2019   Procedure: TRANSESOPHAGEAL ECHOCARDIOGRAM (TEE);  Surgeon: Teodoro Spray, MD;  Location: [facility] ORS;  Service: Cardiovascular;  Laterality: N/A;  . TOTAL ABDOMINAL HYSTERECTOMY     appendectomy - age 62  . TOTAL  HIP ARTHROPLASTY    . TOTAL KNEE ARTHROPLASTY     2005    Prior to Admission medications   Medication Sig Start Date End Date Taking? Authorizing Provider  albuterol (PROVENTIL) (2.5 MG/3ML) 0.083% nebulizer solution USE ONE VIAL VIA NEBULZIER EVERY FOUR HOURS AS NEEDED FOR WHEEZING 12/08/18  Yes Einar Pheasant, MD  albuterol (VENTOLIN HFA) 108 (90 Base) MCG/ACT inhaler USE 2 PUFFS EVERY 4 HOURS AS DIRECTED - RESCUE Patient taking  differently: Inhale 2 puffs into the lungs every 4 (four) hours as needed for wheezing or shortness of breath.  12/08/18  Yes Einar Pheasant, MD  budesonide-formoterol (SYMBICORT) 80-4.5 MCG/ACT inhaler INHALE 2 PUFFS TWICE A DAY Patient taking differently: Inhale 2 puffs into the lungs 2 (two) times daily.  06/08/18  Yes Young, Tarri Fuller D, MD  dabigatran (PRADAXA) 150 MG CAPS Take 150 mg by mouth every 12 (twelve) hours.   Yes [provider]  gabapentin (NEURONTIN) 100 MG capsule TAKE ONE CAPSULE BY MOUTH TWICE A DAY Patient taking differently: Take 200 mg by mouth at bedtime.  03/10/19  Yes Einar Pheasant, MD  levETIRAcetam (KEPPRA) 750 MG tablet Take 1 tablet (750 mg total) by mouth 2 (two) times daily. 03/03/19  Yes Melvenia Beam, MD  metoprolol succinate (TOPROL-XL) 25 MG 24 hr tablet Take 1 tablet (25 mg total) by mouth daily. 03/30/19  Yes Lavina Hamman, MD  Omega-3 Fatty Acids (FISH OIL) 1000 MG CAPS Take 1,000 mg by mouth daily.    Yes [provider]  phenytoin (DILANTIN) 100 MG ER capsule Take 4 capsules (400 mg total) by mouth at bedtime. 03/03/19  Yes Melvenia Beam, MD  polyethylene glycol (MIRALAX / GLYCOLAX) 17 g packet Take 17 g by mouth daily as needed for mild constipation or moderate constipation.    Yes [provider]  potassium chloride (KLOR-CON) 10 MEQ tablet Take 1 tablet (10 mEq total) by mouth daily. 12/08/18  Yes Einar Pheasant, MD  rosuvastatin (CRESTOR) 20 MG tablet TAKE 1 TABLET BY MOUTH DAILY Patient taking differently: Take 20 mg by mouth daily.  03/10/19  Yes Einar Pheasant, MD  sertraline (ZOLOFT) 50 MG tablet Take 75 mg by mouth at bedtime.    Yes [provider]  vancomycin IVPB Inject 750 mg into the vein daily for 21 days. 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 Labs - Thursday:  BMP, CRP and vancomycin trough 03/29/19  04/19/19 Yes Lavina Hamman, MD    Allergies Doxycycline and Methotrexate  Family History  Problem Relation Age of Onset  . Allergies Brother   . Cancer Brother   . Allergies Sister   . Cancer Sister        Non-hodgkins lymphoma  . Asthma Sister   . Rheum arthritis Sister   . Stroke Mother   . Asthma Brother   . Rheum arthritis Brother   . Cancer Sister        non-hodgkins lymphoma  . Diabetes Brother     Social History Social History   Tobacco Use  . Smoking status: Never Smoker  . Smokeless tobacco: Never Used  Substance Use Topics  . Alcohol use: No    Alcohol/week: 0.0 standard drinks  . Drug use: No    Review of Systems   Patient denies pain.  Reports he is comfortable resting.  However she reports that she has been very tired difficulty  getting up cannot get up on her own and requires the assistance of her daughter and husband  ____________________________________________   PHYSICAL EXAM:  VITAL SIGNS: ED Triage Vitals [04/06/19 1441]  Enc Vitals Group     BP (!) 110/55     Pulse Rate 100     Resp      Temp      Temp src      SpO2 96 %     Weight      Height      Head Circumference      Peak Flow      Pain Score      Pain Loc      Pain Edu?      Excl. in [place]?     Constitutional: Alert and oriented.  Fatigued, chronically ill in appearance but in no acute distress Eyes: Conjunctivae are normal. Head: Atraumatic.  What appears to be an old bruise over her left forehead. Nose: No congestion/rhinnorhea. Mouth/Throat: Mucous membranes are moist. Neck: No stridor.  Cardiovascular: Normal rate, regular rhythm. Grossly normal heart sounds.  Good peripheral circulation. Respiratory: Normal respiratory effort.  No retractions. Lungs CTAB.  2 L oxygen, saturation 99% on room air. Gastrointestinal: Soft and nontender. No distention. Musculoskeletal: No lower extremity tenderness nor edema. Neurologic:  Normal speech and language. No gross focal  neurologic deficits are appreciated.  Skin:  Skin is warm, dry and intact. No rash noted. Psychiatric: Mood and affect are normal. Speech and behavior are normal.  ____________________________________________   LABS (all labs ordered are listed, but only abnormal results are displayed)  Labs Reviewed  CBC - Abnormal; Notable for the following components:      Result Value   RBC 2.37 (*)    Hemoglobin 8.2 (*)    HCT 25.3 (*)    MCV 106.8 (*)    MCH 34.6 (*)    RDW 16.0 (*)    Platelets 121 (*)    All other components within normal limits  COMPREHENSIVE METABOLIC PANEL - Abnormal; Notable for the following components:   Sodium 131 (*)    Glucose, Bld 133 (*)    BUN 46 (*)    Creatinine, Ser 3.07 (*)    Calcium 7.4 (*)    Albumin 1.9 (*)    AST 43 (*)    Total Bilirubin 1.4 (*)    GFR calc non Af Amer 14 (*)    GFR calc Af Amer 16 (*)    All other components within normal limits  BRAIN NATRIURETIC PEPTIDE - Abnormal; Notable for the following components:   B Natriuretic Peptide 251.0 (*)    All other components within normal limits  APTT - Abnormal; Notable for the following components:   aPTT 98 (*)    All other components within normal limits  PROTIME-INR - Abnormal; Notable for the following components:   Prothrombin Time 24.7 (*)    INR 2.2 (*)    All other components within normal limits  VANCOMYCIN, RANDOM  URINALYSIS, ROUTINE W REFLEX MICROSCOPIC  SODIUM, URINE, RANDOM  CREATININE, URINE, RANDOM  UREA NITROGEN, URINE  MAGNESIUM  PHOSPHORUS  CBC  BASIC METABOLIC PANEL  VANCOMYCIN, RANDOM   ____________________________________________  EKG  Reviewed inter by me at 1532 Heart rate 79 QRS 100 QTc 460 A. fib, rate controlled.  No evidence of acute ischemia with possible old anteroseptal infarct ____________________________________________  RADIOLOGY  DG Chest 2 View  Result Date: 04/06/2019 CLINICAL DATA:  Worsening renal function. EXAM:  CHEST - 2 VIEW  COMPARISON:  Chest x-ray 03/26/2019, 04/20/2017.  CT 03/26/2019. FINDINGS: Left PICC line noted with tip over superior vena cava. Cardiomegaly with diffuse bilateral interstitial prominence, improved from prior studies. Findings suggest improving interstitial edema. Continued follow-up exam suggested. IMPRESSION: Left PICC line noted with tip over superior vena cava. 2. Cardiomegaly with diffuse bilateral tissue prominence, improved from prior studies. Findings suggest improving interstitial edema. Electronically Signed   By: Marcello Moores  Register   On: 04/06/2019 15:22    Chest x-ray reviewed, improving airspace disease ____________________________________________   PROCEDURES  Procedure(s) performed: None  Procedures  Critical Care performed: No  ____________________________________________   INITIAL IMPRESSION / ASSESSMENT AND PLAN / ED COURSE  Pertinent labs & imaging results that were available during my care of the patient were reviewed by me and considered in my medical decision making (see chart for details).     Clinical Course as of Apr 06 2118  Thu Apr 06, 2019  1452 Very fatigued. Per daughter patient too weak to stand on her own. Had bad experience at [facility] and took her home. PCP (Dr. Nicki Reaper) felt patient needs to be readmitted to hospital per daughter.    [MQ]  [id number] PICC line left arm clean dry intact   [MQ]    Clinical Course User Index [MQ] Delman Kitten, MD    Labs affirm acute kidney injury.  Imaging studies of the chest appeared reassuring.  Discussed with Dr. Posey Pronto, will admit for further care and management. ____________________________________________   FINAL CLINICAL IMPRESSION(S) / ED DIAGNOSES  Final diagnoses:  AKI (acute kidney injury) [facility])        Note:  This document was prepared using Dragon voice recognition software and may include unintentional dictation errors       Delman Kitten, MD 04/06/19 2121

## 2019-04-06 NOTE — H&P (Signed)
History and Physical    Brooke Baker [id number] DOB: Mar 01, 1934 DOA: 04/06/2019  PCP: Einar Pheasant, MD  Patient coming from: Home  I have personally briefly reviewed patient's old medical records in [place]  Chief Complaint: Abnormal labs  HPI: Brooke Baker is a 85 y.o. female with medical history significant for COPD/ILD with chronic hypoxic respiratory failure on 2 L supplemental O2 via [place], CAD s/p PCI, paroxysmal atrial fibrillation on Pradaxa, chronic diastolic CHF (EF XX123456), history of CVA, type 2 diabetes, hypertension, hyperlipidemia, macrocytic anemia, thrombocytopenia, seizure disorder, and depression who presents to the ED for evaluation of abnormal labs.  Patient was recently hospitalized from 03/18/2019-03/29/2019 for sepsis secondary to MRSA bacteremia/pneumonia.  She was started on IV vancomycin with plan to complete a 4-week course via left upper extremity PICC line.  Daughter states that her outpatient medical team had advised her to hold vancomycin yesterday due to concern of renal function.  Patient reports having chronic shortness of breath which is somewhat worsened recently.  She reports chronic swelling in her lower extremities which is unchanged.  She reports an episode of nausea with vomiting about 1 week ago but denies any further episodes.  She reports making good urine output however daughter feels that this is decreased in what is expected.  He has been taking Lasix 40 mg daily.  Patient denies any diarrhea, cough, chest pain.  She reports using ibuprofen as needed for pain up to 3 times a day with last use about 3 or 4 days ago.  She denies any obvious bleeding.  Daughter has also noted that patient has been more fatigued and sleepy recently.  She has noticed some confusion as well as occasional jerking movements in her upper extremities.  ED Course:  In the ED, initial vitals showed BP 110/55, pulse 100, temp 98.0 Fahrenheit, SPO2 96% on 2 L  supplemental O2 via [place].  Labs showed WBC 7.1, hemoglobin 8.2, platelets 121,000, sodium 131, potassium 4.9, bicarb 27, BUN 46, creatinine 3.07, random vancomycin level 22.    2 view chest x-ray shows left PICC line in place with tip over SVC and cardiomegaly with diffuse bilateral tissue prominence, improved from prior studies.  The hospitalist service was consulted admit for further evaluation and management.  Review of Systems: All systems reviewed and are negative except as documented in history of present illness above.   Past Medical History:  Diagnosis Date  . Acute bronchitis   . Allergic rhinitis   . Arthritis   . CAD (coronary artery disease)    s/p stent LAD 8/03 and stent placement OM1  . Cancer ([place])    skin  . Chronic back pain   . COPD with asthma ([place])   . CVA (cerebral vascular accident) ([place])   . Diabetes mellitus ([place])   . Hypercholesterolemia   . Hypertension   . Seizure disorder [facility])     Past Surgical History:  Procedure Laterality Date  . BREAST BIOPSY Bilateral    [place]  . CORONARY STENT PLACEMENT  8/03   LAD  . CORONARY STENT PLACEMENT     OM1  2006  . FEMUR FRACTURE SURGERY Right [id number]   Dr. Marry Guan  . LUMBAR SPINE SURGERY     x3  . TEE WITHOUT CARDIOVERSION N/A 03/21/2019   Procedure: TRANSESOPHAGEAL ECHOCARDIOGRAM (TEE);  Surgeon: Teodoro Spray, MD;  Location: [facility] ORS;  Service: Cardiovascular;  Laterality: N/A;  . TOTAL ABDOMINAL HYSTERECTOMY     appendectomy -  age 64  . TOTAL HIP ARTHROPLASTY    . TOTAL KNEE ARTHROPLASTY     2005    Social History:  reports that she has never smoked. She has never used smokeless tobacco. She reports that she does not drink alcohol or use drugs.  Allergies  Allergen Reactions  . Doxycycline Diarrhea and Nausea Only  . Methotrexate Rash    Family History  Problem Relation Age of Onset  . Allergies Brother   . Cancer Brother   . Allergies Sister   . Cancer Sister        Non-hodgkins  lymphoma  . Asthma Sister   . Rheum arthritis Sister   . Stroke Mother   . Asthma Brother   . Rheum arthritis Brother   . Cancer Sister        non-hodgkins lymphoma  . Diabetes Brother      Prior to Admission medications   Medication Sig Start Date End Date Taking? Authorizing Provider  albuterol (PROVENTIL) (2.5 MG/3ML) 0.083% nebulizer solution USE ONE VIAL VIA NEBULZIER EVERY FOUR HOURS AS NEEDED FOR WHEEZING 12/08/18   Einar Pheasant, MD  albuterol (VENTOLIN HFA) 108 (90 Base) MCG/ACT inhaler USE 2 PUFFS EVERY 4 HOURS AS DIRECTED - RESCUE 12/08/18   Einar Pheasant, MD  aspirin 81 MG tablet Take 81 mg by mouth daily.      [provider]  budesonide-formoterol (SYMBICORT) 80-4.5 MCG/ACT inhaler INHALE 2 PUFFS TWICE A DAY Patient taking differently: Inhale 2 puffs into the lungs 2 (two) times daily.  06/08/18   Deneise Lever, MD  dabigatran (PRADAXA) 150 MG CAPS Take 150 mg by mouth every 12 (twelve) hours.    [provider]  fluticasone (FLONASE) 50 MCG/ACT nasal spray USE TWO SPRAYS IN EACH NOSTRIL EACH DAY AS DIRECTED BY PHYSICIAN 03/02/17   Einar Pheasant, MD  furosemide (LASIX) 40 MG tablet Take 40 mg by mouth daily.    [provider]  gabapentin (NEURONTIN) 100 MG capsule TAKE ONE CAPSULE BY MOUTH TWICE A DAY 03/10/19   Einar Pheasant, MD  levETIRAcetam (KEPPRA) 750 MG tablet Take 1 tablet (750 mg total) by mouth 2 (two) times daily. 03/03/19   Melvenia Beam, MD  metoprolol succinate (TOPROL-XL) 25 MG 24 hr tablet Take 1 tablet (25 mg total) by mouth daily. 03/30/19   Lavina Hamman, MD  mupirocin ointment Drue Stager) 2 % Apply to affected area on leg twice a day 11/08/18   Einar Pheasant, MD  nystatin-triamcinolone [facility] II) cream APPLY 1 APPLICATION TOPICALLY 2 TIMES DAILY TO BOTTOM 11/23/18   Einar Pheasant, MD  Omega-3 Fatty Acids (FISH OIL) 1000 MG CAPS Take by mouth.    [provider]  phenytoin (DILANTIN) 100 MG ER capsule Take 4  capsules (400 mg total) by mouth at bedtime. 03/03/19   Melvenia Beam, MD  polyethylene glycol (MIRALAX / GLYCOLAX) 17 g packet Take 17 g by mouth daily.    [provider]  potassium chloride (KLOR-CON) 10 MEQ tablet Take 1 tablet (10 mEq total) by mouth daily. 12/08/18   Einar Pheasant, MD  rosuvastatin (CRESTOR) 20 MG tablet TAKE 1 TABLET BY MOUTH DAILY 03/10/19   Einar Pheasant, MD  sertraline (ZOLOFT) 50 MG tablet TAKE 1 AND 1/2 TABLETS BY MOUTH ONCE A DAY. 01/09/19   Einar Pheasant, MD  vancomycin IVPB Inject 750 mg into the vein daily for 21 days. Indication: MRSA bacteremia with joint hardware (TEE neg) Last Day of Therapy: 04/15/2019 Labs -  Sunday/Monday:  CBC/D, CMP, and vancomycin trough. Labs - Thursday:  BMP, CRP and vancomycin trough 03/29/19 04/19/19  Lavina Hamman, MD    Physical Exam: Vitals:   04/06/19 1441 04/06/19 1512 04/06/19 1514 04/06/19 1800  BP: (!) 110/55   121/64  Pulse: 100   92  Temp:  98 F (36.7 C)    TempSrc:  Oral    SpO2: 96%   98%  Weight:   79.4 kg   Height:   5\' 5"  (1.651 m)    Constitutional: Elderly woman resting supine in bed, NAD, calm, comfortable Eyes: PERRL, lids and conjunctivae normal ENMT: Mucous membranes are dry. Posterior pharynx clear of any exudate or lesions.Normal dentition.  Neck: normal, supple, no masses. Respiratory: Distant breath sounds with end expiratory wheezing. Normal respiratory effort. No accessory muscle use.  Cardiovascular: Irregularly irregular, no murmurs / rubs / gallops.  Anasarca.  LUE PICC line in place.   Abdomen: no tenderness, no masses palpated. No hepatosplenomegaly. Bowel sounds positive.  Musculoskeletal: no clubbing / cyanosis. No joint deformity upper and lower extremities. Good ROM, no contractures. Normal muscle tone.  Skin: Stasis dermatitis changes of lower extremities, healed bruising left forehead Neurologic: CN 2-12 grossly intact. Sensation intact, Strength 5/5 in all 4.   Psychiatric: Alert and oriented x 3. Normal mood.    Labs on Admission: I have personally reviewed following labs and imaging studies  CBC: Recent Labs  Lab 04/06/19 1533  WBC 7.1  HGB 8.2*  HCT 25.3*  MCV 106.8*  PLT 123XX123*   Basic Metabolic Panel: Recent Labs  Lab 04/06/19 1533  NA 131*  K 4.9  CL 99  CO2 27  GLUCOSE 133*  BUN 46*  CREATININE 3.07*  CALCIUM 7.4*   GFR: Estimated Creatinine Clearance: 14.7 mL/min (A) (by C-G formula based on SCr of 3.07 mg/dL (H)). Liver Function Tests: Recent Labs  Lab 04/06/19 1533  AST 43*  ALT 14  ALKPHOS 103  BILITOT 1.4*  PROT 7.0  ALBUMIN 1.9*   No results for input(s): LIPASE, AMYLASE in the last 168 hours. No results for input(s): AMMONIA in the last 168 hours. Coagulation Profile: No results for input(s): INR, PROTIME in the last 168 hours. Cardiac Enzymes: No results for input(s): CKTOTAL, CKMB, CKMBINDEX, TROPONINI in the last 168 hours. BNP (last 3 results) No results for input(s): PROBNP in the last 8760 hours. HbA1C: No results for input(s): HGBA1C in the last 72 hours. CBG: No results for input(s): GLUCAP in the last 168 hours. Lipid Profile: No results for input(s): CHOL, HDL, LDLCALC, TRIG, CHOLHDL, LDLDIRECT in the last 72 hours. Thyroid Function Tests: No results for input(s): TSH, T4TOTAL, FREET4, T3FREE, THYROIDAB in the last 72 hours. Anemia Panel: No results for input(s): VITAMINB12, FOLATE, FERRITIN, TIBC, IRON, RETICCTPCT in the last 72 hours. Urine analysis:    Component Value Date/Time   COLORURINE YELLOW (A) 09/08/2018 2144   APPEARANCEUR HAZY (A) 09/08/2018 2144   APPEARANCEUR Clear 05/09/2012 1630   LABSPEC 1.010 09/08/2018 2144   LABSPEC 1.015 05/09/2012 1630   PHURINE 6.0 09/08/2018 2144   GLUCOSEU NEGATIVE 09/08/2018 2144   GLUCOSEU Negative 05/09/2012 1630   HGBUR SMALL (A) 09/08/2018 2144   BILIRUBINUR NEGATIVE 09/08/2018 2144   BILIRUBINUR Negative 05/09/2012 [place] 09/08/2018 2144   PROTEINUR NEGATIVE 09/08/2018 2144   NITRITE NEGATIVE 09/08/2018 2144   LEUKOCYTESUR NEGATIVE 09/08/2018 2144   LEUKOCYTESUR Negative 05/09/2012 1630    Radiological Exams on Admission: DG  Chest 2 View  Result Date: 04/06/2019 CLINICAL DATA:  Worsening renal function. EXAM: CHEST - 2 VIEW COMPARISON:  Chest x-ray 03/26/2019, 04/20/2017.  CT 03/26/2019. FINDINGS: Left PICC line noted with tip over superior vena cava. Cardiomegaly with diffuse bilateral interstitial prominence, improved from prior studies. Findings suggest improving interstitial edema. Continued follow-up exam suggested. IMPRESSION: Left PICC line noted with tip over superior vena cava. 2. Cardiomegaly with diffuse bilateral tissue prominence, improved from prior studies. Findings suggest improving interstitial edema. Electronically Signed   By: Marcello Moores  Register   On: 04/06/2019 15:22    EKG: Independently reviewed. Atrial fibrillation, LAD, low voltage.  Rate slower when compared to prior.  Assessment/Plan Principal Problem:   Acute renal failure (ARF) (HCC) Active Problems:   History of CVA (cerebrovascular accident)   Diabetes mellitus ([place])   Hypertension associated with diabetes ([place])   Seizure disorder (HCC)   AF (paroxysmal atrial fibrillation) ([place])   Hyperlipidemia associated with type 2 diabetes mellitus (HCC)   Chronic diastolic CHF (congestive heart failure) (HCC)   ILD (interstitial lung disease) (HCC)   Thrombocytopenia (HCC)   Macrocytic anemia   Chronic respiratory failure (HCC)   MRSA bacteremia  Brooke Baker is a 85 y.o. female with medical history significant for COPD/ILD with chronic hypoxic respiratory failure on 2 L supplemental O2 via [place], CAD s/p PCI, paroxysmal atrial fibrillation on Pradaxa, chronic diastolic CHF (EF [id number]), history of CVA, type 2 diabetes, hypertension, hyperlipidemia, macrocytic anemia, thrombocytopenia, seizure disorder, and depression  who is admitted with acute renal failure.   Acute renal failure: Creatinine 3.07 compared to 0.83 on 03/29/2019.  Suspect vancomycin associated kidney injury at this time.  Currently no emergent indication for dialysis. -Check urinalysis, urine sodium and urea -Obtain renal ultrasound -Monitor strict I/O's, bladder scan and in and out cath as needed -Hold IV vancomycin, avoid NSAIDs, renal dose medications -Given 500 cc normal saline in the ED, will hold further fluids for now -Nephrology consulted and will see in a.m., appreciate further assistance  MRSA bacteremia: Recent admission for MRSA bacteremia on blood cultures 03/19/2019.  Repeat cultures 03/20/2019 are negative.  Patient has been on IV vancomycin with anticipated end date 04/19/2019. -Random vancomycin level is 22 -Hold IV vancomycin -discussed with infectious disease who will see tomorrow, further assistance appreciated  Chronic diastolic CHF: 0000000 by TEE on 03/21/2019, no vegetation/infective endocarditis noted.  She has been on oral Lasix 40 mg daily.  Has chronic edema both lower extremities but otherwise appears compensated at this time. -Hold Lasix for now -Monitor strict I/O's and daily weights  COPD/ILD/chronic respiratory failure: Uses 2-4 L supplemental O2 at all times at home.  Chest x-ray with interval improvement compared to prior. -Continue supplemental oxygen -Continue Dulera and as needed albuterol  Paroxysmal atrial fibrillation: Remains in atrial fibrillation with controlled rate. -Continue Toprol-XL -Holding Pradaxa with acute renal failure, start heparin for now  CAD s/p PCI: Chronic and stable.  Denies any chest pain. -Continue Toprol-XL, rosuvastatin, heparin  Diet controlled type 2 diabetes: Last A1c 4.8% on 01/19/2019.  Continue to monitor.  History of CVA: Continue rosuvastatin.  Macrocytic anemia/thrombocytopenia: Hemoglobin and platelet count stable without obvious bleeding.  Follows with  hematology, question of MDS.  Continue to monitor.  Hypertension: Currently stable, continue Toprol-XL.  Hyperlipidemia: Continue rosuvastatin.  Seizure disorder: Continue home phenytoin, reduced home Keppra dosing given acute renal failure.  Depression: Continue sertraline.  DVT prophylaxis: Heparin anticoagulation Code Status: Full code, confirmed with patient Family Communication:  Discussed with patient's daughter Brooke Baker by phone [phone] Disposition Plan: From home, discharge pending progression of renal function and nephrology, infectious disease recommendations Consults called: Infectious disease and nephrology consultations requested Admission status: Inpatient, patient likely requires greater than 2 midnight length stay for management of acute renal failure in setting of suspected vancomycin nephrotoxicity and recent MRSA bacteremia hospitalization.   Zada Finders MD [facility]  If 7PM-7AM, please contact night-coverage [email or web address]  04/06/2019, 7:34 PM

## 2019-04-06 NOTE — Telephone Encounter (Signed)
NOTED

## 2019-04-06 NOTE — ED Notes (Signed)
Pt offered snacks; pt declined.

## 2019-04-07 ENCOUNTER — Inpatient Hospital Stay: Payer: Medicare PPO

## 2019-04-07 DIAGNOSIS — D696 Thrombocytopenia, unspecified: Secondary | ICD-10-CM

## 2019-04-07 DIAGNOSIS — I509 Heart failure, unspecified: Secondary | ICD-10-CM

## 2019-04-07 DIAGNOSIS — Z881 Allergy status to other antibiotic agents status: Secondary | ICD-10-CM

## 2019-04-07 DIAGNOSIS — Z96642 Presence of left artificial hip joint: Secondary | ICD-10-CM

## 2019-04-07 DIAGNOSIS — Z981 Arthrodesis status: Secondary | ICD-10-CM

## 2019-04-07 DIAGNOSIS — Z96651 Presence of right artificial knee joint: Secondary | ICD-10-CM

## 2019-04-07 DIAGNOSIS — I2510 Atherosclerotic heart disease of native coronary artery without angina pectoris: Secondary | ICD-10-CM

## 2019-04-07 DIAGNOSIS — N179 Acute kidney failure, unspecified: Secondary | ICD-10-CM

## 2019-04-07 DIAGNOSIS — N170 Acute kidney failure with tubular necrosis: Principal | ICD-10-CM

## 2019-04-07 DIAGNOSIS — J45909 Unspecified asthma, uncomplicated: Secondary | ICD-10-CM

## 2019-04-07 DIAGNOSIS — I4891 Unspecified atrial fibrillation: Secondary | ICD-10-CM

## 2019-04-07 DIAGNOSIS — D539 Nutritional anemia, unspecified: Secondary | ICD-10-CM

## 2019-04-07 DIAGNOSIS — J849 Interstitial pulmonary disease, unspecified: Secondary | ICD-10-CM

## 2019-04-07 DIAGNOSIS — L409 Psoriasis, unspecified: Secondary | ICD-10-CM

## 2019-04-07 DIAGNOSIS — Z888 Allergy status to other drugs, medicaments and biological substances status: Secondary | ICD-10-CM

## 2019-04-07 DIAGNOSIS — E119 Type 2 diabetes mellitus without complications: Secondary | ICD-10-CM

## 2019-04-07 DIAGNOSIS — R0902 Hypoxemia: Secondary | ICD-10-CM

## 2019-04-07 DIAGNOSIS — Z95828 Presence of other vascular implants and grafts: Secondary | ICD-10-CM

## 2019-04-07 DIAGNOSIS — Z8614 Personal history of Methicillin resistant Staphylococcus aureus infection: Secondary | ICD-10-CM

## 2019-04-07 DIAGNOSIS — D649 Anemia, unspecified: Secondary | ICD-10-CM

## 2019-04-07 DIAGNOSIS — Z8673 Personal history of transient ischemic attack (TIA), and cerebral infarction without residual deficits: Secondary | ICD-10-CM

## 2019-04-07 LAB — IRON AND TIBC
Iron: 76 ug/dL (ref 28–170)
Saturation Ratios: 61 % — ABNORMAL HIGH (ref 10.4–31.8)
TIBC: 125 ug/dL — ABNORMAL LOW (ref 250–450)
UIBC: 49 ug/dL

## 2019-04-07 LAB — BASIC METABOLIC PANEL
Anion gap: 6 (ref 5–15)
BUN: 47 mg/dL — ABNORMAL HIGH (ref 8–23)
CO2: 27 mmol/L (ref 22–32)
Calcium: 7.4 mg/dL — ABNORMAL LOW (ref 8.9–10.3)
Chloride: 102 mmol/L (ref 98–111)
Creatinine, Ser: 2.79 mg/dL — ABNORMAL HIGH (ref 0.44–1.00)
GFR calc Af Amer: 18 mL/min — ABNORMAL LOW (ref >60)
GFR calc non Af Amer: 15 mL/min — ABNORMAL LOW (ref >60)
Glucose, Bld: 104 mg/dL — ABNORMAL HIGH (ref 70–99)
Potassium: 5.2 mmol/L — ABNORMAL HIGH (ref 3.5–5.1)
Sodium: 135 mmol/L (ref 135–145)

## 2019-04-07 LAB — CBC
HCT: 24.2 % — ABNORMAL LOW (ref 36.0–46.0)
HCT: 26.1 % — ABNORMAL LOW (ref 36.0–46.0)
Hemoglobin: 7.7 g/dL — ABNORMAL LOW (ref 12.0–15.0)
Hemoglobin: 8.3 g/dL — ABNORMAL LOW (ref 12.0–15.0)
MCH: 34.7 pg — ABNORMAL HIGH (ref 26.0–34.0)
MCH: 34.9 pg — ABNORMAL HIGH (ref 26.0–34.0)
MCHC: 31.8 g/dL (ref 30.0–36.0)
MCHC: 31.8 g/dL (ref 30.0–36.0)
MCV: 109 fL — ABNORMAL HIGH (ref 80.0–100.0)
MCV: 109.7 fL — ABNORMAL HIGH (ref 80.0–100.0)
Platelets: 100 10*3/uL — ABNORMAL LOW (ref 150–400)
Platelets: 102 10*3/uL — ABNORMAL LOW (ref 150–400)
RBC: 2.22 MIL/uL — ABNORMAL LOW (ref 3.87–5.11)
RBC: 2.38 MIL/uL — ABNORMAL LOW (ref 3.87–5.11)
RDW: 15.7 % — ABNORMAL HIGH (ref 11.5–15.5)
RDW: 15.9 % — ABNORMAL HIGH (ref 11.5–15.5)
WBC: 5.7 10*3/uL (ref 4.0–10.5)
WBC: 5.5 10*3/uL (ref 4.0–10.5)
nRBC: 0 % (ref 0.0–0.2)
nRBC: 0 % (ref 0.0–0.2)

## 2019-04-07 LAB — PHOSPHORUS: Phosphorus: 4.1 mg/dL (ref 2.5–4.6)

## 2019-04-07 LAB — PROTEIN / CREATININE RATIO, URINE
Creatinine, Urine: 131 mg/dL
Protein Creatinine Ratio: 0.57 mg/mg{Cre} — ABNORMAL HIGH (ref 0.00–0.15)
Total Protein, Urine: 75 mg/dL

## 2019-04-07 LAB — VANCOMYCIN, RANDOM: Vancomycin Rm: 22

## 2019-04-07 LAB — SARS CORONAVIRUS 2 (TAT 6-24 HRS): SARS Coronavirus 2: NEGATIVE

## 2019-04-07 LAB — VITAMIN B12: Vitamin B-12: 845 pg/mL (ref 180–914)

## 2019-04-07 LAB — FERRITIN: Ferritin: 305 ng/mL (ref 11–307)

## 2019-04-07 LAB — HEPARIN LEVEL (UNFRACTIONATED): Heparin Unfractionated: 0.15 IU/mL — ABNORMAL LOW (ref 0.30–0.70)

## 2019-04-07 LAB — MAGNESIUM: Magnesium: 1.8 mg/dL (ref 1.7–2.4)

## 2019-04-07 MED ORDER — SODIUM CHLORIDE 0.9 % IV BOLUS
500.0000 mL | Freq: Once | INTRAVENOUS | Status: AC
  Administered 2019-04-07: 500 mL via INTRAVENOUS

## 2019-04-07 MED ORDER — CHLORHEXIDINE GLUCONATE CLOTH 2 % EX PADS
6.0000 | MEDICATED_PAD | Freq: Every day | CUTANEOUS | Status: DC
  Administered 2019-04-07 – 2019-04-12 (×5): 6 via TOPICAL

## 2019-04-07 MED ORDER — ROSUVASTATIN CALCIUM 10 MG PO TABS
10.0000 mg | ORAL_TABLET | Freq: Every day | ORAL | Status: DC
  Administered 2019-04-08 – 2019-04-10 (×3): 10 mg via ORAL
  Filled 2019-04-07 (×4): qty 1

## 2019-04-07 NOTE — Progress Notes (Addendum)
PROGRESS NOTE                                                                                                                                                                                                             Patient Demographics:    Chloey Ricard, is a 85 y.o. female, DOB - Mar 06, 1934, ZOX:[id number]  Admit date - 04/06/2019   Admitting Physician Lenore Cordia, MD  Outpatient Primary MD for the patient is Einar Pheasant, MD  LOS - 1        Brief Narrative 85 year old female with COPD/interstitial lung disease with chronic hypoxic respiratory failure on 2 L home O2, CAD s/p PCI, paroxysmal A. fib on Pradaxa, chronic diastolic CHF, history of CVA, type 2 diabetes mellitus, hypertension, hyperlipidemia, thrombocytopenia, seizure disorder and depression who was recently hospitalized and discharged 1 week back secondary to MRSA bacteremia with pneumonia.  She was started on IV vancomycin and plan to treat for 4-week course through a left arm PICC line. She had follow-up labs done as outpatient and found to have acute kidney injury and was told to hold the antibiotic.  At baseline patient has chronic lower extremity swelling/puffiness. Also following hospital discharge patient had an episode of nausea with vomiting and poor p.o. intake.  She was taking ibuprofen as needed for pain for almost 3 times a day recently. Daughter noticed that patient has been increasingly fatigued and sleepy last few days with some confusion.   In the ED vitals were stable.  Blood work showed normal WBC with hemoglobin of 8.2, platelets 120 1K, sodium 131, BUN of 46 and creatinine of 3.07 (worsened from baseline normal of < 1).  Random vancomycin level of 22. Chest x-ray without infiltrate and normal positioning of the PICC line. Admitted for further management of her ATN.   Subjective:   Patient sleepy but arousable.  Knows she is in the  hospital.  Does not communicate much.  Denies any pain.  Noted to have soft blood pressure with systolic in the 81X and mild hypothermia.   Assessment  & Plan :    Principal Problem: Acute tubular necrosis Suspect prerenal versus vancomycin nephrotoxicity..  Also taking NSAIDs at home.  UA unremarkable.  FENa of 0.3% suggestive of prerenal disease.  Strict I's/O. Hold IV vancomycin.  Avoid NSAIDs.  Holding home diuretic. Given  500 cc normal saline in the ED.  Her blood pressure soft this morning and I will order another 500 cc normal saline bolus. Nephrology consulted.    Active Problems: MRSA bacteremia Recent hospitalization with plan on 4 weeks IV vancomycin.  TEE was negative for vegetation.  Random vancomycin level of 22.  Holding further vancomycin with concern for nephrotoxicity and ID consult.  Hyperkalemia Suspect related to AKI/NSAID use.  No intervention needed for now.  Monitor on telemetry.  Recheck labs in a.m.  Chronic diastolic CHF Recent EF of 55-60% on TEE.  Appears euvolemic and has chronic lower extremity edema.  Holding Lasix given ATN.  Monitor I's/O and daily weight.  CAD with history of PCI On Toprol.  Reduce dose of statin given AKI.  No chest pain symptoms.  Macrocytic anemia/thrombocytopenia Hemoglobin stable with slightly dropped platelets.  Monitor while on IV heparin.  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  Hematology planning on bone marrow biopsy as outpatient.  Paroxysmal A. fib Rate controlled.  Continue Toprol once blood pressure stable..  Pradaxa held due to AKI and started on heparin drip.  Essential hypertension Patient hypotensive and metoprolol held  Seizure disorder Continue phenytoin.  Keppra dose reduced given AKI.  Diabetes mellitus type 2, diet controlled Stable.   Continue inpatient monitoring given persistent AKI/ATN requiring close renal function monitoring and IV  fluids intermittently.  Pending consults (renal, ID)  Code Status : Full code  Family Communication  : Will update daughter  Disposition Plan  : Home pending clinical improvement, active ATN   Barriers For Discharge : Active symptoms, acute kidney injury  Consults  : Renal and ID  Procedures  : Renal ultrasound  DVT Prophylaxis  : IV heparin  Lab Results  Component Value Date   PLT 100 (L) 04/07/2019    Antibiotics   Anti-infectives (From admission, onward)   None        Objective:   Vitals:   04/07/19 0114 04/07/19 0452 04/07/19 0938 04/07/19 1153  BP: (!) 121/40  (!) 92/57 (!) 91/53  Pulse: 83  81 (!) 54  Resp: _0 Temp: (!) 97.5 F (36.4 C)  (!) 96.5 F (35.8 C) (!) 96.4 F (35.8 C)  TempSrc: Oral  Oral Axillary  SpO2: 92%  100% 100%  Weight:  99.9 kg    Height:        Wt Readings from Last 3 Encounters:  04/07/19 99.9 kg  01/26/19 104.3 kg  11/08/18 104.3 kg     Intake/Output Summary (Last 24 hours) at 04/07/2019 1226 Last data filed at 04/07/2019 0520 Gross per 24 hour  Intake --  Output 400 ml  Net -400 ml     Physical Exam  Gen: not in distress, sleepy but arousable HEENT: Pallor present, moist mucosa Chest: Clear CVS: S1-S2 regular, no murmurs GI: Soft, nondistended, nontender Musculoskeletal: Puffy lower extremities, nontender     Data Review:    CBC Recent Labs  Lab 04/06/19 1533 04/07/19 0445  WBC 7.1 5.7  HGB 8.2* 7.7*  HCT 25.3* 24.2*  PLT 121* 100*  MCV 106.8* 109.0*  MCH 34.6* 34.7*  MCHC 32.4 31.8  RDW 16.0* 15.7*    Chemistries  Recent Labs  Lab 04/06/19 1533 04/07/19 0445  NA 131* 135  K 4.9 5.2*  CL 99 102  CO2 27 27  GLUCOSE 133* 104*  BUN 46* 47*  CREATININE 3.07* 2.79*  CALCIUM  7.4* 7.4*  MG  --  1.8  AST 43*  --   ALT 14  --   ALKPHOS 103  --   BILITOT 1.4*  --    ------------------------------------------------------------------------------------------------------------------ No  results for input(s): CHOL, HDL, LDLCALC, TRIG, CHOLHDL, LDLDIRECT in the last 72 hours.  Lab Results  Component Value Date   HGBA1C 4.8 01/19/2019   ------------------------------------------------------------------------------------------------------------------ No results for input(s): TSH, T4TOTAL, T3FREE, THYROIDAB in the last 72 hours.  Invalid input(s): FREET3 ------------------------------------------------------------------------------------------------------------------ No results for input(s): VITAMINB12, FOLATE, FERRITIN, TIBC, IRON, RETICCTPCT in the last 72 hours.  Coagulation profile Recent Labs  Lab 04/06/19 2014  INR 2.2*    No results for input(s): DDIMER in the last 72 hours.  Cardiac Enzymes No results for input(s): CKMB, TROPONINI, MYOGLOBIN in the last 168 hours.  Invalid input(s): CK ------------------------------------------------------------------------------------------------------------------    Component Value Date/Time   BNP 251.0 (H) 04/06/2019 1533    Inpatient Medications  Scheduled Meds: . Chlorhexidine Gluconate Cloth  6 each Topical Q0600  . levETIRAcetam  500 mg Oral BID  . metoprolol succinate  25 mg Oral Daily  . mometasone-formoterol  2 puff Inhalation BID  . phenytoin  400 mg Oral QHS  . [START ON 04/08/2019] rosuvastatin  10 mg Oral Daily  . sertraline  75 mg Oral QHS  . sodium chloride flush  3 mL Intravenous Q12H   Continuous Infusions: . heparin 1,200 Units/hr (04/07/19 1204)   PRN Meds:.acetaminophen **OR** acetaminophen, albuterol, ondansetron **OR** ondansetron (ZOFRAN) IV  Micro Results Recent Results (from the past 240 hour(s))  SARS CORONAVIRUS 2 (TAT 6-24 HRS) Nasopharyngeal Nasopharyngeal Swab     Status: None   Collection Time: 04/06/19 11:32 PM   Specimen: Nasopharyngeal Swab  Result Value Ref Range Status   SARS Coronavirus 2 NEGATIVE NEGATIVE Final    Comment: (NOTE) SARS-CoV-2 target nucleic acids are NOT  DETECTED. The SARS-CoV-2 RNA is generally detectable in upper and lower respiratory specimens during the acute phase of infection. Negative results do not preclude SARS-CoV-2 infection, do not rule out co-infections with other pathogens, and should not be used as the sole basis for treatment or other patient management decisions. Negative results must be combined with clinical observations, patient history, and epidemiological information. The expected result is Negative. Fact Sheet for Patients: SugarRoll.be Fact Sheet for Healthcare Providers: https://www.woods-mathews.com/ This test is not yet approved or cleared by the [place] FDA and  has been authorized for detection and/or diagnosis of SARS-CoV-2 by FDA under an Emergency Use Authorization (EUA). This EUA will remain  in effect (meaning this test can be used) for the duration of the COVID-19 declaration under Section 56 4(b)(1) of the Act, 21 U.S.C. section 360bbb-3(b)(1), unless the authorization is terminated or revoked sooner. Performed at [facility], [place] [address] [place], [place] [address]     Radiology Reports DG Chest 2 View  Result Date: 04/06/2019 CLINICAL DATA:  Worsening renal function. EXAM: CHEST - 2 VIEW COMPARISON:  Chest x-ray 03/26/2019, 04/20/2017.  CT 03/26/2019. FINDINGS: Left PICC line noted with tip over superior vena cava. Cardiomegaly with diffuse bilateral interstitial prominence, improved from prior studies. Findings suggest improving interstitial edema. Continued follow-up exam suggested. IMPRESSION: Left PICC line noted with tip over superior vena cava. 2. Cardiomegaly with diffuse bilateral tissue prominence, improved from prior studies. Findings suggest improving interstitial edema. Electronically Signed   By: Marcello Moores  Register   On: 04/06/2019 15:22   CT HEAD WO CONTRAST  Result Date: 03/19/2019 CLINICAL  DATA:  85 year old female with fall.  EXAM: CT HEAD WITHOUT CONTRAST TECHNIQUE: Contiguous axial images were obtained from the base of the skull through the vertex without intravenous contrast. COMPARISON:  Head CT dated 09/08/2018. FINDINGS: Brain: There is moderate age-related atrophy and chronic microvascular ischemic changes. There is no acute intracranial hemorrhage. No mass effect or midline shift. No extra-axial fluid collection. Vascular: No hyperdense vessel or unexpected calcification. Skull: Normal. Negative for fracture or focal lesion. Sinuses/Orbits: There is opacification of the left frontal sinuses and maxillary sinuses with remodeling of the medial wall of the right maxillary sinus. Opacification of the left sphenoid sinuses. No air-fluid level. The mastoid air cells are clear. Other: None IMPRESSION: 1. No acute intracranial pathology. 2. Age-related atrophy and chronic microvascular ischemic changes. 3. Chronic paranasal sinus disease. Electronically Signed   By: Anner Crete M.D.   On: 03/19/2019 22:31   CT ANGIO CHEST PE W OR WO CONTRAST  Result Date: 03/26/2019 CLINICAL DATA:  Shortness of breath. Multiple medical problems. EXAM: CT ANGIOGRAPHY CHEST WITH CONTRAST TECHNIQUE: Multidetector CT imaging of the chest was performed using the standard protocol during bolus administration of intravenous contrast. Multiplanar CT image reconstructions and MIPs were obtained to evaluate the vascular anatomy. CONTRAST:  17m OMNIPAQUE IOHEXOL 350 MG/ML SOLN COMPARISON:  01/26/2019 FINDINGS: Cardiovascular: Heart is markedly enlarged and stable in configuration. There is dense atherosclerotic calcification of the coronary arteries. Atherosclerosis of the thoracic aorta is not associated with aneurysm. Pulmonary arteries are well opacified and there is no evidence for acute pulmonary embolus. Mediastinum/Nodes: The visualized portion of the thyroid gland has a normal appearance. Esophagus is unremarkable. Numerous small mediastinal and  hilar lymph nodes. Axillary regions are unremarkable. Lungs/Pleura: Moderate bilateral pleural effusions, RIGHT greater than LEFT. Dependent atelectasis/consolidation in the posterior LOWER lobes bilaterally. There are focal and confluent ground-glass opacities throughout the lungs, favoring pulmonary edema over infectious process. Upper Abdomen: Cirrhotic morphology of the liver. Spleen appears enlarged. Dilated inferior vena cava consistent with RIGHT heart failure. Musculoskeletal: No chest wall abnormality. No acute or significant osseous findings. Review of the MIP images confirms the above findings. IMPRESSION: 1. Technically adequate exam showing no acute pulmonary embolus. 2. Cardiomegaly and changes of RIGHT heart failure. 3. Moderate bilateral pleural effusions, RIGHT greater than LEFT. 4. Focal and confluent ground-glass opacities throughout the lungs, favoring pulmonary edema over infectious process. 5. Cirrhotic morphology of the liver. 6. Splenomegaly. 7. Aortic Atherosclerosis (ICD10-I70.0). Electronically Signed   By: ENolon NationsM.D.   On: 03/26/2019 12:23   CT CERVICAL SPINE WO CONTRAST  Result Date: 03/23/2019 CLINICAL DATA:  Sepsis and lower extremity edema EXAM: CT CERVICAL SPINE WITHOUT CONTRAST TECHNIQUE: Multidetector CT imaging of the cervical spine was performed without intravenous contrast. Multiplanar CT image reconstructions were also generated. COMPARISON:  09/08/2018 FINDINGS: Alignment: Grade 1 anterolisthesis at C3-4 and C4-5. Reversal of normal cervical lordosis may be positional or due to muscle spasm. Skull base and vertebrae: No acute fracture. Vertebral body heights are maintained. Soft tissues and spinal canal: No prevertebral fluid or swelling. No visible canal hematoma. Disc levels:  No bony spinal canal stenosis. Upper chest: Bilateral pleural effusions. Other: None IMPRESSION: 1. No acute fracture or static subluxation of the cervical spine. 2. Bilateral pleural  effusions. Electronically Signed   By: KUlyses JarredM.D.   On: 03/23/2019 20:55   CT LUMBAR SPINE WO CONTRAST  Result Date: 03/23/2019 CLINICAL DATA:  Sepsis EXAM: CT LUMBAR SPINE WITHOUT CONTRAST TECHNIQUE: Multidetector CT  imaging of the lumbar spine was performed without intravenous contrast administration. Multiplanar CT image reconstructions were also generated. COMPARISON:  CT myelogram 09/10/2004 FINDINGS: Segmentation: Normal Alignment: Grade 1 retrolisthesis at L2-3 and L3-4. Vertebrae: Unchanged chronic compression deformity of L2. L4-S1 posterior instrumented fusion. Paraspinal and other soft tissues: Calcific aortic atherosclerosis and bilateral pleural effusions. Horseshoe kidney. Disc levels: L2-3: Mild spinal canal stenosis due to combination of endplate spurring and disc bulge. Moderate facet hypertrophy. Neural foramina are patent. L3-4: Mild spinal canal stenosis due to disc bulge and facet hypertrophy. No stenosis. L4-5: Status post right laminectomy. No spinal canal or neural foraminal stenosis. L5-S1: Posterior decompression. No spinal canal stenosis. Moderate right and mild left neural foraminal stenosis, unchanged. IMPRESSION: 1. No acute fracture or static subluxation of the lumbar spine. Status post L4-S1 posterior instrumented fusion. No hardware adverse features. 2. Unchanged moderate right and mild left L5-S1 neural foraminal stenosis. 3. Mild L2-3 and L3-4 spinal canal stenosis. 4. Aortic Atherosclerosis (ICD10-I70.0). Electronically Signed   By: Ulyses Jarred M.D.   On: 03/23/2019 21:10   US RENAL  Result Date: 04/07/2019 CLINICAL DATA:  Acute renal failure EXAM: RENAL / URINARY TRACT ULTRASOUND COMPLETE COMPARISON:  None. FINDINGS: Right Kidney: Renal measurements: 10.2 x 3.9 x 3.5 cm. = volume: 72 mL. Mild increased echogenicity is noted. 9 mm cyst is noted within the kidney in the lower pole. Left Kidney: Renal measurements: 10.1 x 4.3 x 3.9 cm. = volume: 88.4 mL. Mild  increased echogenicity is noted. No mass lesion or hydronephrosis is noted. Bladder: Appears normal for degree of bladder distention. Other: Gallstones are seen.  Small effusions are noted bilaterally. IMPRESSION: Mild increased echogenicity within the kidneys bilaterally consistent with medical renal disease. Small right renal cyst. Cholelithiasis. Bilateral pleural effusions. Electronically Signed   By: Inez Catalina M.D.   On: 04/07/2019 00:59   DG Chest Port 1 View  Result Date: 03/26/2019 CLINICAL DATA:  Shortness of breath and chest pain. EXAM: PORTABLE CHEST 1 VIEW COMPARISON:  March 25, 2019 FINDINGS: Stable cardiomegaly. Diffuse interstitial opacity on the left and patchy infiltrate on the right are stable. No interval changes. IMPRESSION: No interval change in bilateral pulmonary opacities. Electronically Signed   By: Dorise Bullion III M.D   On: 03/26/2019 10:28   DG Chest Port 1 View  Result Date: 03/25/2019 CLINICAL DATA:  Community acquired pneumonia. Patient states having a cough and some sob today. Hx of bronchitis, CAD, COPD. Never smoker. EXAM: PORTABLE CHEST 1 VIEW COMPARISON:  Chest radiograph 03/22/2019 FINDINGS: Stable position of a left upper extremity PICC. Unchanged cardiomediastinal contours. There are diffuse bilateral interstitial and airspace opacities, not significantly changed. Probable small bilateral effusions. No pneumothorax. No acute finding in the visualized skeleton. IMPRESSION: Stable diffuse bilateral interstitial and airspace opacities. Electronically Signed   By: Audie Pinto M.D.   On: 03/25/2019 17:53   DG Chest Port 1 View  Result Date: 03/22/2019 CLINICAL DATA:  PICC placement EXAM: PORTABLE CHEST 1 VIEW COMPARISON:  03/18/2019 chest radiograph. FINDINGS: Left PICC terminates at the cavoatrial junction. Stable cardiomediastinal silhouette with mild cardiomegaly. No pneumothorax. Small bilateral pleural effusions, stable. Patchy hazy and linear  interstitial opacities throughout both lungs, similar. IMPRESSION: 1. Left PICC terminates at the cavoatrial junction. 2. Stable small bilateral pleural effusions. 3. Mild cardiomegaly. Patchy hazy and linear interstitial opacities throughout both lungs, similar, either pulmonary edema or atypical pneumonia. Electronically Signed   By: Ilona Sorrel M.D.   On: 03/22/2019 18:17  DG Chest Port 1 View  Result Date: 03/18/2019 CLINICAL DATA:  85 year old female with sepsis. EXAM: PORTABLE CHEST 1 VIEW COMPARISON:  Chest radiograph dated 04/20/2017. FINDINGS: There is chronic interstitial coarsening and bronchitic changes. Overall slight interval progression of interstitial densities since the prior radiograph which may represent progression of background of lung disease although superimposed pneumonia is not excluded. Clinical correlation is recommended. No focal consolidation or pneumothorax. Trace bilateral pleural effusions may be present. There is mild cardiomegaly. Atherosclerotic calcification of the aorta. No acute osseous pathology. IMPRESSION: Slight interval progression of interstitial densities may represent progression of lung disease versus superimposed pneumonia. Electronically Signed   By: Anner Crete M.D.   On: 03/18/2019 23:43   ECHOCARDIOGRAM COMPLETE  Result Date: 03/20/2019    ECHOCARDIOGRAM REPORT   Patient Name:   SUI KASPAREK Date of Exam: 03/20/2019 Medical Rec #:  [id number]        Height:       66.0 in Accession #:    [id number]       Weight:       162.0 lb Date of Birth:  01/01/1935        BSA:          1.828 m Patient Age:    38 years         BP:           57/93 mmHg Patient Gender: F                HR:           114 bpm. Exam Location:  [facility] Procedure: 2D Echo, Cardiac Doppler and Color Doppler Indications:     Dyspnea 786.09  History:         Patient has no prior history of Echocardiogram examinations.                  CAD, COPD; Risk Factors:Hypertension. CVA.  Sonographer:      Sherrie Sport RDCS (AE) Referring Phys:  [id number] [facility] AMIN Diagnosing Phys: Bartholome Bill MD IMPRESSIONS  1. Left ventricular ejection fraction, by estimation, is 70 to 75%. Left ventricular ejection fraction by PLAX is 75 %. The left ventricle has hyperdynamic function. The left ventricle has no regional wall motion abnormalities. Left ventricular diastolic parameters are consistent with Grade I diastolic dysfunction (impaired relaxation).  2. Right ventricular systolic function is normal. The right ventricular size is mildly enlarged. There is moderately elevated pulmonary artery systolic pressure.  3. Left atrial size was mildly dilated.  4. Right atrial size was mildly dilated.  5. The mitral valve is grossly normal. Mild mitral valve regurgitation.  6. The aortic valve is grossly normal. Aortic valve regurgitation is trivial. FINDINGS  Left Ventricle: Left ventricular ejection fraction, by estimation, is 70 to 75%. Left ventricular ejection fraction by PLAX is 75 % The left ventricle has hyperdynamic function. The left ventricle has no regional wall motion abnormalities. The left ventricular internal cavity size was normal in size. There is borderline left ventricular hypertrophy. Left ventricular diastolic parameters are consistent with Grade I diastolic dysfunction (impaired relaxation). Right Ventricle: The right ventricular size is mildly enlarged. No increase in right ventricular wall thickness. Right ventricular systolic function is normal. There is moderately elevated pulmonary artery systolic pressure. The tricuspid regurgitant velocity is 3.27 m/s, and with an assumed right atrial pressure of 10 mmHg, the estimated right ventricular systolic pressure is 46.2 mmHg. Left Atrium: Left atrial size was mildly dilated. Right Atrium:  Right atrial size was mildly dilated. Pericardium: There is no evidence of pericardial effusion. Mitral Valve: The mitral valve is grossly normal. Mild mitral valve  regurgitation. Tricuspid Valve: The tricuspid valve is grossly normal. Tricuspid valve regurgitation is mild. Aortic Valve: The aortic valve is grossly normal. Aortic valve regurgitation is trivial. Aortic valve mean gradient measures 3.5 mmHg. Aortic valve peak gradient measures 7.2 mmHg. Aortic valve area, by VTI measures 2.48 cm. Pulmonic Valve: The pulmonic valve was grossly normal. Pulmonic valve regurgitation is mild. Aorta: The aortic root was not well visualized. IAS/Shunts: The atrial septum is grossly normal.  LEFT VENTRICLE PLAX 2D LV EF:         Left ventricular ejection fraction by PLAX is 75 % LVIDd:         3.69 cm LVIDs:         2.09 cm LV PW:         1.35 cm LV IVS:        0.78 cm LVOT diam:     2.00 cm LV SV:         58 LV SV Index:   32 LVOT Area:     3.14 cm  RIGHT VENTRICLE RV Basal diam:  4.34 cm LEFT ATRIUM            Index       RIGHT ATRIUM           Index LA diam:      4.70 cm  2.57 cm/m  RA Area:     24.40 cm LA Vol (A2C): 158.0 ml 86.41 ml/m RA Volume:   87.20 ml  47.69 ml/m LA Vol (A4C): 52.8 ml  28.88 ml/m  AORTIC VALVE                   PULMONIC VALVE AV Area (Vmax):    2.20 cm    PV Vmax:        0.71 m/s AV Area (Vmean):   2.43 cm    PV Peak grad:   2.0 mmHg AV Area (VTI):     2.48 cm    RVOT Peak grad: 2 mmHg AV Vmax:           134.00 cm/s AV Vmean:          87.550 cm/s AV VTI:            0.234 m AV Peak Grad:      7.2 mmHg AV Mean Grad:      3.5 mmHg LVOT Vmax:         93.80 cm/s LVOT Vmean:        67.600 cm/s LVOT VTI:          0.185 m LVOT/AV VTI ratio: 0.79  AORTA Ao Root diam: 3.10 cm MITRAL VALVE                TRICUSPID VALVE MV Area (PHT): 4.89 cm     TR Peak grad:   42.8 mmHg MV Decel Time: 155 msec     TR Vmax:        327.00 cm/s MV E velocity: 127.00 cm/s MV A velocity: 128.00 cm/s  SHUNTS MV E/A ratio:  0.99         Systemic VTI:  0.18 m                             Systemic Diam: 2.00 cm Bartholome Bill MD  Electronically signed by Bartholome Bill MD Signature  Date/Time: 03/20/2019/4:00:44 PM    Final    ECHO TEE  Result Date: 03/21/2019    TRANSESOPHOGEAL ECHO REPORT   Patient Name:   KAYSON BULLIS Date of Exam: 03/21/2019 Medical Rec #:  [id number]        Height:       66.0 in Accession #:    [id number]       Weight:       238.5 lb Date of Birth:  1934/03/07        BSA:          2.155 m Patient Age:    48 years         BP:           107/40 mmHg Patient Gender: F                HR:           111 bpm. 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 COPD; Risk Factors:Hypertension and Diabetes.                  CVA.  Sonographer:     Sherrie Sport RDCS (AE) Referring Phys:  [place] Diagnosing Phys: Bartholome Bill MD PROCEDURE: TEE procedure time was 16 minutes. The transesophogeal probe was passed without difficulty through the esophogus of the patient. Imaged were obtained with the patient in a left lateral decubitus position. Local oropharyngeal anesthetic was provided with viscous lidocaine and Benzocaine spray. Sedation performed by performing physician. Image quality was excellent. The patient's vital signs; including heart rate, blood pressure, and oxygen saturation; remained stable throughout the procedure. The patient developed no complications during the procedure. IMPRESSIONS  1. Left ventricular ejection fraction, by estimation, is 55 to 60%. The left ventricle has normal function. The left ventricle has no regional wall motion abnormalities.  2. Right ventricular systolic function is normal. The right ventricular size is mildly enlarged.  3. No left atrial/left atrial appendage thrombus was detected.  4. Right atrial size was moderately dilated.  5. The mitral valve is grossly normal. Mild mitral valve regurgitation.  6. Tricuspid valve  regurgitation is mild to moderate.  7. The aortic valve is tricuspid. Aortic valve regurgitation is trivial. No aortic stenosis is present. Conclusion(s)/Recommendation(s): No evidence of vegetation/infective endocarditis on this transesophageal echocardiogram. FINDINGS  Left Ventricle: Left ventricular ejection fraction, by estimation, is 55 to 60%. The left ventricle has normal function. The left ventricle has no regional wall motion abnormalities. The left ventricular internal cavity size was normal in size. There is  no left ventricular hypertrophy. Right Ventricle: The right ventricular size is mildly enlarged. No increase in right ventricular wall thickness. Right ventricular systolic function is normal. Left Atrium: Left atrial size was normal in size. No left atrial/left atrial appendage thrombus was detected. Right Atrium: Right atrial size was moderately dilated. Pericardium: There is no evidence of pericardial effusion. Mitral Valve: The mitral valve is grossly normal. Mild mitral valve regurgitation. There is no evidence of mitral valve vegetation. Tricuspid Valve: The tricuspid valve is grossly normal. Tricuspid valve regurgitation is mild to moderate. Aortic Valve: The aortic valve  is tricuspid. Aortic valve regurgitation is trivial. No aortic stenosis is present. There is no evidence of aortic valve vegetation. Pulmonic Valve: The pulmonic valve was not well visualized. Pulmonic valve regurgitation is trivial. Aorta: The aortic root is normal in size and structure. IAS/Shunts: The atrial septum is grossly normal. Bartholome Bill MD Electronically signed by Bartholome Bill MD Signature Date/Time: 03/21/2019/10:50:39 AM    Final    [place] EKG SITE RITE  Result Date: 03/22/2019 If Site Rite image not attached, placement could not be confirmed due to current cardiac rhythm.   Time Spent in minutes 35   Lynnita Somma M.D on 04/07/2019 at 12:26 PM  Between 7am to 7pm - Pager - [phone]  After 7pm go  to [email or web address] - password [facility]  [facility] -  Office  [phone]

## 2019-04-07 NOTE — Progress Notes (Signed)
ANTICOAGULATION CONSULT NOTE - Initial Consult  Pharmacy Consult for Heparin  Indication: atrial fibrillation  Allergies  Allergen Reactions  . Doxycycline Diarrhea and Nausea Only  . Methotrexate Rash    Patient Measurements: Height: 5\' 5"  (165.1 cm) Weight: 99.9 kg (220 lb 3.2 oz) IBW/kg (Calculated) : 57 Heparin Dosing Weight: 73.7 kg   Vital Signs: Temp: 97.6 F (36.4 C) (04/02 1807) Temp Source: Oral (04/02 1807) BP: 103/47 (04/02 1807) Pulse Rate: 109 (04/02 1807)  Labs: Recent Labs    04/06/19 1533 04/06/19 1533 04/06/19 2014 04/07/19 0445 04/07/19 1721 04/07/19 1954  HGB 8.2*   < >  --  7.7* 8.3*  --   HCT 25.3*  --   --  24.2* 26.1*  --   PLT 121*  --   --  100* 102*  --   APTT  --   --  98*  --   --   --   LABPROT  --   --  24.7*  --   --   --   INR  --   --  2.2*  --   --   --   HEPARINUNFRC  --   --   --   --   --  0.15*  CREATININE 3.07*  --   --  2.79*  --   --    < > = values in this interval not displayed.    Estimated Creatinine Clearance: 18.2 mL/min (A) (by C-G formula based on SCr of 2.79 mg/dL (H)).   Medical History: Past Medical History:  Diagnosis Date  . Acute bronchitis   . Allergic rhinitis   . Arthritis   . CAD (coronary artery disease)    s/p stent LAD 8/03 and stent placement OM1  . Cancer ([place])    skin  . Chronic back pain   . COPD with asthma ([place])   . CVA (cerebral vascular accident) ([place])   . Diabetes mellitus ([place])   . Hypercholesterolemia   . Hypertension   . Seizure disorder ([place])     Medications:  Medications Prior to Admission  Medication Sig Dispense Refill Last Dose  . albuterol (PROVENTIL) (2.5 MG/3ML) 0.083% nebulizer solution USE ONE VIAL VIA NEBULZIER EVERY FOUR HOURS AS NEEDED FOR WHEEZING 540 mL 1 Unknown at PRN  . albuterol (VENTOLIN HFA) 108 (90 Base) MCG/ACT inhaler USE 2 PUFFS EVERY 4 HOURS AS DIRECTED - RESCUE (Patient taking differently: Inhale 2 puffs into the lungs every 4 (four) hours as  needed for wheezing or shortness of breath. ) 18 g 1 Unknown at PRN  . budesonide-formoterol (SYMBICORT) 80-4.5 MCG/ACT inhaler INHALE 2 PUFFS TWICE A DAY (Patient taking differently: Inhale 2 puffs into the lungs 2 (two) times daily. ) 10.2 g 3 04/06/2019 at 1100  . dabigatran (PRADAXA) 150 MG CAPS Take 150 mg by mouth every 12 (twelve) hours.   04/06/2019 at 1100  . gabapentin (NEURONTIN) 100 MG capsule TAKE ONE CAPSULE BY MOUTH TWICE A DAY (Patient taking differently: Take 200 mg by mouth at bedtime. ) 60 capsule 1 04/05/2019 at 2100  . levETIRAcetam (KEPPRA) 750 MG tablet Take 1 tablet (750 mg total) by mouth 2 (two) times daily. 180 tablet 0 04/06/2019 at 1100  . metoprolol succinate (TOPROL-XL) 25 MG 24 hr tablet Take 1 tablet (25 mg total) by mouth daily. 30 tablet 0 04/06/2019 at 1100  . Omega-3 Fatty Acids (FISH OIL) 1000 MG CAPS Take 1,000 mg by mouth daily.      [name]  phenytoin (DILANTIN) 100 MG ER capsule Take 4 capsules (400 mg total) by mouth at bedtime. 360 capsule 0 04/05/2019 at 2100  . polyethylene glycol (MIRALAX / GLYCOLAX) 17 g packet Take 17 g by mouth daily as needed for mild constipation or moderate constipation.    Unknown at PRN  . potassium chloride (KLOR-CON) 10 MEQ tablet Take 1 tablet (10 mEq total) by mouth daily. 90 tablet 1 04/06/2019 at 1100  . rosuvastatin (CRESTOR) 20 MG tablet TAKE 1 TABLET BY MOUTH DAILY (Patient taking differently: Take 20 mg by mouth daily. ) 30 tablet 5 04/05/2019 at Unknown time  . sertraline (ZOLOFT) 50 MG tablet Take 75 mg by mouth at bedtime.    04/05/2019 at 2100  . vancomycin IVPB Inject 750 mg into the vein daily for 21 days. 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 Labs - Thursday:  BMP, CRP and vancomycin trough 21 Units 0     Assessment: Pharmacy consulted to dose heparin for Afib in this 85 year old female admitted with ARF.  Pt was on Pradaxa  150 mg PO BID at home, last dose was on 4/1 @ 1100.  CrC = 14.7 ml/min   Goal of Therapy:  Heparin level 0.3-0.7 units/ml Monitor platelets by anticoagulation protocol: Yes   Plan:  -4/2 @ 1954 HL 0.15, subtherapeutic. Will increase drip rate to 1450 units/hr. No bolus in view of thrombocytopenia.  -Heparin level 8 hours after rate change -Daily CBC per protocol  [facility] Resident 04/07/2019,9:00 PM

## 2019-04-07 NOTE — Consult Note (Addendum)
NAME: Brooke Baker  DOB: June 17, 1934  MRN: [id number]  Date/Time: 04/07/2019 11:14 AM  REQUESTING PROVIDER:Patel Subjective:  REASON FOR CONSULT: MRSA bacteremia ?History from chart and daughter Pt a poor historian Brooke Baker is a 85 y.o. with a history of recent MRSA infection sent home on IV vancomycin on 03/20/19, DM, Asthma, ILD, CAD, CVA, Afib admitted from home because of hypoxia and also AKI When patient was discharged home her cr < 1 but cr done on 3/29 showed an increase to 2.48 and Vanco level was 189. Vancomycin was put on hold on 3/31 and a repeat lab was sent by home nurse on 4/1. On 4/1 when the nurse checked her pulse ox it was in low 70s Pt was in the [facility] between 3/13-3/24/21 for MRSA bacteremia with no identifiable source- Was seen by ID Dr.Fitzgerald. TEE neg, pt has Rt TKA, Rt THA and hardware in lumbar spine and femurhardware. Plan was to give her 4 weeks of IV vancomycin- she was sent to [facility]  on 3/24 and then went home  In the ED, initial vitals showed BP 110/55, pulse 100, temp 98.0 Fahrenheit, SPO2 96% on 2 L supplemental O2 via [place]. Last day of antibiotic 4/10 Past Medical History:  Diagnosis Date  . Acute bronchitis   . Allergic rhinitis   . Arthritis   . CAD (coronary artery disease)    s/p stent LAD 8/03 and stent placement OM1  . Cancer ([place])    skin  . Chronic back pain   . COPD with asthma ([place])   . CVA (cerebral vascular accident) ([place])   . Diabetes mellitus ([place])   . Hypercholesterolemia   . Hypertension   . Seizure disorder [facility])     Past Surgical History:  Procedure Laterality Date  . BREAST BIOPSY Bilateral    [place]  . CORONARY STENT PLACEMENT  8/03   LAD  . CORONARY STENT PLACEMENT     OM1  2006  . FEMUR FRACTURE SURGERY Right [id number]   Dr. Marry Guan  . LUMBAR SPINE SURGERY     x3  . TEE WITHOUT CARDIOVERSION N/A 03/21/2019   Procedure: TRANSESOPHAGEAL ECHOCARDIOGRAM (TEE);  Surgeon: Teodoro Spray, MD;  Location: [facility]  ORS;  Service: Cardiovascular;  Laterality: N/A;  . TOTAL ABDOMINAL HYSTERECTOMY     appendectomy - age 38  . TOTAL HIP ARTHROPLASTY-2009 Rt hip    . TOTAL KNEE ARTHROPLASTY-2005-rt knee     2005   ORIF rt femoral shaft fracture 2014 Social History   Socioeconomic History  . Marital status: Married    Spouse name: Not on file  . Number of children: 3  . Years of education: hs  . Highest education level: Not on file  Occupational History  . Occupation: retired Psychologist, sport and exercise: RETIRED  Tobacco Use  . Smoking status: Never Smoker  . Smokeless tobacco: Never Used  Substance and Sexual Activity  . Alcohol use: No    Alcohol/week: 0.0 standard drinks  . Drug use: No  . Sexual activity: Never  Other Topics Concern  . Not on file  Social History Narrative   lives with husband- Edsel   Social Determinants of Health   Financial Resource Strain:   . Difficulty of Paying Living Expenses:   Food Insecurity:   . Worried About Charity fundraiser in the Last Year:   . [place] in the Last Year:   Transportation Needs:   . Lack of  Transportation (Medical):   [name] Lack of Transportation (Non-Medical):   Physical Activity:   . Days of Exercise per Week:   . Minutes of Exercise per Session:   Stress:   . Feeling of Stress :   Social Connections:   . Frequency of Communication with Friends and Family:   . Frequency of Social Gatherings with Friends and Family:   . Attends Religious Services:   . Active Member of Clubs or Organizations:   . Attends Archivist Meetings:   [name] Marital Status:   Intimate Partner Violence:   . Fear of Current or Ex-Partner:   . Emotionally Abused:   [name] Physically Abused:   . Sexually Abused:     Family History  Problem Relation Age of Onset  . Allergies Brother   . Cancer Brother   . Allergies Sister   . Cancer Sister        Non-hodgkins lymphoma  . Asthma Sister   . Rheum arthritis Sister   . Stroke Mother   . Asthma  Brother   . Rheum arthritis Brother   . Cancer Sister        non-hodgkins lymphoma  . Diabetes Brother    Allergies  Allergen Reactions  . Doxycycline Diarrhea and Nausea Only  . Methotrexate Rash   ? Current Facility-Administered Medications  Medication Dose Route Frequency Provider Last Rate Last Admin  . acetaminophen (TYLENOL) tablet 650 mg  650 mg Oral Q6H PRN Lenore Cordia, MD       Or  . acetaminophen (TYLENOL) suppository 650 mg  650 mg Rectal Q6H PRN Zada Finders R, MD      . albuterol (VENTOLIN HFA) 108 (90 Base) MCG/ACT inhaler 2 puff  2 puff Inhalation Q4H PRN Lenore Cordia, MD      . Chlorhexidine Gluconate Cloth 2 % PADS 6 each  6 each Topical Q0600 Dhungel, Nishant, MD      . heparin ADULT infusion 100 units/mL (25000 units/219mL sodium chloride 0.45%)  1,200 Units/hr Intravenous Continuous Patel, Vishal R, MD      . heparin bolus via infusion 4,000 Units  4,000 Units Intravenous Once Zada Finders R, MD      . levETIRAcetam (KEPPRA) tablet 500 mg  500 mg Oral BID Zada Finders R, MD   500 mg at 04/07/19 0946  . metoprolol succinate (TOPROL-XL) 24 hr tablet 25 mg  25 mg Oral Daily Lenore Cordia, MD   Stopped at 04/07/19 [phone]  . mometasone-formoterol (DULERA) 100-5 MCG/ACT inhaler 2 puff  2 puff Inhalation BID Lenore Cordia, MD   2 puff at 04/07/19 0945  . ondansetron (ZOFRAN) tablet 4 mg  4 mg Oral Q6H PRN Lenore Cordia, MD       Or  . ondansetron (ZOFRAN) injection 4 mg  4 mg Intravenous Q6H PRN Zada Finders R, MD      . phenytoin (DILANTIN) ER capsule 400 mg  400 mg Oral QHS Zada Finders R, MD   400 mg at 04/06/19 2234  . [START ON 04/08/2019] rosuvastatin (CRESTOR) tablet 10 mg  10 mg Oral Daily Dhungel, Nishant, MD      . sertraline (ZOLOFT) tablet 75 mg  75 mg Oral QHS Lenore Cordia, MD   75 mg at 04/06/19 2234  . sodium chloride flush (NS) 0.9 % injection 3 mL  3 mL Intravenous Q12H Lenore Cordia, MD   3 mL at 04/07/19 0946     Abtx:   Anti-infectives (  From admission, onward)   None      REVIEW OF SYSTEMS:  Const: negative fever, negative chills, negative weight loss Eyes: negative diplopia or visual changes, negative eye pain ENT: negative coryza, negative sore throat Resp: negative cough, positive dyspnea Cards: negative for chest pain, palpitations, lower extremity edema GU: negative for frequency, dysuria and hematuria GI: Negative for abdominal pain, diarrhea, bleeding, constipation Skin: negative for rash and pruritus Heme: negative for easy bruising and gum/nose bleeding MS: generalized weakness Neurolo:negative for headaches, dizziness, vertigo, some memory problems  Psych: negative for feelings of anxiety, depression  Endocrine:h/o, diabetes Allergy/Immunology-as above Objective:  VITALS:  BP (!) 92/57 (BP Location: Right Arm)   Pulse 81   Temp (!) 96.5 F (35.8 C) (Oral)   Resp 16   Ht 5\' 5"  (1.651 m)   Wt 99.9 kg   LMP 08/17/1965   SpO2 100%   BMI 36.64 kg/m  PHYSICAL EXAM:  General: Alert, cooperative, no distress, appears stated age.  Head: Normocephalic, without obvious abnormality, atraumatic. Eyes: Conjunctivae clear, anicteric sclerae. Pupils are equal- bruise over left eyebrow area ENT Nares normal. No drainage or sinus tenderness. Lips, mucosa, and tongue normal. No Thrush Neck: Supple, symmetrical, no adenopathy, thyroid: non tender no carotid bruit and no JVD. Back: No CVA tenderness. Lungs:b/l air entry Heart:irregular Abdomen: Soft, non-tender,not distended. Bowel sounds normal. No masses Extremities: atraumatic, no cyanosis. ++ edema. No clubbing. Left PICC Skin:scaly patches over leg, sacrum Lymph: Cervical, supraclavicular normal. Neurologic: Grossly non-focal Pertinent Labs Lab Results CBC    Component Value Date/Time   WBC 5.7 04/07/2019 0445   RBC 2.22 (L) 04/07/2019 0445   HGB 7.7 (L) 04/07/2019 0445   HGB 8.5 (L) 05/17/2012 0503   HCT 24.2 (L) 04/07/2019  0445   HCT 30.2 (L) 05/12/2012 0722   PLT 100 (L) 04/07/2019 0445   PLT 163 05/15/2012 0551   MCV 109.0 (H) 04/07/2019 0445   MCV 97 05/12/2012 0722   MCH 34.7 (H) 04/07/2019 0445   MCHC 31.8 04/07/2019 0445   RDW 15.7 (H) 04/07/2019 0445   RDW 13.3 05/12/2012 0722   LYMPHSABS 0.8 03/24/2019 0500   LYMPHSABS 0.9 (L) 05/12/2012 0722   MONOABS 0.8 03/24/2019 0500   MONOABS 1.7 (H) 05/12/2012 0722   EOSABS 0.7 (H) 03/24/2019 0500   EOSABS 0.3 05/12/2012 0722   BASOSABS 0.0 03/24/2019 0500   BASOSABS 0.2 (H) 05/12/2012 0722    CMP Latest Ref Rng & Units 04/07/2019 04/06/2019 03/29/2019  Glucose 70 - 99 mg/dL 104(H) 133(H) 108(H)  BUN 8 - 23 mg/dL 47(H) 46(H) 16  Creatinine 0.44 - 1.00 mg/dL 2.79(H) 3.07(H) 0.83  Sodium 135 - 145 mmol/L 135 131(L) 136  Potassium 3.5 - 5.1 mmol/L 5.2(H) 4.9 3.7  Chloride 98 - 111 mmol/L 102 99 101  CO2 22 - 32 mmol/L 27 27 29   Calcium 8.9 - 10.3 mg/dL 7.4(L) 7.4(L) 7.4(L)  Total Protein 6.5 - 8.1 g/dL - 7.0 -  Total Bilirubin 0.3 - 1.2 mg/dL - 1.4(H) -  Alkaline Phos 38 - 126 U/L - 103 -  AST 15 - 41 U/L - 43(H) -  ALT 0 - 44 U/L - 14 -      Microbiology: Recent Results (from the past 240 hour(s))  SARS CORONAVIRUS 2 (TAT 6-24 HRS) Nasopharyngeal Nasopharyngeal Swab     Status: None   Collection Time: 04/06/19 11:32 PM   Specimen: Nasopharyngeal Swab  Result Value Ref Range Status   SARS Coronavirus 2 NEGATIVE  NEGATIVE Final    Comment: (NOTE) SARS-CoV-2 target nucleic acids are NOT DETECTED. The SARS-CoV-2 RNA is generally detectable in upper and lower respiratory specimens during the acute phase of infection. Negative results do not preclude SARS-CoV-2 infection, do not rule out co-infections with other pathogens, and should not be used as the sole basis for treatment or other patient management decisions. Negative results must be combined with clinical observations, patient history, and epidemiological information. The expected result is  Negative. Fact Sheet for Patients: SugarRoll.be Fact Sheet for Healthcare Providers: https://www.woods-mathews.com/ This test is not yet approved or cleared by the [place] FDA and  has been authorized for detection and/or diagnosis of SARS-CoV-2 by FDA under an Emergency Use Authorization (EUA). This EUA will remain  in effect (meaning this test can be used) for the duration of the COVID-19 declaration under Section 56 4(b)(1) of the Act, 21 U.S.C. section 360bbb-3(b)(1), unless the authorization is terminated or revoked sooner. Performed at [facility], [place] [address], [place], [place] [address]     IMAGING RESULTS: Cardiomegaly with diffuse bilateral tissue prominence, improved from prior studies. Findings suggest improving interstitial edema I have personally reviewed the films ? Impression/Recommendation ? ?AKI- combination of meds likely causing this- vanco with frusemide and ACEI. All three meds have been stopped.  Recent MRSA bacteremia- unclear source( possible skin as she has psoriasis- neg TEE) she was discharge don vanco until 04/15/19 As VAnco level 22 an dbecause of AKI- will not give it further. Check vanco level everyday and once it reaches 15, daptomycin can be started to complete the course. Currently no antibiotic needed as vanco is supra therapeutic ! Will hold rosuvastatin once dapto is started  Afib  Anemia  Thrombocytopenia  CHF- edema legs- received frusemide ? ___________________________________________________ Discussed with patient, and family.

## 2019-04-07 NOTE — Consult Note (Signed)
[facility] CONSULT NOTE    Date: 04/07/2019                  Patient Name:  Brooke Baker  MRN: [id number]  DOB: [birth year]  Age / Sex: 85 y.o., female         PCP: Einar Pheasant, MD                 Service Requesting Consult:  Hospitalist                 Reason for Consult:  Acute kidney injury            History of Present Illness: Patient is a 85 y.o. female with a PMHx of COPD, interstitial lung disease with chronic hypoxic respiratory failure on home oxygen, coronary disease status post PCI, paroxysmal atrial fibrillation, diastolic heart failure, history of CVA, diabetes mellitus type 2, hypertension, hyperlipidemia, seizure disorder, thrombocytopenia, depression, who was admitted to [facility] on 04/06/2019 for evaluation of acute kidney injury in the setting of recent MRSA bacteremia and pneumonia.  Patient was receiving vancomycin via left arm PICC line.  Most recent vancomycin level was slightly high at 22.  In addition apparently she was taking ibuprofen at home sometimes 3 times a day.  She is also had poor p.o. intake otherwise.  Patient has been having increasing fatigue as well.  She is currently quite lethargic and unable to provide any history.  Renal ultrasound was unremarkable for hydronephrosis.  Creatinine was 3.07 upon admission and now down to 2.79.  Her baseline creatinine appears to be 0.8.   Medications: Outpatient medications: Medications Prior to Admission  Medication Sig Dispense Refill Last Dose  . albuterol (PROVENTIL) (2.5 MG/3ML) 0.083% nebulizer solution USE ONE VIAL VIA NEBULZIER EVERY FOUR HOURS AS NEEDED FOR WHEEZING 540 mL 1 Unknown at PRN  . albuterol (VENTOLIN HFA) 108 (90 Base) MCG/ACT inhaler USE 2 PUFFS EVERY 4 HOURS AS DIRECTED - RESCUE (Patient taking differently: Inhale 2 puffs into the lungs every 4 (four) hours as needed for wheezing or shortness of breath. ) 18 g 1 Unknown at PRN  . budesonide-formoterol (SYMBICORT)  80-4.5 MCG/ACT inhaler INHALE 2 PUFFS TWICE A DAY (Patient taking differently: Inhale 2 puffs into the lungs 2 (two) times daily. ) 10.2 g 3 04/06/2019 at 1100  . dabigatran (PRADAXA) 150 MG CAPS Take 150 mg by mouth every 12 (twelve) hours.   04/06/2019 at 1100  . gabapentin (NEURONTIN) 100 MG capsule TAKE ONE CAPSULE BY MOUTH TWICE A DAY (Patient taking differently: Take 200 mg by mouth at bedtime. ) 60 capsule 1 04/05/2019 at 2100  . levETIRAcetam (KEPPRA) 750 MG tablet Take 1 tablet (750 mg total) by mouth 2 (two) times daily. 180 tablet 0 04/06/2019 at 1100  . metoprolol succinate (TOPROL-XL) 25 MG 24 hr tablet Take 1 tablet (25 mg total) by mouth daily. 30 tablet 0 04/06/2019 at 1100  . Omega-3 Fatty Acids (FISH OIL) 1000 MG CAPS Take 1,000 mg by mouth daily.      . phenytoin (DILANTIN) 100 MG ER capsule Take 4 capsules (400 mg total) by mouth at bedtime. 360 capsule 0 04/05/2019 at 2100  . polyethylene glycol (MIRALAX / GLYCOLAX) 17 g packet Take 17 g by mouth daily as needed for mild constipation or moderate constipation.    Unknown at PRN  . potassium chloride (KLOR-CON) 10 MEQ tablet Take 1 tablet (10 mEq total) by mouth daily. 90 tablet 1 04/06/2019  at 1100  . rosuvastatin (CRESTOR) 20 MG tablet TAKE 1 TABLET BY MOUTH DAILY (Patient taking differently: Take 20 mg by mouth daily. ) 30 tablet 5 04/05/2019 at Unknown time  . sertraline (ZOLOFT) 50 MG tablet Take 75 mg by mouth at bedtime.    04/05/2019 at 2100  . vancomycin IVPB Inject 750 mg into the vein daily for 21 days. 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 Labs - Thursday:  BMP, CRP and vancomycin trough 21 Units 0     Current medications: Current Facility-Administered Medications  Medication Dose Route Frequency Provider Last Rate Last Admin  . acetaminophen (TYLENOL) tablet 650 mg  650 mg Oral Q6H PRN Lenore Cordia, MD       Or  .  acetaminophen (TYLENOL) suppository 650 mg  650 mg Rectal Q6H PRN Zada Finders R, MD      . albuterol (VENTOLIN HFA) 108 (90 Base) MCG/ACT inhaler 2 puff  2 puff Inhalation Q4H PRN Zada Finders R, MD      . Chlorhexidine Gluconate Cloth 2 % PADS 6 each  6 each Topical Q0600 Dhungel, Nishant, MD   6 each at 04/07/19 1151  . heparin ADULT infusion 100 units/mL (25000 units/243mL sodium chloride 0.45%)  1,200 Units/hr Intravenous Continuous Zada Finders R, MD 12 mL/hr at 04/07/19 1204 1,200 Units/hr at 04/07/19 1204  . levETIRAcetam (KEPPRA) tablet 500 mg  500 mg Oral BID Zada Finders R, MD   500 mg at 04/07/19 0946  . metoprolol succinate (TOPROL-XL) 24 hr tablet 25 mg  25 mg Oral Daily Lenore Cordia, MD   Stopped at 04/07/19 [phone]  . mometasone-formoterol (DULERA) 100-5 MCG/ACT inhaler 2 puff  2 puff Inhalation BID Lenore Cordia, MD   2 puff at 04/07/19 0945  . ondansetron (ZOFRAN) tablet 4 mg  4 mg Oral Q6H PRN Lenore Cordia, MD       Or  . ondansetron (ZOFRAN) injection 4 mg  4 mg Intravenous Q6H PRN Zada Finders R, MD      . phenytoin (DILANTIN) ER capsule 400 mg  400 mg Oral QHS Zada Finders R, MD   400 mg at 04/06/19 2234  . [START ON 04/08/2019] rosuvastatin (CRESTOR) tablet 10 mg  10 mg Oral Daily Dhungel, Nishant, MD      . sertraline (ZOLOFT) tablet 75 mg  75 mg Oral QHS Lenore Cordia, MD   75 mg at 04/06/19 2234  . sodium chloride flush (NS) 0.9 % injection 3 mL  3 mL Intravenous Q12H Lenore Cordia, MD   3 mL at 04/07/19 0946      Allergies: Allergies  Allergen Reactions  . Doxycycline Diarrhea and Nausea Only  . Methotrexate Rash      Past Medical History: Past Medical History:  Diagnosis Date  . Acute bronchitis   . Allergic rhinitis   . Arthritis   . CAD (coronary artery disease)    s/p stent LAD 8/03 and stent placement OM1  . Cancer ([place])    skin  . Chronic back pain   . COPD with asthma ([place])   . CVA (cerebral vascular accident) ([place])   . Diabetes  mellitus ([place])   . Hypercholesterolemia   . Hypertension   . Seizure disorder [facility])      Past Surgical History: Past Surgical History:  Procedure Laterality Date  . BREAST BIOPSY Bilateral    axillas  .  CORONARY STENT PLACEMENT  8/03   LAD  . CORONARY STENT PLACEMENT     OM1  2006  . FEMUR FRACTURE SURGERY Right [id number]   Dr. Marry Guan  . LUMBAR SPINE SURGERY     x3  . TEE WITHOUT CARDIOVERSION N/A 03/21/2019   Procedure: TRANSESOPHAGEAL ECHOCARDIOGRAM (TEE);  Surgeon: Teodoro Spray, MD;  Location: [facility] ORS;  Service: Cardiovascular;  Laterality: N/A;  . TOTAL ABDOMINAL HYSTERECTOMY     appendectomy - age 22  . TOTAL HIP ARTHROPLASTY    . TOTAL KNEE ARTHROPLASTY     2005     Family History: Family History  Problem Relation Age of Onset  . Allergies Brother   . Cancer Brother   . Allergies Sister   . Cancer Sister        Non-hodgkins lymphoma  . Asthma Sister   . Rheum arthritis Sister   . Stroke Mother   . Asthma Brother   . Rheum arthritis Brother   . Cancer Sister        non-hodgkins lymphoma  . Diabetes Brother      Social History: Social History   Socioeconomic History  . Marital status: Married    Spouse name: Not on file  . Number of children: 3  . Years of education: hs  . Highest education level: Not on file  Occupational History  . Occupation: retired Psychologist, sport and exercise: RETIRED  Tobacco Use  . Smoking status: Never Smoker  . Smokeless tobacco: Never Used  Substance and Sexual Activity  . Alcohol use: No    Alcohol/week: 0.0 standard drinks  . Drug use: No  . Sexual activity: Never  Other Topics Concern  . Not on file  Social History Narrative   lives with husband- Edsel   Social Determinants of Health   Financial Resource Strain:   . Difficulty of Paying Living Expenses:   Food Insecurity:   . Worried About Charity fundraiser in the Last Year:   . Arboriculturist in the Last Year:   Transportation Needs:   . Lexicographer (Medical):   [name] Lack of Transportation (Non-Medical):   Physical Activity:   . Days of Exercise per Week:   . Minutes of Exercise per Session:   Stress:   . Feeling of Stress :   Social Connections:   . Frequency of Communication with Friends and Family:   . Frequency of Social Gatherings with Friends and Family:   . Attends Religious Services:   . Active Member of Clubs or Organizations:   . Attends Archivist Meetings:   [name] Marital Status:   Intimate Partner Violence:   . Fear of Current or Ex-Partner:   . Emotionally Abused:   [name] Physically Abused:   . Sexually Abused:      Review of Systems: Patient unable to provide review of systems secondary to lethargy  Vital Signs: Blood pressure (!) 91/53, pulse (!) 54, temperature (!) 96.4 F (35.8 C), temperature source Axillary, resp. rate 16, height 5\' 5"  (1.651 m), weight 99.9 kg, last menstrual period 08/17/1965, SpO2 100 %.  Weight trends: Filed Weights   04/06/19 1514 04/07/19 0452  Weight: 79.4 kg 99.9 kg    Physical Exam: General: Lethargic  Head: Normocephalic, left periorbital ecchymosis noted  Eyes: Left periorbital ecchymosis  Nose: Mucous membranes moist, not inflammed, nonerythematous.  Throat: Unable to visualize oropharynx as patient not fully cooperative with exam  Neck: Supple, trachea  midline.  Lungs:  Normal respiratory effort. Clear to auscultation BL without crackles or wheezes.  Heart: S1S2 no obvious rub  Abdomen:  BS normoactive. Soft, Nondistended, non-tender.  No masses or organomegaly.  Extremities: No pretibial edema.  Neurologic: Lethargic, not following commands  Skin: No visible rashes, scars.    Lab results: Basic Metabolic Panel: Recent Labs  Lab 04/06/19 1533 04/07/19 0445  NA 131* 135  K 4.9 5.2*  CL 99 102  CO2 27 27  GLUCOSE 133* 104*  BUN 46* 47*  CREATININE 3.07* 2.79*  CALCIUM 7.4* 7.4*  MG  --  1.8  PHOS  --  4.1    Liver Function  Tests: Recent Labs  Lab 04/06/19 1533  AST 43*  ALT 14  ALKPHOS 103  BILITOT 1.4*  PROT 7.0  ALBUMIN 1.9*   No results for input(s): LIPASE, AMYLASE in the last 168 hours. No results for input(s): AMMONIA in the last 168 hours.  CBC: Recent Labs  Lab 04/06/19 1533 04/07/19 0445  WBC 7.1 5.7  HGB 8.2* 7.7*  HCT 25.3* 24.2*  MCV 106.8* 109.0*  PLT 121* 100*    Cardiac Enzymes: No results for input(s): CKTOTAL, CKMB, CKMBINDEX, TROPONINI in the last 168 hours.  BNP: Invalid input(s): POCBNP  CBG: No results for input(s): GLUCAP in the last 168 hours.  Microbiology: Results for orders placed or performed during the hospital encounter of 04/06/19  SARS CORONAVIRUS 2 (TAT 6-24 HRS) Nasopharyngeal Nasopharyngeal Swab     Status: None   Collection Time: 04/06/19 11:32 PM   Specimen: Nasopharyngeal Swab  Result Value Ref Range Status   SARS Coronavirus 2 NEGATIVE NEGATIVE Final    Comment: (NOTE) SARS-CoV-2 target nucleic acids are NOT DETECTED. The SARS-CoV-2 RNA is generally detectable in upper and lower respiratory specimens during the acute phase of infection. Negative results do not preclude SARS-CoV-2 infection, do not rule out co-infections with other pathogens, and should not be used as the sole basis for treatment or other patient management decisions. Negative results must be combined with clinical observations, patient history, and epidemiological information. The expected result is Negative. Fact Sheet for Patients: SugarRoll.be Fact Sheet for Healthcare Providers: https://www.woods-mathews.com/ This test is not yet approved or cleared by the [place] FDA and  has been authorized for detection and/or diagnosis of SARS-CoV-2 by FDA under an Emergency Use Authorization (EUA). This EUA will remain  in effect (meaning this test can be used) for the duration of the COVID-19 declaration under Section 56 4(b)(1) of  the Act, 21 U.S.C. section 360bbb-3(b)(1), unless the authorization is terminated or revoked sooner. Performed at [facility], [place] [address], [place], [place] [address]     Coagulation Studies: Recent Labs    04/06/19 04-12-2012  LABPROT 24.7*  INR 2.2*    Urinalysis: Recent Labs    04/06/19 2200  COLORURINE AMBER*  LABSPEC 1.014  PHURINE 5.0  GLUCOSEU NEGATIVE  HGBUR SMALL*  BILIRUBINUR NEGATIVE  KETONESUR NEGATIVE  PROTEINUR 30*  NITRITE NEGATIVE  LEUKOCYTESUR TRACE*      Imaging: DG Chest 2 View  Result Date: 04/06/2019 CLINICAL DATA:  Worsening renal function. EXAM: CHEST - 2 VIEW COMPARISON:  Chest x-ray 03/26/2019, 04/20/2017.  CT 03/26/2019. FINDINGS: Left PICC line noted with tip over superior vena cava. Cardiomegaly with diffuse bilateral interstitial prominence, improved from prior studies. Findings suggest improving interstitial edema. Continued follow-up exam suggested. IMPRESSION: Left PICC line noted with tip over superior vena cava. 2. Cardiomegaly with diffuse  bilateral tissue prominence, improved from prior studies. Findings suggest improving interstitial edema. Electronically Signed   By: Marcello Moores  Register   On: 04/06/2019 15:22   US RENAL  Result Date: 04/07/2019 CLINICAL DATA:  Acute renal failure EXAM: RENAL / URINARY TRACT ULTRASOUND COMPLETE COMPARISON:  None. FINDINGS: Right Kidney: Renal measurements: 10.2 x 3.9 x 3.5 cm. = volume: 72 mL. Mild increased echogenicity is noted. 9 mm cyst is noted within the kidney in the lower pole. Left Kidney: Renal measurements: 10.1 x 4.3 x 3.9 cm. = volume: 88.4 mL. Mild increased echogenicity is noted. No mass lesion or hydronephrosis is noted. Bladder: Appears normal for degree of bladder distention. Other: Gallstones are seen.  Small effusions are noted bilaterally. IMPRESSION: Mild increased echogenicity within the kidneys bilaterally consistent with medical renal disease. Small right renal cyst. Cholelithiasis.  Bilateral pleural effusions. Electronically Signed   By: Inez Catalina M.D.   On: 04/07/2019 00:59      Assessment & Plan: Pt is a 85 y.o. female with a PMHx of COPD, interstitial lung disease with chronic hypoxic respiratory failure on home oxygen, coronary disease status post PCI, paroxysmal atrial fibrillation, diastolic heart failure, history of CVA, diabetes mellitus type 2, hypertension, hyperlipidemia, seizure disorder, thrombocytopenia, depression, who was admitted to [facility] on 04/06/2019 for evaluation of acute kidney injury in the setting of recent MRSA bacteremia and pneumonia.  1.  Acute kidney injury.  Baseline creatinine 0.8.  Acute kidney injury now multifactorial with contributions from elevated vancomycin level, poor p.o. intake, and ibuprofen use.  Admission creatinine was 3.07.  Creatinine now down to 2.79.  Renal ultrasound unremarkable for hydronephrosis.  Continue IV fluid hydration at this time.  No acute indication for dialysis treatment.  Avoid nephrotoxins as possible.  2.  Hyperkalemia.  Hyperkalemia appears to be mild.  Serum potassium 5.2.  Hold off on [place] or [place] for now.  Repeat serum potassium tomorrow.  3.  Anemia unspecified.  Hemoglobin low at 7.7.  MCV elevated.  Check serum iron studies, B12, SPEP, UPEP.  4.  Thanks for consultation.

## 2019-04-07 NOTE — ED Notes (Signed)
[place] in to do bedside renal study.

## 2019-04-07 NOTE — ED Notes (Signed)
Report called to Otila Kluver RN pt will be admitted to [facility] after [place].

## 2019-04-07 NOTE — Progress Notes (Signed)
ANTICOAGULATION CONSULT NOTE - Initial Consult  Pharmacy Consult for Heparin  Indication: atrial fibrillation  Allergies  Allergen Reactions  . Doxycycline Diarrhea and Nausea Only  . Methotrexate Rash    Patient Measurements: Height: 5\' 5"  (165.1 cm) Weight: 99.9 kg (220 lb 3.2 oz) IBW/kg (Calculated) : 57 Heparin Dosing Weight: 73.7 kg   Vital Signs: Temp: 96.5 F (35.8 C) (04/02 0938) Temp Source: Oral (04/02 0938) BP: 92/57 (04/02 0938) Pulse Rate: 81 (04/02 0938)  Labs: Recent Labs    04/06/19 1533 04/06/19 2014 04/07/19 0445  HGB 8.2*  --  7.7*  HCT 25.3*  --  24.2*  PLT 121*  --  100*  APTT  --  98*  --   LABPROT  --  24.7*  --   INR  --  2.2*  --   CREATININE 3.07*  --  2.79*    Estimated Creatinine Clearance: 18.2 mL/min (A) (by C-G formula based on SCr of 2.79 mg/dL (H)).   Medical History: Past Medical History:  Diagnosis Date  . Acute bronchitis   . Allergic rhinitis   . Arthritis   . CAD (coronary artery disease)    s/p stent LAD 8/03 and stent placement OM1  . Cancer ([place])    skin  . Chronic back pain   . COPD with asthma ([place])   . CVA (cerebral vascular accident) ([place])   . Diabetes mellitus ([place])   . Hypercholesterolemia   . Hypertension   . Seizure disorder ([place])     Medications:  Medications Prior to Admission  Medication Sig Dispense Refill Last Dose  . albuterol (PROVENTIL) (2.5 MG/3ML) 0.083% nebulizer solution USE ONE VIAL VIA NEBULZIER EVERY FOUR HOURS AS NEEDED FOR WHEEZING 540 mL 1 Unknown at PRN  . albuterol (VENTOLIN HFA) 108 (90 Base) MCG/ACT inhaler USE 2 PUFFS EVERY 4 HOURS AS DIRECTED - RESCUE (Patient taking differently: Inhale 2 puffs into the lungs every 4 (four) hours as needed for wheezing or shortness of breath. ) 18 g 1 Unknown at PRN  . budesonide-formoterol (SYMBICORT) 80-4.5 MCG/ACT inhaler INHALE 2 PUFFS TWICE A DAY (Patient taking differently: Inhale 2 puffs into the lungs 2 (two) times daily. ) 10.2 g 3  04/06/2019 at 1100  . dabigatran (PRADAXA) 150 MG CAPS Take 150 mg by mouth every 12 (twelve) hours.   04/06/2019 at 1100  . gabapentin (NEURONTIN) 100 MG capsule TAKE ONE CAPSULE BY MOUTH TWICE A DAY (Patient taking differently: Take 200 mg by mouth at bedtime. ) 60 capsule 1 04/05/2019 at 2100  . levETIRAcetam (KEPPRA) 750 MG tablet Take 1 tablet (750 mg total) by mouth 2 (two) times daily. 180 tablet 0 04/06/2019 at 1100  . metoprolol succinate (TOPROL-XL) 25 MG 24 hr tablet Take 1 tablet (25 mg total) by mouth daily. 30 tablet 0 04/06/2019 at 1100  . Omega-3 Fatty Acids (FISH OIL) 1000 MG CAPS Take 1,000 mg by mouth daily.      . phenytoin (DILANTIN) 100 MG ER capsule Take 4 capsules (400 mg total) by mouth at bedtime. 360 capsule 0 04/05/2019 at 2100  . polyethylene glycol (MIRALAX / GLYCOLAX) 17 g packet Take 17 g by mouth daily as needed for mild constipation or moderate constipation.    Unknown at PRN  . potassium chloride (KLOR-CON) 10 MEQ tablet Take 1 tablet (10 mEq total) by mouth daily. 90 tablet 1 04/06/2019 at 1100  . rosuvastatin (CRESTOR) 20 MG tablet TAKE 1 TABLET BY MOUTH DAILY (Patient taking differently:  Take 20 mg by mouth daily. ) 30 tablet 5 04/05/2019 at Unknown time  . sertraline (ZOLOFT) 50 MG tablet Take 75 mg by mouth at bedtime.    04/05/2019 at 2100  . vancomycin IVPB Inject 750 mg into the vein daily for 21 days. 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 Labs - Thursday:  BMP, CRP and vancomycin trough 21 Units 0     Assessment: Pharmacy consulted to dose heparin for Afib in this 85 year old female admitted with ARF.  Pt was on Pradaxa 150 mg PO BID at home, last dose was on 4/1 @ 1100.  CrC = 14.7 ml/min   Goal of Therapy:  Heparin level 0.3-0.7 units/ml Monitor platelets by anticoagulation protocol: Yes   Plan:  Heparin 4000 units IV X 1 ordered for 4/2 @ 1100  followed by heparin infusion to start @ 1200 units/hr on 4/2 @ 1100 (24 hrs after last dose of Pradaxa per protocol). Will check HL 8 hrs after start of drip.   Update @ 1126: D/w attending about thrombocytopenia and decrease in hemoglobin. Will defer giving heparin bolus and continue heparin infusion as previously ordered. Per Judson Roch (RN), no concerns for active bleeding. Will recheck HL/CBC in 8 hours.   Rowland Lathe 04/07/2019,11:26 AM

## 2019-04-08 DIAGNOSIS — I9589 Other hypotension: Secondary | ICD-10-CM

## 2019-04-08 DIAGNOSIS — E861 Hypovolemia: Secondary | ICD-10-CM

## 2019-04-08 LAB — BASIC METABOLIC PANEL
Anion gap: 7 (ref 5–15)
BUN: 46 mg/dL — ABNORMAL HIGH (ref 8–23)
CO2: 25 mmol/L (ref 22–32)
Calcium: 7.5 mg/dL — ABNORMAL LOW (ref 8.9–10.3)
Chloride: 103 mmol/L (ref 98–111)
Creatinine, Ser: 2.69 mg/dL — ABNORMAL HIGH (ref 0.44–1.00)
GFR calc Af Amer: 18 mL/min — ABNORMAL LOW (ref >60)
GFR calc non Af Amer: 16 mL/min — ABNORMAL LOW (ref >60)
Glucose, Bld: 103 mg/dL — ABNORMAL HIGH (ref 70–99)
Potassium: 4.9 mmol/L (ref 3.5–5.1)
Sodium: 135 mmol/L (ref 135–145)

## 2019-04-08 LAB — CBC
HCT: 23.4 % — ABNORMAL LOW (ref 36.0–46.0)
Hemoglobin: 7.6 g/dL — ABNORMAL LOW (ref 12.0–15.0)
MCH: 35.3 pg — ABNORMAL HIGH (ref 26.0–34.0)
MCHC: 32.5 g/dL (ref 30.0–36.0)
MCV: 108.8 fL — ABNORMAL HIGH (ref 80.0–100.0)
Platelets: 101 10*3/uL — ABNORMAL LOW (ref 150–400)
RBC: 2.15 MIL/uL — ABNORMAL LOW (ref 3.87–5.11)
RDW: 15.9 % — ABNORMAL HIGH (ref 11.5–15.5)
WBC: 5.8 10*3/uL (ref 4.0–10.5)
nRBC: 0.5 % — ABNORMAL HIGH (ref 0.0–0.2)

## 2019-04-08 LAB — PHENYTOIN LEVEL, TOTAL: Phenytoin Lvl: 14.6 ug/mL (ref 10.0–20.0)

## 2019-04-08 LAB — HEPARIN LEVEL (UNFRACTIONATED)
Heparin Unfractionated: 0.45 IU/mL (ref 0.30–0.70)
Heparin Unfractionated: 0.54 IU/mL (ref 0.30–0.70)

## 2019-04-08 LAB — UREA NITROGEN, URINE: Urea Nitrogen, Ur: 499 mg/dL

## 2019-04-08 LAB — VANCOMYCIN, RANDOM: Vancomycin Rm: 24

## 2019-04-08 MED ORDER — PHENYTOIN SODIUM EXTENDED 100 MG PO CAPS
300.0000 mg | ORAL_CAPSULE | Freq: Every day | ORAL | Status: DC
  Administered 2019-04-09 – 2019-04-13 (×6): 300 mg via ORAL
  Filled 2019-04-08 (×7): qty 3

## 2019-04-08 MED ORDER — PHENYTOIN 50 MG PO CHEW
50.0000 mg | CHEWABLE_TABLET | Freq: Every day | ORAL | Status: DC
  Administered 2019-04-09 – 2019-04-13 (×5): 50 mg via ORAL
  Filled 2019-04-08 (×8): qty 1

## 2019-04-08 MED ORDER — SODIUM CHLORIDE 0.9 % IV BOLUS
500.0000 mL | Freq: Once | INTRAVENOUS | Status: AC
  Administered 2019-04-08: 500 mL via INTRAVENOUS

## 2019-04-08 NOTE — Progress Notes (Signed)
PROGRESS NOTE                                                                                                                                                                                                             Patient Demographics:    Anadelia Kintz, is a 85 y.o. female, DOB - [birth year], ZOX:[id number]  Admit date - 04/06/2019   Admitting Physician Lenore Cordia, MD  Outpatient Primary MD for the patient is Einar Pheasant, MD  LOS - 2        Brief Narrative 84 year old female with COPD/interstitial lung disease with chronic hypoxic respiratory failure on 2 L home O2, CAD s/p PCI, paroxysmal A. fib on Pradaxa, chronic diastolic CHF, history of CVA, type 2 diabetes mellitus, hypertension, hyperlipidemia, thrombocytopenia, seizure disorder and depression who was recently hospitalized and discharged 1 week back secondary to MRSA bacteremia with pneumonia.  She was started on IV vancomycin and plan to treat for 4-week course through a left arm PICC line. She had follow-up labs done as outpatient and found to have acute kidney injury and was told to hold the antibiotic.  At baseline patient has chronic lower extremity swelling/puffiness. Also following hospital discharge patient had an episode of nausea with vomiting and poor p.o. intake.  She was taking ibuprofen as needed for pain for almost 3 times a day recently. Daughter noticed that patient has been increasingly fatigued and sleepy last few days with some confusion.   In the ED vitals were stable.  Blood work showed normal WBC with hemoglobin of 8.2, platelets 120 1K, sodium 131, BUN of 46 and creatinine of 3.07 (worsened from baseline normal of < 1).  Random vancomycin level of 22. Chest x-ray without infiltrate and normal positioning of the PICC line. Admitted for further management of her ATN.   Subjective:   Patient more awake and communicative.  Denies any  pain or discomfort.  Noted for blood pressures to be soft this morning and ordered 500 cc normal saline bolus.  Assessment  & Plan :    Principal Problem: Acute tubular necrosis Suspect vancomycin nephrotoxicity + prerenal ATN...  Also taking NSAIDs at home.  UA unremarkable.  FENa of 0.3%.  Renal function slowly improving since admission. Continue strict I's/O.  Pt is incontinent. She has  a purewick but given body habitus urine tends  to leak and unable to monitor urine output accurately. Hold IV vancomycin.  Levels of 24.   avoid NSAIDs.  Holding home diuretic. Receiving intermittent IV normal saline due to soft blood pressure.    Active Problems: MRSA bacteremia Recent hospitalization with plan on 4 weeks IV vancomycin.  TEE was negative for vegetation.  Random vancomycin level of 24 today.  Vancomycin on hold.  ID recommends that since her levels are therapeutic does not need further antibiotic for now.  Check levels every day until 15 and can start daptomycin to complete the course.   Hyperkalemia Suspect related to AKI/NSAID use.  Improved without intervention.   Chronic diastolic CHF Recent EF of 55-60% on TEE.  Appears euvolemic and has chronic lower extremity edema.  Holding Lasix given ATN.  Monitor I's/O and daily weight.  CAD with history of PCI On Toprol.  Reduce dose of statin given AKI.  No chest pain symptoms.  Macrocytic anemia/thrombocytopenia Hemoglobin stable with slightly dropped platelets.  Monitor while on IV heparin.  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  Hematology planning on bone marrow biopsy as outpatient.  Paroxysmal A. fib Rate controlled.  Continue Toprol once blood pressure stable..  Pradaxa held due to AKI and started on heparin drip.  Essential hypertension Patient hypotensive and metoprolol held  Seizure disorder Continue phenytoin.  Keppra dose reduced given AKI.  Diabetes  mellitus type 2, diet controlled Stable.   Remains inpatient given persistent ATN and hypotension.  Code Status : Full code  Family Communication  : We will update daughter on the phone.  Disposition Plan  : Home pending clinical improvement, active ATN   Barriers For Discharge : Active symptoms, acute kidney injury  Consults  : Renal and ID  Procedures  : Renal ultrasound  DVT Prophylaxis  : IV heparin  Lab Results  Component Value Date   PLT 101 (L) 04/08/2019    Antibiotics   Anti-infectives (From admission, onward)   None        Objective:   Vitals:   04/08/19 0500 04/08/19 0820 04/08/19 0824 04/08/19 0841  BP:   (!) 86/63 (!) 96/43  Pulse:  88 100 65  Resp:  20    Temp:  98 F (36.7 C)    TempSrc:  Oral    SpO2:  100%    Weight: 99.2 kg     Height:        Wt Readings from Last 3 Encounters:  04/08/19 99.2 kg  01/26/19 104.3 kg  11/08/18 104.3 kg     Intake/Output Summary (Last 24 hours) at 04/08/2019 1254 Last data filed at 04/07/2019 1300 Gross per 24 hour  Intake 120 ml  Output --  Net 120 ml    Physical exam Not in distress, awake and alert HEENT: Moist mucosa, supple neck Chest: Clear CVs: Normal S1-S2 GI: Soft, nondistended, nontender Musculoskeletal: Warm, no edema, puffy extremities      Data Review:    CBC Recent Labs  Lab 04/06/19 1533 04/07/19 0445 04/07/19 1721 04/08/19 0529  WBC 7.1 5.7 5.5 5.8  HGB 8.2* 7.7* 8.3* 7.6*  HCT 25.3* 24.2* 26.1* 23.4*  PLT 121* 100* 102* 101*  MCV 106.8* 109.0* 109.7* 108.8*  MCH 34.6* 34.7* 34.9* 35.3*  MCHC 32.4 31.8 31.8 32.5  RDW 16.0* 15.7* 15.9* 15.9*    Chemistries  Recent Labs  Lab 04/06/19 1533 04/07/19 0445 04/08/19 0529  NA 131* 135 135  K  4.9 5.2* 4.9  CL 99 102 103  CO2 27 27 25   GLUCOSE 133* 104* 103*  BUN 46* 47* 46*  CREATININE 3.07* 2.79* 2.69*  CALCIUM 7.4* 7.4* 7.5*  MG  --  1.8  --   AST 43*  --   --   ALT 14  --   --   ALKPHOS 103  --   --    BILITOT 1.4*  --   --    ------------------------------------------------------------------------------------------------------------------ No results for input(s): CHOL, HDL, LDLCALC, TRIG, CHOLHDL, LDLDIRECT in the last 72 hours.  Lab Results  Component Value Date   HGBA1C 4.8 01/19/2019   ------------------------------------------------------------------------------------------------------------------ No results for input(s): TSH, T4TOTAL, T3FREE, THYROIDAB in the last 72 hours.  Invalid input(s): FREET3 ------------------------------------------------------------------------------------------------------------------ Recent Labs    04/07/19 0445  VITAMINB12 845  FERRITIN 305  TIBC 125*  IRON 76    Coagulation profile Recent Labs  Lab 04/06/19 2014  INR 2.2*    No results for input(s): DDIMER in the last 72 hours.  Cardiac Enzymes No results for input(s): CKMB, TROPONINI, MYOGLOBIN in the last 168 hours.  Invalid input(s): CK ------------------------------------------------------------------------------------------------------------------    Component Value Date/Time   BNP 251.0 (H) 04/06/2019 1533    Inpatient Medications  Scheduled Meds: . Chlorhexidine Gluconate Cloth  6 each Topical Q0600  . levETIRAcetam  500 mg Oral BID  . metoprolol succinate  25 mg Oral Daily  . mometasone-formoterol  2 puff Inhalation BID  . phenytoin  400 mg Oral QHS  . rosuvastatin  10 mg Oral Daily  . sertraline  75 mg Oral QHS  . sodium chloride flush  3 mL Intravenous Q12H   Continuous Infusions: . heparin 1,450 Units/hr (04/08/19 0441)   PRN Meds:.acetaminophen **OR** acetaminophen, albuterol, ondansetron **OR** ondansetron (ZOFRAN) IV  Micro Results Recent Results (from the past 240 hour(s))  SARS CORONAVIRUS 2 (TAT 6-24 HRS) Nasopharyngeal Nasopharyngeal Swab     Status: None   Collection Time: 04/06/19 11:32 PM   Specimen: Nasopharyngeal Swab  Result Value Ref  Range Status   SARS Coronavirus 2 NEGATIVE NEGATIVE Final    Comment: (NOTE) SARS-CoV-2 target nucleic acids are NOT DETECTED. The SARS-CoV-2 RNA is generally detectable in upper and lower respiratory specimens during the acute phase of infection. Negative results do not preclude SARS-CoV-2 infection, do not rule out co-infections with other pathogens, and should not be used as the sole basis for treatment or other patient management decisions. Negative results must be combined with clinical observations, patient history, and epidemiological information. The expected result is Negative. Fact Sheet for Patients: SugarRoll.be Fact Sheet for Healthcare Providers: https://www.woods-mathews.com/ This test is not yet approved or cleared by the [place] FDA and  has been authorized for detection and/or diagnosis of SARS-CoV-2 by FDA under an Emergency Use Authorization (EUA). This EUA will remain  in effect (meaning this test can be used) for the duration of the COVID-19 declaration under Section 56 4(b)(1) of the Act, 21 U.S.C. section 360bbb-3(b)(1), unless the authorization is terminated or revoked sooner. Performed at [facility], [place] [address], [place], [place] [address]   CULTURE, BLOOD (ROUTINE X 2) w Reflex to ID Panel     Status: None (Preliminary result)   Collection Time: 04/07/19  3:54 PM   Specimen: BLOOD  Result Value Ref Range Status   Specimen Description BLOOD BLOOD RIGHT HAND  Final   Special Requests AEROBIC BOTTLE ONLY Blood Culture adequate volume  Final   Culture  Final    NO GROWTH < 24 HOURS Performed at [facility], [address] [place], [place] [address]    Report Status PENDING  Incomplete  CULTURE, BLOOD (ROUTINE X 2) w Reflex to ID Panel     Status: None (Preliminary result)   Collection Time: 04/07/19  5:21 PM   Specimen: BLOOD  Result Value Ref Range Status   Specimen Description BLOOD  BLOOD RIGHT HAND  Final   Special Requests   Final    BOTTLES DRAWN AEROBIC AND ANAEROBIC Blood Culture adequate volume   Culture   Final    NO GROWTH < 24 HOURS Performed at [facility], [address], [place], [place] [address]    Report Status PENDING  Incomplete    Radiology Reports DG Chest 2 View  Result Date: 04/06/2019 CLINICAL DATA:  Worsening renal function. EXAM: CHEST - 2 VIEW COMPARISON:  Chest x-ray 03/26/2019, 04/20/2017.  CT 03/26/2019. FINDINGS: Left PICC line noted with tip over superior vena cava. Cardiomegaly with diffuse bilateral interstitial prominence, improved from prior studies. Findings suggest improving interstitial edema. Continued follow-up exam suggested. IMPRESSION: Left PICC line noted with tip over superior vena cava. 2. Cardiomegaly with diffuse bilateral tissue prominence, improved from prior studies. Findings suggest improving interstitial edema. Electronically Signed   By: Marcello Moores  Register   On: 04/06/2019 15:22   CT HEAD WO CONTRAST  Result Date: 03/19/2019 CLINICAL DATA:  85 year old female with fall. EXAM: CT HEAD WITHOUT CONTRAST TECHNIQUE: Contiguous axial images were obtained from the base of the skull through the vertex without intravenous contrast. COMPARISON:  Head CT dated 09/08/2018. FINDINGS: Brain: There is moderate age-related atrophy and chronic microvascular ischemic changes. There is no acute intracranial hemorrhage. No mass effect or midline shift. No extra-axial fluid collection. Vascular: No hyperdense vessel or unexpected calcification. Skull: Normal. Negative for fracture or focal lesion. Sinuses/Orbits: There is opacification of the left frontal sinuses and maxillary sinuses with remodeling of the medial wall of the right maxillary sinus. Opacification of the left sphenoid sinuses. No air-fluid level. The mastoid air cells are clear. Other: None IMPRESSION: 1. No acute intracranial pathology. 2. Age-related atrophy and chronic  microvascular ischemic changes. 3. Chronic paranasal sinus disease. Electronically Signed   By: Anner Crete M.D.   On: 03/19/2019 22:31   CT ANGIO CHEST PE W OR WO CONTRAST  Result Date: 03/26/2019 CLINICAL DATA:  Shortness of breath. Multiple medical problems. EXAM: CT ANGIOGRAPHY CHEST WITH CONTRAST TECHNIQUE: Multidetector CT imaging of the chest was performed using the standard protocol during bolus administration of intravenous contrast. Multiplanar CT image reconstructions and MIPs were obtained to evaluate the vascular anatomy. CONTRAST:  68m OMNIPAQUE IOHEXOL 350 MG/ML SOLN COMPARISON:  01/26/2019 FINDINGS: Cardiovascular: Heart is markedly enlarged and stable in configuration. There is dense atherosclerotic calcification of the coronary arteries. Atherosclerosis of the thoracic aorta is not associated with aneurysm. Pulmonary arteries are well opacified and there is no evidence for acute pulmonary embolus. Mediastinum/Nodes: The visualized portion of the thyroid gland has a normal appearance. Esophagus is unremarkable. Numerous small mediastinal and hilar lymph nodes. Axillary regions are unremarkable. Lungs/Pleura: Moderate bilateral pleural effusions, RIGHT greater than LEFT. Dependent atelectasis/consolidation in the posterior LOWER lobes bilaterally. There are focal and confluent ground-glass opacities throughout the lungs, favoring pulmonary edema over infectious process. Upper Abdomen: Cirrhotic morphology of the liver. Spleen appears enlarged. Dilated inferior vena cava consistent with RIGHT heart failure. Musculoskeletal: No chest wall abnormality. No acute or significant osseous findings.  Review of the MIP images confirms the above findings. IMPRESSION: 1. Technically adequate exam showing no acute pulmonary embolus. 2. Cardiomegaly and changes of RIGHT heart failure. 3. Moderate bilateral pleural effusions, RIGHT greater than LEFT. 4. Focal and confluent ground-glass opacities throughout  the lungs, favoring pulmonary edema over infectious process. 5. Cirrhotic morphology of the liver. 6. Splenomegaly. 7. Aortic Atherosclerosis (ICD10-I70.0). Electronically Signed   By: Nolon Nations M.D.   On: 03/26/2019 12:23   CT CERVICAL SPINE WO CONTRAST  Result Date: 03/23/2019 CLINICAL DATA:  Sepsis and lower extremity edema EXAM: CT CERVICAL SPINE WITHOUT CONTRAST TECHNIQUE: Multidetector CT imaging of the cervical spine was performed without intravenous contrast. Multiplanar CT image reconstructions were also generated. COMPARISON:  09/08/2018 FINDINGS: Alignment: Grade 1 anterolisthesis at C3-4 and C4-5. Reversal of normal cervical lordosis may be positional or due to muscle spasm. Skull base and vertebrae: No acute fracture. Vertebral body heights are maintained. Soft tissues and spinal canal: No prevertebral fluid or swelling. No visible canal hematoma. Disc levels:  No bony spinal canal stenosis. Upper chest: Bilateral pleural effusions. Other: None IMPRESSION: 1. No acute fracture or static subluxation of the cervical spine. 2. Bilateral pleural effusions. Electronically Signed   By: Ulyses Jarred M.D.   On: 03/23/2019 20:55   CT LUMBAR SPINE WO CONTRAST  Result Date: 03/23/2019 CLINICAL DATA:  Sepsis EXAM: CT LUMBAR SPINE WITHOUT CONTRAST TECHNIQUE: Multidetector CT imaging of the lumbar spine was performed without intravenous contrast administration. Multiplanar CT image reconstructions were also generated. COMPARISON:  CT myelogram 09/10/2004 FINDINGS: Segmentation: Normal Alignment: Grade 1 retrolisthesis at L2-3 and L3-4. Vertebrae: Unchanged chronic compression deformity of L2. L4-S1 posterior instrumented fusion. Paraspinal and other soft tissues: Calcific aortic atherosclerosis and bilateral pleural effusions. Horseshoe kidney. Disc levels: L2-3: Mild spinal canal stenosis due to combination of endplate spurring and disc bulge. Moderate facet hypertrophy. Neural foramina are patent.  L3-4: Mild spinal canal stenosis due to disc bulge and facet hypertrophy. No stenosis. L4-5: Status post right laminectomy. No spinal canal or neural foraminal stenosis. L5-S1: Posterior decompression. No spinal canal stenosis. Moderate right and mild left neural foraminal stenosis, unchanged. IMPRESSION: 1. No acute fracture or static subluxation of the lumbar spine. Status post L4-S1 posterior instrumented fusion. No hardware adverse features. 2. Unchanged moderate right and mild left L5-S1 neural foraminal stenosis. 3. Mild L2-3 and L3-4 spinal canal stenosis. 4. Aortic Atherosclerosis (ICD10-I70.0). Electronically Signed   By: Ulyses Jarred M.D.   On: 03/23/2019 21:10   US RENAL  Result Date: 04/07/2019 CLINICAL DATA:  Acute renal failure EXAM: RENAL / URINARY TRACT ULTRASOUND COMPLETE COMPARISON:  None. FINDINGS: Right Kidney: Renal measurements: 10.2 x 3.9 x 3.5 cm. = volume: 72 mL. Mild increased echogenicity is noted. 9 mm cyst is noted within the kidney in the lower pole. Left Kidney: Renal measurements: 10.1 x 4.3 x 3.9 cm. = volume: 88.4 mL. Mild increased echogenicity is noted. No mass lesion or hydronephrosis is noted. Bladder: Appears normal for degree of bladder distention. Other: Gallstones are seen.  Small effusions are noted bilaterally. IMPRESSION: Mild increased echogenicity within the kidneys bilaterally consistent with medical renal disease. Small right renal cyst. Cholelithiasis. Bilateral pleural effusions. Electronically Signed   By: Inez Catalina M.D.   On: 04/07/2019 00:59   DG Chest Port 1 View  Result Date: 03/26/2019 CLINICAL DATA:  Shortness of breath and chest pain. EXAM: PORTABLE CHEST 1 VIEW COMPARISON:  March 25, 2019 FINDINGS: Stable cardiomegaly. Diffuse interstitial opacity on the  left and patchy infiltrate on the right are stable. No interval changes. IMPRESSION: No interval change in bilateral pulmonary opacities. Electronically Signed   By: Dorise Bullion III M.D   On:  03/26/2019 10:28   DG Chest Port 1 View  Result Date: 03/25/2019 CLINICAL DATA:  Community acquired pneumonia. Patient states having a cough and some sob today. Hx of bronchitis, CAD, COPD. Never smoker. EXAM: PORTABLE CHEST 1 VIEW COMPARISON:  Chest radiograph 03/22/2019 FINDINGS: Stable position of a left upper extremity PICC. Unchanged cardiomediastinal contours. There are diffuse bilateral interstitial and airspace opacities, not significantly changed. Probable small bilateral effusions. No pneumothorax. No acute finding in the visualized skeleton. IMPRESSION: Stable diffuse bilateral interstitial and airspace opacities. Electronically Signed   By: Audie Pinto M.D.   On: 03/25/2019 17:53   DG Chest Port 1 View  Result Date: 03/22/2019 CLINICAL DATA:  PICC placement EXAM: PORTABLE CHEST 1 VIEW COMPARISON:  03/18/2019 chest radiograph. FINDINGS: Left PICC terminates at the cavoatrial junction. Stable cardiomediastinal silhouette with mild cardiomegaly. No pneumothorax. Small bilateral pleural effusions, stable. Patchy hazy and linear interstitial opacities throughout both lungs, similar. IMPRESSION: 1. Left PICC terminates at the cavoatrial junction. 2. Stable small bilateral pleural effusions. 3. Mild cardiomegaly. Patchy hazy and linear interstitial opacities throughout both lungs, similar, either pulmonary edema or atypical pneumonia. Electronically Signed   By: Ilona Sorrel M.D.   On: 03/22/2019 18:17   DG Chest Port 1 View  Result Date: 03/18/2019 CLINICAL DATA:  85 year old female with sepsis. EXAM: PORTABLE CHEST 1 VIEW COMPARISON:  Chest radiograph dated 04/20/2017. FINDINGS: There is chronic interstitial coarsening and bronchitic changes. Overall slight interval progression of interstitial densities since the prior radiograph which may represent progression of background of lung disease although superimposed pneumonia is not excluded. Clinical correlation is recommended. No focal  consolidation or pneumothorax. Trace bilateral pleural effusions may be present. There is mild cardiomegaly. Atherosclerotic calcification of the aorta. No acute osseous pathology. IMPRESSION: Slight interval progression of interstitial densities may represent progression of lung disease versus superimposed pneumonia. Electronically Signed   By: Anner Crete M.D.   On: 03/18/2019 23:43   ECHOCARDIOGRAM COMPLETE  Result Date: 03/20/2019    ECHOCARDIOGRAM REPORT   Patient Name:   RHIANON ZABAWA Date of Exam: 03/20/2019 Medical Rec #:  [id number]        Height:       66.0 in Accession #:    [id number]       Weight:       162.0 lb Date of Birth:  1934-11-03        BSA:          1.828 m Patient Age:    89 years         BP:           57/93 mmHg Patient Gender: F                HR:           114 bpm. Exam Location:  [facility] Procedure: 2D Echo, Cardiac Doppler and Color Doppler Indications:     Dyspnea 786.09  History:         Patient has no prior history of Echocardiogram examinations.                  CAD, COPD; Risk Factors:Hypertension. CVA.  Sonographer:     Sherrie Sport RDCS (AE) Referring Phys:  [id number] [facility] AMIN Diagnosing Phys: Bartholome Bill MD IMPRESSIONS  1.  Left ventricular ejection fraction, by estimation, is 70 to 75%. Left ventricular ejection fraction by PLAX is 75 %. The left ventricle has hyperdynamic function. The left ventricle has no regional wall motion abnormalities. Left ventricular diastolic parameters are consistent with Grade I diastolic dysfunction (impaired relaxation).  2. Right ventricular systolic function is normal. The right ventricular size is mildly enlarged. There is moderately elevated pulmonary artery systolic pressure.  3. Left atrial size was mildly dilated.  4. Right atrial size was mildly dilated.  5. The mitral valve is grossly normal. Mild mitral valve regurgitation.  6. The aortic valve is grossly normal. Aortic valve regurgitation is trivial. FINDINGS  Left Ventricle: Left  ventricular ejection fraction, by estimation, is 70 to 75%. Left ventricular ejection fraction by PLAX is 75 % The left ventricle has hyperdynamic function. The left ventricle has no regional wall motion abnormalities. The left ventricular internal cavity size was normal in size. There is borderline left ventricular hypertrophy. Left ventricular diastolic parameters are consistent with Grade I diastolic dysfunction (impaired relaxation). Right Ventricle: The right ventricular size is mildly enlarged. No increase in right ventricular wall thickness. Right ventricular systolic function is normal. There is moderately elevated pulmonary artery systolic pressure. The tricuspid regurgitant velocity is 3.27 m/s, and with an assumed right atrial pressure of 10 mmHg, the estimated right ventricular systolic pressure is 38.1 mmHg. Left Atrium: Left atrial size was mildly dilated. Right Atrium: Right atrial size was mildly dilated. Pericardium: There is no evidence of pericardial effusion. Mitral Valve: The mitral valve is grossly normal. Mild mitral valve regurgitation. Tricuspid Valve: The tricuspid valve is grossly normal. Tricuspid valve regurgitation is mild. Aortic Valve: The aortic valve is grossly normal. Aortic valve regurgitation is trivial. Aortic valve mean gradient measures 3.5 mmHg. Aortic valve peak gradient measures 7.2 mmHg. Aortic valve area, by VTI measures 2.48 cm. Pulmonic Valve: The pulmonic valve was grossly normal. Pulmonic valve regurgitation is mild. Aorta: The aortic root was not well visualized. IAS/Shunts: The atrial septum is grossly normal.  LEFT VENTRICLE PLAX 2D LV EF:         Left ventricular ejection fraction by PLAX is 75 % LVIDd:         3.69 cm LVIDs:         2.09 cm LV PW:         1.35 cm LV IVS:        0.78 cm LVOT diam:     2.00 cm LV SV:         58 LV SV Index:   32 LVOT Area:     3.14 cm  RIGHT VENTRICLE RV Basal diam:  4.34 cm LEFT ATRIUM            Index       RIGHT ATRIUM            Index LA diam:      4.70 cm  2.57 cm/m  RA Area:     24.40 cm LA Vol (A2C): 158.0 ml 86.41 ml/m RA Volume:   87.20 ml  47.69 ml/m LA Vol (A4C): 52.8 ml  28.88 ml/m  AORTIC VALVE                   PULMONIC VALVE AV Area (Vmax):    2.20 cm    PV Vmax:        0.71 m/s AV Area (Vmean):   2.43 cm    PV Peak grad:   2.0 mmHg AV  Area (VTI):     2.48 cm    RVOT Peak grad: 2 mmHg AV Vmax:           134.00 cm/s AV Vmean:          87.550 cm/s AV VTI:            0.234 m AV Peak Grad:      7.2 mmHg AV Mean Grad:      3.5 mmHg LVOT Vmax:         93.80 cm/s LVOT Vmean:        67.600 cm/s LVOT VTI:          0.185 m LVOT/AV VTI ratio: 0.79  AORTA Ao Root diam: 3.10 cm MITRAL VALVE                TRICUSPID VALVE MV Area (PHT): 4.89 cm     TR Peak grad:   42.8 mmHg MV Decel Time: 155 msec     TR Vmax:        327.00 cm/s MV E velocity: 127.00 cm/s MV A velocity: 128.00 cm/s  SHUNTS MV E/A ratio:  0.99         Systemic VTI:  0.18 m                             Systemic Diam: 2.00 cm Bartholome Bill MD Electronically signed by Bartholome Bill MD Signature Date/Time: 03/20/2019/4:00:44 PM    Final    ECHO TEE  Result Date: 03/21/2019    TRANSESOPHOGEAL ECHO REPORT   Patient Name:   VALARIA KOHUT Date of Exam: 03/21/2019 Medical Rec #:  [id number]        Height:       66.0 in Accession #:    [id number]       Weight:       238.5 lb Date of Birth:  1934/12/16        BSA:          2.155 m Patient Age:    58 years         BP:           107/40 mmHg Patient Gender: F                HR:           111 bpm. 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 COPD; Risk Factors:Hypertension and Diabetes.                  CVA.  Sonographer:     Sherrie Sport RDCS (AE) Referring Phys:  [place] Diagnosing Phys: Bartholome Bill MD  PROCEDURE: TEE procedure time was 16 minutes. The transesophogeal probe was passed without difficulty through the esophogus of the patient. Imaged were obtained with the patient in a left lateral decubitus position. Local oropharyngeal anesthetic was provided with viscous lidocaine and Benzocaine spray. Sedation performed by performing physician. Image quality was excellent. The patient's vital signs; including heart rate, blood pressure, and oxygen saturation; remained stable throughout the procedure. The patient developed no complications during the procedure. IMPRESSIONS  1. Left ventricular ejection  fraction, by estimation, is 55 to 60%. The left ventricle has normal function. The left ventricle has no regional wall motion abnormalities.  2. Right ventricular systolic function is normal. The right ventricular size is mildly enlarged.  3. No left atrial/left atrial appendage thrombus was detected.  4. Right atrial size was moderately dilated.  5. The mitral valve is grossly normal. Mild mitral valve regurgitation.  6. Tricuspid valve regurgitation is mild to moderate.  7. The aortic valve is tricuspid. Aortic valve regurgitation is trivial. No aortic stenosis is present. Conclusion(s)/Recommendation(s): No evidence of vegetation/infective endocarditis on this transesophageal echocardiogram. FINDINGS  Left Ventricle: Left ventricular ejection fraction, by estimation, is 55 to 60%. The left ventricle has normal function. The left ventricle has no regional wall motion abnormalities. The left ventricular internal cavity size was normal in size. There is  no left ventricular hypertrophy. Right Ventricle: The right ventricular size is mildly enlarged. No increase in right ventricular wall thickness. Right ventricular systolic function is normal. Left Atrium: Left atrial size was normal in size. No left atrial/left atrial appendage thrombus was detected. Right Atrium: Right atrial size was moderately dilated.  Pericardium: There is no evidence of pericardial effusion. Mitral Valve: The mitral valve is grossly normal. Mild mitral valve regurgitation. There is no evidence of mitral valve vegetation. Tricuspid Valve: The tricuspid valve is grossly normal. Tricuspid valve regurgitation is mild to moderate. Aortic Valve: The aortic valve is tricuspid. Aortic valve regurgitation is trivial. No aortic stenosis is present. There is no evidence of aortic valve vegetation. Pulmonic Valve: The pulmonic valve was not well visualized. Pulmonic valve regurgitation is trivial. Aorta: The aortic root is normal in size and structure. IAS/Shunts: The atrial septum is grossly normal. Bartholome Bill MD Electronically signed by Bartholome Bill MD Signature Date/Time: 03/21/2019/10:50:39 AM    Final    [place] EKG SITE RITE  Result Date: 03/22/2019 If Site Rite image not attached, placement could not be confirmed due to current cardiac rhythm.   Time Spent in minutes 35   Alcario Tinkey M.D on 04/08/2019 at 12:54 PM  Between 7am to 7pm - Pager - [phone]  After 7pm go to [email or web address] - password [facility]  [facility] -  Office  [phone]

## 2019-04-08 NOTE — Progress Notes (Signed)
[place] for Heparin  Indication: atrial fibrillation  Allergies  Allergen Reactions  . Doxycycline Diarrhea and Nausea Only  . Methotrexate Rash    Patient Measurements: Height: 5\' 5"  (165.1 cm) Weight: 99.2 kg (218 lb 11.1 oz) IBW/kg (Calculated) : 57 Heparin Dosing Weight: 73.7 kg   Vital Signs: Temp: 98 F (36.7 C) (04/03 0820) Temp Source: Oral (04/03 0820) BP: 96/43 (04/03 0841) Pulse Rate: 65 (04/03 0841)  Labs: Recent Labs    04/06/19 1533 04/06/19 1533 04/06/19 2014 04/07/19 0445 04/07/19 0445 04/07/19 1721 04/07/19 1954 04/08/19 0529 04/08/19 1326  HGB 8.2*   < >  --  7.7*   < > 8.3*  --  7.6*  --   HCT 25.3*   < >  --  24.2*  --  26.1*  --  23.4*  --   PLT 121*   < >  --  100*  --  102*  --  101*  --   APTT  --   --  98*  --   --   --   --   --   --   LABPROT  --   --  24.7*  --   --   --   --   --   --   INR  --   --  2.2*  --   --   --   --   --   --   HEPARINUNFRC  --   --   --   --   --   --  0.15* 0.45 0.54  CREATININE 3.07*  --   --  2.79*  --   --   --  2.69*  --    < > = values in this interval not displayed.    Estimated Creatinine Clearance: 18.8 mL/min (A) (by C-G formula based on SCr of 2.69 mg/dL (H)).   Medical History: Past Medical History:  Diagnosis Date  . Acute bronchitis   . Allergic rhinitis   . Arthritis   . CAD (coronary artery disease)    s/p stent LAD 8/03 and stent placement OM1  . Cancer ([place])    skin  . Chronic back pain   . COPD with asthma ([place])   . CVA (cerebral vascular accident) ([place])   . Diabetes mellitus ([place])   . Hypercholesterolemia   . Hypertension   . Seizure disorder ([place])     Medications:  Medications Prior to Admission  Medication Sig Dispense Refill Last Dose  . albuterol (PROVENTIL) (2.5 MG/3ML) 0.083% nebulizer solution USE ONE VIAL VIA NEBULZIER EVERY FOUR HOURS AS NEEDED FOR WHEEZING 540 mL 1 Unknown at PRN  . albuterol (VENTOLIN HFA) 108 (90 Base)  MCG/ACT inhaler USE 2 PUFFS EVERY 4 HOURS AS DIRECTED - RESCUE (Patient taking differently: Inhale 2 puffs into the lungs every 4 (four) hours as needed for wheezing or shortness of breath. ) 18 g 1 Unknown at PRN  . budesonide-formoterol (SYMBICORT) 80-4.5 MCG/ACT inhaler INHALE 2 PUFFS TWICE A DAY (Patient taking differently: Inhale 2 puffs into the lungs 2 (two) times daily. ) 10.2 g 3 04/06/2019 at 1100  . dabigatran (PRADAXA) 150 MG CAPS Take 150 mg by mouth every 12 (twelve) hours.   04/06/2019 at 1100  . gabapentin (NEURONTIN) 100 MG capsule TAKE ONE CAPSULE BY MOUTH TWICE A DAY (Patient taking differently: Take 200 mg by mouth at bedtime. ) 60 capsule 1 04/05/2019 at 2100  . levETIRAcetam (  KEPPRA) 750 MG tablet Take 1 tablet (750 mg total) by mouth 2 (two) times daily. 180 tablet 0 04/06/2019 at 1100  . metoprolol succinate (TOPROL-XL) 25 MG 24 hr tablet Take 1 tablet (25 mg total) by mouth daily. 30 tablet 0 04/06/2019 at 1100  . Omega-3 Fatty Acids (FISH OIL) 1000 MG CAPS Take 1,000 mg by mouth daily.      . phenytoin (DILANTIN) 100 MG ER capsule Take 4 capsules (400 mg total) by mouth at bedtime. 360 capsule 0 04/05/2019 at 2100  . polyethylene glycol (MIRALAX / GLYCOLAX) 17 g packet Take 17 g by mouth daily as needed for mild constipation or moderate constipation.    Unknown at PRN  . potassium chloride (KLOR-CON) 10 MEQ tablet Take 1 tablet (10 mEq total) by mouth daily. 90 tablet 1 04/06/2019 at 1100  . rosuvastatin (CRESTOR) 20 MG tablet TAKE 1 TABLET BY MOUTH DAILY (Patient taking differently: Take 20 mg by mouth daily. ) 30 tablet 5 04/05/2019 at Unknown time  . sertraline (ZOLOFT) 50 MG tablet Take 75 mg by mouth at bedtime.    04/05/2019 at 2100  . vancomycin IVPB Inject 750 mg into the vein daily for 21 days. 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 Labs - Thursday:  BMP, CRP and vancomycin  trough 21 Units 0     Assessment: Pharmacy consulted to dose heparin for Afib in this 85 year old female admitted with ARF.  Pt was on Pradaxa 150 mg PO BID at home, last dose was on 4/1 @ 1100.  CrC = 14.7 ml/min   -4/2 @ 1954 HL 0.15, subtherapeutic. Will increase drip rate to 1450 units/hr. No bolus in view of thrombocytopenia.  -Heparin level 8 hours after rate change -Daily CBC per protocol  0403 0529 HL 0.45, therapeutic x 1.  H/H worse, PLTs stable.  Continue Heparin drip at current rate and recheck HL in 8 hours to confirm.  Goal of Therapy:  Heparin level 0.3-0.7 units/ml Monitor platelets by anticoagulation protocol: Yes   Plan:  4/3 @1326  HL= 0.54. will continue with current drip rate of 1450 units/hr. Will f/u HL with am labs. Hgb 7.6  Plt 101  Brooke Baker A  04/08/2019,2:45 PM

## 2019-04-08 NOTE — Progress Notes (Signed)
[facility]  ROUNDING NOTE   Subjective:   Creatinine 2.69 (2.79)  Objective:  Vital signs in last 24 hours:  Temp:  [97.6 F (36.4 C)-98.9 F (37.2 C)] 98.4 F (36.9 C) (04/03 1449) Pulse Rate:  [60-114] 60 (04/03 1449) Resp:  [17-20] 18 (04/03 1449) BP: (86-146)/(25-63) 104/44 (04/03 1449) SpO2:  [96 %-100 %] 96 % (04/03 1449) Weight:  [99.2 kg] 99.2 kg (04/03 0500)  Weight change: 19.8 kg Filed Weights   04/06/19 1514 04/07/19 0452 04/08/19 0500  Weight: 79.4 kg 99.9 kg 99.2 kg    Intake/Output: I/O last 3 completed shifts: In: 240 [P.O.:240] Out: 400 [Urine:400]   Intake/Output this shift:  No intake/output data recorded.  Physical Exam: General: NAD,   Head: Normocephalic, atraumatic. Moist oral mucosal membranes  Eyes: Anicteric, PERRL  Neck: Supple, trachea midline  Lungs:  Clear to auscultation  Heart: Regular rate and rhythm  Abdomen:  Soft, nontender,   Extremities:  + peripheral edema.  Neurologic: Nonfocal, moving all four extremities  Skin: No lesions        Basic Metabolic Panel: Recent Labs  Lab 04/06/19 1533 04/07/19 0445 04/08/19 0529  NA 131* 135 135  K 4.9 5.2* 4.9  CL 99 102 103  CO2 27 27 25   GLUCOSE 133* 104* 103*  BUN 46* 47* 46*  CREATININE 3.07* 2.79* 2.69*  CALCIUM 7.4* 7.4* 7.5*  MG  --  1.8  --   PHOS  --  4.1  --     Liver Function Tests: Recent Labs  Lab 04/06/19 1533  AST 43*  ALT 14  ALKPHOS 103  BILITOT 1.4*  PROT 7.0  ALBUMIN 1.9*   No results for input(s): LIPASE, AMYLASE in the last 168 hours. No results for input(s): AMMONIA in the last 168 hours.  CBC: Recent Labs  Lab 04/06/19 1533 04/07/19 0445 04/07/19 1721 04/08/19 0529  WBC 7.1 5.7 5.5 5.8  HGB 8.2* 7.7* 8.3* 7.6*  HCT 25.3* 24.2* 26.1* 23.4*  MCV 106.8* 109.0* 109.7* 108.8*  PLT 121* 100* 102* 101*    Cardiac Enzymes: No results for input(s): CKTOTAL, CKMB, CKMBINDEX, TROPONINI in the last 168 hours.  BNP: Invalid  input(s): POCBNP  CBG: No results for input(s): GLUCAP in the last 168 hours.  Microbiology: Results for orders placed or performed during the hospital encounter of 04/06/19  SARS CORONAVIRUS 2 (TAT 6-24 HRS) Nasopharyngeal Nasopharyngeal Swab     Status: None   Collection Time: 04/06/19 11:32 PM   Specimen: Nasopharyngeal Swab  Result Value Ref Range Status   SARS Coronavirus 2 NEGATIVE NEGATIVE Final    Comment: (NOTE) SARS-CoV-2 target nucleic acids are NOT DETECTED. The SARS-CoV-2 RNA is generally detectable in upper and lower respiratory specimens during the acute phase of infection. Negative results do not preclude SARS-CoV-2 infection, do not rule out co-infections with other pathogens, and should not be used as the sole basis for treatment or other patient management decisions. Negative results must be combined with clinical observations, patient history, and epidemiological information. The expected result is Negative. Fact Sheet for Patients: SugarRoll.be Fact Sheet for Healthcare Providers: https://www.woods-mathews.com/ This test is not yet approved or cleared by the [place] FDA and  has been authorized for detection and/or diagnosis of SARS-CoV-2 by FDA under an Emergency Use Authorization (EUA). This EUA will remain  in effect (meaning this test can be used) for the duration of the COVID-19 declaration under Section 56 4(b)(1) of the Act, 21 U.S.C. section 360bbb-3(b)(1),  unless the authorization is terminated or revoked sooner. Performed at [facility], [place] [address], [place], [place] [address]   CULTURE, BLOOD (ROUTINE X 2) w Reflex to ID Panel     Status: None (Preliminary result)   Collection Time: 04/07/19  3:54 PM   Specimen: BLOOD  Result Value Ref Range Status   Specimen Description BLOOD BLOOD RIGHT HAND  Final   Special Requests AEROBIC BOTTLE ONLY Blood Culture adequate volume  Final   Culture    Final    NO GROWTH < 24 HOURS Performed at [facility], [address] [place], [place] [address]    Report Status PENDING  Incomplete  CULTURE, BLOOD (ROUTINE X 2) w Reflex to ID Panel     Status: None (Preliminary result)   Collection Time: 04/07/19  5:21 PM   Specimen: BLOOD  Result Value Ref Range Status   Specimen Description BLOOD BLOOD RIGHT HAND  Final   Special Requests   Final    BOTTLES DRAWN AEROBIC AND ANAEROBIC Blood Culture adequate volume   Culture   Final    NO GROWTH < 24 HOURS Performed at [facility], [address], [place], [place] [address]    Report Status PENDING  Incomplete    Coagulation Studies: Recent Labs    04/06/19 2012-04-09  LABPROT 24.7*  INR 2.2*    Urinalysis: Recent Labs    04/06/19 2200  COLORURINE AMBER*  LABSPEC 1.014  PHURINE 5.0  GLUCOSEU NEGATIVE  HGBUR SMALL*  BILIRUBINUR NEGATIVE  KETONESUR NEGATIVE  PROTEINUR 30*  NITRITE NEGATIVE  LEUKOCYTESUR TRACE*      Imaging: US RENAL  Result Date: 04/07/2019 CLINICAL DATA:  Acute renal failure EXAM: RENAL / URINARY TRACT ULTRASOUND COMPLETE COMPARISON:  None. FINDINGS: Right Kidney: Renal measurements: 10.2 x 3.9 x 3.5 cm. = volume: 72 mL. Mild increased echogenicity is noted. 9 mm cyst is noted within the kidney in the lower pole. Left Kidney: Renal measurements: 10.1 x 4.3 x 3.9 cm. = volume: 88.4 mL. Mild increased echogenicity is noted. No mass lesion or hydronephrosis is noted. Bladder: Appears normal for degree of bladder distention. Other: Gallstones are seen.  Small effusions are noted bilaterally. IMPRESSION: Mild increased echogenicity within the kidneys bilaterally consistent with medical renal disease. Small right renal cyst. Cholelithiasis. Bilateral pleural effusions. Electronically Signed   By: Inez Catalina M.D.   On: 04/07/2019 00:59     Medications:   . heparin 1,450 Units/hr (04/08/19 0441)   . Chlorhexidine Gluconate Cloth  6 each Topical  Q0600  . levETIRAcetam  500 mg Oral BID  . metoprolol succinate  25 mg Oral Daily  . mometasone-formoterol  2 puff Inhalation BID  . phenytoin  400 mg Oral QHS  . rosuvastatin  10 mg Oral Daily  . sertraline  75 mg Oral QHS  . sodium chloride flush  3 mL Intravenous Q12H   acetaminophen **OR** acetaminophen, albuterol, ondansetron **OR** ondansetron (ZOFRAN) IV  Assessment/ Plan:  Ms. Brooke Baker is a 85 y.o. white female with COPD, interstitial lung disease, coronary artery disease, atrial fibrillation, diastolic congestive heart failure, CVA, diabetes mellitus type 2, hypertension, hyperlipidemia, seizure disorder, thrombocytopenia, depression, who was admitted to [facility] on 04/06/2019 for Acute renal failure (ARF) ([place]) [N17.9] AKI (acute kidney injury) ([place]) [N17.9]  1.  Acute kidney injury with hyperkalemia.  Baseline creatinine 0.8 with normal range GFR. Acute kidney injury secondary to ATN from vancomycin, ibuprofen and IV contrast exposure on 3/21.  Holding IV fluids at this time. Kidney function is improving.  No indication for dialysis at this time.  Was on potassium chloride on admission  2. Bacteremia: MRSA. Vanc level this morning of 24. Holding vancomycin.  - Appreciate ID input.  3. Hypoalbuminemia: secondary to poor nutritional status. Cause of her peripheral edema.   4. Anemia with renal failure: hemoglobin 7.6, macrocytic. Iron studies, vitamin B12 and folate are at goal. SPEP/UPEP negative from 01/30/19.   5. Hypertension - metoprolol   LOS: 2 Brooke Baker 4/3/20213:28 PM

## 2019-04-08 NOTE — Progress Notes (Signed)
[place] for Heparin  Indication: atrial fibrillation  Allergies  Allergen Reactions  . Doxycycline Diarrhea and Nausea Only  . Methotrexate Rash    Patient Measurements: Height: 5\' 5"  (165.1 cm) Weight: 99.2 kg (218 lb 11.1 oz) IBW/kg (Calculated) : 57 Heparin Dosing Weight: 73.7 kg   Vital Signs: Temp: 98.9 F (37.2 C) (04/03 0155) Temp Source: Oral (04/03 0155) BP: 103/25 (04/03 0428) Pulse Rate: 98 (04/03 0428)  Labs: Recent Labs    04/06/19 1533 04/06/19 1533 04/06/19 2014 04/07/19 0445 04/07/19 0445 04/07/19 1721 04/07/19 1954 04/08/19 0529  HGB 8.2*   < >  --  7.7*   < > 8.3*  --  7.6*  HCT 25.3*   < >  --  24.2*  --  26.1*  --  23.4*  PLT 121*   < >  --  100*  --  102*  --  101*  APTT  --   --  98*  --   --   --   --   --   LABPROT  --   --  24.7*  --   --   --   --   --   INR  --   --  2.2*  --   --   --   --   --   HEPARINUNFRC  --   --   --   --   --   --  0.15* 0.45  CREATININE 3.07*  --   --  2.79*  --   --   --  2.69*   < > = values in this interval not displayed.    Estimated Creatinine Clearance: 18.8 mL/min (A) (by C-G formula based on SCr of 2.69 mg/dL (H)).   Medical History: Past Medical History:  Diagnosis Date  . Acute bronchitis   . Allergic rhinitis   . Arthritis   . CAD (coronary artery disease)    s/p stent LAD 8/03 and stent placement OM1  . Cancer ([place])    skin  . Chronic back pain   . COPD with asthma ([place])   . CVA (cerebral vascular accident) ([place])   . Diabetes mellitus ([place])   . Hypercholesterolemia   . Hypertension   . Seizure disorder ([place])     Medications:  Medications Prior to Admission  Medication Sig Dispense Refill Last Dose  . albuterol (PROVENTIL) (2.5 MG/3ML) 0.083% nebulizer solution USE ONE VIAL VIA NEBULZIER EVERY FOUR HOURS AS NEEDED FOR WHEEZING 540 mL 1 Unknown at PRN  . albuterol (VENTOLIN HFA) 108 (90 Base) MCG/ACT inhaler USE 2 PUFFS EVERY 4 HOURS AS  DIRECTED - RESCUE (Patient taking differently: Inhale 2 puffs into the lungs every 4 (four) hours as needed for wheezing or shortness of breath. ) 18 g 1 Unknown at PRN  . budesonide-formoterol (SYMBICORT) 80-4.5 MCG/ACT inhaler INHALE 2 PUFFS TWICE A DAY (Patient taking differently: Inhale 2 puffs into the lungs 2 (two) times daily. ) 10.2 g 3 04/06/2019 at 1100  . dabigatran (PRADAXA) 150 MG CAPS Take 150 mg by mouth every 12 (twelve) hours.   04/06/2019 at 1100  . gabapentin (NEURONTIN) 100 MG capsule TAKE ONE CAPSULE BY MOUTH TWICE A DAY (Patient taking differently: Take 200 mg by mouth at bedtime. ) 60 capsule 1 04/05/2019 at 2100  . levETIRAcetam (KEPPRA) 750 MG tablet Take 1 tablet (750 mg total) by mouth 2 (two) times daily. 180 tablet 0 04/06/2019 at 1100  .  metoprolol succinate (TOPROL-XL) 25 MG 24 hr tablet Take 1 tablet (25 mg total) by mouth daily. 30 tablet 0 04/06/2019 at 1100  . Omega-3 Fatty Acids (FISH OIL) 1000 MG CAPS Take 1,000 mg by mouth daily.      . phenytoin (DILANTIN) 100 MG ER capsule Take 4 capsules (400 mg total) by mouth at bedtime. 360 capsule 0 04/05/2019 at 2100  . polyethylene glycol (MIRALAX / GLYCOLAX) 17 g packet Take 17 g by mouth daily as needed for mild constipation or moderate constipation.    Unknown at PRN  . potassium chloride (KLOR-CON) 10 MEQ tablet Take 1 tablet (10 mEq total) by mouth daily. 90 tablet 1 04/06/2019 at 1100  . rosuvastatin (CRESTOR) 20 MG tablet TAKE 1 TABLET BY MOUTH DAILY (Patient taking differently: Take 20 mg by mouth daily. ) 30 tablet 5 04/05/2019 at Unknown time  . sertraline (ZOLOFT) 50 MG tablet Take 75 mg by mouth at bedtime.    04/05/2019 at 2100  . vancomycin IVPB Inject 750 mg into the vein daily for 21 days. 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 Labs - Thursday:  BMP, CRP and vancomycin trough 21 Units 0      Assessment: Pharmacy consulted to dose heparin for Afib in this 85 year old female admitted with ARF.  Pt was on Pradaxa 150 mg PO BID at home, last dose was on 4/1 @ 1100.  CrC = 14.7 ml/min   Goal of Therapy:  Heparin level 0.3-0.7 units/ml Monitor platelets by anticoagulation protocol: Yes   Plan:  -4/2 @ 1954 HL 0.15, subtherapeutic. Will increase drip rate to 1450 units/hr. No bolus in view of thrombocytopenia.  -Heparin level 8 hours after rate change -Daily CBC per protocol  0403 0529 HL 0.45, therapeutic x 1.  H/H worse, PLTs stable.  Continue Heparin drip at current rate and recheck HL in 8 hours to confirm.  Hart Robinsons A  04/08/2019,6:26 AM

## 2019-04-09 DIAGNOSIS — I480 Paroxysmal atrial fibrillation: Secondary | ICD-10-CM

## 2019-04-09 LAB — CBC
HCT: 22.5 % — ABNORMAL LOW (ref 36.0–46.0)
Hemoglobin: 7.4 g/dL — ABNORMAL LOW (ref 12.0–15.0)
MCH: 35.6 pg — ABNORMAL HIGH (ref 26.0–34.0)
MCHC: 32.9 g/dL (ref 30.0–36.0)
MCV: 108.2 fL — ABNORMAL HIGH (ref 80.0–100.0)
Platelets: 104 10*3/uL — ABNORMAL LOW (ref 150–400)
RBC: 2.08 MIL/uL — ABNORMAL LOW (ref 3.87–5.11)
RDW: 16.2 % — ABNORMAL HIGH (ref 11.5–15.5)
WBC: 6.2 10*3/uL (ref 4.0–10.5)
nRBC: 0 % (ref 0.0–0.2)

## 2019-04-09 LAB — HEPARIN LEVEL (UNFRACTIONATED)
Heparin Unfractionated: 0.74 IU/mL — ABNORMAL HIGH (ref 0.30–0.70)
Heparin Unfractionated: 0.67 IU/mL (ref 0.30–0.70)

## 2019-04-09 LAB — BASIC METABOLIC PANEL
Anion gap: 5 (ref 5–15)
BUN: 44 mg/dL — ABNORMAL HIGH (ref 8–23)
CO2: 26 mmol/L (ref 22–32)
Calcium: 7.4 mg/dL — ABNORMAL LOW (ref 8.9–10.3)
Chloride: 103 mmol/L (ref 98–111)
Creatinine, Ser: 2.56 mg/dL — ABNORMAL HIGH (ref 0.44–1.00)
GFR calc Af Amer: 20 mL/min — ABNORMAL LOW (ref >60)
GFR calc non Af Amer: 17 mL/min — ABNORMAL LOW (ref >60)
Glucose, Bld: 105 mg/dL — ABNORMAL HIGH (ref 70–99)
Potassium: 4.2 mmol/L (ref 3.5–5.1)
Sodium: 134 mmol/L — ABNORMAL LOW (ref 135–145)

## 2019-04-09 LAB — PREPARE RBC (CROSSMATCH)

## 2019-04-09 LAB — GLUCOSE, CAPILLARY: Glucose-Capillary: 103 mg/dL — ABNORMAL HIGH (ref 70–99)

## 2019-04-09 LAB — VANCOMYCIN, RANDOM: Vancomycin Rm: 21

## 2019-04-09 MED ORDER — SODIUM CHLORIDE 0.9% IV SOLUTION: Freq: Once | INTRAVENOUS | Status: AC

## 2019-04-09 NOTE — Progress Notes (Signed)
MEWS red documented via VS at 0831; rechecked VS at 0924 MEWS yellow, will continued MEWS red for VS; Dr Clementeen Graham at the bedside rounding on pt; verbally advised Dr Clementeen Graham that the pt's previous VS indicated a MEWS of red, pt's heartrate and beat irregular; Dr Clementeen Graham advised the pt has not had her medication the last few days due to low BP; give medications as ordered and continue to monitor pt

## 2019-04-09 NOTE — Progress Notes (Signed)
Brooke Baker for Heparin  Indication: atrial fibrillation  Allergies  Allergen Reactions  . Doxycycline Diarrhea and Nausea Only  . Methotrexate Rash    Patient Measurements: Height: 5\' 5"  (165.1 cm) Weight: 100.4 kg (221 lb 5.5 oz) IBW/kg (Calculated) : 57 Heparin Dosing Weight: 73.7 kg   Vital Signs: Temp: 98.1 F (36.7 C) (04/04 0831) Temp Source: Oral (04/04 0831) BP: 100/51 (04/04 1435) Pulse Rate: 101 (04/04 1435)  Labs: Recent Labs    04/06/19 1533 04/06/19 2014 04/07/19 0445 04/07/19 0445 04/07/19 1721 04/07/19 1954 04/08/19 0529 04/08/19 0529 04/08/19 1326 04/09/19 0500 04/09/19 0515 04/09/19 1422  HGB   < >  --  7.7*   < > 8.3*   < > 7.6*  --   --  7.4*  --   --   HCT   < >  --  24.2*   < > 26.1*  --  23.4*  --   --  22.5*  --   --   PLT   < >  --  100*   < > 102*  --  101*  --   --  104*  --   --   APTT  --  98*  --   --   --   --   --   --   --   --   --   --   LABPROT  --  24.7*  --   --   --   --   --   --   --   --   --   --   INR  --  2.2*  --   --   --   --   --   --   --   --   --   --   HEPARINUNFRC  --   --   --   --   --    < > 0.45   < > 0.54 0.74*  --  0.67  CREATININE   < >  --  2.79*  --   --   --  2.69*  --   --   --  2.56*  --    < > = values in this interval not displayed.    Estimated Creatinine Clearance: 19.9 mL/min (A) (by C-G formula based on SCr of 2.56 mg/dL (H)).   Medical History: Past Medical History:  Diagnosis Date  . Acute bronchitis   . Allergic rhinitis   . Arthritis   . CAD (coronary artery disease)    s/p stent LAD 8/03 and stent placement OM1  . Cancer ([place])    skin  . Chronic back pain   . COPD with asthma ([place])   . CVA (cerebral vascular accident) ([place])   . Diabetes mellitus ([place])   . Hypercholesterolemia   . Hypertension   . Seizure disorder ([place])     Medications:  Medications Prior to Admission  Medication Sig Dispense Refill Last Dose  . albuterol  (PROVENTIL) (2.5 MG/3ML) 0.083% nebulizer solution USE ONE VIAL VIA NEBULZIER EVERY FOUR HOURS AS NEEDED FOR WHEEZING 540 mL 1 Unknown at PRN  . albuterol (VENTOLIN HFA) 108 (90 Base) MCG/ACT inhaler USE 2 PUFFS EVERY 4 HOURS AS DIRECTED - RESCUE (Patient taking differently: Inhale 2 puffs into the lungs every 4 (four) hours as needed for wheezing or shortness of breath. ) 18 g 1 Unknown at PRN  . budesonide-formoterol (SYMBICORT) 80-4.5 MCG/ACT inhaler  INHALE 2 PUFFS TWICE A DAY (Patient taking differently: Inhale 2 puffs into the lungs 2 (two) times daily. ) 10.2 g 3 04/06/2019 at 1100  . dabigatran (PRADAXA) 150 MG CAPS Take 150 mg by mouth every 12 (twelve) hours.   04/06/2019 at 1100  . gabapentin (NEURONTIN) 100 MG capsule TAKE ONE CAPSULE BY MOUTH TWICE A DAY (Patient taking differently: Take 200 mg by mouth at bedtime. ) 60 capsule 1 04/05/2019 at 2100  . levETIRAcetam (KEPPRA) 750 MG tablet Take 1 tablet (750 mg total) by mouth 2 (two) times daily. 180 tablet 0 04/06/2019 at 1100  . metoprolol succinate (TOPROL-XL) 25 MG 24 hr tablet Take 1 tablet (25 mg total) by mouth daily. 30 tablet 0 04/06/2019 at 1100  . Omega-3 Fatty Acids (FISH OIL) 1000 MG CAPS Take 1,000 mg by mouth daily.      . phenytoin (DILANTIN) 100 MG ER capsule Take 4 capsules (400 mg total) by mouth at bedtime. 360 capsule 0 04/05/2019 at 2100  . polyethylene glycol (MIRALAX / GLYCOLAX) 17 g packet Take 17 g by mouth daily as needed for mild constipation or moderate constipation.    Unknown at PRN  . potassium chloride (KLOR-CON) 10 MEQ tablet Take 1 tablet (10 mEq total) by mouth daily. 90 tablet 1 04/06/2019 at 1100  . rosuvastatin (CRESTOR) 20 MG tablet TAKE 1 TABLET BY MOUTH DAILY (Patient taking differently: Take 20 mg by mouth daily. ) 30 tablet 5 04/05/2019 at Unknown time  . sertraline (ZOLOFT) 50 MG tablet Take 75 mg by mouth at bedtime.    04/05/2019 at 2100  . vancomycin IVPB Inject 750 mg into the vein daily for 21 days.  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 Labs - Thursday:  BMP, CRP and vancomycin trough 21 Units 0     Assessment: Pharmacy consulted to dose heparin for Afib in this 85 year old female admitted with ARF.  Pt was on Pradaxa 150 mg PO BID at home, last dose was on 4/1 @ 1100.  CrC = 14.7 ml/min   -4/2 @ 1954 HL 0.15, subtherapeutic. Will increase drip rate to 1450 units/hr. No bolus in view of thrombocytopenia.  -Heparin level 8 hours after rate change -Daily CBC per protocol  0403 0529 HL 0.45, therapeutic x 1.  H/H worse, PLTs stable.  Continue Heparin drip at current rate and recheck HL in 8 hours to confirm.  4/3 @1326  HL= 0.54. will continue with current drip rate of 1450 units/hr. Will f/u HL with am labs. Hgb 7.6  Plt 101  0404 0500 HL 0.74, SUPRAtherapeutic but barely above goal.  CBC stable.  Will reduce Heparin drip to 1400 units/hr and recheck HL ~ 8 hours after rate decreased.   Goal of Therapy:  Heparin level 0.3-0.7 units/ml Monitor platelets by anticoagulation protocol: Yes   Plan:   4/4 @ 1422 HL= 0.67. Will sightly adjust Heparin drip to 1350 units/hr as near top of goal range. Will f/u with am labs  Danitra Payano A  04/09/2019,2:53 PM

## 2019-04-09 NOTE — Progress Notes (Addendum)
PROGRESS NOTE                                                                                                                                                                                                             Patient Demographics:    Brooke Baker, is a 85 y.o. female, DOB - 1934-05-28, ONG:[id number]  Admit date - 04/06/2019   Admitting Physician Lenore Cordia, MD  Outpatient Primary MD for the patient is Einar Pheasant, MD  LOS - 3        Brief Narrative 85 year old female with COPD/interstitial lung disease with chronic hypoxic respiratory failure on 2 L home O2, CAD s/p PCI, paroxysmal A. fib on Pradaxa, chronic diastolic CHF, history of CVA, type 2 diabetes mellitus, hypertension, hyperlipidemia, thrombocytopenia, seizure disorder and depression who was recently hospitalized and discharged 1 week back secondary to MRSA bacteremia with pneumonia.  She was started on IV vancomycin and plan to treat for 4-week course through a left arm PICC line. She had follow-up labs done as outpatient and found to have acute kidney injury and was told to hold the antibiotic.  At baseline patient has chronic lower extremity swelling/puffiness. Also following hospital discharge patient had an episode of nausea with vomiting and poor p.o. intake.  She was taking ibuprofen as needed for pain for almost 3 times a day recently. Daughter noticed that patient has been increasingly fatigued and sleepy last few days with some confusion.   In the ED vitals were stable.  Blood work showed normal WBC with hemoglobin of 8.2, platelets 120 1K, sodium 131, BUN of 46 and creatinine of 3.07 (worsened from baseline normal of < 1).  Random vancomycin level of 22. Chest x-ray without infiltrate and normal positioning of the PICC line. Admitted for further management of her ATN.   Subjective:   Blood pressure dropped to 70 systolic this morning  but improved to 110s without any intervention.  Tachycardic to 130s.  Denies any pain or discomfort.  Assessment  & Plan :    Principal Problem: Acute tubular necrosis Suspect vancomycin nephrotoxicity + prerenal ATN...  Also taking NSAIDs at home.  UA unremarkable.  FENa of 0.3%.  Renal function slowly improving. Continue strict I's/O.  Pt is incontinent and difficult to monitor output closely while on purewick  IV vancomycin on hold, trough improving  to 21.  Avoid NSAIDs and hold diuretic. Receiving intermittent IV normal saline due to soft blood pressure.    Active Problems: MRSA bacteremia Recent hospitalization with plan on 4 weeks IV vancomycin.  TEE was negative for vegetation.  Random vancomycin level of 24 today.  Vancomycin on hold.  ID recommends that since her levels are therapeutic does not need further antibiotic for now.  Check levels every day until 15 and can start daptomycin to complete the course.   Hyperkalemia Suspect related to AKI/NSAID use.  Improved without intervention.   Chronic diastolic CHF Recent EF of 55-60% on TEE.  Appears euvolemic and has chronic lower extremity edema.  Holding Lasix given ATN and hypotension.  Monitor I's/O and daily weight.  CAD with history of PCI On Toprol.  Reduce dose of statin given AKI.  No chest pain symptoms.  Macrocytic anemia/thrombocytopenia Monitor hemoglobin and platelets while on IV heparin.  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  Hematology planning on bone marrow biopsy as outpatient. We will transfuse 1 unit PRBC given hypotension and tachycardia.  Paroxysmal A. fib Patient tachycardic today.  Toprol was held last 2 days due to hypotension.  Blood pressure currently stable and resumed.  Hypotension Lasix and metoprolol held and patient getting intermittent IV fluid bolus.  Resume low-dose metoprolol given tachycardia and improved blood pressure.   Continue telemetry monitor. Transfuse 1 unit PRBC.   Seizure disorder Continue phenytoin.  Keppra dose reduced given AKI.  Diabetes mellitus type 2, diet controlled Stable.   Remains inpatient given persistent ATN and hypotension.  Code Status : Full code  Family Communication  : Daughter updated on the phone  Disposition Plan  : Home pending resolution of AKI   Barriers For Discharge : Active symptoms, acute kidney injury  Consults  : Renal and ID  Procedures  : Renal ultrasound  DVT Prophylaxis  : IV heparin  Lab Results  Component Value Date   PLT 104 (L) 04/09/2019    Antibiotics   Anti-infectives (From admission, onward)   None        Objective:   Vitals:   04/09/19 1013 04/09/19 1034 04/09/19 1105 04/09/19 1135  BP: (!) 107/44 (!) 91/36 (!) 103/51 90/64  Pulse: (!) 121  (!) 126 (!) 134  Resp:      Temp:      TempSrc:      SpO2: 97% 100% 100% 100%  Weight:      Height:        Wt Readings from Last 3 Encounters:  04/09/19 100.4 kg  01/26/19 104.3 kg  11/08/18 104.3 kg     Intake/Output Summary (Last 24 hours) at 04/09/2019 1303 Last data filed at 04/09/2019 0200 Gross per 24 hour  Intake 516.13 ml  Output -  Net 516.13 ml   Physical exam Not in distress, HEENT: Pallor present, moist mucosa Chest: Clear CVs: S1 and S2 tachycardic, irregular GI: Soft, nondistended, nontender Musculoskeletal: Warm, no edema, puffy extremities     Data Review:    CBC Recent Labs  Lab 04/06/19 1533 04/07/19 0445 04/07/19 1721 04/08/19 0529 04/09/19 0500  WBC 7.1 5.7 5.5 5.8 6.2  HGB 8.2* 7.7* 8.3* 7.6* 7.4*  HCT 25.3* 24.2* 26.1* 23.4* 22.5*  PLT 121* 100* 102* 101* 104*  MCV 106.8* 109.0* 109.7* 108.8* 108.2*  MCH 34.6* 34.7* 34.9* 35.3* 35.6*  MCHC 32.4 31.8 31.8 32.5 32.9  RDW 16.0* 15.7* 15.9* 15.9* 16.2*  Chemistries  Recent Labs  Lab 04/06/19 1533 04/07/19 0445 04/08/19 0529 04/09/19 0515  NA 131* 135 135 134*  K 4.9 5.2*  4.9 4.2  CL 99 102 103 103  CO2 _0 GLUCOSE 133* 104* 103* 105*  BUN 46* 47* 46* 44*  CREATININE 3.07* 2.79* 2.69* 2.56*  CALCIUM 7.4* 7.4* 7.5* 7.4*  MG  --  1.8  --   --   AST 43*  --   --   --   ALT 14  --   --   --   ALKPHOS 103  --   --   --   BILITOT 1.4*  --   --   --    ------------------------------------------------------------------------------------------------------------------ No results for input(s): CHOL, HDL, LDLCALC, TRIG, CHOLHDL, LDLDIRECT in the last 72 hours.  Lab Results  Component Value Date   HGBA1C 4.8 01/19/2019   ------------------------------------------------------------------------------------------------------------------ No results for input(s): TSH, T4TOTAL, T3FREE, THYROIDAB in the last 72 hours.  Invalid input(s): FREET3 ------------------------------------------------------------------------------------------------------------------ Recent Labs    04/07/19 0445  VITAMINB12 845  FERRITIN 305  TIBC 125*  IRON 76    Coagulation profile Recent Labs  Lab 04/06/19 2014  INR 2.2*    No results for input(s): DDIMER in the last 72 hours.  Cardiac Enzymes No results for input(s): CKMB, TROPONINI, MYOGLOBIN in the last 168 hours.  Invalid input(s): CK ------------------------------------------------------------------------------------------------------------------    Component Value Date/Time   BNP 251.0 (H) 04/06/2019 1533    Inpatient Medications  Scheduled Meds: . Chlorhexidine Gluconate Cloth  6 each Topical Q0600  . levETIRAcetam  500 mg Oral BID  . metoprolol succinate  25 mg Oral Daily  . mometasone-formoterol  2 puff Inhalation BID  . phenytoin  300 mg Oral QHS   And  . phenytoin  50 mg Oral QHS  . rosuvastatin  10 mg Oral Daily  . sertraline  75 mg Oral QHS  . sodium chloride flush  3 mL Intravenous Q12H   Continuous Infusions: . heparin 1,400 Units/hr (04/09/19 0600)   PRN Meds:.acetaminophen **OR**  acetaminophen, albuterol, ondansetron **OR** ondansetron (ZOFRAN) IV  Micro Results Recent Results (from the past 240 hour(s))  SARS CORONAVIRUS 2 (TAT 6-24 HRS) Nasopharyngeal Nasopharyngeal Swab     Status: None   Collection Time: 04/06/19 11:32 PM   Specimen: Nasopharyngeal Swab  Result Value Ref Range Status   SARS Coronavirus 2 NEGATIVE NEGATIVE Final    Comment: (NOTE) SARS-CoV-2 target nucleic acids are NOT DETECTED. The SARS-CoV-2 RNA is generally detectable in upper and lower respiratory specimens during the acute phase of infection. Negative results do not preclude SARS-CoV-2 infection, do not rule out co-infections with other pathogens, and should not be used as the sole basis for treatment or other patient management decisions. Negative results must be combined with clinical observations, patient history, and epidemiological information. The expected result is Negative. Fact Sheet for Patients: SugarRoll.be Fact Sheet for Healthcare Providers: https://www.woods-mathews.com/ This test is not yet approved or cleared by the [place] FDA and  has been authorized for detection and/or diagnosis of SARS-CoV-2 by FDA under an Emergency Use Authorization (EUA). This EUA will remain  in effect (meaning this test can be used) for the duration of the COVID-19 declaration under Section 56 4(b)(1) of the Act, 21 U.S.C. section 360bbb-3(b)(1), unless the authorization is terminated or revoked sooner. Performed at [facility], [place] [address], [place], [place] [address]   CULTURE, BLOOD (ROUTINE X 2) w Reflex to ID  Panel     Status: None (Preliminary result)   Collection Time: 04/07/19  3:54 PM   Specimen: BLOOD  Result Value Ref Range Status   Specimen Description BLOOD BLOOD RIGHT HAND  Final   Special Requests AEROBIC BOTTLE ONLY Blood Culture adequate volume  Final   Culture   Final    NO GROWTH 2 DAYS Performed at [facility], [address] [place], [place] [address]    Report Status PENDING  Incomplete  CULTURE, BLOOD (ROUTINE X 2) w Reflex to ID Panel     Status: None (Preliminary result)   Collection Time: 04/07/19  5:21 PM   Specimen: BLOOD  Result Value Ref Range Status   Specimen Description BLOOD BLOOD RIGHT HAND  Final   Special Requests   Final    BOTTLES DRAWN AEROBIC AND ANAEROBIC Blood Culture adequate volume   Culture   Final    NO GROWTH 2 DAYS Performed at [facility], [address] [place], [place] [address]    Report Status PENDING  Incomplete    Radiology Reports DG Chest 2 View  Result Date: 04/06/2019 CLINICAL DATA:  Worsening renal function. EXAM: CHEST - 2 VIEW COMPARISON:  Chest x-ray 03/26/2019, 04/20/2017.  CT 03/26/2019. FINDINGS: Left PICC line noted with tip over superior vena cava. Cardiomegaly with diffuse bilateral interstitial prominence, improved from prior studies. Findings suggest improving interstitial edema. Continued follow-up exam suggested. IMPRESSION: Left PICC line noted with tip over superior vena cava. 2. Cardiomegaly with diffuse bilateral tissue prominence, improved from prior studies. Findings suggest improving interstitial edema. Electronically Signed   By: Marcello Moores  Register   On: 04/06/2019 15:22   CT HEAD WO CONTRAST  Result Date: 03/19/2019 CLINICAL DATA:  85 year old female with fall. EXAM: CT HEAD WITHOUT CONTRAST TECHNIQUE: Contiguous axial images were obtained from the base of the skull through the vertex without intravenous contrast. COMPARISON:  Head CT dated 09/08/2018. FINDINGS: Brain: There is moderate age-related atrophy and chronic microvascular ischemic changes. There is no acute intracranial hemorrhage. No mass effect or midline shift. No extra-axial fluid collection. Vascular: No hyperdense vessel or unexpected calcification. Skull: Normal. Negative for fracture or focal lesion. Sinuses/Orbits: There is opacification of the  left frontal sinuses and maxillary sinuses with remodeling of the medial wall of the right maxillary sinus. Opacification of the left sphenoid sinuses. No air-fluid level. The mastoid air cells are clear. Other: None IMPRESSION: 1. No acute intracranial pathology. 2. Age-related atrophy and chronic microvascular ischemic changes. 3. Chronic paranasal sinus disease. Electronically Signed   By: Anner Crete M.D.   On: 03/19/2019 22:31   CT ANGIO CHEST PE W OR WO CONTRAST  Result Date: 03/26/2019 CLINICAL DATA:  Shortness of breath. Multiple medical problems. EXAM: CT ANGIOGRAPHY CHEST WITH CONTRAST TECHNIQUE: Multidetector CT imaging of the chest was performed using the standard protocol during bolus administration of intravenous contrast. Multiplanar CT image reconstructions and MIPs were obtained to evaluate the vascular anatomy. CONTRAST:  16m OMNIPAQUE IOHEXOL 350 MG/ML SOLN COMPARISON:  01/26/2019 FINDINGS: Cardiovascular: Heart is markedly enlarged and stable in configuration. There is dense atherosclerotic calcification of the coronary arteries. Atherosclerosis of the thoracic aorta is not associated with aneurysm. Pulmonary arteries are well opacified and there is no evidence for acute pulmonary embolus. Mediastinum/Nodes: The visualized portion of the thyroid gland has a normal appearance. Esophagus is unremarkable. Numerous small mediastinal and hilar lymph nodes. Axillary regions are unremarkable. Lungs/Pleura: Moderate bilateral pleural effusions, RIGHT greater than LEFT. Dependent  atelectasis/consolidation in the posterior LOWER lobes bilaterally. There are focal and confluent ground-glass opacities throughout the lungs, favoring pulmonary edema over infectious process. Upper Abdomen: Cirrhotic morphology of the liver. Spleen appears enlarged. Dilated inferior vena cava consistent with RIGHT heart failure. Musculoskeletal: No chest wall abnormality. No acute or significant osseous findings.  Review of the MIP images confirms the above findings. IMPRESSION: 1. Technically adequate exam showing no acute pulmonary embolus. 2. Cardiomegaly and changes of RIGHT heart failure. 3. Moderate bilateral pleural effusions, RIGHT greater than LEFT. 4. Focal and confluent ground-glass opacities throughout the lungs, favoring pulmonary edema over infectious process. 5. Cirrhotic morphology of the liver. 6. Splenomegaly. 7. Aortic Atherosclerosis (ICD10-I70.0). Electronically Signed   By: Nolon Nations M.D.   On: 03/26/2019 12:23   CT CERVICAL SPINE WO CONTRAST  Result Date: 03/23/2019 CLINICAL DATA:  Sepsis and lower extremity edema EXAM: CT CERVICAL SPINE WITHOUT CONTRAST TECHNIQUE: Multidetector CT imaging of the cervical spine was performed without intravenous contrast. Multiplanar CT image reconstructions were also generated. COMPARISON:  09/08/2018 FINDINGS: Alignment: Grade 1 anterolisthesis at C3-4 and C4-5. Reversal of normal cervical lordosis may be positional or due to muscle spasm. Skull base and vertebrae: No acute fracture. Vertebral body heights are maintained. Soft tissues and spinal canal: No prevertebral fluid or swelling. No visible canal hematoma. Disc levels:  No bony spinal canal stenosis. Upper chest: Bilateral pleural effusions. Other: None IMPRESSION: 1. No acute fracture or static subluxation of the cervical spine. 2. Bilateral pleural effusions. Electronically Signed   By: Ulyses Jarred M.D.   On: 03/23/2019 20:55   CT LUMBAR SPINE WO CONTRAST  Result Date: 03/23/2019 CLINICAL DATA:  Sepsis EXAM: CT LUMBAR SPINE WITHOUT CONTRAST TECHNIQUE: Multidetector CT imaging of the lumbar spine was performed without intravenous contrast administration. Multiplanar CT image reconstructions were also generated. COMPARISON:  CT myelogram 09/10/2004 FINDINGS: Segmentation: Normal Alignment: Grade 1 retrolisthesis at L2-3 and L3-4. Vertebrae: Unchanged chronic compression deformity of L2. L4-S1  posterior instrumented fusion. Paraspinal and other soft tissues: Calcific aortic atherosclerosis and bilateral pleural effusions. Horseshoe kidney. Disc levels: L2-3: Mild spinal canal stenosis due to combination of endplate spurring and disc bulge. Moderate facet hypertrophy. Neural foramina are patent. L3-4: Mild spinal canal stenosis due to disc bulge and facet hypertrophy. No stenosis. L4-5: Status post right laminectomy. No spinal canal or neural foraminal stenosis. L5-S1: Posterior decompression. No spinal canal stenosis. Moderate right and mild left neural foraminal stenosis, unchanged. IMPRESSION: 1. No acute fracture or static subluxation of the lumbar spine. Status post L4-S1 posterior instrumented fusion. No hardware adverse features. 2. Unchanged moderate right and mild left L5-S1 neural foraminal stenosis. 3. Mild L2-3 and L3-4 spinal canal stenosis. 4. Aortic Atherosclerosis (ICD10-I70.0). Electronically Signed   By: Ulyses Jarred M.D.   On: 03/23/2019 21:10   US RENAL  Result Date: 04/07/2019 CLINICAL DATA:  Acute renal failure EXAM: RENAL / URINARY TRACT ULTRASOUND COMPLETE COMPARISON:  None. FINDINGS: Right Kidney: Renal measurements: 10.2 x 3.9 x 3.5 cm. = volume: 72 mL. Mild increased echogenicity is noted. 9 mm cyst is noted within the kidney in the lower pole. Left Kidney: Renal measurements: 10.1 x 4.3 x 3.9 cm. = volume: 88.4 mL. Mild increased echogenicity is noted. No mass lesion or hydronephrosis is noted. Bladder: Appears normal for degree of bladder distention. Other: Gallstones are seen.  Small effusions are noted bilaterally. IMPRESSION: Mild increased echogenicity within the kidneys bilaterally consistent with medical renal disease. Small right renal cyst. Cholelithiasis. Bilateral pleural  effusions. Electronically Signed   By: Inez Catalina M.D.   On: 04/07/2019 00:59   DG Chest Port 1 View  Result Date: 03/26/2019 CLINICAL DATA:  Shortness of breath and chest pain. EXAM:  PORTABLE CHEST 1 VIEW COMPARISON:  March 25, 2019 FINDINGS: Stable cardiomegaly. Diffuse interstitial opacity on the left and patchy infiltrate on the right are stable. No interval changes. IMPRESSION: No interval change in bilateral pulmonary opacities. Electronically Signed   By: Dorise Bullion III M.D   On: 03/26/2019 10:28   DG Chest Port 1 View  Result Date: 03/25/2019 CLINICAL DATA:  Community acquired pneumonia. Patient states having a cough and some sob today. Hx of bronchitis, CAD, COPD. Never smoker. EXAM: PORTABLE CHEST 1 VIEW COMPARISON:  Chest radiograph 03/22/2019 FINDINGS: Stable position of a left upper extremity PICC. Unchanged cardiomediastinal contours. There are diffuse bilateral interstitial and airspace opacities, not significantly changed. Probable small bilateral effusions. No pneumothorax. No acute finding in the visualized skeleton. IMPRESSION: Stable diffuse bilateral interstitial and airspace opacities. Electronically Signed   By: Audie Pinto M.D.   On: 03/25/2019 17:53   DG Chest Port 1 View  Result Date: 03/22/2019 CLINICAL DATA:  PICC placement EXAM: PORTABLE CHEST 1 VIEW COMPARISON:  03/18/2019 chest radiograph. FINDINGS: Left PICC terminates at the cavoatrial junction. Stable cardiomediastinal silhouette with mild cardiomegaly. No pneumothorax. Small bilateral pleural effusions, stable. Patchy hazy and linear interstitial opacities throughout both lungs, similar. IMPRESSION: 1. Left PICC terminates at the cavoatrial junction. 2. Stable small bilateral pleural effusions. 3. Mild cardiomegaly. Patchy hazy and linear interstitial opacities throughout both lungs, similar, either pulmonary edema or atypical pneumonia. Electronically Signed   By: Ilona Sorrel M.D.   On: 03/22/2019 18:17   DG Chest Port 1 View  Result Date: 03/18/2019 CLINICAL DATA:  85 year old female with sepsis. EXAM: PORTABLE CHEST 1 VIEW COMPARISON:  Chest radiograph dated 04/20/2017. FINDINGS: There  is chronic interstitial coarsening and bronchitic changes. Overall slight interval progression of interstitial densities since the prior radiograph which may represent progression of background of lung disease although superimposed pneumonia is not excluded. Clinical correlation is recommended. No focal consolidation or pneumothorax. Trace bilateral pleural effusions may be present. There is mild cardiomegaly. Atherosclerotic calcification of the aorta. No acute osseous pathology. IMPRESSION: Slight interval progression of interstitial densities may represent progression of lung disease versus superimposed pneumonia. Electronically Signed   By: Anner Crete M.D.   On: 03/18/2019 23:43   ECHOCARDIOGRAM COMPLETE  Result Date: 03/20/2019    ECHOCARDIOGRAM REPORT   Patient Name:   SHARMIN FOULK Date of Exam: 03/20/2019 Medical Rec #:  [id number]        Height:       66.0 in Accession #:    [id number]       Weight:       162.0 lb Date of Birth:  06-02-1934        BSA:          1.828 m Patient Age:    72 years         BP:           57/93 mmHg Patient Gender: F                HR:           114 bpm. Exam Location:  [facility] Procedure: 2D Echo, Cardiac Doppler and Color Doppler Indications:     Dyspnea 786.09  History:         Patient  has no prior history of Echocardiogram examinations.                  CAD, COPD; Risk Factors:Hypertension. CVA.  Sonographer:     Sherrie Sport RDCS (AE) Referring Phys:  [id number] [facility] AMIN Diagnosing Phys: Bartholome Bill MD IMPRESSIONS  1. Left ventricular ejection fraction, by estimation, is 70 to 75%. Left ventricular ejection fraction by PLAX is 75 %. The left ventricle has hyperdynamic function. The left ventricle has no regional wall motion abnormalities. Left ventricular diastolic parameters are consistent with Grade I diastolic dysfunction (impaired relaxation).  2. Right ventricular systolic function is normal. The right ventricular size is mildly enlarged. There is moderately  elevated pulmonary artery systolic pressure.  3. Left atrial size was mildly dilated.  4. Right atrial size was mildly dilated.  5. The mitral valve is grossly normal. Mild mitral valve regurgitation.  6. The aortic valve is grossly normal. Aortic valve regurgitation is trivial. FINDINGS  Left Ventricle: Left ventricular ejection fraction, by estimation, is 70 to 75%. Left ventricular ejection fraction by PLAX is 75 % The left ventricle has hyperdynamic function. The left ventricle has no regional wall motion abnormalities. The left ventricular internal cavity size was normal in size. There is borderline left ventricular hypertrophy. Left ventricular diastolic parameters are consistent with Grade I diastolic dysfunction (impaired relaxation). Right Ventricle: The right ventricular size is mildly enlarged. No increase in right ventricular wall thickness. Right ventricular systolic function is normal. There is moderately elevated pulmonary artery systolic pressure. The tricuspid regurgitant velocity is 3.27 m/s, and with an assumed right atrial pressure of 10 mmHg, the estimated right ventricular systolic pressure is 41.9 mmHg. Left Atrium: Left atrial size was mildly dilated. Right Atrium: Right atrial size was mildly dilated. Pericardium: There is no evidence of pericardial effusion. Mitral Valve: The mitral valve is grossly normal. Mild mitral valve regurgitation. Tricuspid Valve: The tricuspid valve is grossly normal. Tricuspid valve regurgitation is mild. Aortic Valve: The aortic valve is grossly normal. Aortic valve regurgitation is trivial. Aortic valve mean gradient measures 3.5 mmHg. Aortic valve peak gradient measures 7.2 mmHg. Aortic valve area, by VTI measures 2.48 cm. Pulmonic Valve: The pulmonic valve was grossly normal. Pulmonic valve regurgitation is mild. Aorta: The aortic root was not well visualized. IAS/Shunts: The atrial septum is grossly normal.  LEFT VENTRICLE PLAX 2D LV EF:         Left  ventricular ejection fraction by PLAX is 75 % LVIDd:         3.69 cm LVIDs:         2.09 cm LV PW:         1.35 cm LV IVS:        0.78 cm LVOT diam:     2.00 cm LV SV:         58 LV SV Index:   32 LVOT Area:     3.14 cm  RIGHT VENTRICLE RV Basal diam:  4.34 cm LEFT ATRIUM            Index       RIGHT ATRIUM           Index LA diam:      4.70 cm  2.57 cm/m  RA Area:     24.40 cm LA Vol (A2C): 158.0 ml 86.41 ml/m RA Volume:   87.20 ml  47.69 ml/m LA Vol (A4C): 52.8 ml  28.88 ml/m  AORTIC VALVE  PULMONIC VALVE AV Area (Vmax):    2.20 cm    PV Vmax:        0.71 m/s AV Area (Vmean):   2.43 cm    PV Peak grad:   2.0 mmHg AV Area (VTI):     2.48 cm    RVOT Peak grad: 2 mmHg AV Vmax:           134.00 cm/s AV Vmean:          87.550 cm/s AV VTI:            0.234 m AV Peak Grad:      7.2 mmHg AV Mean Grad:      3.5 mmHg LVOT Vmax:         93.80 cm/s LVOT Vmean:        67.600 cm/s LVOT VTI:          0.185 m LVOT/AV VTI ratio: 0.79  AORTA Ao Root diam: 3.10 cm MITRAL VALVE                TRICUSPID VALVE MV Area (PHT): 4.89 cm     TR Peak grad:   42.8 mmHg MV Decel Time: 155 msec     TR Vmax:        327.00 cm/s MV E velocity: 127.00 cm/s MV A velocity: 128.00 cm/s  SHUNTS MV E/A ratio:  0.99         Systemic VTI:  0.18 m                             Systemic Diam: 2.00 cm Bartholome Bill MD Electronically signed by Bartholome Bill MD Signature Date/Time: 03/20/2019/4:00:44 PM    Final    ECHO TEE  Result Date: 03/21/2019    TRANSESOPHOGEAL ECHO REPORT   Patient Name:   ARDIE DRAGOO Date of Exam: 03/21/2019 Medical Rec #:  [id number]        Height:       66.0 in Accession #:    [id number]       Weight:       238.5 lb Date of Birth:  [birth year]        BSA:          2.155 m Patient Age:    64 years         BP:           107/40 mmHg Patient Gender: F                HR:           111 bpm. 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 COPD; Risk Factors:Hypertension and Diabetes.                  CVA.  Sonographer:     Sherrie Sport RDCS (AE) Referring Phys:  [place] Diagnosing Phys: Bartholome Bill MD PROCEDURE: TEE procedure time was 16 minutes. The transesophogeal probe was passed without difficulty through the esophogus of the patient. Imaged were obtained with the patient in a left lateral decubitus position. Local oropharyngeal anesthetic was provided with viscous lidocaine and  Benzocaine spray. Sedation performed by performing physician. Image quality was excellent. The patient's vital signs; including heart rate, blood pressure, and oxygen saturation; remained stable throughout the procedure. The patient developed no complications during the procedure. IMPRESSIONS  1. Left ventricular ejection fraction, by estimation, is 55 to 60%. The left ventricle has normal function. The left ventricle has no regional wall motion abnormalities.  2. Right ventricular systolic function is normal. The right ventricular size is mildly enlarged.  3. No left atrial/left atrial appendage thrombus was detected.  4. Right atrial size was moderately dilated.  5. The mitral valve is grossly normal. Mild mitral valve regurgitation.  6. Tricuspid valve regurgitation is mild to moderate.  7. The aortic valve is tricuspid. Aortic valve regurgitation is trivial. No aortic stenosis is present. Conclusion(s)/Recommendation(s): No evidence of vegetation/infective endocarditis on this transesophageal echocardiogram. FINDINGS  Left Ventricle: Left ventricular ejection fraction, by estimation, is 55 to 60%. The left ventricle has normal function. The left ventricle has no regional wall motion abnormalities. The left ventricular internal cavity size was normal in size. There is  no left ventricular hypertrophy. Right  Ventricle: The right ventricular size is mildly enlarged. No increase in right ventricular wall thickness. Right ventricular systolic function is normal. Left Atrium: Left atrial size was normal in size. No left atrial/left atrial appendage thrombus was detected. Right Atrium: Right atrial size was moderately dilated. Pericardium: There is no evidence of pericardial effusion. Mitral Valve: The mitral valve is grossly normal. Mild mitral valve regurgitation. There is no evidence of mitral valve vegetation. Tricuspid Valve: The tricuspid valve is grossly normal. Tricuspid valve regurgitation is mild to moderate. Aortic Valve: The aortic valve is tricuspid. Aortic valve regurgitation is trivial. No aortic stenosis is present. There is no evidence of aortic valve vegetation. Pulmonic Valve: The pulmonic valve was not well visualized. Pulmonic valve regurgitation is trivial. Aorta: The aortic root is normal in size and structure. IAS/Shunts: The atrial septum is grossly normal. Bartholome Bill MD Electronically signed by Bartholome Bill MD Signature Date/Time: 03/21/2019/10:50:39 AM    Final    [place] EKG SITE RITE  Result Date: 03/22/2019 If Site Rite image not attached, placement could not be confirmed due to current cardiac rhythm.   Time Spent in minutes 35   Noemi Ishmael M.D on 04/09/2019 at 1:03 PM  Between 7am to 7pm - Pager - [phone]  After 7pm go to [email or web address] - password [facility]  [facility] -  Office  [phone]

## 2019-04-09 NOTE — Progress Notes (Signed)
[facility]  ROUNDING NOTE   Subjective:   Creatinine 2.56 (2.69) (2.79)  Objective:  Vital signs in last 24 hours:  Temp:  [97.8 F (36.6 C)-98.4 F (36.9 C)] 98.1 F (36.7 C) (04/04 0831) Pulse Rate:  [60-148] 134 (04/04 1135) Resp:  [18-20] 18 (04/04 0924) BP: (78-117)/(36-64) 90/64 (04/04 1135) SpO2:  [84 %-100 %] 100 % (04/04 1135) Weight:  [100.4 kg] 100.4 kg (04/04 0445)  Weight change: 1.2 kg Filed Weights   04/07/19 0452 04/08/19 0500 04/09/19 0445  Weight: 99.9 kg 99.2 kg 100.4 kg    Intake/Output: I/O last 3 completed shifts: In: 516.1 [I.V.:516.1] Out: -    Intake/Output this shift:  No intake/output data recorded.  Physical Exam: General: NAD,   Head: Normocephalic, atraumatic. Moist oral mucosal membranes  Eyes: Anicteric, PERRL  Neck: Supple, trachea midline  Lungs:  Clear to auscultation  Heart: Regular rate and rhythm  Abdomen:  Soft, nontender,   Extremities:  + peripheral edema.  Neurologic: Nonfocal, moving all four extremities  Skin: No lesions        Basic Metabolic Panel: Recent Labs  Lab 04/06/19 1533 04/06/19 1533 04/07/19 0445 04/08/19 0529 04/09/19 0515  NA 131*  --  135 135 134*  K 4.9  --  5.2* 4.9 4.2  CL 99  --  102 103 103  CO2 27  --  27 25 26   GLUCOSE 133*  --  104* 103* 105*  BUN 46*  --  47* 46* 44*  CREATININE 3.07*  --  2.79* 2.69* 2.56*  CALCIUM 7.4*   < > 7.4* 7.5* 7.4*  MG  --   --  1.8  --   --   PHOS  --   --  4.1  --   --    < > = values in this interval not displayed.    Liver Function Tests: Recent Labs  Lab 04/06/19 1533  AST 43*  ALT 14  ALKPHOS 103  BILITOT 1.4*  PROT 7.0  ALBUMIN 1.9*   No results for input(s): LIPASE, AMYLASE in the last 168 hours. No results for input(s): AMMONIA in the last 168 hours.  CBC: Recent Labs  Lab 04/06/19 1533 04/07/19 0445 04/07/19 1721 04/08/19 0529 04/09/19 0500  WBC 7.1 5.7 5.5 5.8 6.2  HGB 8.2* 7.7* 8.3* 7.6* 7.4*  HCT 25.3*  24.2* 26.1* 23.4* 22.5*  MCV 106.8* 109.0* 109.7* 108.8* 108.2*  PLT 121* 100* 102* 101* 104*    Cardiac Enzymes: No results for input(s): CKTOTAL, CKMB, CKMBINDEX, TROPONINI in the last 168 hours.  BNP: Invalid input(s): POCBNP  CBG: No results for input(s): GLUCAP in the last 168 hours.  Microbiology: Results for orders placed or performed during the hospital encounter of 04/06/19  SARS CORONAVIRUS 2 (TAT 6-24 HRS) Nasopharyngeal Nasopharyngeal Swab     Status: None   Collection Time: 04/06/19 11:32 PM   Specimen: Nasopharyngeal Swab  Result Value Ref Range Status   SARS Coronavirus 2 NEGATIVE NEGATIVE Final    Comment: (NOTE) SARS-CoV-2 target nucleic acids are NOT DETECTED. The SARS-CoV-2 RNA is generally detectable in upper and lower respiratory specimens during the acute phase of infection. Negative results do not preclude SARS-CoV-2 infection, do not rule out co-infections with other pathogens, and should not be used as the sole basis for treatment or other patient management decisions. Negative results must be combined with clinical observations, patient history, and epidemiological information. The expected result is Negative. Fact Sheet for Patients: SugarRoll.be Fact  Sheet for Healthcare Providers: https://www.woods-mathews.com/ This test is not yet approved or cleared by the [place] FDA and  has been authorized for detection and/or diagnosis of SARS-CoV-2 by FDA under an Emergency Use Authorization (EUA). This EUA will remain  in effect (meaning this test can be used) for the duration of the COVID-19 declaration under Section 56 4(b)(1) of the Act, 21 U.S.C. section 360bbb-3(b)(1), unless the authorization is terminated or revoked sooner. Performed at [facility], [place] [address] [place], [place] [address]   CULTURE, BLOOD (ROUTINE X 2) w Reflex to ID Panel     Status: None (Preliminary result)   Collection  Time: 04/07/19  3:54 PM   Specimen: BLOOD  Result Value Ref Range Status   Specimen Description BLOOD BLOOD RIGHT HAND  Final   Special Requests AEROBIC BOTTLE ONLY Blood Culture adequate volume  Final   Culture   Final    NO GROWTH 2 DAYS Performed at [facility], [address] [place], [place] [address]    Report Status PENDING  Incomplete  CULTURE, BLOOD (ROUTINE X 2) w Reflex to ID Panel     Status: None (Preliminary result)   Collection Time: 04/07/19  5:21 PM   Specimen: BLOOD  Result Value Ref Range Status   Specimen Description BLOOD BLOOD RIGHT HAND  Final   Special Requests   Final    BOTTLES DRAWN AEROBIC AND ANAEROBIC Blood Culture adequate volume   Culture   Final    NO GROWTH 2 DAYS Performed at [facility], [address] [place], [place] [address]    Report Status PENDING  Incomplete    Coagulation Studies: Recent Labs    04/06/19 2012/04/11  LABPROT 24.7*  INR 2.2*    Urinalysis: Recent Labs    04/06/19 2200  COLORURINE AMBER*  LABSPEC 1.014  PHURINE 5.0  GLUCOSEU NEGATIVE  HGBUR SMALL*  BILIRUBINUR NEGATIVE  KETONESUR NEGATIVE  PROTEINUR 30*  NITRITE NEGATIVE  LEUKOCYTESUR TRACE*      Imaging: No results found.   Medications:   . heparin 1,400 Units/hr (04/09/19 0600)   . Chlorhexidine Gluconate Cloth  6 each Topical Q0600  . levETIRAcetam  500 mg Oral BID  . metoprolol succinate  25 mg Oral Daily  . mometasone-formoterol  2 puff Inhalation BID  . phenytoin  300 mg Oral QHS   And  . phenytoin  50 mg Oral QHS  . rosuvastatin  10 mg Oral Daily  . sertraline  75 mg Oral QHS  . sodium chloride flush  3 mL Intravenous Q12H   acetaminophen **OR** acetaminophen, albuterol, ondansetron **OR** ondansetron (ZOFRAN) IV  Assessment/ Plan:  Ms. Brooke Baker is a 85 y.o. white female with COPD, interstitial lung disease, coronary artery disease, atrial fibrillation, diastolic congestive heart failure, CVA, diabetes  mellitus type 2, hypertension, hyperlipidemia, seizure disorder, thrombocytopenia, depression, who was admitted to [facility] on 04/06/2019 for Acute renal failure (ARF) ([place]) [N17.9] AKI (acute kidney injury) ([place]) [N17.9]  1.  Acute kidney injury with hyperkalemia.  Baseline creatinine 0.8 with normal range GFR.  Was on potassium chloride on admission Acute kidney injury secondary to ATN from vancomycin, ibuprofen and IV contrast exposure on 3/21.  Holding IV fluids at this time. Kidney function is improving.  No indication for dialysis at this time.   2. Bacteremia: MRSA. Vanc level this morning of 21. Holding vancomycin. Resume when less than 15 - Appreciate ID input.  3. Hypoalbuminemia: secondary to poor nutritional  status. Cause of her peripheral edema.   4. Anemia with renal failure: hemoglobin 7.4, macrocytic. Iron studies, vitamin B12 and folate are at goal. SPEP/UPEP negative from 01/30/19.   5. Hypertension - metoprolol   LOS: 3 [place] Tat 4/4/202111:49 AM

## 2019-04-09 NOTE — Progress Notes (Signed)
Brooke Baker for Heparin  Indication: atrial fibrillation  Allergies  Allergen Reactions  . Doxycycline Diarrhea and Nausea Only  . Methotrexate Rash    Patient Measurements: Height: 5\' 5"  (165.1 cm) Weight: 100.4 kg (221 lb 5.5 oz) IBW/kg (Calculated) : 57 Heparin Dosing Weight: 73.7 kg   Vital Signs: Temp: 98.3 F (36.8 C) (04/03 2352) Temp Source: Oral (04/03 2352) BP: 111/41 (04/03 2352) Pulse Rate: 120 (04/03 2352)  Labs: Recent Labs    04/06/19 1533 04/06/19 1533 04/06/19 2014 04/07/19 0445 04/07/19 0445 04/07/19 1721 04/07/19 1954 04/08/19 0529 04/08/19 1326 04/09/19 0500  HGB 8.2*   < >  --  7.7*   < > 8.3*   < > 7.6*  --  7.4*  HCT 25.3*   < >  --  24.2*   < > 26.1*  --  23.4*  --  22.5*  PLT 121*   < >  --  100*   < > 102*  --  101*  --  104*  APTT  --   --  98*  --   --   --   --   --   --   --   LABPROT  --   --  24.7*  --   --   --   --   --   --   --   INR  --   --  2.2*  --   --   --   --   --   --   --   HEPARINUNFRC  --   --   --   --   --   --    < > 0.45 0.54 0.74*  CREATININE 3.07*  --   --  2.79*  --   --   --  2.69*  --   --    < > = values in this interval not displayed.    Estimated Creatinine Clearance: 18.9 mL/min (A) (by C-G formula based on SCr of 2.69 mg/dL (H)).   Medical History: Past Medical History:  Diagnosis Date  . Acute bronchitis   . Allergic rhinitis   . Arthritis   . CAD (coronary artery disease)    s/p stent LAD 8/03 and stent placement OM1  . Cancer ([place])    skin  . Chronic back pain   . COPD with asthma ([place])   . CVA (cerebral vascular accident) ([place])   . Diabetes mellitus ([place])   . Hypercholesterolemia   . Hypertension   . Seizure disorder ([place])     Medications:  Medications Prior to Admission  Medication Sig Dispense Refill Last Dose  . albuterol (PROVENTIL) (2.5 MG/3ML) 0.083% nebulizer solution USE ONE VIAL VIA NEBULZIER EVERY FOUR HOURS AS NEEDED FOR WHEEZING 540  mL 1 Unknown at PRN  . albuterol (VENTOLIN HFA) 108 (90 Base) MCG/ACT inhaler USE 2 PUFFS EVERY 4 HOURS AS DIRECTED - RESCUE (Patient taking differently: Inhale 2 puffs into the lungs every 4 (four) hours as needed for wheezing or shortness of breath. ) 18 g 1 Unknown at PRN  . budesonide-formoterol (SYMBICORT) 80-4.5 MCG/ACT inhaler INHALE 2 PUFFS TWICE A DAY (Patient taking differently: Inhale 2 puffs into the lungs 2 (two) times daily. ) 10.2 g 3 04/06/2019 at 1100  . dabigatran (PRADAXA) 150 MG CAPS Take 150 mg by mouth every 12 (twelve) hours.   04/06/2019 at 1100  . gabapentin (NEURONTIN) 100 MG capsule TAKE ONE CAPSULE BY  MOUTH TWICE A DAY (Patient taking differently: Take 200 mg by mouth at bedtime. ) 60 capsule 1 04/05/2019 at 2100  . levETIRAcetam (KEPPRA) 750 MG tablet Take 1 tablet (750 mg total) by mouth 2 (two) times daily. 180 tablet 0 04/06/2019 at 1100  . metoprolol succinate (TOPROL-XL) 25 MG 24 hr tablet Take 1 tablet (25 mg total) by mouth daily. 30 tablet 0 04/06/2019 at 1100  . Omega-3 Fatty Acids (FISH OIL) 1000 MG CAPS Take 1,000 mg by mouth daily.      . phenytoin (DILANTIN) 100 MG ER capsule Take 4 capsules (400 mg total) by mouth at bedtime. 360 capsule 0 04/05/2019 at 2100  . polyethylene glycol (MIRALAX / GLYCOLAX) 17 g packet Take 17 g by mouth daily as needed for mild constipation or moderate constipation.    Unknown at PRN  . potassium chloride (KLOR-CON) 10 MEQ tablet Take 1 tablet (10 mEq total) by mouth daily. 90 tablet 1 04/06/2019 at 1100  . rosuvastatin (CRESTOR) 20 MG tablet TAKE 1 TABLET BY MOUTH DAILY (Patient taking differently: Take 20 mg by mouth daily. ) 30 tablet 5 04/05/2019 at Unknown time  . sertraline (ZOLOFT) 50 MG tablet Take 75 mg by mouth at bedtime.    04/05/2019 at 2100  . vancomycin IVPB Inject 750 mg into the vein daily for 21 days. Indication: MRSA bacteremia with joint hardware (TEE neg) Last Day of Therapy: 04/15/2019 Labs - Sunday/Monday:  CBC/D, CMP, and  vancomycin trough. Labs - Thursday:  BMP, CRP and vancomycin trough 21 Units 0     Assessment: Pharmacy consulted to dose heparin for Afib in this 85 year old female admitted with ARF.  Pt was on Pradaxa 150 mg PO BID at home, last dose was on 4/1 @ 1100.  CrC = 14.7 ml/min   -4/2 @ 1954 HL 0.15, subtherapeutic. Will increase drip rate to 1450 units/hr. No bolus in view of thrombocytopenia.  -Heparin level 8 hours after rate change -Daily CBC per protocol  0403 0529 HL 0.45, therapeutic x 1.  H/H worse, PLTs stable.  Continue Heparin drip at current rate and recheck HL in 8 hours to confirm.  Goal of Therapy:  Heparin level 0.3-0.7 units/ml Monitor platelets by anticoagulation protocol: Yes   Plan:  4/3 @1326  HL= 0.54. will continue with current drip rate of 1450 units/hr. Will f/u HL with am labs. Hgb 7.6  Plt 101  0404 0500 HL 0.74, SUPRAtherapeutic but barely above goal.  CBC stable.  Will reduce Heparin drip to 1400 units/hr and recheck HL ~ 8 hours after rate decreased.    Brooke Baker, Brooke Baker A  04/09/2019,5:43 AM

## 2019-04-10 ENCOUNTER — Inpatient Hospital Stay: Payer: Medicare PPO

## 2019-04-10 DIAGNOSIS — E1140 Type 2 diabetes mellitus with diabetic neuropathy, unspecified: Secondary | ICD-10-CM

## 2019-04-10 DIAGNOSIS — R0602 Shortness of breath: Secondary | ICD-10-CM

## 2019-04-10 DIAGNOSIS — J9610 Chronic respiratory failure, unspecified whether with hypoxia or hypercapnia: Secondary | ICD-10-CM

## 2019-04-10 DIAGNOSIS — G40909 Epilepsy, unspecified, not intractable, without status epilepticus: Secondary | ICD-10-CM

## 2019-04-10 DIAGNOSIS — I10 Essential (primary) hypertension: Secondary | ICD-10-CM

## 2019-04-10 DIAGNOSIS — I5032 Chronic diastolic (congestive) heart failure: Secondary | ICD-10-CM

## 2019-04-10 DIAGNOSIS — E1159 Type 2 diabetes mellitus with other circulatory complications: Secondary | ICD-10-CM

## 2019-04-10 LAB — TYPE AND SCREEN
ABO/RH(D): O POS
Antibody Screen: NEGATIVE
Unit division: 0

## 2019-04-10 LAB — CBC
HCT: 28.2 % — ABNORMAL LOW (ref 36.0–46.0)
Hemoglobin: 9.1 g/dL — ABNORMAL LOW (ref 12.0–15.0)
MCH: 33.5 pg (ref 26.0–34.0)
MCHC: 32.3 g/dL (ref 30.0–36.0)
MCV: 103.7 fL — ABNORMAL HIGH (ref 80.0–100.0)
Platelets: 119 10*3/uL — ABNORMAL LOW (ref 150–400)
RBC: 2.72 MIL/uL — ABNORMAL LOW (ref 3.87–5.11)
RDW: 21.2 % — ABNORMAL HIGH (ref 11.5–15.5)
WBC: 7.9 10*3/uL (ref 4.0–10.5)
nRBC: 0 % (ref 0.0–0.2)

## 2019-04-10 LAB — BASIC METABOLIC PANEL
Anion gap: 4 — ABNORMAL LOW (ref 5–15)
BUN: 42 mg/dL — ABNORMAL HIGH (ref 8–23)
CO2: 27 mmol/L (ref 22–32)
Calcium: 8 mg/dL — ABNORMAL LOW (ref 8.9–10.3)
Chloride: 105 mmol/L (ref 98–111)
Creatinine, Ser: 2.47 mg/dL — ABNORMAL HIGH (ref 0.44–1.00)
GFR calc Af Amer: 20 mL/min — ABNORMAL LOW (ref >60)
GFR calc non Af Amer: 18 mL/min — ABNORMAL LOW (ref >60)
Glucose, Bld: 102 mg/dL — ABNORMAL HIGH (ref 70–99)
Potassium: 4.3 mmol/L (ref 3.5–5.1)
Sodium: 136 mmol/L (ref 135–145)

## 2019-04-10 LAB — HEPARIN LEVEL (UNFRACTIONATED): Heparin Unfractionated: 0.49 IU/mL (ref 0.30–0.70)

## 2019-04-10 LAB — PROTEIN ELECTROPHORESIS, SERUM
A/G Ratio: 0.5 — ABNORMAL LOW (ref 0.7–1.7)
Albumin ELP: 2.4 g/dL — ABNORMAL LOW (ref 2.9–4.4)
Alpha-1-Globulin: 0.3 g/dL (ref 0.0–0.4)
Alpha-2-Globulin: 0.4 g/dL (ref 0.4–1.0)
Beta Globulin: 1 g/dL (ref 0.7–1.3)
Gamma Globulin: 3.2 g/dL — ABNORMAL HIGH (ref 0.4–1.8)
Globulin, Total: 4.9 g/dL — ABNORMAL HIGH (ref 2.2–3.9)
Total Protein ELP: 7.3 g/dL (ref 6.0–8.5)

## 2019-04-10 LAB — HEMOGLOBIN AND HEMATOCRIT, BLOOD
HCT: 23.5 % — ABNORMAL LOW (ref 36.0–46.0)
Hemoglobin: 7.9 g/dL — ABNORMAL LOW (ref 12.0–15.0)

## 2019-04-10 LAB — BPAM RBC
Blood Product Expiration Date: 202104062359
ISSUE DATE / TIME: 202104041619
Unit Type and Rh: 9500

## 2019-04-10 LAB — CK: Total CK: 30 U/L — ABNORMAL LOW (ref 38–234)

## 2019-04-10 LAB — VANCOMYCIN, RANDOM: Vancomycin Rm: 16

## 2019-04-10 MED ORDER — ALBUMIN HUMAN 25 % IV SOLN
25.0000 g | Freq: Two times a day (BID) | INTRAVENOUS | Status: DC
  Administered 2019-04-10 (×2): 25 g via INTRAVENOUS
  Filled 2019-04-10 (×3): qty 100

## 2019-04-10 MED ORDER — SODIUM CHLORIDE 0.9 % IV SOLN
600.0000 mg | INTRAVENOUS | Status: DC
  Administered 2019-04-10: 600 mg via INTRAVENOUS
  Filled 2019-04-10 (×2): qty 12

## 2019-04-10 MED ORDER — APIXABAN 2.5 MG PO TABS
2.5000 mg | ORAL_TABLET | Freq: Two times a day (BID) | ORAL | Status: DC
  Administered 2019-04-10 – 2019-04-14 (×9): 2.5 mg via ORAL
  Filled 2019-04-10 (×9): qty 1

## 2019-04-10 MED ORDER — ENSURE ENLIVE PO LIQD
237.0000 mL | Freq: Two times a day (BID) | ORAL | Status: DC
  Administered 2019-04-11 – 2019-04-13 (×2): 237 mL via ORAL

## 2019-04-10 MED ORDER — FUROSEMIDE 10 MG/ML IJ SOLN
20.0000 mg | Freq: Two times a day (BID) | INTRAMUSCULAR | Status: DC
  Administered 2019-04-10: 20 mg via INTRAVENOUS
  Filled 2019-04-10: qty 4

## 2019-04-10 NOTE — Progress Notes (Signed)
[place] for Eliquis (apixiban)  Indication: atrial fibrillation  Allergies  Allergen Reactions  . Doxycycline Diarrhea and Nausea Only  . Methotrexate Rash    Patient Measurements: Height: 5\' 5"  (165.1 cm) Weight: 101.1 kg (222 lb 14.2 oz) IBW/kg (Calculated) : 57 Heparin Dosing Weight: 73.7 kg   Vital Signs: Temp: 97.7 F (36.5 C) (04/05 0818) Temp Source: Oral (04/05 0818) BP: 122/79 (04/05 0818) Pulse Rate: 94 (04/05 0818)  Labs: Recent Labs    04/08/19 0529 04/08/19 1326 04/09/19 0500 04/09/19 0500 04/09/19 0515 04/09/19 1422 04/10/19 0039 04/10/19 0452  HGB 7.6*  --  7.4*   < >  --   --  7.9* 9.1*  HCT 23.4*  --  22.5*  --   --   --  23.5* 28.2*  PLT 101*  --  104*  --   --   --   --  119*  HEPARINUNFRC 0.45   < > 0.74*  --   --  0.67  --  0.49  CREATININE 2.69*  --   --   --  2.56*  --   --  2.47*  CKTOTAL  --   --   --   --   --   --   --  30*   < > = values in this interval not displayed.    Estimated Creatinine Clearance: 20.7 mL/min (A) (by C-G formula based on SCr of 2.47 mg/dL (H)).   Medical History: Past Medical History:  Diagnosis Date  . Acute bronchitis   . Allergic rhinitis   . Arthritis   . CAD (coronary artery disease)    s/p stent LAD 8/03 and stent placement OM1  . Cancer ([place])    skin  . Chronic back pain   . COPD with asthma ([place])   . CVA (cerebral vascular accident) ([place])   . Diabetes mellitus ([place])   . Hypercholesterolemia   . Hypertension   . Seizure disorder ([place])     Medications:  Medications Prior to Admission  Medication Sig Dispense Refill Last Dose  . albuterol (PROVENTIL) (2.5 MG/3ML) 0.083% nebulizer solution USE ONE VIAL VIA NEBULZIER EVERY FOUR HOURS AS NEEDED FOR WHEEZING 540 mL 1 Unknown at PRN  . albuterol (VENTOLIN HFA) 108 (90 Base) MCG/ACT inhaler USE 2 PUFFS EVERY 4 HOURS AS DIRECTED - RESCUE (Patient taking differently: Inhale 2 puffs into the lungs every 4 (four)  hours as needed for wheezing or shortness of breath. ) 18 g 1 Unknown at PRN  . budesonide-formoterol (SYMBICORT) 80-4.5 MCG/ACT inhaler INHALE 2 PUFFS TWICE A DAY (Patient taking differently: Inhale 2 puffs into the lungs 2 (two) times daily. ) 10.2 g 3 04/06/2019 at 1100  . dabigatran (PRADAXA) 150 MG CAPS Take 150 mg by mouth every 12 (twelve) hours.   04/06/2019 at 1100  . gabapentin (NEURONTIN) 100 MG capsule TAKE ONE CAPSULE BY MOUTH TWICE A DAY (Patient taking differently: Take 200 mg by mouth at bedtime. ) 60 capsule 1 04/05/2019 at 2100  . levETIRAcetam (KEPPRA) 750 MG tablet Take 1 tablet (750 mg total) by mouth 2 (two) times daily. 180 tablet 0 04/06/2019 at 1100  . metoprolol succinate (TOPROL-XL) 25 MG 24 hr tablet Take 1 tablet (25 mg total) by mouth daily. 30 tablet 0 04/06/2019 at 1100  . Omega-3 Fatty Acids (FISH OIL) 1000 MG CAPS Take 1,000 mg by mouth daily.      . phenytoin (DILANTIN) 100 MG ER capsule  Take 4 capsules (400 mg total) by mouth at bedtime. 360 capsule 0 04/05/2019 at 2100  . polyethylene glycol (MIRALAX / GLYCOLAX) 17 g packet Take 17 g by mouth daily as needed for mild constipation or moderate constipation.    Unknown at PRN  . potassium chloride (KLOR-CON) 10 MEQ tablet Take 1 tablet (10 mEq total) by mouth daily. 90 tablet 1 04/06/2019 at 1100  . rosuvastatin (CRESTOR) 20 MG tablet TAKE 1 TABLET BY MOUTH DAILY (Patient taking differently: Take 20 mg by mouth daily. ) 30 tablet 5 04/05/2019 at Unknown time  . sertraline (ZOLOFT) 50 MG tablet Take 75 mg by mouth at bedtime.    04/05/2019 at 2100  . vancomycin IVPB Inject 750 mg into the vein daily for 21 days. 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 Labs - Thursday:  BMP, CRP and vancomycin trough 21 Units 0     Assessment: Pharmacy consulted to dose heparin for Afib in this 85 year old female admitted with ARF.  Pt was  on Pradaxa 150 mg PO BID at home, last dose was on 4/1 @ 1100.   Patient has been on a heparin drip during admission, most recently therapeutic with AM labs @ 0.49  Pharmacy has been consulted to stop heparin drip and start Eliquis (renally adjusted).   Plan:  Age > 48, Scr 2.47 - will start Eliquis 2.5mg  bid   First dose with discontinuation of heparin drip (communicated with nursing)  Will follow CBC/Scr a minimum of every 3 days per protocol.   Lu Duffel, PharmD, BCPS Clinical Pharmacist 04/10/2019 2:10 PM

## 2019-04-10 NOTE — Progress Notes (Signed)
Date of Admission:  04/06/2019     ID: Brooke Baker is a 85 y.o. female Principal Problem:   Acute renal failure (ARF) ([place]) Active Problems:   History of CVA (cerebrovascular accident)   Diabetes mellitus ([place])   Hypertension associated with diabetes ([place])   Seizure disorder ([place])   AF (paroxysmal atrial fibrillation) ([place])   Hyperlipidemia associated with type 2 diabetes mellitus ([place])   Chronic diastolic CHF (congestive heart failure) ([facility])   ILD (interstitial lung disease) (H[facility])   Thrombocytopenia ([facility])   Macrocytic anemia   Chronic respiratory failure ([place])   MRSA bacteremia    Subjective: Has Some shortness of breath Poor appetite   Medications:  . Chlorhexidine Gluconate Cloth  6 each Topical Q0600  . levETIRAcetam  500 mg Oral BID  . metoprolol succinate  25 mg Oral Daily  . mometasone-formoterol  2 puff Inhalation BID  . phenytoin  300 mg Oral QHS   And  . phenytoin  50 mg Oral QHS  . rosuvastatin  10 mg Oral Daily  . sertraline  75 mg Oral QHS  . sodium chloride flush  3 mL Intravenous Q12H    Objective: Vital signs in last 24 hours: Temp:  [97.7 F (36.5 C)-97.8 F (36.6 C)] 97.7 F (36.5 C) (04/05 0818) Pulse Rate:  [52-134] 94 (04/05 0818) Resp:  [16-20] 20 (04/05 0818) BP: (90-122)/(46-79) 122/79 (04/05 0818) SpO2:  [94 %-100 %] 97 % (04/05 1045) Weight:  [101.1 kg] 101.1 kg (04/05 0500)  PHYSICAL EXAM:  General: Alert, more talkative Head: Normocephalic, without obvious abnormality, atraumatic. Eyes: bruising over let orbit ENT Nares normal. No drainage or sinus tenderness. Lips, mucosa, and tongue normal. No Thrush Neck: Supple, symmetrical, no adenopathy, thyroid: non tender no carotid bruit and no JVD. Lungs: b/l air entry- crepts bases Heart: irregular Abdomen: Soft, non-tender,not distended. Bowel sounds normal. No masses Extremities:edmea legs Rt upper extremity edema Left- PIC Skin: dry scaly patches Lymph: Cervical,  supraclavicular normal. Neurologic: Grossly non-focal  Lab Results Recent Labs    04/08/19 0529 04/09/19 0500 04/09/19 0500 04/09/19 0515 04/10/19 0039 04/10/19 0452  WBC  --  6.2  --   --   --  7.9  HGB  --  7.4*   < >  --  7.9* 9.1*  HCT  --  22.5*   < >  --  23.5* 28.2*  NA   < >  --   --  134*  --  136  K   < >  --   --  4.2  --  4.3  CL   < >  --   --  103  --  105  CO2   < >  --   --  26  --  27  BUN   < >  --   --  44*  --  42*  CREATININE   < >  --   --  2.56*  --  2.47*   < > = values in this interval not displayed.   Liver Panel No results for input(s): PROT, ALBUMIN, AST, ALT, ALKPHOS, BILITOT, BILIDIR, IBILI in the last 72 hours. Sedimentation Rate No results for input(s): ESRSEDRATE in the last 72 hours. C-Reactive Protein No results for input(s): CRP in the last 72 hours.  Microbiology: 4/2 BC- NG  Studies/Results: No results found.   Assessment/Plan: AKI- combination of meds likely causing this- vanco with frusemide and ACEI. Also had contrast and NSAID- seen by  nephrologist- appreciate their input  Recent MRSA bacteremia- unclear source( possible skin as she has psoriasis- neg TEE) she was discharged on vanco until 04/15/19 As VAnco level 22 an dbecause of AKI- will not give it further. Check vanco level everyday and once it reaches 15, daptomycin can be started to complete the course. Currently no antibiotic needed as vanco is supra therapeutic ! Will hold rosuvastatin once dapto is started  Afib on metoprolol  Anemia  Thrombocytopenia  CHF- edema legs- back on  frusemide  Discussed the management with daughter and patient

## 2019-04-10 NOTE — Progress Notes (Signed)
Pharmacy Antibiotic Note  Brooke Baker is a 85 y.o. female admitted on 04/06/2019 with bacteremia.  Pharmacy has been consulted for daptomycin dosing. Patient started on vancomycin for MRSA bacteremia on 3/14 blood cultures.  She has h/o TKA, THA, and other hardware.  She developed AKI while on vancomycin.  Vancomycin has been held and checking random levels in order to start daptomycin  Today, 04/10/2019  Vancomycin random level = 16 mcg/ml  SCr slowly improving but CrCL remains < 45ml/min  CK = 30  Planned stop date for vancomycin was 4/10  Plan:  daptomycin 600mg  IV q48h (8mg /kg per Adjusted BW as she is >20% of her IBW).    Rosuvastatin held while on daptomycin  Follow-up stop date, if remains 4/10 then likely no need to recheck CK  Height: 5\' 5"  (165.1 cm) Weight: 101.1 kg (222 lb 14.2 oz) IBW/kg (Calculated) : 57  Temp (24hrs), Avg:97.8 F (36.6 C), Min:97.7 F (36.5 C), Max:97.8 F (36.6 C)  Recent Labs  Lab 04/06/19 1533 04/06/19 1533 04/07/19 0445 04/07/19 0445 04/07/19 1721 04/08/19 0529 04/08/19 0529 04/09/19 0500 04/09/19 0515 04/10/19 0452  WBC 7.1   < > 5.7  --  5.5 5.8  --  6.2  --  7.9  CREATININE 3.07*  --  2.79*  --   --  2.69*  --   --  2.56* 2.47*  VANCORANDOM 22   < > 22   < >  --  24   < > 21  --  16   < > = values in this interval not displayed.    Estimated Creatinine Clearance: 20.7 mL/min (A) (by C-G formula based on SCr of 2.47 mg/dL (H)).    Allergies  Allergen Reactions  . Doxycycline Diarrhea and Nausea Only  . Methotrexate Rash     Thank you for allowing pharmacy to be a part of this patient's care.  Doreene Eland, PharmD, BCPS.   Work Cell: [phone] 04/10/2019 11:24 AM

## 2019-04-10 NOTE — Progress Notes (Addendum)
PROGRESS NOTE    Brooke Baker  PPI:[id number] DOB: 14-Jul-1934 DOA: 04/06/2019 PCP: Einar Pheasant, MD   Brief Narrative:  85 year old female with COPD/interstitial lung disease with chronic hypoxic respiratory failure on 2 L home O2, CAD s/p PCI, paroxysmal A. fib on Pradaxa, chronic diastolic CHF, history of CVA, type 2 diabetes mellitus, hypertension, hyperlipidemia, thrombocytopenia, seizure disorder and depression who was recently hospitalized and discharged 1 week back secondary to MRSA bacteremia with pneumonia.  She was started on IV vancomycin and plan to treat for 4-week course through a left arm PICC line. She had follow-up labs done as outpatient and found to have acute kidney injury and was told to hold the antibiotic.  At baseline patient has chronic lower extremity swelling/puffiness. Also following hospital discharge patient had an episode of nausea with vomiting and poor p.o. intake.  She was taking ibuprofen as needed for pain for almost 3 times a day recently. Daughter noticed that patient has been increasingly fatigued and sleepy last few days with some confusion. Admitted for further management of her ATN, which was thought to be due to vancomycin.  Subjective: Patient was complaining of some shortness of breath, No chest pain.  No nausea or vomiting.  Patient uses 3 to 4 L at home all the time.  Assessment & Plan:   Principal Problem:   Acute renal failure (ARF) (HCC) Active Problems:   History of CVA (cerebrovascular accident)   Diabetes mellitus ([place])   Hypertension associated with diabetes ([place])   Seizure disorder (HCC)   AF (paroxysmal atrial fibrillation) ([place])   Hyperlipidemia associated with type 2 diabetes mellitus (HCC)   Chronic diastolic CHF (congestive heart failure) (HCC)   ILD (interstitial lung disease) (HCC)   Thrombocytopenia (HCC)   Macrocytic anemia   Chronic respiratory failure (HCC)   MRSA bacteremia  Acute tubular necrosis Suspect  vancomycin nephrotoxicity + prerenal ATN?,  Received contrast on 03/26/19.Also taking NSAIDs at home.  UA unremarkable.  FENa of 0.3%. Renal function slowly improving. Continue strict I's/O.  Pt is incontinent and difficult to monitor output closely while on purewick. -Vancomycin was discontinued.  Trough improving to 12 today. -Nephrology is following-appreciate their recommendations. -Monitor renal functions.  MRSA bacteremia Recent hospitalization with plan on 4 weeks IV vancomycin.  TEE was negative for vegetation.  Vancomycin trough decreased to 12 today. -She was started on daptomycin to finish course till 04/15/2019.  Chronic diastolic CHF Recent EF of 55-60% on TEE.  Patient appears little volume up today.  With dyspnea, decreased breath sound at bases, has chronic lower extremity edema with lymphedema and chest x-ray obtained today due to worsening dyspnea with worsening of pulmonary infiltrates/pulmonary edema. -She will be started on Lasix IV 20 mg BID along with IV albumin for gentle diuresis, patient had nephrology help with that.  CAD with history of PCI On Toprol.  Reduce dose of statin given AKI.  No chest pain symptoms.  Macrocytic anemia/thrombocytopenia.  Remained stable. S/P 1 unit of PRBC yesterday.  Hemoglobin improved to 9.1 from 7.9.  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  Hematology planning on bone marrow biopsy as outpatient. -Continue to monitor and transfuse as needed for hemoglobin below 7 or if patient is symptomatic.  Paroxysmal A. Fib.  Heart rate in 90s.  Toprol was held for 2 days due to hypotension which was resumed yesterday. She was on Heparin infusion and Pradaxa was held due to ATN. -Discontinue heparin infusion  and start her on renally dosed Eliquis. -Continue Toprol.  Hypertension.  Home dose of Lasix and metoprolol was held due to softer blood pressure yesterday.  Patient did receive some  intermittent IV fluid boluses due to tachycardia.  Blood pressure within goal today.  Appears little volume up. -Resume home dose of metoprolol. -Gentle diuresis with IV Lasix.  Seizure disorder Continue phenytoin.  Keppra dose reduced given AKI.  Diabetes mellitus type 2, diet controlled Stable.  Objective: Vitals:   04/09/19 1939 04/10/19 0500 04/10/19 0818 04/10/19 1045  BP: 109/60  122/79   Pulse: 99  94   Resp: 16  20   Temp: 97.8 F (36.6 C)  97.7 F (36.5 C)   TempSrc: Oral  Oral   SpO2: 100%  100% 97%  Weight:  101.1 kg    Height:        Intake/Output Summary (Last 24 hours) at 04/10/2019 1340 Last data filed at 04/10/2019 0950 Gross per 24 hour  Intake 1025.67 ml  Output --  Net 1025.67 ml   Filed Weights   04/08/19 0500 04/09/19 0445 04/10/19 0500  Weight: 99.2 kg 100.4 kg 101.1 kg    Examination:  General exam: Appears calm and comfortable, becoming dyspneic with just rolling over. Respiratory system: Decreased breath sounds at bases, respiratory effort normal. Cardiovascular system: S1 & S2 heard, RRR. No JVD, murmurs, rubs, gallops or clicks. Gastrointestinal system: Soft, nontender, nondistended, bowel sounds positive. Central nervous system: Alert and oriented. No focal neurological deficits.Symmetric 5 x 5 power. Extremities: 1+LE edema with signs of chronic venous stasis dermatitis and some element of lymphedema, no cyanosis, pulses intact and symmetrical. Psychiatry: Judgement and insight appear normal. Mood & affect appropriate.    DVT prophylaxis: Eliquis Code Status: Full Family Communication: Call daughter with no response, later she called back and was given update. Disposition Plan: Pending improvement.  Patient still with ATN, appears little volume up today, started on low-dose IV Lasix with IV albumin.  Consultants:   Nephrology  ID  Procedures:  Renal ultrasound  Antimicrobials:  Daptomycin  Data Reviewed: I have personally  reviewed following labs and imaging studies  CBC: Recent Labs  Lab 04/07/19 0445 04/07/19 0445 04/07/19 1721 04/08/19 0529 04/09/19 0500 04/10/19 0039 04/10/19 0452  WBC 5.7  --  5.5 5.8 6.2  --  7.9  HGB 7.7*   < > 8.3* 7.6* 7.4* 7.9* 9.1*  HCT 24.2*   < > 26.1* 23.4* 22.5* 23.5* 28.2*  MCV 109.0*  --  109.7* 108.8* 108.2*  --  103.7*  PLT 100*  --  102* 101* 104*  --  119*   < > = values in this interval not displayed.   Basic Metabolic Panel: Recent Labs  Lab 04/06/19 1533 04/07/19 0445 04/08/19 0529 04/09/19 0515 04/10/19 0452  NA 131* 135 135 134* 136  K 4.9 5.2* 4.9 4.2 4.3  CL 99 102 103 103 105  CO2 _0 GLUCOSE 133* 104* 103* 105* 102*  BUN 46* 47* 46* 44* 42*  CREATININE 3.07* 2.79* 2.69* 2.56* 2.47*  CALCIUM 7.4* 7.4* 7.5* 7.4* 8.0*  MG  --  1.8  --   --   --   PHOS  --  4.1  --   --   --    GFR: Estimated Creatinine Clearance: 20.7 mL/min (A) (by C-G formula based on SCr of 2.47 mg/dL (H)). Liver Function Tests: Recent Labs  Lab 04/06/19 1533  AST 43*  ALT  14  ALKPHOS 103  BILITOT 1.4*  PROT 7.0  ALBUMIN 1.9*   No results for input(s): LIPASE, AMYLASE in the last 168 hours. No results for input(s): AMMONIA in the last 168 hours. Coagulation Profile: Recent Labs  Lab 04/06/19 2014  INR 2.2*   Cardiac Enzymes: Recent Labs  Lab 04/10/19 0452  CKTOTAL 30*   BNP (last 3 results) No results for input(s): PROBNP in the last 8760 hours. HbA1C: No results for input(s): HGBA1C in the last 72 hours. CBG: Recent Labs  Lab 04/09/19 1152  GLUCAP 103*   Lipid Profile: No results for input(s): CHOL, HDL, LDLCALC, TRIG, CHOLHDL, LDLDIRECT in the last 72 hours. Thyroid Function Tests: No results for input(s): TSH, T4TOTAL, FREET4, T3FREE, THYROIDAB in the last 72 hours. Anemia Panel: No results for input(s): VITAMINB12, FOLATE, FERRITIN, TIBC, IRON, RETICCTPCT in the last 72 hours. Sepsis Labs: No results for input(s):  PROCALCITON, LATICACIDVEN in the last 168 hours.  Recent Results (from the past 240 hour(s))  SARS CORONAVIRUS 2 (TAT 6-24 HRS) Nasopharyngeal Nasopharyngeal Swab     Status: None   Collection Time: 04/06/19 11:32 PM   Specimen: Nasopharyngeal Swab  Result Value Ref Range Status   SARS Coronavirus 2 NEGATIVE NEGATIVE Final    Comment: (NOTE) SARS-CoV-2 target nucleic acids are NOT DETECTED. The SARS-CoV-2 RNA is generally detectable in upper and lower respiratory specimens during the acute phase of infection. Negative results do not preclude SARS-CoV-2 infection, do not rule out co-infections with other pathogens, and should not be used as the sole basis for treatment or other patient management decisions. Negative results must be combined with clinical observations, patient history, and epidemiological information. The expected result is Negative. Fact Sheet for Patients: https://www.fda.gov/media/138098/download Fact Sheet for Healthcare Providers: https://www.fda.gov/media/138095/download This test is not yet approved or cleared by the [place] FDA and  has been authorized for detection and/or diagnosis of SARS-CoV-2 by FDA under an Emergency Use Authorization (EUA). This EUA will remain  in effect (meaning this test can be used) for the duration of the COVID-19 declaration under Section 56 4(b)(1) of the Act, 21 U.S.C. section 360bbb-3(b)(1), unless the authorization is terminated or revoked sooner. Performed at [facility], [address] [place], [place] [address]   CULTURE, BLOOD (ROUTINE X 2) w Reflex to ID Panel     Status: None (Preliminary result)   Collection Time: 04/07/19  3:54 PM   Specimen: BLOOD  Result Value Ref Range Status   Specimen Description BLOOD BLOOD RIGHT HAND  Final   Special Requests AEROBIC BOTTLE ONLY Blood Culture adequate volume  Final   Culture   Final    NO GROWTH 3 DAYS Performed at [facility], [address]  [place], [place] [address]    Report Status PENDING  Incomplete  CULTURE, BLOOD (ROUTINE X 2) w Reflex to ID Panel     Status: None (Preliminary result)   Collection Time: 04/07/19  5:21 PM   Specimen: BLOOD  Result Value Ref Range Status   Specimen Description BLOOD BLOOD RIGHT HAND  Final   Special Requests   Final    BOTTLES DRAWN AEROBIC AND ANAEROBIC Blood Culture adequate volume   Culture   Final    NO GROWTH 3 DAYS Performed at [facility], [address] [place], [place] [address]    Report Status PENDING  Incomplete     Radiology Studies: DG Chest Port 1 View  Result Date: 04/10/2019 CLINICAL DATA:    Shortness of breath. EXAM: PORTABLE CHEST 1 VIEW COMPARISON:  04/06/2019 FINDINGS: 1152 hours. Lungs appear hyperexpanded. Interval progression of the diffuse interstitial and patchy right greater than left airspace disease. Cardiopericardial silhouette is at upper limits of normal for size. Left PICC line tip overlies the mid to distal SVC. Bones are diffusely demineralized. IMPRESSION: 1. Worsening diffuse interstitial and patchy right greater than left airspace disease. Asymmetric pulmonary edema versus diffuse infection. Electronically Signed   By: Misty Stanley M.D.   On: 04/10/2019 12:08    Scheduled Meds: . Chlorhexidine Gluconate Cloth  6 each Topical Q0600  . levETIRAcetam  500 mg Oral BID  . metoprolol succinate  25 mg Oral Daily  . mometasone-formoterol  2 puff Inhalation BID  . phenytoin  300 mg Oral QHS   And  . phenytoin  50 mg Oral QHS  . sertraline  75 mg Oral QHS  . sodium chloride flush  3 mL Intravenous Q12H   Continuous Infusions: . DAPTOmycin (CUBICIN)  IV    . heparin 1,350 Units/hr (04/10/19 0210)     LOS: 4 days   Time spent: 45 minutes.  Personally reviewed her chart  Lorella Nimrod, MD [facility]  If 7PM-7AM, please contact night-coverage [email or web address]  04/10/2019, 1:40 PM   This record has been created using [employer], [employer]. Errors have been sought and corrected,but may not always be located. Such creation errors do not reflect on the standard of care.

## 2019-04-10 NOTE — Progress Notes (Signed)
[facility]  ROUNDING NOTE   Subjective:   Creatinine 2.47 (2.56) (2.69) (2.79) Complains of some shortness of breath  Vanco 16.   PRBC transfusion yesterday  Objective:  Vital signs in last 24 hours:  Temp:  [97.7 F (36.5 C)-97.8 F (36.6 C)] 97.7 F (36.5 C) (04/05 0818) Pulse Rate:  [52-124] 94 (04/05 0818) Resp:  [16-20] 20 (04/05 0818) BP: (94-122)/(46-79) 122/79 (04/05 0818) SpO2:  [94 %-100 %] 97 % (04/05 1045) Weight:  [101.1 kg] 101.1 kg (04/05 0500)  Weight change: 0.7 kg Filed Weights   04/08/19 0500 04/09/19 0445 04/10/19 0500  Weight: 99.2 kg 100.4 kg 101.1 kg    Intake/Output: I/O last 3 completed shifts: In: 1421.8 [P.O.:240; I.V.:851.8; Blood:330] Out: -    Intake/Output this shift:  Total I/O In: 120 [P.O.:120] Out: -   Physical Exam: General: NAD,   Head: Normocephalic, atraumatic. Moist oral mucosal membranes  Eyes: Anicteric, PERRL  Neck: Supple, trachea midline  Lungs:  Clear to auscultation  Heart: Regular rate and rhythm  Abdomen:  Soft, nontender,   Extremities:  + peripheral edema.  Neurologic: Nonfocal, moving all four extremities  Skin: No lesions        Basic Metabolic Panel: Recent Labs  Lab 04/06/19 1533 04/06/19 1533 04/07/19 0445 04/07/19 0445 04/08/19 0529 04/09/19 0515 04/10/19 0452  NA 131*  --  135  --  135 134* 136  K 4.9  --  5.2*  --  4.9 4.2 4.3  CL 99  --  102  --  103 103 105  CO2 27  --  27  --  25 26 27   GLUCOSE 133*  --  104*  --  103* 105* 102*  BUN 46*  --  47*  --  46* 44* 42*  CREATININE 3.07*  --  2.79*  --  2.69* 2.56* 2.47*  CALCIUM 7.4*   < > 7.4*   < > 7.5* 7.4* 8.0*  MG  --   --  1.8  --   --   --   --   PHOS  --   --  4.1  --   --   --   --    < > = values in this interval not displayed.    Liver Function Tests: Recent Labs  Lab 04/06/19 1533  AST 43*  ALT 14  ALKPHOS 103  BILITOT 1.4*  PROT 7.0  ALBUMIN 1.9*   No results for input(s): LIPASE, AMYLASE in the  last 168 hours. No results for input(s): AMMONIA in the last 168 hours.  CBC: Recent Labs  Lab 04/07/19 0445 04/07/19 0445 04/07/19 1721 04/08/19 0529 04/09/19 0500 04/10/19 0039 04/10/19 0452  WBC 5.7  --  5.5 5.8 6.2  --  7.9  HGB 7.7*   < > 8.3* 7.6* 7.4* 7.9* 9.1*  HCT 24.2*   < > 26.1* 23.4* 22.5* 23.5* 28.2*  MCV 109.0*  --  109.7* 108.8* 108.2*  --  103.7*  PLT 100*  --  102* 101* 104*  --  119*   < > = values in this interval not displayed.    Cardiac Enzymes: Recent Labs  Lab 04/10/19 0452  CKTOTAL 30*    BNP: Invalid input(s): POCBNP  CBG: Recent Labs  Lab 04/09/19 1152  GLUCAP 103*    Microbiology: Results for orders placed or performed during the hospital encounter of 04/06/19  SARS CORONAVIRUS 2 (TAT 6-24 HRS) Nasopharyngeal Nasopharyngeal Swab     Status:  None   Collection Time: 04/06/19 11:32 PM   Specimen: Nasopharyngeal Swab  Result Value Ref Range Status   SARS Coronavirus 2 NEGATIVE NEGATIVE Final    Comment: (NOTE) SARS-CoV-2 target nucleic acids are NOT DETECTED. The SARS-CoV-2 RNA is generally detectable in upper and lower respiratory specimens during the acute phase of infection. Negative results do not preclude SARS-CoV-2 infection, do not rule out co-infections with other pathogens, and should not be used as the sole basis for treatment or other patient management decisions. Negative results must be combined with clinical observations, patient history, and epidemiological information. The expected result is Negative. Fact Sheet for Patients: SugarRoll.be Fact Sheet for Healthcare Providers: https://www.woods-mathews.com/ This test is not yet approved or cleared by the [place] FDA and  has been authorized for detection and/or diagnosis of SARS-CoV-2 by FDA under an Emergency Use Authorization (EUA). This EUA will remain  in effect (meaning this test can be used) for the duration of  the COVID-19 declaration under Section 56 4(b)(1) of the Act, 21 U.S.C. section 360bbb-3(b)(1), unless the authorization is terminated or revoked sooner. Performed at [facility], [place] [address], [place], [place] [address]   CULTURE, BLOOD (ROUTINE X 2) w Reflex to ID Panel     Status: None (Preliminary result)   Collection Time: 04/07/19  3:54 PM   Specimen: BLOOD  Result Value Ref Range Status   Specimen Description BLOOD BLOOD RIGHT HAND  Final   Special Requests AEROBIC BOTTLE ONLY Blood Culture adequate volume  Final   Culture   Final    NO GROWTH 3 DAYS Performed at [facility], [address] [place], [place] [address]    Report Status PENDING  Incomplete  CULTURE, BLOOD (ROUTINE X 2) w Reflex to ID Panel     Status: None (Preliminary result)   Collection Time: 04/07/19  5:21 PM   Specimen: BLOOD  Result Value Ref Range Status   Specimen Description BLOOD BLOOD RIGHT HAND  Final   Special Requests   Final    BOTTLES DRAWN AEROBIC AND ANAEROBIC Blood Culture adequate volume   Culture   Final    NO GROWTH 3 DAYS Performed at [facility], [address] [place], [place] [address]    Report Status PENDING  Incomplete    Coagulation Studies: No results for input(s): LABPROT, INR in the last 72 hours.  Urinalysis: No results for input(s): COLORURINE, LABSPEC, PHURINE, GLUCOSEU, HGBUR, BILIRUBINUR, KETONESUR, PROTEINUR, UROBILINOGEN, NITRITE, LEUKOCYTESUR in the last 72 hours.  Invalid input(s): APPERANCEUR    Imaging: DG Chest Port 1 View  Result Date: 04/10/2019 CLINICAL DATA:  Shortness of breath. EXAM: PORTABLE CHEST 1 VIEW COMPARISON:  04/06/2019 FINDINGS: 1152 hours. Lungs appear hyperexpanded. Interval progression of the diffuse interstitial and patchy right greater than left airspace disease. Cardiopericardial silhouette is at upper limits of normal for size. Left PICC line tip overlies the mid to distal SVC. Bones are diffusely  demineralized. IMPRESSION: 1. Worsening diffuse interstitial and patchy right greater than left airspace disease. Asymmetric pulmonary edema versus diffuse infection. Electronically Signed   By: Misty Stanley M.D.   On: 04/10/2019 12:08     Medications:   . albumin human    . DAPTOmycin (CUBICIN)  IV     . apixaban  2.5 mg Oral BID  . Chlorhexidine Gluconate Cloth  6 each Topical Q0600  . furosemide  20 mg Intravenous BID  . levETIRAcetam  500 mg Oral BID  . metoprolol  succinate  25 mg Oral Daily  . mometasone-formoterol  2 puff Inhalation BID  . phenytoin  300 mg Oral QHS   And  . phenytoin  50 mg Oral QHS  . sertraline  75 mg Oral QHS  . sodium chloride flush  3 mL Intravenous Q12H   acetaminophen **OR** acetaminophen, albuterol, ondansetron **OR** ondansetron (ZOFRAN) IV  Assessment/ Plan:  Brooke Baker is a 85 y.o. white female with COPD, interstitial lung disease, coronary artery disease, atrial fibrillation, diastolic congestive heart failure, CVA, diabetes mellitus type 2, hypertension, hyperlipidemia, seizure disorder, thrombocytopenia, depression, who was admitted to [facility] on 04/06/2019 for Acute renal failure (ARF) ([place]) [N17.9] AKI (acute kidney injury) ([place]) [N17.9]  1.  Acute kidney injury with hyperkalemia.  Baseline creatinine 0.8 with normal range GFR.  Was on potassium chloride on admission Acute kidney injury secondary to ATN from vancomycin, ibuprofen and IV contrast exposure on 3/21.  Holding IV fluids at this time. Kidney function is improving.  No indication for dialysis at this time.   2. Bacteremia: MRSA. Vanc level this morning of 21. Holding vancomycin. May resume when vanco level less than 15 Currently on daptomycin - Appreciate ID input.  3. Hypoalbuminemia: secondary to poor nutritional status. Cause of her peripheral edema.  - IV albumin - IV furosemide.   4. Anemia with renal failure: hemoglobin 9.1, macrocytic. Status post PRBC  transfusion on 4/4 Iron studies, vitamin B12 and folate are at goal. SPEP/UPEP negative from 01/30/19.   5. Hypertension - metoprolol   LOS: 4 Brooke Baker 4/5/20212:16 PM

## 2019-04-10 NOTE — Progress Notes (Signed)
[place] for Heparin  Indication: atrial fibrillation  Allergies  Allergen Reactions  . Doxycycline Diarrhea and Nausea Only  . Methotrexate Rash    Patient Measurements: Height: 5\' 5"  (165.1 cm) Weight: 100.4 kg (221 lb 5.5 oz) IBW/kg (Calculated) : 57 Heparin Dosing Weight: 73.7 kg   Vital Signs: Temp: 97.8 F (36.6 C) (04/04 1939) Temp Source: Oral (04/04 1939) BP: 109/60 (04/04 1939) Pulse Rate: 99 (04/04 1939)  Labs: Recent Labs    04/08/19 0529 04/08/19 1326 04/09/19 0500 04/09/19 0500 04/09/19 0515 04/09/19 1422 04/10/19 0039 04/10/19 0452  HGB 7.6*  --  7.4*   < >  --   --  7.9* 9.1*  HCT 23.4*  --  22.5*  --   --   --  23.5* 28.2*  PLT 101*  --  104*  --   --   --   --  119*  HEPARINUNFRC 0.45   < > 0.74*  --   --  0.67  --  0.49  CREATININE 2.69*  --   --   --  2.56*  --   --   --    < > = values in this interval not displayed.    Estimated Creatinine Clearance: 19.9 mL/min (A) (by C-G formula based on SCr of 2.56 mg/dL (H)).   Medical History: Past Medical History:  Diagnosis Date  . Acute bronchitis   . Allergic rhinitis   . Arthritis   . CAD (coronary artery disease)    s/p stent LAD 8/03 and stent placement OM1  . Cancer ([place])    skin  . Chronic back pain   . COPD with asthma ([place])   . CVA (cerebral vascular accident) ([place])   . Diabetes mellitus ([place])   . Hypercholesterolemia   . Hypertension   . Seizure disorder ([place])     Medications:  Medications Prior to Admission  Medication Sig Dispense Refill Last Dose  . albuterol (PROVENTIL) (2.5 MG/3ML) 0.083% nebulizer solution USE ONE VIAL VIA NEBULZIER EVERY FOUR HOURS AS NEEDED FOR WHEEZING 540 mL 1 Unknown at PRN  . albuterol (VENTOLIN HFA) 108 (90 Base) MCG/ACT inhaler USE 2 PUFFS EVERY 4 HOURS AS DIRECTED - RESCUE (Patient taking differently: Inhale 2 puffs into the lungs every 4 (four) hours as needed for wheezing or shortness of breath. ) 18 g 1  Unknown at PRN  . budesonide-formoterol (SYMBICORT) 80-4.5 MCG/ACT inhaler INHALE 2 PUFFS TWICE A DAY (Patient taking differently: Inhale 2 puffs into the lungs 2 (two) times daily. ) 10.2 g 3 04/06/2019 at 1100  . dabigatran (PRADAXA) 150 MG CAPS Take 150 mg by mouth every 12 (twelve) hours.   04/06/2019 at 1100  . gabapentin (NEURONTIN) 100 MG capsule TAKE ONE CAPSULE BY MOUTH TWICE A DAY (Patient taking differently: Take 200 mg by mouth at bedtime. ) 60 capsule 1 04/05/2019 at 2100  . levETIRAcetam (KEPPRA) 750 MG tablet Take 1 tablet (750 mg total) by mouth 2 (two) times daily. 180 tablet 0 04/06/2019 at 1100  . metoprolol succinate (TOPROL-XL) 25 MG 24 hr tablet Take 1 tablet (25 mg total) by mouth daily. 30 tablet 0 04/06/2019 at 1100  . Omega-3 Fatty Acids (FISH OIL) 1000 MG CAPS Take 1,000 mg by mouth daily.      . phenytoin (DILANTIN) 100 MG ER capsule Take 4 capsules (400 mg total) by mouth at bedtime. 360 capsule 0 04/05/2019 at 2100  . polyethylene glycol (MIRALAX / GLYCOLAX)  17 g packet Take 17 g by mouth daily as needed for mild constipation or moderate constipation.    Unknown at PRN  . potassium chloride (KLOR-CON) 10 MEQ tablet Take 1 tablet (10 mEq total) by mouth daily. 90 tablet 1 04/06/2019 at 1100  . rosuvastatin (CRESTOR) 20 MG tablet TAKE 1 TABLET BY MOUTH DAILY (Patient taking differently: Take 20 mg by mouth daily. ) 30 tablet 5 04/05/2019 at Unknown time  . sertraline (ZOLOFT) 50 MG tablet Take 75 mg by mouth at bedtime.    04/05/2019 at 2100  . vancomycin IVPB Inject 750 mg into the vein daily for 21 days. 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 Labs - Thursday:  BMP, CRP and vancomycin trough 21 Units 0     Assessment: Pharmacy consulted to dose heparin for Afib in this 85 year old female admitted with ARF.  Pt was on Pradaxa 150 mg PO BID at home, last dose was on 4/1 @ 1100.   CrC = 14.7 ml/min   -4/2 @ 1954 HL 0.15, subtherapeutic. Will increase drip rate to 1450 units/hr. No bolus in view of thrombocytopenia.  -Heparin level 8 hours after rate change -Daily CBC per protocol  0403 0529 HL 0.45, therapeutic x 1.  H/H worse, PLTs stable.  Continue Heparin drip at current rate and recheck HL in 8 hours to confirm.  4/3 @1326  HL= 0.54. will continue with current drip rate of 1450 units/hr. Will f/u HL with am labs. Hgb 7.6  Plt 101  0404 0500 HL 0.74, SUPRAtherapeutic but barely above goal.  CBC stable.  Will reduce Heparin drip to 1400 units/hr and recheck HL ~ 8 hours after rate decreased.   Goal of Therapy:  Heparin level 0.3-0.7 units/ml Monitor platelets by anticoagulation protocol: Yes   Plan:   4/4 @ 1422 HL= 0.67. Will sightly adjust Heparin drip to 1350 units/hr as near top of goal range. Will f/u with am labs  0405 0452 HL 0.49, therapeutic.  CBC stable.  Continue Heparin drip at current rate and f/u labs in am  Hart Robinsons A  04/10/2019,5:38 AM

## 2019-04-10 NOTE — Care Management Important Message (Signed)
Important Message  Patient Details  Name: Brooke Baker MRN: [id number] Date of Birth: January 20, 1934   Medicare Important Message Given:  Yes     [name] A Bevan Disney 04/10/2019, 10:58 AM

## 2019-04-11 ENCOUNTER — Inpatient Hospital Stay: Payer: Medicare PPO

## 2019-04-11 ENCOUNTER — Inpatient Hospital Stay: Payer: Medicare PPO | Admitting: Infectious Diseases

## 2019-04-11 LAB — PROTEIN ELECTRO, RANDOM URINE
Albumin ELP, Urine: 13.2 %
Alpha-1-Globulin, U: 7.4 %
Alpha-2-Globulin, U: 7.8 %
Beta Globulin, U: 37.7 %
Gamma Globulin, U: 33.9 %
M Component, Ur: 13.4 % — ABNORMAL HIGH
Total Protein, Urine: 75.1 mg/dL

## 2019-04-11 LAB — RENAL FUNCTION PANEL
Albumin: 2.9 g/dL — ABNORMAL LOW (ref 3.5–5.0)
Anion gap: 9 (ref 5–15)
BUN: 39 mg/dL — ABNORMAL HIGH (ref 8–23)
CO2: 25 mmol/L (ref 22–32)
Calcium: 8.2 mg/dL — ABNORMAL LOW (ref 8.9–10.3)
Chloride: 104 mmol/L (ref 98–111)
Creatinine, Ser: 2.31 mg/dL — ABNORMAL HIGH (ref 0.44–1.00)
GFR calc Af Amer: 22 mL/min — ABNORMAL LOW (ref >60)
GFR calc non Af Amer: 19 mL/min — ABNORMAL LOW (ref >60)
Glucose, Bld: 112 mg/dL — ABNORMAL HIGH (ref 70–99)
Phosphorus: 3.2 mg/dL (ref 2.5–4.6)
Potassium: 4 mmol/L (ref 3.5–5.1)
Sodium: 138 mmol/L (ref 135–145)

## 2019-04-11 LAB — CBC
HCT: 27.4 % — ABNORMAL LOW (ref 36.0–46.0)
Hemoglobin: 8.7 g/dL — ABNORMAL LOW (ref 12.0–15.0)
MCH: 33.5 pg (ref 26.0–34.0)
MCHC: 31.8 g/dL (ref 30.0–36.0)
MCV: 105.4 fL — ABNORMAL HIGH (ref 80.0–100.0)
Platelets: 108 10*3/uL — ABNORMAL LOW (ref 150–400)
RBC: 2.6 MIL/uL — ABNORMAL LOW (ref 3.87–5.11)
RDW: 20.4 % — ABNORMAL HIGH (ref 11.5–15.5)
WBC: 8.5 10*3/uL (ref 4.0–10.5)
nRBC: 0 % (ref 0.0–0.2)

## 2019-04-11 LAB — BLOOD GAS, ARTERIAL
Acid-Base Excess: 1.7 mmol/L (ref 0.0–2.0)
Bicarbonate: 26.6 mmol/L (ref 20.0–28.0)
FIO2: 0.36
O2 Saturation: 93.5 %
Patient temperature: 37
pCO2 arterial: 42 mmHg (ref 32.0–48.0)
pH, Arterial: 7.41 (ref 7.350–7.450)
pO2, Arterial: 68 mmHg — ABNORMAL LOW (ref 83.0–108.0)

## 2019-04-11 LAB — TROPONIN I (HIGH SENSITIVITY)
Troponin I (High Sensitivity): 17 ng/L (ref <18)
Troponin I (High Sensitivity): 17 ng/L (ref <18)

## 2019-04-11 MED ORDER — MORPHINE SULFATE (PF) 2 MG/ML IV SOLN: 2.0000 mg | INTRAVENOUS | Status: DC | PRN

## 2019-04-11 MED ORDER — IPRATROPIUM-ALBUTEROL 0.5-2.5 (3) MG/3ML IN SOLN
3.0000 mL | RESPIRATORY_TRACT | Status: DC | PRN
  Administered 2019-04-11 – 2019-04-12 (×2): 3 mL via RESPIRATORY_TRACT
  Filled 2019-04-11 (×2): qty 3

## 2019-04-11 MED ORDER — LORAZEPAM 2 MG/ML IJ SOLN
1.0000 mg | Freq: Once | INTRAMUSCULAR | Status: AC
  Administered 2019-04-11: 1 mg via INTRAVENOUS
  Filled 2019-04-11: qty 1

## 2019-04-11 MED ORDER — FUROSEMIDE 10 MG/ML IJ SOLN
40.0000 mg | Freq: Two times a day (BID) | INTRAMUSCULAR | Status: DC
  Administered 2019-04-11: 40 mg via INTRAVENOUS

## 2019-04-11 MED ORDER — FUROSEMIDE 10 MG/ML IJ SOLN
60.0000 mg | Freq: Two times a day (BID) | INTRAMUSCULAR | Status: DC
  Administered 2019-04-11 – 2019-04-14 (×5): 60 mg via INTRAVENOUS
  Filled 2019-04-11 (×5): qty 8

## 2019-04-11 MED ORDER — FUROSEMIDE 10 MG/ML IJ SOLN
INTRAMUSCULAR | Status: AC
  Filled 2019-04-11: qty 4

## 2019-04-11 MED ORDER — FUROSEMIDE 10 MG/ML IJ SOLN
40.0000 mg | Freq: Once | INTRAMUSCULAR | Status: AC
  Administered 2019-04-11: 40 mg via INTRAVENOUS
  Filled 2019-04-11: qty 4

## 2019-04-11 MED ORDER — MORPHINE SULFATE (PF) 2 MG/ML IV SOLN: 1.0000 mg | INTRAVENOUS | Status: DC | PRN

## 2019-04-11 NOTE — Progress Notes (Addendum)
PROGRESS NOTE    Brooke Baker  [id number] DOB: 02/19/1934 DOA: 04/06/2019 PCP: Einar Pheasant, MD   Brief Narrative:  85 year old female with COPD/interstitial lung disease with chronic hypoxic respiratory failure on 2 L home O2, CAD s/p PCI, paroxysmal A. fib on Pradaxa, chronic diastolic CHF, history of CVA, type 2 diabetes mellitus, hypertension, hyperlipidemia, thrombocytopenia, seizure disorder and depression who was recently hospitalized and discharged 1 week back secondary to MRSA bacteremia with pneumonia.  She was started on IV vancomycin and plan to treat for 4-week course through a left arm PICC line. She had follow-up labs done as outpatient and found to have acute kidney injury and was told to hold the antibiotic.  At baseline patient has chronic lower extremity swelling/puffiness. Also following hospital discharge patient had an episode of nausea with vomiting and poor p.o. intake.  She was taking ibuprofen as needed for pain for almost 3 times a day recently. Daughter noticed that patient has been increasingly fatigued and sleepy last few days with some confusion. Admitted for further management of her ATN, which was thought to be due to vancomycin.  Subjective: Patient was very tachypneic and short of breath when seen today.  Denies any chest pain.  Accompanied by her husband in the room.  Assessment & Plan:   Principal Problem:   AKI (acute kidney injury) ([place]) Active Problems:   History of CVA (cerebrovascular accident)   Diabetes mellitus ([place])   Hypertension associated with diabetes ([place])   Seizure disorder (H[facility])   AF (paroxysmal atrial fibrillation) ([place])   Hyperlipidemia associated with type 2 diabetes mellitus (H[facility])   Chronic diastolic CHF (congestive heart failure) (HCC)   ILD (interstitial lung disease) (HCC)   Thrombocytopenia (HCC)   Macrocytic anemia   Chronic respiratory failure (HCC)   MRSA bacteremia   SOB (shortness of breath)  Chronic  diastolic CHF Recent EF of 55-60% on TEE.  Patient with worsening tachypnea, dyspnea and bilateral crackles, has chronic lower extremity edema with lymphedema and chest x-ray obtained today due to worsening dyspnea with worsening of pulmonary infiltrates/pulmonary edema even when compared to yesterday.  Troponin remain negative. -Her Lasix dose was increased to 40 mg twice daily by nephrology this morning, Gave her an extra dose of Lasix.  She is not diuresing well.  Increased dose to 60 mg twice daily and we will obtain bladder scan to rule out any retention. -Order ReDS Readings.  Acute tubular necrosis Suspect vancomycin nephrotoxicity + prerenal ATN?,  Received contrast on 03/26/19.Also taking NSAIDs at home.  UA unremarkable.  FENa of 0.3%. Renal function slowly improving. Continue strict I's/O.  Pt is incontinent and difficult to monitor output closely while on purewick. -Vancomycin was discontinued.  Trough improving to 12 today. -Nephrology is following-appreciate their recommendations. -Monitor renal functions.  MRSA bacteremia Recent hospitalization with plan on 4 weeks IV vancomycin.  TEE was negative for vegetation.  Vancomycin trough decreased to 12 today. -Continue daptomycin to finish course till 04/15/2019.  CAD with history of PCI On Toprol.  Reduce dose of statin given AKI.  No chest pain symptoms.  Macrocytic anemia/thrombocytopenia.  Remained stable. S/P 1 unit of PRBC on 04/09/19.  Hemoglobin improved to 9.1 from 7.9, today it was 8.7.  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  Hematology planning on bone marrow biopsy as outpatient. -Continue to monitor and transfuse as needed for hemoglobin below 7 or if patient is symptomatic.  Paroxysmal A. Fib.  Heart rate elevated in low 100s.  Toprol was held for 2 days due to hypotension which was resumed. She was on Heparin infusion and Pradaxa was held due to ATN, heparin  was discontinued yesterday and she was started on Eliquis. -Continue Eliquis -Continue Toprol.  Hypertension.  Home dose of Lasix and metoprolol was held due to softer blood pressure yesterday.  Patient did receive some intermittent IV fluid boluses due to tachycardia.  Blood pressure within goal today.  Appears volume up. -Resume home dose of metoprolol. -Continue diuresis with IV Lasix-increased dose to 60 mg twice daily due to worsening pulmonary edema.  Seizure disorder Continue phenytoin.  Keppra dose reduced given AKI.  Diabetes mellitus type 2, diet controlled Stable.  Objective: Vitals:   04/11/19 0500 04/11/19 0850 04/11/19 1109 04/11/19 1309  BP:  (!) 141/71 (!) 155/88 122/72  Pulse:  (!) 103 (!) 101 (!) 106  Resp:   (!) 24 (!) 26  Temp:  97.8 F (36.6 C) (!) 97.3 F (36.3 C) (!) 97.5 F (36.4 C)  TempSrc:  Oral Oral Oral  SpO2:  96% 92%   Weight: 100.8 kg     Height:        Intake/Output Summary (Last 24 hours) at 04/11/2019 1359 Last data filed at 04/11/2019 0426 Gross per 24 hour  Intake 312.14 ml  Output 150 ml  Net 162.14 ml   Filed Weights   04/09/19 0445 04/10/19 0500 04/11/19 0500  Weight: 100.4 kg 101.1 kg 100.8 kg    Examination:  General exam: Patient was dyspneic and tachypneic, sitting upright. Respiratory system: Bilateral crackles with increased respiratory effort. Cardiovascular system: Tachycardia, no JVD, murmurs, rubs, gallops or clicks. Gastrointestinal system: Soft, nontender, nondistended, bowel sounds positive. Central nervous system: Alert and oriented. No focal neurological deficits. Extremities: 2+LE edema with signs of chronic venous stasis dermatitis and some element of lymphedema, no cyanosis, pulses intact and symmetrical. Psychiatry: Judgement and insight appear normal. Mood & affect appropriate.    DVT prophylaxis: Eliquis Code Status: Full Family Communication: Husband was updated at bedside. Disposition Plan: Pending  improvement.  Patient still with ATN, and now acute exacerbation of her chronic diastolic heart failure.  Consultants:   Nephrology  ID  Procedures:  Renal ultrasound  Antimicrobials:  Daptomycin  Data Reviewed: I have personally reviewed following labs and imaging studies  CBC: Recent Labs  Lab 04/07/19 1721 04/07/19 1721 04/08/19 0529 04/09/19 0500 04/10/19 0039 04/10/19 0452 04/11/19 1049  WBC 5.5  --  5.8 6.2  --  7.9 8.5  HGB 8.3*   < > 7.6* 7.4* 7.9* 9.1* 8.7*  HCT 26.1*   < > 23.4* 22.5* 23.5* 28.2* 27.4*  MCV 109.7*  --  108.8* 108.2*  --  103.7* 105.4*  PLT 102*  --  101* 104*  --  119* 108*   < > = values in this interval not displayed.   Basic Metabolic Panel: Recent Labs  Lab 04/07/19 0445 04/08/19 0529 04/09/19 0515 04/10/19 0452 04/11/19 1049  NA 135 135 134* 136 138  K 5.2* 4.9 4.2 4.3 4.0  CL 102 103 103 105 104  CO2 _0 GLUCOSE 104* 103* 105* 102* 112*  BUN 47* 46* 44* 42* 39*  CREATININE 2.79* 2.69* 2.56* 2.47* 2.31*  CALCIUM 7.4* 7.5* 7.4* 8.0* 8.2*  MG 1.8  --   --   --   --   PHOS 4.1  --   --   --  3.2  GFR: Estimated Creatinine Clearance: 22.1 mL/min (A) (by C-G formula based on SCr of 2.31 mg/dL (H)). Liver Function Tests: Recent Labs  Lab 04/06/19 1533 04/11/19 1049  AST 43*  --   ALT 14  --   ALKPHOS 103  --   BILITOT 1.4*  --   PROT 7.0  --   ALBUMIN 1.9* 2.9*   No results for input(s): LIPASE, AMYLASE in the last 168 hours. No results for input(s): AMMONIA in the last 168 hours. Coagulation Profile: Recent Labs  Lab 04/06/19 2014  INR 2.2*   Cardiac Enzymes: Recent Labs  Lab 04/10/19 0452  CKTOTAL 30*   BNP (last 3 results) No results for input(s): PROBNP in the last 8760 hours. HbA1C: No results for input(s): HGBA1C in the last 72 hours. CBG: Recent Labs  Lab 04/09/19 1152  GLUCAP 103*   Lipid Profile: No results for input(s): CHOL, HDL, LDLCALC, TRIG, CHOLHDL, LDLDIRECT in the last 72  hours. Thyroid Function Tests: No results for input(s): TSH, T4TOTAL, FREET4, T3FREE, THYROIDAB in the last 72 hours. Anemia Panel: No results for input(s): VITAMINB12, FOLATE, FERRITIN, TIBC, IRON, RETICCTPCT in the last 72 hours. Sepsis Labs: No results for input(s): PROCALCITON, LATICACIDVEN in the last 168 hours.  Recent Results (from the past 240 hour(s))  SARS CORONAVIRUS 2 (TAT 6-24 HRS) Nasopharyngeal Nasopharyngeal Swab     Status: None   Collection Time: 04/06/19 11:32 PM   Specimen: Nasopharyngeal Swab  Result Value Ref Range Status   SARS Coronavirus 2 NEGATIVE NEGATIVE Final    Comment: (NOTE) SARS-CoV-2 target nucleic acids are NOT DETECTED. The SARS-CoV-2 RNA is generally detectable in upper and lower respiratory specimens during the acute phase of infection. Negative results do not preclude SARS-CoV-2 infection, do not rule out co-infections with other pathogens, and should not be used as the sole basis for treatment or other patient management decisions. Negative results must be combined with clinical observations, patient history, and epidemiological information. The expected result is Negative. Fact Sheet for Patients: SugarRoll.be Fact Sheet for Healthcare Providers: https://www.woods-mathews.com/ This test is not yet approved or cleared by the [place] FDA and  has been authorized for detection and/or diagnosis of SARS-CoV-2 by FDA under an Emergency Use Authorization (EUA). This EUA will remain  in effect (meaning this test can be used) for the duration of the COVID-19 declaration under Section 56 4(b)(1) of the Act, 21 U.S.C. section 360bbb-3(b)(1), unless the authorization is terminated or revoked sooner. Performed at [facility], [place] [address], [place], [place] [address]   CULTURE, BLOOD (ROUTINE X 2) w Reflex to ID Panel     Status: None (Preliminary result)   Collection Time: 04/07/19  3:54 PM    Specimen: BLOOD  Result Value Ref Range Status   Specimen Description BLOOD BLOOD RIGHT HAND  Final   Special Requests AEROBIC BOTTLE ONLY Blood Culture adequate volume  Final   Culture   Final    NO GROWTH 4 DAYS Performed at [facility], [address] [place], [place] [address]    Report Status PENDING  Incomplete  CULTURE, BLOOD (ROUTINE X 2) w Reflex to ID Panel     Status: None (Preliminary result)   Collection Time: 04/07/19  5:21 PM   Specimen: BLOOD  Result Value Ref Range Status   Specimen Description BLOOD BLOOD RIGHT HAND  Final   Special Requests   Final    BOTTLES DRAWN AEROBIC AND ANAEROBIC Blood Culture adequate volume  Culture   Final    NO GROWTH 4 DAYS Performed at [facility], [address], [place], [place] [address]    Report Status PENDING  Incomplete     Radiology Studies: DG Chest [facility] 1 View  Result Date: 04/11/2019 CLINICAL DATA:  Shortness of breath. EXAM: PORTABLE CHEST 1 VIEW COMPARISON:  Single-view of the chest 04/10/2019, 03/25/2019. PA and lateral chest 04/20/2017 and 04/06/2019. FINDINGS: Left PICC is again seen. Cardiomegaly and bilateral airspace disease persist. Atherosclerosis noted. Aeration has worsened. Small pleural effusions have increased. Right effusion is larger. There is cardiomegaly. Atherosclerosis. IMPRESSION: Worsened bilateral airspace disease and right greater than left pleural effusions compatible with multifocal pneumonia or pulmonary edema. Atherosclerosis. Electronically Signed   By: Inge Rise M.D.   On: 04/11/2019 12:24   DG Chest Port 1 View  Result Date: 04/10/2019 CLINICAL DATA:  Shortness of breath. EXAM: PORTABLE CHEST 1 VIEW COMPARISON:  04/06/2019 FINDINGS: 1152 hours. Lungs appear hyperexpanded. Interval progression of the diffuse interstitial and patchy right greater than left airspace disease. Cardiopericardial silhouette is at upper limits of normal for size. Left PICC line tip overlies  the mid to distal SVC. Bones are diffusely demineralized. IMPRESSION: 1. Worsening diffuse interstitial and patchy right greater than left airspace disease. Asymmetric pulmonary edema versus diffuse infection. Electronically Signed   By: Misty Stanley M.D.   On: 04/10/2019 12:08    Scheduled Meds: . apixaban  2.5 mg Oral BID  . Chlorhexidine Gluconate Cloth  6 each Topical Q0600  . feeding supplement (ENSURE ENLIVE)  237 mL Oral BID BM  . furosemide      . furosemide  60 mg Intravenous BID  . levETIRAcetam  500 mg Oral BID  . metoprolol succinate  25 mg Oral Daily  . mometasone-formoterol  2 puff Inhalation BID  . phenytoin  300 mg Oral QHS   And  . phenytoin  50 mg Oral QHS  . sertraline  75 mg Oral QHS  . sodium chloride flush  3 mL Intravenous Q12H   Continuous Infusions: . DAPTOmycin (CUBICIN)  IV Stopped (04/10/19 2221)     LOS: 5 days   Time spent: 45 minutes.    Lorella Nimrod, MD [facility]  If 7PM-7AM, please contact night-coverage [email or web address]  04/11/2019, 1:59 PM   This record has been created using Systems analyst. Errors have been sought and corrected,but may not always be located. Such creation errors do not reflect on the standard of care.

## 2019-04-11 NOTE — Progress Notes (Signed)
Pt  Resting  A little more comfortably  At present . Cont  dyspnic at intevals with  Exertion  And talking.  resp cont to have  Mews in th  Yellow. And  Hr tacky at times. Diuresing well from lasix given today.  With less tacynenia. Denies pain. 02 at 4 l [place] now.

## 2019-04-11 NOTE — Progress Notes (Signed)
[facility]  ROUNDING NOTE   Subjective:   Creatinine 2.31 (2.47) (2.56) (2.69) (2.79) Complains of shortness of breath   Objective:  Vital signs in last 24 hours:  Temp:  [97.3 F (36.3 C)-98 F (36.7 C)] 97.5 F (36.4 C) (04/06 1309) Pulse Rate:  [86-108] 106 (04/06 1309) Resp:  [17-26] 26 (04/06 1309) BP: (104-155)/(51-88) 122/72 (04/06 1309) SpO2:  [92 %-98 %] 92 % (04/06 1109) Weight:  [100.8 kg] 100.8 kg (04/06 0500)  Weight change: -0.3 kg Filed Weights   04/09/19 0445 04/10/19 0500 04/11/19 0500  Weight: 100.4 kg 101.1 kg 100.8 kg    Intake/Output: I/O last 3 completed shifts: In: 874.3 [P.O.:120; I.V.:112.2; Blood:330; IV Piggyback:312.1] Out: 150 [Urine:150]   Intake/Output this shift:  No intake/output data recorded.  Physical Exam: General: NAD,   Head: Normocephalic, atraumatic. Moist oral mucosal membranes  Eyes: Anicteric, PERRL  Neck: Supple, trachea midline  Lungs:  Crackles at bases bilaterally  Heart: Regular rate and rhythm  Abdomen:  Soft, nontender,   Extremities:  + peripheral edema.  Neurologic: Nonfocal, moving all four extremities  Skin: No lesions        Basic Metabolic Panel: Recent Labs  Lab 04/07/19 0445 04/07/19 0445 04/08/19 0529 04/08/19 0529 04/09/19 0515 04/10/19 0452 04/11/19 1049  NA 135  --  135  --  134* 136 138  K 5.2*  --  4.9  --  4.2 4.3 4.0  CL 102  --  103  --  103 105 104  CO2 27  --  25  --  26 27 25   GLUCOSE 104*  --  103*  --  105* 102* 112*  BUN 47*  --  46*  --  44* 42* 39*  CREATININE 2.79*  --  2.69*  --  2.56* 2.47* 2.31*  CALCIUM 7.4*   < > 7.5*   < > 7.4* 8.0* 8.2*  MG 1.8  --   --   --   --   --   --   PHOS 4.1  --   --   --   --   --  3.2   < > = values in this interval not displayed.    Liver Function Tests: Recent Labs  Lab 04/06/19 1533 04/11/19 1049  AST 43*  --   ALT 14  --   ALKPHOS 103  --   BILITOT 1.4*  --   PROT 7.0  --   ALBUMIN 1.9* 2.9*   No results  for input(s): LIPASE, AMYLASE in the last 168 hours. No results for input(s): AMMONIA in the last 168 hours.  CBC: Recent Labs  Lab 04/07/19 1721 04/07/19 1721 04/08/19 0529 04/09/19 0500 04/10/19 0039 04/10/19 0452 04/11/19 1049  WBC 5.5  --  5.8 6.2  --  7.9 8.5  HGB 8.3*   < > 7.6* 7.4* 7.9* 9.1* 8.7*  HCT 26.1*   < > 23.4* 22.5* 23.5* 28.2* 27.4*  MCV 109.7*  --  108.8* 108.2*  --  103.7* 105.4*  PLT 102*  --  101* 104*  --  119* 108*   < > = values in this interval not displayed.    Cardiac Enzymes: Recent Labs  Lab 04/10/19 0452  CKTOTAL 30*    BNP: Invalid input(s): POCBNP  CBG: Recent Labs  Lab 04/09/19 1152  GLUCAP 103*    Microbiology: Results for orders placed or performed during the hospital encounter of 04/06/19  SARS CORONAVIRUS 2 (TAT 6-24 HRS)  Nasopharyngeal Nasopharyngeal Swab     Status: None   Collection Time: 04/06/19 11:32 PM   Specimen: Nasopharyngeal Swab  Result Value Ref Range Status   SARS Coronavirus 2 NEGATIVE NEGATIVE Final    Comment: (NOTE) SARS-CoV-2 target nucleic acids are NOT DETECTED. The SARS-CoV-2 RNA is generally detectable in upper and lower respiratory specimens during the acute phase of infection. Negative results do not preclude SARS-CoV-2 infection, do not rule out co-infections with other pathogens, and should not be used as the sole basis for treatment or other patient management decisions. Negative results must be combined with clinical observations, patient history, and epidemiological information. The expected result is Negative. Fact Sheet for Patients: SugarRoll.be Fact Sheet for Healthcare Providers: https://www.woods-mathews.com/ This test is not yet approved or cleared by the [place] FDA and  has been authorized for detection and/or diagnosis of SARS-CoV-2 by FDA under an Emergency Use Authorization (EUA). This EUA will remain  in effect (meaning this test  can be used) for the duration of the COVID-19 declaration under Section 56 4(b)(1) of the Act, 21 U.S.C. section 360bbb-3(b)(1), unless the authorization is terminated or revoked sooner. Performed at [facility], [place] [address], [place], [place] [address]   CULTURE, BLOOD (ROUTINE X 2) w Reflex to ID Panel     Status: None (Preliminary result)   Collection Time: 04/07/19  3:54 PM   Specimen: BLOOD  Result Value Ref Range Status   Specimen Description BLOOD BLOOD RIGHT HAND  Final   Special Requests AEROBIC BOTTLE ONLY Blood Culture adequate volume  Final   Culture   Final    NO GROWTH 4 DAYS Performed at [facility], [address], [place], [place] [address]    Report Status PENDING  Incomplete  CULTURE, BLOOD (ROUTINE X 2) w Reflex to ID Panel     Status: None (Preliminary result)   Collection Time: 04/07/19  5:21 PM   Specimen: BLOOD  Result Value Ref Range Status   Specimen Description BLOOD BLOOD RIGHT HAND  Final   Special Requests   Final    BOTTLES DRAWN AEROBIC AND ANAEROBIC Blood Culture adequate volume   Culture   Final    NO GROWTH 4 DAYS Performed at [facility], [address] [place], [place] [address]    Report Status PENDING  Incomplete    Coagulation Studies: No results for input(s): LABPROT, INR in the last 72 hours.  Urinalysis: No results for input(s): COLORURINE, LABSPEC, PHURINE, GLUCOSEU, HGBUR, BILIRUBINUR, KETONESUR, PROTEINUR, UROBILINOGEN, NITRITE, LEUKOCYTESUR in the last 72 hours.  Invalid input(s): APPERANCEUR    Imaging: DG Chest Port 1 View  Result Date: 04/11/2019 CLINICAL DATA:  Shortness of breath. EXAM: PORTABLE CHEST 1 VIEW COMPARISON:  Single-view of the chest 04/10/2019, 03/25/2019. PA and lateral chest 04/20/2017 and 04/06/2019. FINDINGS: Left PICC is again seen. Cardiomegaly and bilateral airspace disease persist. Atherosclerosis noted. Aeration has worsened. Small pleural effusions have increased.  Right effusion is larger. There is cardiomegaly. Atherosclerosis. IMPRESSION: Worsened bilateral airspace disease and right greater than left pleural effusions compatible with multifocal pneumonia or pulmonary edema. Atherosclerosis. Electronically Signed   By: Inge Rise M.D.   On: 04/11/2019 12:24   DG Chest Port 1 View  Result Date: 04/10/2019 CLINICAL DATA:  Shortness of breath. EXAM: PORTABLE CHEST 1 VIEW COMPARISON:  04/06/2019 FINDINGS: 1152 hours. Lungs appear hyperexpanded. Interval progression of the diffuse interstitial and patchy right greater than left airspace disease. Cardiopericardial silhouette is at upper limits  of normal for size. Left PICC line tip overlies the mid to distal SVC. Bones are diffusely demineralized. IMPRESSION: 1. Worsening diffuse interstitial and patchy right greater than left airspace disease. Asymmetric pulmonary edema versus diffuse infection. Electronically Signed   By: Misty Stanley M.D.   On: 04/10/2019 12:08     Medications:   . DAPTOmycin (CUBICIN)  IV Stopped (04/10/19 2221)   . apixaban  2.5 mg Oral BID  . Chlorhexidine Gluconate Cloth  6 each Topical Q0600  . feeding supplement (ENSURE ENLIVE)  237 mL Oral BID BM  . furosemide      . furosemide  60 mg Intravenous BID  . levETIRAcetam  500 mg Oral BID  . metoprolol succinate  25 mg Oral Daily  . mometasone-formoterol  2 puff Inhalation BID  . phenytoin  300 mg Oral QHS   And  . phenytoin  50 mg Oral QHS  . sertraline  75 mg Oral QHS  . sodium chloride flush  3 mL Intravenous Q12H   acetaminophen **OR** acetaminophen, albuterol, ipratropium-albuterol, ondansetron **OR** ondansetron (ZOFRAN) IV  Assessment/ Plan:  Ms. TISHAUNA NEALON is a 85 y.o. white female with COPD, interstitial lung disease, coronary artery disease, atrial fibrillation, diastolic congestive heart failure, CVA, diabetes mellitus type 2, hypertension, hyperlipidemia, seizure disorder, thrombocytopenia, depression, who  was admitted to [facility] on 04/06/2019 for Acute renal failure (ARF) ([place]) [N17.9] AKI (acute kidney injury) ([place]) [N17.9]  1.  Acute kidney injury with hyperkalemia.  Baseline creatinine 0.8 with normal range GFR.  Was on potassium chloride on admission Acute kidney injury secondary to ATN from vancomycin, ibuprofen and IV contrast exposure on 3/21.  Holding IV fluids at this time. Kidney function is improving.  No indication for dialysis at this time.   2. Bacteremia: MRSA. Vanc level this morning of 21. Holding vancomycin. May resume when vanco level less than 15 Currently on daptomycin - Appreciate ID input.  3. Hypoalbuminemia: secondary to poor nutritional status. Cause of her peripheral edema.  - IV albumin - IV furosemide.   4. Anemia with renal failure: hemoglobin 8.7, macrocytic. Status post PRBC transfusion on 4/4 Iron studies, vitamin B12 and folate are at goal. SPEP/UPEP negative from 01/30/19.   5. Hypertension - metoprolol and furosemide.    LOS: 5 Tremayne Sheldon 4/6/20212:59 PM

## 2019-04-11 NOTE — TOC Initial Note (Signed)
Transition of Care [facility]) - Initial/Assessment Note    Patient Details  Name: Brooke Baker MRN: [id number] Date of Birth: 03-11-1934  [facility] CM/SW Contact:    Su Hilt, RN Phone Number: 04/11/2019, 4:14 PM  Clinical Narrative:                 Met with the patient at the bedside  She is on Oxygen and struggling to breath with speaking I encouraged her to rest and not try talking but to answer with a shake or nod of her head She does have oxygen at home and uses 3.5 liters She is [facility] with [facility] and I called Helene Kelp to request her to be open with CHF protocol She will do so. She lioves at home with her husband and has RW, BSC at home Will continue to monitor for needs  Expected Discharge Plan: [place] Barriers to Discharge: Continued Medical Work up   Patient Goals and CMS Choice        Expected Discharge Plan and Services Expected Discharge Plan: [place]   Discharge Planning Services: CM Consult   Living arrangements for the past 2 months: Single Family Home                 DME Arranged: N/A         HH Arranged: RN, PT [place] Agency: [facility] [employer] [facility] [employer] Date [place]: 04/11/19 Time [place]: [phone] Representative spoke with at [place] Arrangements/Services Living arrangements for the past 2 months: [employer] [place] with:: Spouse Patient language and need for interpreter reviewed:: Yes Do you feel safe going back to the place where you live?: Yes      Need for Family Participation in Patient Care: No (Comment) Care giver support system in place?: Yes (comment) Current home services: DME(Home Oxygen, RW, BSC) Criminal Activity/Legal Involvement Pertinent to Current Situation/Hospitalization: No - Comment as needed  Activities of Daily Living Home Assistive Devices/Equipment: Walker (specify type)(4 wheels ) ADL  Screening (condition at time of admission) Patient's cognitive ability adequate to safely complete daily activities?: No Is the patient deaf or have difficulty hearing?: No Does the patient have difficulty seeing, even when wearing glasses/contacts?: Yes(glasses are at home) Does the patient have difficulty concentrating, remembering, or making decisions?: Yes Patient able to express need for assistance with ADLs?: Yes Does the patient have difficulty dressing or bathing?: Yes Independently performs ADLs?: No Communication: Independent Dressing (OT): Needs assistance Is this a change from baseline?: Pre-admission baseline Grooming: Independent Feeding: Independent Bathing: Needs assistance Is this a change from baseline?: Pre-admission baseline Toileting: Needs assistance Is this a change from baseline?: Pre-admission baseline In/Out Bed: Needs assistance Is this a change from baseline?: Pre-admission baseline Walks in Home: Dependent Is this a change from baseline?: Pre-admission baseline Does the patient have difficulty walking or climbing stairs?: Yes Weakness of Legs: Both Weakness of Arms/Hands: Both  Permission Sought/Granted   Permission granted to share information with : Yes, Verbal Permission Granted              Emotional Assessment Appearance:: Appears stated age Attitude/Demeanor/Rapport: Engaged Affect (typically observed): Accepting, Appropriate Orientation: : Oriented to Self, Oriented to Place, Oriented to  Time, Oriented to Situation Alcohol / Substance Use: Not Applicable Psych Involvement: No (comment)  Admission diagnosis:  Acute renal failure (ARF) (HCC) [N17.9] AKI (acute kidney  injury) [facility]) [N17.9] Patient Active Problem List   Diagnosis Date Noted  . SOB (shortness of breath)   . AKI (acute kidney injury) ([place]) 04/06/2019  . MRSA bacteremia 04/06/2019  . Community acquired pneumonia   . Sepsis ([place]) 03/19/2019  . Chronic respiratory failure  ([place]) 02/05/2019  . Macrocytic anemia 01/14/2019  . Skin lesion 11/13/2018  . Leg lesion 11/13/2018  . Cough 09/25/2018  . Fall 09/25/2018  . Thrombocytopenia ([place]) 09/11/2018  . ILD (interstitial lung disease) ([place]) 01/14/2018  . Chronic diastolic CHF (congestive heart failure) ([place]) 07/01/2017  . Therapeutic drug monitoring 03/25/2017  . BMI 39.0-39.9,adult 03/07/2017  . Falls frequently 03/18/2016  . Psoriatic arthritis ([place]) 05/14/2015  . Health care maintenance 10/31/2014  . Stress 12/03/2013  . Obesity 08/01/2013  . Neuropathy 08/01/2013  . Macrocytosis 08/01/2013  . Diabetes mellitus ([place]) 12/18/2011  . Hypertension associated with diabetes ([place]) 12/18/2011  . Chronic back pain 12/18/2011  . Seizure disorder ([place]) 12/18/2011  . AF (paroxysmal atrial fibrillation) ([place]) 12/18/2011  . Hyperlipidemia associated with type 2 diabetes mellitus ([place]) 12/18/2011  . Asthma, moderate persistent 04/08/2009  . Coronary atherosclerosis 04/19/2007  . History of CVA (cerebrovascular accident) 04/19/2007  . ASTHMATIC BRONCHITIS, ACUTE 04/19/2007  . Seasonal and perennial allergic rhinitis 04/19/2007   PCP:  Einar Pheasant, MD Pharmacy:   [facility], [place] [address] Phone: [phone] Fax: [place], [place] [address] [place] [address] Phone: [phone] Fax: [place], [place] - [place] [address] Phone: [phone] Fax: [phone]     Social Determinants of Health (SDOH) Interventions    Readmission Risk Interventions No flowsheet data found.

## 2019-04-12 ENCOUNTER — Inpatient Hospital Stay: Payer: Medicare PPO

## 2019-04-12 DIAGNOSIS — A4102 Sepsis due to Methicillin resistant Staphylococcus aureus: Secondary | ICD-10-CM | POA: Diagnosis not present

## 2019-04-12 DIAGNOSIS — J9601 Acute respiratory failure with hypoxia: Secondary | ICD-10-CM

## 2019-04-12 DIAGNOSIS — I110 Hypertensive heart disease with heart failure: Secondary | ICD-10-CM | POA: Diagnosis not present

## 2019-04-12 DIAGNOSIS — I5033 Acute on chronic diastolic (congestive) heart failure: Secondary | ICD-10-CM | POA: Diagnosis not present

## 2019-04-12 DIAGNOSIS — I50810 Right heart failure, unspecified: Secondary | ICD-10-CM | POA: Diagnosis not present

## 2019-04-12 DIAGNOSIS — B9562 Methicillin resistant Staphylococcus aureus infection as the cause of diseases classified elsewhere: Secondary | ICD-10-CM

## 2019-04-12 DIAGNOSIS — E1140 Type 2 diabetes mellitus with diabetic neuropathy, unspecified: Secondary | ICD-10-CM | POA: Diagnosis not present

## 2019-04-12 DIAGNOSIS — R7881 Bacteremia: Secondary | ICD-10-CM

## 2019-04-12 DIAGNOSIS — J440 Chronic obstructive pulmonary disease with acute lower respiratory infection: Secondary | ICD-10-CM | POA: Diagnosis not present

## 2019-04-12 DIAGNOSIS — J189 Pneumonia, unspecified organism: Secondary | ICD-10-CM | POA: Diagnosis not present

## 2019-04-12 LAB — RENAL FUNCTION PANEL
Albumin: 2.5 g/dL — ABNORMAL LOW (ref 3.5–5.0)
Anion gap: 8 (ref 5–15)
BUN: 38 mg/dL — ABNORMAL HIGH (ref 8–23)
CO2: 27 mmol/L (ref 22–32)
Calcium: 7.6 mg/dL — ABNORMAL LOW (ref 8.9–10.3)
Chloride: 101 mmol/L (ref 98–111)
Creatinine, Ser: 2.29 mg/dL — ABNORMAL HIGH (ref 0.44–1.00)
GFR calc Af Amer: 22 mL/min — ABNORMAL LOW (ref >60)
GFR calc non Af Amer: 19 mL/min — ABNORMAL LOW (ref >60)
Glucose, Bld: 100 mg/dL — ABNORMAL HIGH (ref 70–99)
Phosphorus: 3.5 mg/dL (ref 2.5–4.6)
Potassium: 3.3 mmol/L — ABNORMAL LOW (ref 3.5–5.1)
Sodium: 136 mmol/L (ref 135–145)

## 2019-04-12 LAB — GLUCOSE, PLEURAL OR PERITONEAL FLUID: Glucose, Fluid: 108 mg/dL

## 2019-04-12 LAB — BODY FLUID CELL COUNT WITH DIFFERENTIAL
Eos, Fluid: 53 %
Lymphs, Fluid: 13 %
Monocyte-Macrophage-Serous Fluid: 26 %
Neutrophil Count, Fluid: 7 %
Total Nucleated Cell Count, Fluid: 916 cu mm

## 2019-04-12 LAB — AMYLASE, PLEURAL OR PERITONEAL FLUID: Amylase, Fluid: <5 U/L

## 2019-04-12 LAB — CBC
HCT: 26.7 % — ABNORMAL LOW (ref 36.0–46.0)
Hemoglobin: 8.7 g/dL — ABNORMAL LOW (ref 12.0–15.0)
MCH: 33.3 pg (ref 26.0–34.0)
MCHC: 32.6 g/dL (ref 30.0–36.0)
MCV: 102.3 fL — ABNORMAL HIGH (ref 80.0–100.0)
Platelets: 98 10*3/uL — ABNORMAL LOW (ref 150–400)
RBC: 2.61 MIL/uL — ABNORMAL LOW (ref 3.87–5.11)
RDW: 19.9 % — ABNORMAL HIGH (ref 11.5–15.5)
WBC: 8.1 10*3/uL (ref 4.0–10.5)
nRBC: 0 % (ref 0.0–0.2)

## 2019-04-12 LAB — CULTURE, BLOOD (ROUTINE X 2)
Culture: NO GROWTH
Culture: NO GROWTH
Special Requests: ADEQUATE
Special Requests: ADEQUATE

## 2019-04-12 LAB — LACTATE DEHYDROGENASE, PLEURAL OR PERITONEAL FLUID: LD, Fluid: 121 U/L — ABNORMAL HIGH (ref 3–23)

## 2019-04-12 LAB — BRAIN NATRIURETIC PEPTIDE: B Natriuretic Peptide: 641 pg/mL — ABNORMAL HIGH (ref 0.0–100.0)

## 2019-04-12 LAB — PROCALCITONIN: Procalcitonin: 0.44 ng/mL

## 2019-04-12 LAB — ALBUMIN, PLEURAL OR PERITONEAL FLUID: Albumin, Fluid: 1.1 g/dL

## 2019-04-12 MED ORDER — POTASSIUM CHLORIDE CRYS ER 20 MEQ PO TBCR
40.0000 meq | EXTENDED_RELEASE_TABLET | Freq: Once | ORAL | Status: AC
  Administered 2019-04-12: 40 meq via ORAL
  Filled 2019-04-12: qty 2

## 2019-04-12 MED ORDER — IPRATROPIUM-ALBUTEROL 0.5-2.5 (3) MG/3ML IN SOLN
3.0000 mL | RESPIRATORY_TRACT | Status: DC
  Administered 2019-04-12 – 2019-04-13 (×6): 3 mL via RESPIRATORY_TRACT
  Filled 2019-04-12 (×6): qty 3

## 2019-04-12 MED ORDER — METHYLPREDNISOLONE SODIUM SUCC 125 MG IJ SOLR
60.0000 mg | Freq: Every day | INTRAMUSCULAR | Status: DC
  Administered 2019-04-12 – 2019-04-14 (×3): 60 mg via INTRAVENOUS
  Filled 2019-04-12 (×3): qty 2

## 2019-04-12 MED ORDER — POTASSIUM CHLORIDE CRYS ER 20 MEQ PO TBCR
20.0000 meq | EXTENDED_RELEASE_TABLET | Freq: Every day | ORAL | Status: DC
  Administered 2019-04-12 – 2019-04-14 (×3): 20 meq via ORAL
  Filled 2019-04-12 (×3): qty 1

## 2019-04-12 MED ORDER — LINEZOLID 600 MG/300ML IV SOLN
600.0000 mg | Freq: Two times a day (BID) | INTRAVENOUS | Status: DC
  Administered 2019-04-12 – 2019-04-13 (×3): 600 mg via INTRAVENOUS
  Filled 2019-04-12 (×5): qty 300

## 2019-04-12 NOTE — Progress Notes (Cosign Needed)
Pt continues to exhibit signs of Hypoxia associated with Chronic Respiratory Failure secondary to Severe COPD. Patient requires the use of NIV both QHS and daytime to help with exacerbation periods. The use of the NIV will treat patient's Low PFT (FEV1/FVC) Levels and can reduce risk of exacerbations and future hospitalizations when used at night and during the day. Pt will need these advanced settings in conjunction with her current medication regimen; BIPAP is not an option due to its functional limitations and the severity of the patient's condition. Failure to have NIV available for use over a 24- hour period could lead to death. Patient is able to clear and maintain their own airway.

## 2019-04-12 NOTE — Progress Notes (Signed)
PROGRESS NOTE    Brooke Baker  WVP:[id number] DOB: 1934-09-18 DOA: 04/06/2019 PCP: Einar Pheasant, MD   Brief Narrative:  85 year old female with COPD/interstitial lung disease with chronic hypoxic respiratory failure on 2 L home O2, CAD s/p PCI, paroxysmal A. fib on Pradaxa, chronic diastolic CHF, history of CVA, type 2 diabetes mellitus, hypertension, hyperlipidemia, thrombocytopenia, seizure disorder and depression who was recently hospitalized and discharged 1 week back secondary to MRSA bacteremia with pneumonia.  She was started on IV vancomycin and plan to treat for 4-week course through a left arm PICC line. She had follow-up labs done as outpatient and found to have acute kidney injury and was told to hold the antibiotic.  At baseline patient has chronic lower extremity swelling/puffiness. Also following hospital discharge patient had an episode of nausea with vomiting and poor p.o. intake.  She was taking ibuprofen as needed for pain for almost 3 times a day recently. Daughter noticed that patient has been increasingly fatigued and sleepy last few days with some confusion. Admitted for further management of her ATN, which was thought to be due to vancomycin.  Subjective: Patient continued to have shortness of breath, seems in distress.  Assessment & Plan:   Principal Problem:   AKI (acute kidney injury) ([place]) Active Problems:   History of CVA (cerebrovascular accident)   Diabetes mellitus ([place])   Hypertension associated with diabetes ([place])   Seizure disorder ([facility])   AF (paroxysmal atrial fibrillation) ([place])   Hyperlipidemia associated with type 2 diabetes mellitus (HCC)   Chronic diastolic CHF (congestive heart failure) (HCC)   ILD (interstitial lung disease) (HCC)   Thrombocytopenia (HCC)   Macrocytic anemia   Chronic respiratory failure (HCC)   MRSA bacteremia   SOB (shortness of breath)  Acute on chronic respiratory failure /chronic diastolic CHF/COPD exacerbation.   No wheezing. Recent EF of 55-60% on TEE.  Patient with tachypnea and dyspnea , bilateral crackles seems improving, has chronic lower extremity edema with lymphedema and chest x-ray obtained today with stable bilateral infiltrate when compared to yesterday.  Troponin remain negative. -Her Lasix dose was increased to 40 mg twice daily by nephrology yesterday and I Gave her an extra dose of Lasix.   Increased dose to 60 mg twice daily. She was diuresing well today with stable renal functions. -Order ReDS Readings of 30. -Continue IV Lasix 60 mg twice daily. -Pulmonary consult for further recommendations. -Start her on Solu-Medrol for any possible COPD exacerbation. -Continue DuoNeb every 4 hourly. -Obtain lung function.  Acute tubular necrosis Suspect vancomycin nephrotoxicity + prerenal ATN?,  Received contrast on 03/26/19.Also taking NSAIDs at home.  UA unremarkable.  FENa of 0.3%. Renal function slowly improving. Continue strict I's/O.  Pt is incontinent and difficult to monitor output closely while on purewick. -Vancomycin was discontinued.  -Nephrology is following-appreciate their recommendations. -Monitor renal functions.  MRSA bacteremia Recent hospitalization with plan on 4 weeks IV vancomycin.  TEE was negative for vegetation.  Vancomycin trough decreased to 12 today. -Continue daptomycin to finish course till 04/15/2019.  Hypokalemia.  Most likely secondary to diuretic. -Repleat potassium and monitor.  CAD with history of PCI On Toprol.  Reduce dose of statin given AKI.  No chest pain symptoms.  Macrocytic anemia/thrombocytopenia.  Remained stable. S/P 1 unit of PRBC on 04/09/19.  Hemoglobin improved to 9.1 from 7.9, today it was 8.7.  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  Hematology planning on bone marrow biopsy as  outpatient. -Continue to monitor and transfuse as needed for hemoglobin below 7 or if patient is  symptomatic.  Paroxysmal A. Fib.  Heart rate elevated in low 100s.  Toprol was held for 2 days due to hypotension which was resumed. She was on Heparin infusion and Pradaxa was held due to ATN, heparin was discontinued and she was started on Eliquis. Per pharmacy there is a possible interaction between Eliquis and phenytoin which can decrease the concentration of Eliquis.  We will discuss with patient and husband once more stable about starting her on Coumadin.  I know patient wants to avoid more monitoring. -Continue Eliquis -Continue Toprol.  Hypertension.  Home dose of Lasix and metoprolol was held due to softer blood pressure yesterday.  Patient did receive some intermittent IV fluid boluses due to tachycardia.  Blood pressure elevated today. Appears volume up. -Resume home dose of metoprolol. -Continue diuresis with IV Lasix-increased dose to 60 mg twice daily due to worsening pulmonary edema.  Seizure disorder Continue phenytoin.  Keppra dose reduced given AKI.  Diabetes mellitus type 2, diet controlled Stable.  Objective: Vitals:   04/11/19 2332 04/12/19 0029 04/12/19 0818 04/12/19 1154  BP: (!) 145/72  (!) 159/78 129/67  Pulse: (!) 111  93 100  Resp:  20 20 20   Temp:   97.6 F (36.4 C) 97.8 F (36.6 C)  TempSrc:   Oral Oral  SpO2: 100%  100% 93%  Weight:      Height:        Intake/Output Summary (Last 24 hours) at 04/12/2019 1352 Last data filed at 04/12/2019 0631 Gross per 24 hour  Intake --  Output 2000 ml  Net -2000 ml   Filed Weights   04/09/19 0445 04/10/19 0500 04/11/19 0500  Weight: 100.4 kg 101.1 kg 100.8 kg    Examination:  General exam: Patient was dyspneic and tachypneic, sitting upright, appears drowsy.  Following simple commands. Respiratory system: Few basal crackles with increased respiratory effort. Cardiovascular system: Tachycardia, no JVD, murmurs, rubs, gallops or clicks. Gastrointestinal system: Soft, nontender, nondistended, bowel sounds  positive. Central nervous system: Alert and oriented. No focal neurological deficits. Extremities: 2+LE edema with signs of chronic venous stasis dermatitis and some element of lymphedema, no cyanosis, pulses intact and symmetrical. Psychiatry: Judgement and insight appear impaired.   DVT prophylaxis: Eliquis Code Status: Full Family Communication: Husband was updated at bedside. Disposition Plan: Pending improvement.  Patient still with ATN, and now acute exacerbation of her chronic diastolic heart failure/COPD exacerbation although there is no wheeze.  Continue to remain quite tachypneic with increased work of breathing.  Consultants:   Nephrology  ID  Pulmonary  Procedures:  Renal ultrasound  Antimicrobials:  Daptomycin  Data Reviewed: I have personally reviewed following labs and imaging studies  CBC: Recent Labs  Lab 04/08/19 0529 04/08/19 0529 04/09/19 0500 04/10/19 0039 04/10/19 0452 04/11/19 1049 04/12/19 0815  WBC 5.8  --  6.2  --  7.9 8.5 8.1  HGB 7.6*   < > 7.4* 7.9* 9.1* 8.7* 8.7*  HCT 23.4*   < > 22.5* 23.5* 28.2* 27.4* 26.7*  MCV 108.8*  --  108.2*  --  103.7* 105.4* 102.3*  PLT 101*  --  104*  --  119* 108* 98*   < > = values in this interval not displayed.   Basic Metabolic Panel: Recent Labs  Lab 04/07/19 0445 04/07/19 0445 04/08/19 0529 04/09/19 0515 04/10/19 0452 04/11/19 1049 04/12/19 0815  NA 135   < > 135  134* 136 138 136  K 5.2*   < > 4.9 4.2 4.3 4.0 3.3*  CL 102   < > 103 103 105 104 101  CO2 27   < > 25 26 27 25 27   GLUCOSE 104*   < > 103* 105* 102* 112* 100*  BUN 47*   < > 46* 44* 42* 39* 38*  CREATININE 2.79*   < > 2.69* 2.56* 2.47* 2.31* 2.29*  CALCIUM 7.4*   < > 7.5* 7.4* 8.0* 8.2* 7.6*  MG 1.8  --   --   --   --   --   --   PHOS 4.1  --   --   --   --  3.2 3.5   < > = values in this interval not displayed.   GFR: Estimated Creatinine Clearance: 22.3 mL/min (A) (by C-G formula based on SCr of 2.29 mg/dL (H)). Liver  Function Tests: Recent Labs  Lab 04/06/19 1533 04/11/19 1049 04/12/19 0815  AST 43*  --   --   ALT 14  --   --   ALKPHOS 103  --   --   BILITOT 1.4*  --   --   PROT 7.0  --   --   ALBUMIN 1.9* 2.9* 2.5*   No results for input(s): LIPASE, AMYLASE in the last 168 hours. No results for input(s): AMMONIA in the last 168 hours. Coagulation Profile: Recent Labs  Lab 04/06/19 2014  INR 2.2*   Cardiac Enzymes: Recent Labs  Lab 04/10/19 0452  CKTOTAL 30*   BNP (last 3 results) No results for input(s): PROBNP in the last 8760 hours. HbA1C: No results for input(s): HGBA1C in the last 72 hours. CBG: Recent Labs  Lab 04/09/19 1152  GLUCAP 103*   Lipid Profile: No results for input(s): CHOL, HDL, LDLCALC, TRIG, CHOLHDL, LDLDIRECT in the last 72 hours. Thyroid Function Tests: No results for input(s): TSH, T4TOTAL, FREET4, T3FREE, THYROIDAB in the last 72 hours. Anemia Panel: No results for input(s): VITAMINB12, FOLATE, FERRITIN, TIBC, IRON, RETICCTPCT in the last 72 hours. Sepsis Labs: No results for input(s): PROCALCITON, LATICACIDVEN in the last 168 hours.  Recent Results (from the past 240 hour(s))  SARS CORONAVIRUS 2 (TAT 6-24 HRS) Nasopharyngeal Nasopharyngeal Swab     Status: None   Collection Time: 04/06/19 11:32 PM   Specimen: Nasopharyngeal Swab  Result Value Ref Range Status   SARS Coronavirus 2 NEGATIVE NEGATIVE Final    Comment: (NOTE) SARS-CoV-2 target nucleic acids are NOT DETECTED. The SARS-CoV-2 RNA is generally detectable in upper and lower respiratory specimens during the acute phase of infection. Negative results do not preclude SARS-CoV-2 infection, do not rule out co-infections with other pathogens, and should not be used as the sole basis for treatment or other patient management decisions. Negative results must be combined with clinical observations, patient history, and epidemiological information. The expected result is Negative. Fact Sheet for  Patients: SugarRoll.be Fact Sheet for Healthcare Providers: https://www.woods-mathews.com/ This test is not yet approved or cleared by the [place] FDA and  has been authorized for detection and/or diagnosis of SARS-CoV-2 by FDA under an Emergency Use Authorization (EUA). This EUA will remain  in effect (meaning this test can be used) for the duration of the COVID-19 declaration under Section 56 4(b)(1) of the Act, 21 U.S.C. section 360bbb-3(b)(1), unless the authorization is terminated or revoked sooner. Performed at [facility], [place] [address], [place], [place] [address]   CULTURE, BLOOD (ROUTINE  X 2) w Reflex to ID Panel     Status: None   Collection Time: 04/07/19  3:54 PM   Specimen: BLOOD  Result Value Ref Range Status   Specimen Description BLOOD BLOOD RIGHT HAND  Final   Special Requests AEROBIC BOTTLE ONLY Blood Culture adequate volume  Final   Culture   Final    NO GROWTH 5 DAYS Performed at [facility], [address] [place], [place] [address]    Report Status 04/12/2019 FINAL  Final  CULTURE, BLOOD (ROUTINE X 2) w Reflex to ID Panel     Status: None   Collection Time: 04/07/19  5:21 PM   Specimen: BLOOD  Result Value Ref Range Status   Specimen Description BLOOD BLOOD RIGHT HAND  Final   Special Requests   Final    BOTTLES DRAWN AEROBIC AND ANAEROBIC Blood Culture adequate volume   Culture   Final    NO GROWTH 5 DAYS Performed at [facility], [address], [place], [place] [address]    Report Status 04/12/2019 FINAL  Final     Radiology Studies: [facility] Chest Port 1 View  Result Date: 04/12/2019 CLINICAL DATA:  Shortness of breath EXAM: PORTABLE CHEST 1 VIEW COMPARISON:  Yesterday FINDINGS: Stable heart size and mediastinal contours. Diffuse interstitial opacity with patchy airspace density and small pleural effusions. The opacity could be a combination of pneumonia and edema. Left PICC with  tip at the SVC. IMPRESSION: Stable widespread airspace disease and pleural effusions Electronically Signed   By: Monte Fantasia M.D.   On: 04/12/2019 08:23   DG Chest Port 1 View  Result Date: 04/11/2019 CLINICAL DATA:  Shortness of breath. EXAM: PORTABLE CHEST 1 VIEW COMPARISON:  Single-view of the chest 04/10/2019, 03/25/2019. PA and lateral chest 04/20/2017 and 04/06/2019. FINDINGS: Left PICC is again seen. Cardiomegaly and bilateral airspace disease persist. Atherosclerosis noted. Aeration has worsened. Small pleural effusions have increased. Right effusion is larger. There is cardiomegaly. Atherosclerosis. IMPRESSION: Worsened bilateral airspace disease and right greater than left pleural effusions compatible with multifocal pneumonia or pulmonary edema. Atherosclerosis. Electronically Signed   By: Inge Rise M.D.   On: 04/11/2019 12:24    Scheduled Meds: . apixaban  2.5 mg Oral BID  . Chlorhexidine Gluconate Cloth  6 each Topical Q0600  . feeding supplement (ENSURE ENLIVE)  237 mL Oral BID BM  . furosemide  60 mg Intravenous BID  . levETIRAcetam  500 mg Oral BID  . metoprolol succinate  25 mg Oral Daily  . mometasone-formoterol  2 puff Inhalation BID  . phenytoin  300 mg Oral QHS   And  . phenytoin  50 mg Oral QHS  . sertraline  75 mg Oral QHS  . sodium chloride flush  3 mL Intravenous Q12H   Continuous Infusions: . DAPTOmycin (CUBICIN)  IV Stopped (04/10/19 2221)     LOS: 6 days   Time spent: 45 minutes.    Lorella Nimrod, MD [facility]  If 7PM-7AM, please contact night-coverage [email or web address]  04/12/2019, 1:52 PM   This record has been created using Systems analyst. Errors have been sought and corrected,but may not always be located. Such creation errors do not reflect on the standard of care.

## 2019-04-12 NOTE — Progress Notes (Signed)
[facility]  ROUNDING NOTE   Subjective:   Right sided thoracentesis today.   UOP 2300 - furosemide increased to 60mg  IV q12.    Objective:  Vital signs in last 24 hours:  Temp:  [97.6 F (36.4 C)-98.1 F (36.7 C)] 97.8 F (36.6 C) (04/07 1154) Pulse Rate:  [93-111] 102 (04/07 1652) Resp:  [20-26] 20 (04/07 1652) BP: (123-159)/(52-87) 123/52 (04/07 1652) SpO2:  [93 %-100 %] 95 % (04/07 1652)  Weight change:  Filed Weights   04/09/19 0445 04/10/19 0500 04/11/19 0500  Weight: 100.4 kg 101.1 kg 100.8 kg    Intake/Output: I/O last 3 completed shifts: In: 212.7 [IV Piggyback:212.7] Out: 2450 [Urine:2450]   Intake/Output this shift:  Total I/O In: -  Out: 700 [Urine:700]  Physical Exam: General: NAD,   Head: Normocephalic, atraumatic. Moist oral mucosal membranes  Eyes: Anicteric, PERRL  Neck: Supple, trachea midline  Lungs:  Crackles at bases bilaterally  Heart: Regular rate and rhythm  Abdomen:  Soft, nontender,   Extremities:  + peripheral edema.  Neurologic: Nonfocal, moving all four extremities  Skin: No lesions        Basic Metabolic Panel: Recent Labs  Lab 04/07/19 0445 04/07/19 0445 04/08/19 0529 04/08/19 0529 04/09/19 0515 04/09/19 0515 04/10/19 0452 04/11/19 1049 04/12/19 0815  NA 135   < > 135  --  134*  --  136 138 136  K 5.2*   < > 4.9  --  4.2  --  4.3 4.0 3.3*  CL 102   < > 103  --  103  --  105 104 101  CO2 27   < > 25  --  26  --  27 25 27   GLUCOSE 104*   < > 103*  --  105*  --  102* 112* 100*  BUN 47*   < > 46*  --  44*  --  42* 39* 38*  CREATININE 2.79*   < > 2.69*  --  2.56*  --  2.47* 2.31* 2.29*  CALCIUM 7.4*   < > 7.5*   < > 7.4*   < > 8.0* 8.2* 7.6*  MG 1.8  --   --   --   --   --   --   --   --   PHOS 4.1  --   --   --   --   --   --  3.2 3.5   < > = values in this interval not displayed.    Liver Function Tests: Recent Labs  Lab 04/06/19 1533 04/11/19 1049 04/12/19 0815  AST 43*  --   --   ALT 14  --    --   ALKPHOS 103  --   --   BILITOT 1.4*  --   --   PROT 7.0  --   --   ALBUMIN 1.9* 2.9* 2.5*   No results for input(s): LIPASE, AMYLASE in the last 168 hours. No results for input(s): AMMONIA in the last 168 hours.  CBC: Recent Labs  Lab 04/08/19 0529 04/08/19 0529 04/09/19 0500 04/10/19 0039 04/10/19 0452 04/11/19 1049 04/12/19 0815  WBC 5.8  --  6.2  --  7.9 8.5 8.1  HGB 7.6*   < > 7.4* 7.9* 9.1* 8.7* 8.7*  HCT 23.4*   < > 22.5* 23.5* 28.2* 27.4* 26.7*  MCV 108.8*  --  108.2*  --  103.7* 105.4* 102.3*  PLT 101*  --  104*  --  119* 108* 98*   < > = values in this interval not displayed.    Cardiac Enzymes: Recent Labs  Lab 04/10/19 0452  CKTOTAL 30*    BNP: Invalid input(s): POCBNP  CBG: Recent Labs  Lab 04/09/19 1152  GLUCAP 103*    Microbiology: Results for orders placed or performed during the hospital encounter of 04/06/19  SARS CORONAVIRUS 2 (TAT 6-24 HRS) Nasopharyngeal Nasopharyngeal Swab     Status: None   Collection Time: 04/06/19 11:32 PM   Specimen: Nasopharyngeal Swab  Result Value Ref Range Status   SARS Coronavirus 2 NEGATIVE NEGATIVE Final    Comment: (NOTE) SARS-CoV-2 target nucleic acids are NOT DETECTED. The SARS-CoV-2 RNA is generally detectable in upper and lower respiratory specimens during the acute phase of infection. Negative results do not preclude SARS-CoV-2 infection, do not rule out co-infections with other pathogens, and should not be used as the sole basis for treatment or other patient management decisions. Negative results must be combined with clinical observations, patient history, and epidemiological information. The expected result is Negative. Fact Sheet for Patients: SugarRoll.be Fact Sheet for Healthcare Providers: https://www.woods-mathews.com/ This test is not yet approved or cleared by the [place] FDA and  has been authorized for detection and/or diagnosis of  SARS-CoV-2 by FDA under an Emergency Use Authorization (EUA). This EUA will remain  in effect (meaning this test can be used) for the duration of the COVID-19 declaration under Section 56 4(b)(1) of the Act, 21 U.S.C. section 360bbb-3(b)(1), unless the authorization is terminated or revoked sooner. Performed at [facility], [place], [place], [place] [address]   CULTURE, BLOOD (ROUTINE X 2) w Reflex to ID Panel     Status: None   Collection Time: 04/07/19  3:54 PM   Specimen: BLOOD  Result Value Ref Range Status   Specimen Description BLOOD BLOOD RIGHT HAND  Final   Special Requests AEROBIC BOTTLE ONLY Blood Culture adequate volume  Final   Culture   Final    NO GROWTH 5 DAYS Performed at [facility], [address] [place], [place] [address]    Report Status 04/12/2019 FINAL  Final  CULTURE, BLOOD (ROUTINE X 2) w Reflex to ID Panel     Status: None   Collection Time: 04/07/19  5:21 PM   Specimen: BLOOD  Result Value Ref Range Status   Specimen Description BLOOD BLOOD RIGHT HAND  Final   Special Requests   Final    BOTTLES DRAWN AEROBIC AND ANAEROBIC Blood Culture adequate volume   Culture   Final    NO GROWTH 5 DAYS Performed at [facility], [address] [place], [place] [address]    Report Status 04/12/2019 FINAL  Final    Coagulation Studies: No results for input(s): LABPROT, INR in the last 72 hours.  Urinalysis: No results for input(s): COLORURINE, LABSPEC, PHURINE, GLUCOSEU, HGBUR, BILIRUBINUR, KETONESUR, PROTEINUR, UROBILINOGEN, NITRITE, LEUKOCYTESUR in the last 72 hours.  Invalid input(s): APPERANCEUR    Imaging: DG Chest Port 1 View  Result Date: 04/12/2019 CLINICAL DATA:  Post right-sided thoracentesis EXAM: PORTABLE CHEST 1 VIEW COMPARISON:  Earlier same day; 04/11/2019; 04/10/2019; chest CT-03/26/2019 FINDINGS: Grossly unchanged cardiac silhouette and mediastinal contours with atherosclerotic plaque within the thoracic  aorta. Stable position of support apparatus. No change to slight reduction in persistent small right-sided effusion post thoracentesis. No pneumothorax. Unchanged small left-sided pleural effusion associated left basilar opacities. Rather extensive bilateral slightly nodular interstitial airspace opacities are grossly unchanged.  No new focal airspace opacities. No acute osseous abnormalities. IMPRESSION: 1. No change to slight reduction of persistent small right-sided effusion post thoracentesis. No pneumothorax. 2. Grossly unchanged rather extensive bilateral slightly nodular interstitial airspace opacities, potentially alveolar pulmonary edema though multifocal infection could have a similar appearance. Clinical correlation is advised. 3. Grossly unchanged small left-sided effusion associated left basilar opacities Electronically Signed   By: Sandi Mariscal M.D.   On: 04/12/2019 15:58   DG Chest Port 1 View  Result Date: 04/12/2019 CLINICAL DATA:  Shortness of breath EXAM: PORTABLE CHEST 1 VIEW COMPARISON:  Yesterday FINDINGS: Stable heart size and mediastinal contours. Diffuse interstitial opacity with patchy airspace density and small pleural effusions. The opacity could be a combination of pneumonia and edema. Left PICC with tip at the SVC. IMPRESSION: Stable widespread airspace disease and pleural effusions Electronically Signed   By: Monte Fantasia M.D.   On: 04/12/2019 08:23   DG Chest Port 1 View  Result Date: 04/11/2019 CLINICAL DATA:  Shortness of breath. EXAM: PORTABLE CHEST 1 VIEW COMPARISON:  Single-view of the chest 04/10/2019, 03/25/2019. PA and lateral chest 04/20/2017 and 04/06/2019. FINDINGS: Left PICC is again seen. Cardiomegaly and bilateral airspace disease persist. Atherosclerosis noted. Aeration has worsened. Small pleural effusions have increased. Right effusion is larger. There is cardiomegaly. Atherosclerosis. IMPRESSION: Worsened bilateral airspace disease and right greater than left  pleural effusions compatible with multifocal pneumonia or pulmonary edema. Atherosclerosis. Electronically Signed   By: Inge Rise M.D.   On: 04/11/2019 12:24   US THORACENTESIS ASP PLEURAL SPACE W/IMG GUIDE  Result Date: 04/12/2019 INDICATION: Symptomatic bilateral pleural effusions. Please perform ultrasound-guided thoracentesis of dominant pleural effusion for diagnostic and therapeutic purposes. EXAM: US THORACENTESIS ASP PLEURAL SPACE W/IMG GUIDE COMPARISON:  Chest radiograph-earlier same day; 04/11/2019; 04/10/2019; chest CT-03/26/2019 MEDICATIONS: None. COMPLICATIONS: None immediate. TECHNIQUE: Informed written consent was obtained from the patient after a discussion of the risks, benefits and alternatives to treatment. A timeout was performed prior to the initiation of the procedure. Initial ultrasound scanning demonstrates a moderate to large sized bilateral pleural effusions, right slightly greater than left. As such, decision was made to proceed with right-sided thoracentesis. The right posteroinferior lateral chest was prepped and draped in the usual sterile fashion. 1% lidocaine was used for local anesthesia. An ultrasound image was saved for documentation purposes. An 8 Fr Safe-T-Centesis catheter was introduced. The thoracentesis was performed. The catheter was removed and a dressing was applied. The patient tolerated the procedure well without immediate post procedural complication. The patient was escorted to have an upright chest radiograph. FINDINGS: A total of approximately 900 cc of blood tinged serous pleural fluid was removed. Requested samples were sent to the laboratory. IMPRESSION: Successful ultrasound-guided right sided thoracentesis yielding 900 cc of blood tinged serous pleural fluid. Postprocedural x-ray was negative for pneumothorax. Electronically Signed   By: Sandi Mariscal M.D.   On: 04/12/2019 16:05     Medications:   . DAPTOmycin (CUBICIN)  IV Stopped (04/10/19 2221)    . apixaban  2.5 mg Oral BID  . Chlorhexidine Gluconate Cloth  6 each Topical Q0600  . feeding supplement (ENSURE ENLIVE)  237 mL Oral BID BM  . furosemide  60 mg Intravenous BID  . ipratropium-albuterol  3 mL Nebulization Q4H  . levETIRAcetam  500 mg Oral BID  . methylPREDNISolone (SOLU-MEDROL) injection  60 mg Intravenous Daily  . metoprolol succinate  25 mg Oral Daily  . mometasone-formoterol  2 puff Inhalation BID  . phenytoin  300 mg Oral QHS   And  . phenytoin  50 mg Oral QHS  . sertraline  75 mg Oral QHS  . sodium chloride flush  3 mL Intravenous Q12H   acetaminophen **OR** acetaminophen, albuterol, morphine injection, ondansetron **OR** ondansetron (ZOFRAN) IV  Assessment/ Plan:  Brooke Baker is a 85 y.o. white female with COPD, interstitial lung disease, coronary artery disease, atrial fibrillation, diastolic congestive heart failure, CVA, diabetes mellitus type 2, hypertension, hyperlipidemia, seizure disorder, thrombocytopenia, depression, who was admitted to [facility] on 04/06/2019 for Acute renal failure (ARF) ([place]) [N17.9] AKI (acute kidney injury) ([place]) [N17.9]  1.  Acute kidney injury with hyperkalemia on admission.  Baseline creatinine 0.8 with normal range GFR.  Was on potassium chloride on admission. Will restart today as her potassium is hypokalemic now.  Acute kidney injury secondary to ATN from vancomycin, ibuprofen and IV contrast exposure on 3/21.  Creatinine continues to improve  2. Bacteremia: MRSA. Vanc level this morning of 21. Holding vancomycin. May resume when vanco level less than 15 Currently on daptomycin - Appreciate ID input.  3. Hypoalbuminemia: secondary to poor nutritional status. Cause of her peripheral edema.  - IV furosemide.   4. Anemia with renal failure: hemoglobin 8.7, macrocytic. Status post PRBC transfusion on 4/4 Iron studies, vitamin B12 and folate are at goal. SPEP/UPEP negative from 01/30/19.   5. Hypertension - metoprolol  and furosemide.   6. Pleural effusion: right sided thoracentesis.    LOS: 6 Ezabella Teska 4/7/20215:57 PM

## 2019-04-12 NOTE — Progress Notes (Signed)
Bedside spirometry done with patient. Patient was easily fatigued and was easily made short of breath. Patient did give her best efforts at testing.       Best  %Predicted  FEV1:    0.43        21%   FVC:    0.58        21%  FEV1/FVC Ratio:  74.17                  101%

## 2019-04-12 NOTE — Procedures (Signed)
Pre procedural Dx: Symptomatic pleural effusion Post procedural Dx: Same  [place] scanning demonstrates moderate to large sized bilateral pleural effusions, right slightly greater than left.  Successful US guided right sided thoracentesis yielding 900 cc of blood tinged pleural fluid.   Samples sent to lab for analysis.  EBL: None Complications: None immediate.  Ronny Bacon, MD Pager #: [phone]

## 2019-04-12 NOTE — Progress Notes (Signed)
Case discussed with Dr. Pascal Lux with interventional radiology after reviewing CT chest and chest x-ray and films over the last several weeks,  Patient had a CT chest on March 20 that showed moderate bilateral effusions with chest x-ray that shows bilateral infiltrates  It seems that the x-ray has progressed and there may be some nodular opacifications as well as some effusions on the current chest x-ray.  At this time I recommend getting ultrasound-guided thoracentesis and assess for aspiration of fluid especially in the setting of MRSA bacteremia  Case discussed with Dr. Pascal Lux and will plan for assessment of effusion with ultrasound-guided thoracentesis.  Plan of care relayed to Dr. Reesa Chew. Patient also may need CT chest to assess for septic emboli

## 2019-04-12 NOTE — Progress Notes (Signed)
ID DM, Asthma, ILD, CAD, CVA, Afib,Rt TKA, Rt THA and hardware in lumbar spine and femur, recent MRSA bacteremia   Pt continues to be short of breath  O/e Lethargic SOB Patient Vitals for the past 24 hrs:  BP Temp Temp src Pulse Resp SpO2  04/12/19 1652 (!) 123/52 - - (!) 102 20 95 %  04/12/19 1543 (!) 132/56 - - (!) 102 - 99 %  04/12/19 1517 135/60 - - (!) 102 - 93 %  04/12/19 1154 129/67 97.8 F (36.6 C) Oral 100 20 93 %  04/12/19 0818 (!) 159/78 97.6 F (36.4 C) Oral 93 20 100 %  04/12/19 0029 - - - - 20 -  04/11/19 2332 (!) 145/72 - - (!) 111 - 100 %  04/11/19 2000 (!) 155/87 98.1 F (36.7 C) Oral (!) 104 (!) 26 98 %    Chest b/l crepts Hs irregular abd soft   Labs  CBC Latest Ref Rng & Units 04/12/2019 04/11/2019 04/10/2019  WBC 4.0 - 10.5 K/uL 8.1 8.5 7.9  Hemoglobin 12.0 - 15.0 g/dL 8.7(L) 8.7(L) 9.1(L)  Hematocrit 36.0 - 46.0 % 26.7(L) 27.4(L) 28.2(L)  Platelets 150 - 400 K/uL 98(L) 108(L) 119(L)   CMP Latest Ref Rng & Units 04/12/2019 04/11/2019 04/10/2019  Glucose 70 - 99 mg/dL 100(H) 112(H) 102(H)  BUN 8 - 23 mg/dL 38(H) 39(H) 42(H)  Creatinine 0.44 - 1.00 mg/dL 2.29(H) 2.31(H) 2.47(H)  Sodium 135 - 145 mmol/L 136 138 136  Potassium 3.5 - 5.1 mmol/L 3.3(L) 4.0 4.3  Chloride 98 - 111 mmol/L 101 104 105  CO2 22 - 32 mmol/L 27 25 27   Calcium 8.9 - 10.3 mg/dL 7.6(L) 8.2(L) 8.0(L)  Total Protein 6.5 - 8.1 g/dL - - -  Total Bilirubin 0.3 - 1.2 mg/dL - - -  Alkaline Phos 38 - 126 U/L - - -  AST 15 - 41 U/L - - -  ALT 0 - 44 U/L - - -    04/12/19   03/23/19     Impression/Recommendation  MRSA bacteremia diagnosed 03/19/19- No clear source and hence was sent home on 4 weeks of IV vancomycin on 3/24. Pt now on daptomycin  AKI- readmitted with AKI Thought to be a combination of medications ( NSAID, VANCo, frusemide , ACEI), contrast,   Hypoxic resp failure- underlying Asthma, interstitial lung disease and now likely fluid overload- with b/l pleural effusion - with  CXR showing b/l infiltrates pulmonologist raised concern for MRSA pneumonia/empyema Had rt pleurocentesis- fluid analysis does not favor infection/empyema  ( alb 1.1, LDH 121, cell count 916 ( 13L, 53 eosionophils,7 N) Currently on dapto- will change to linezolid for better pulmonary coverage as Daptomycin has poor lung concentration  Thrombocytopenia and anemia at baseline- will observe closely while on Linezolid

## 2019-04-13 ENCOUNTER — Encounter: Payer: Self-pay | Admitting: Internal Medicine

## 2019-04-13 DIAGNOSIS — E8770 Fluid overload, unspecified: Secondary | ICD-10-CM

## 2019-04-13 DIAGNOSIS — Z515 Encounter for palliative care: Secondary | ICD-10-CM

## 2019-04-13 DIAGNOSIS — J90 Pleural effusion, not elsewhere classified: Secondary | ICD-10-CM

## 2019-04-13 DIAGNOSIS — J9691 Respiratory failure, unspecified with hypoxia: Secondary | ICD-10-CM

## 2019-04-13 DIAGNOSIS — Z7189 Other specified counseling: Secondary | ICD-10-CM

## 2019-04-13 LAB — RENAL FUNCTION PANEL
Albumin: 2.4 g/dL — ABNORMAL LOW (ref 3.5–5.0)
Anion gap: 9 (ref 5–15)
BUN: 41 mg/dL — ABNORMAL HIGH (ref 8–23)
CO2: 26 mmol/L (ref 22–32)
Calcium: 7.6 mg/dL — ABNORMAL LOW (ref 8.9–10.3)
Chloride: 102 mmol/L (ref 98–111)
Creatinine, Ser: 2.16 mg/dL — ABNORMAL HIGH (ref 0.44–1.00)
GFR calc Af Amer: 24 mL/min — ABNORMAL LOW (ref >60)
GFR calc non Af Amer: 21 mL/min — ABNORMAL LOW (ref >60)
Glucose, Bld: 121 mg/dL — ABNORMAL HIGH (ref 70–99)
Phosphorus: 3.3 mg/dL (ref 2.5–4.6)
Potassium: 3.8 mmol/L (ref 3.5–5.1)
Sodium: 137 mmol/L (ref 135–145)

## 2019-04-13 LAB — PROCALCITONIN: Procalcitonin: 0.36 ng/mL

## 2019-04-13 LAB — CBC
HCT: 24.7 % — ABNORMAL LOW (ref 36.0–46.0)
Hemoglobin: 8 g/dL — ABNORMAL LOW (ref 12.0–15.0)
MCH: 33.3 pg (ref 26.0–34.0)
MCHC: 32.4 g/dL (ref 30.0–36.0)
MCV: 102.9 fL — ABNORMAL HIGH (ref 80.0–100.0)
Platelets: 102 10*3/uL — ABNORMAL LOW (ref 150–400)
RBC: 2.4 MIL/uL — ABNORMAL LOW (ref 3.87–5.11)
RDW: 19.7 % — ABNORMAL HIGH (ref 11.5–15.5)
WBC: 6.1 10*3/uL (ref 4.0–10.5)
nRBC: 0 % (ref 0.0–0.2)

## 2019-04-13 MED ORDER — SERTRALINE HCL 50 MG PO TABS
50.0000 mg | ORAL_TABLET | Freq: Every day | ORAL | Status: DC
  Administered 2019-04-13: 50 mg via ORAL
  Filled 2019-04-13: qty 1

## 2019-04-13 MED ORDER — IPRATROPIUM-ALBUTEROL 0.5-2.5 (3) MG/3ML IN SOLN
3.0000 mL | Freq: Three times a day (TID) | RESPIRATORY_TRACT | Status: DC
  Administered 2019-04-13 – 2019-04-14 (×3): 3 mL via RESPIRATORY_TRACT
  Filled 2019-04-13 (×3): qty 3

## 2019-04-13 MED ORDER — ROSUVASTATIN CALCIUM 10 MG PO TABS
20.0000 mg | ORAL_TABLET | Freq: Every day | ORAL | Status: DC
  Administered 2019-04-13: 20 mg via ORAL
  Filled 2019-04-13: qty 2

## 2019-04-13 NOTE — TOC Progression Note (Signed)
Transition of Care [facility]) - Progression Note    Patient Details  Name: Brooke Baker MRN: [id number] Date of Birth: 05-Sep-1934  Transition of Care [facility]) CM/SW [place], RN Phone Number: 04/13/2019, 10:49 AM  Clinical Narrative:    Patient is open with [facility] for IV ABX at home, Patient needs a hoyer lift for home, she has a hospital bed already, the daughter helps care for the patient, she is starting to have some skin break down, I requested a palliative consult    Expected Discharge Plan: [place] Barriers to Discharge: Continued Medical Work up  Expected Discharge Plan and Services Expected Discharge Plan: [place]   Discharge Planning Services: CM Consult   Living arrangements for the past 2 months: Single Family Home                 DME Arranged: N/A         HH Arranged: RN, PT [place] Agency: [facility] (formerly [employer]) Date [place]: 04/11/19 Time [place]: [phone] Representative spoke with at [place]: [place] ([place]) Interventions    Readmission Risk Interventions No flowsheet data found.

## 2019-04-13 NOTE — Progress Notes (Signed)
Nurse reports daughter upset regarding DNR code status and after family discussions throughout the day with daughter, patient and patient spouse they have unanimously decided she should be full code.  Code status has been updated in patient EMR

## 2019-04-13 NOTE — Progress Notes (Signed)
PROGRESS NOTE    Brooke Baker  TIR:[id number] DOB: 1934-07-03 DOA: 04/06/2019 PCP: Einar Pheasant, MD   Brief Narrative:  85 year old female with COPD/interstitial lung disease with chronic hypoxic respiratory failure on 2 L home O2, CAD s/p PCI, paroxysmal A. fib on Pradaxa, chronic diastolic CHF, history of CVA, type 2 diabetes mellitus, hypertension, hyperlipidemia, thrombocytopenia, seizure disorder and depression who was recently hospitalized and discharged 1 week back secondary to MRSA bacteremia with pneumonia.  She was started on IV vancomycin and plan to treat for 4-week course through a left arm PICC line. She had follow-up labs done as outpatient and found to have acute kidney injury and was told to hold the antibiotic.  At baseline patient has chronic lower extremity swelling/puffiness. Also following hospital discharge patient had an episode of nausea with vomiting and poor p.o. intake.  She was taking ibuprofen as needed for pain for almost 3 times a day recently. Daughter noticed that patient has been increasingly fatigued and sleepy last few days with some confusion. Admitted for further management of her ATN, which was thought to be due to vancomycin.  Subjective: Patient was feeling better today.  Able to speak in full sentences and she was lying flat without any difficulty today.  Assessment & Plan:   Principal Problem:   AKI (acute kidney injury) ([place]) Active Problems:   History of CVA (cerebrovascular accident)   Diabetes mellitus ([place])   Hypertension associated with diabetes ([place])   Seizure disorder (HCC)   AF (paroxysmal atrial fibrillation) ([place])   Hyperlipidemia associated with type 2 diabetes mellitus (HCC)   Chronic diastolic CHF (congestive heart failure) (HCC)   ILD (interstitial lung disease) (HCC)   Thrombocytopenia (HCC)   Macrocytic anemia   Chronic respiratory failure (HCC)   MRSA bacteremia   SOB (shortness of breath)  Acute on chronic  respiratory failure /chronic diastolic CHF/COPD exacerbation.  No wheezing. Recent EF of 55-60% on TEE.  Patient with tachypnea and dyspnea , bilateral crackles seems improving, has chronic lower extremity edema with lymphedema and chest x-ray obtained today with stable bilateral infiltrate when compared to yesterday.  Troponin remain negative. Patient has thoracentesis yesterday with removal of 900 cc, labs are more consistent with transudate. -Order ReDS Readings of 30. -Decrease IV Lasix to 40 mg twice daily. -Pulmonary consult for further recommendations. -Decrease Solu-Medrol. -Continue DuoNeb every 4 hourly. - lung function.  Acute tubular necrosis Suspect vancomycin nephrotoxicity + prerenal ATN?,  Received contrast on 03/26/19.Also taking NSAIDs at home.  UA unremarkable.  FENa of 0.3%. Renal function slowly improving. Continue strict I's/O.  Pt is incontinent and difficult to monitor output closely while on purewick. -Vancomycin was discontinued.  -Nephrology is following-appreciate their recommendations. -Monitor renal functions.  MRSA bacteremia Recent hospitalization with plan on 4 weeks IV vancomycin.  TEE was negative for vegetation.  Vancomycin was discontinued and she was initially started on daptomycin. -Daptomycin was switched with linezolid for a better pulmonary coverage.  Hypokalemia.  Most likely secondary to diuretic. -Repleat potassium and monitor.  CAD with history of PCI On Toprol.  Reduce dose of statin given AKI.  No chest pain symptoms.  Macrocytic anemia/thrombocytopenia.  Remained stable. S/P 1 unit of PRBC on 04/09/19.  Hemoglobin improved to 9.1 from 7.9, today it was 8.7.  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  Hematology planning on bone marrow biopsy as outpatient. -Continue to monitor and transfuse as needed for hemoglobin below 7 or  if patient is symptomatic.  Paroxysmal A. Fib.  Heart rate  elevated in low 90s.  Toprol was held for 2 days due to hypotension which was resumed. She was on Heparin infusion and Pradaxa was held due to ATN, heparin was discontinued and she was started on Eliquis. Per pharmacy there is a possible interaction between Eliquis and phenytoin which can decrease the concentration of Eliquis.  We will discuss with patient and husband once more stable about starting her on Coumadin.  I know patient wants to avoid more monitoring. -Continue Eliquis -Continue Toprol.  Hypertension.  Home dose of Lasix and metoprolol was held due to softer blood pressure yesterday.  Patient did receive some intermittent IV fluid boluses due to tachycardia.  Blood pressure elevated today. Appears volume up. -Resume home dose of metoprolol. -Continue diuresis with IV Lasix.  Seizure disorder Continue phenytoin.  Keppra dose reduced given AKI.  Diabetes mellitus type 2, diet controlled Stable.  Objective: Vitals:   04/13/19 0357 04/13/19 0553 04/13/19 0809 04/13/19 0925  BP: 119/75  (!) 116/46 118/60  Pulse: 100  92 95  Resp: 18  18   Temp: 97.7 F (36.5 C)  97.7 F (36.5 C)   TempSrc: Oral  Oral   SpO2: (!) 67%  98%   Weight:  92.9 kg    Height:        Intake/Output Summary (Last 24 hours) at 04/13/2019 1458 Last data filed at 04/13/2019 1418 Gross per 24 hour  Intake 285.81 ml  Output 2000 ml  Net -1714.19 ml   Filed Weights   04/10/19 0500 04/11/19 0500 04/13/19 0553  Weight: 101.1 kg 100.8 kg 92.9 kg    Examination:  General exam: Chronically ill-appearing elderly lady, appears calm and comfortable today. Respiratory system: Few basal crackles with normal respiratory effort. Cardiovascular system: RRR, no JVD, murmurs, rubs, gallops or clicks. Gastrointestinal system: Soft, nontender, nondistended, bowel sounds positive. Central nervous system: Alert and oriented. No focal neurological deficits. Extremities: 2+LE edema with signs of chronic venous stasis  dermatitis and some element of lymphedema, no cyanosis, pulses intact and symmetrical. Psychiatry: Judgement and insight appear normal.   DVT prophylaxis: Eliquis Code Status: Full Family Communication: Husband was updated at bedside. Disposition Plan: Pending improvement.  Patient still with ATN, and now acute exacerbation of her chronic diastolic heart failure/COPD exacerbation although there is no wheeze.  Is likely will be able to go home in the next 24 to 48 hours if remains stable.  Maximum home health services ordered.  Consultants:   Nephrology  ID  Pulmonary  Procedures:  Renal ultrasound  Antimicrobials:  Daptomycin  Data Reviewed: I have personally reviewed following labs and imaging studies  CBC: Recent Labs  Lab 04/09/19 0500 04/09/19 0500 04/10/19 0039 04/10/19 0452 04/11/19 1049 04/12/19 0815 04/13/19 1039  WBC 6.2  --   --  7.9 8.5 8.1 6.1  HGB 7.4*   < > 7.9* 9.1* 8.7* 8.7* 8.0*  HCT 22.5*   < > 23.5* 28.2* 27.4* 26.7* 24.7*  MCV 108.2*  --   --  103.7* 105.4* 102.3* 102.9*  PLT 104*  --   --  119* 108* 98* 102*   < > = values in this interval not displayed.   Basic Metabolic Panel: Recent Labs  Lab 04/07/19 0445 04/08/19 0529 04/09/19 0515 04/10/19 0452 04/11/19 1049 04/12/19 0815 04/13/19 1039  NA 135   < > 134* 136 138 136 137  K 5.2*   < > 4.2 4.3  4.0 3.3* 3.8  CL 102   < > 103 105 104 101 102  CO2 27   < > 26 27 25 27 26   GLUCOSE 104*   < > 105* 102* 112* 100* 121*  BUN 47*   < > 44* 42* 39* 38* 41*  CREATININE 2.79*   < > 2.56* 2.47* 2.31* 2.29* 2.16*  CALCIUM 7.4*   < > 7.4* 8.0* 8.2* 7.6* 7.6*  MG 1.8  --   --   --   --   --   --   PHOS 4.1  --   --   --  3.2 3.5 3.3   < > = values in this interval not displayed.   GFR: Estimated Creatinine Clearance: 22.6 mL/min (A) (by C-G formula based on SCr of 2.16 mg/dL (H)). Liver Function Tests: Recent Labs  Lab 04/06/19 1533 04/11/19 1049 04/12/19 0815 04/13/19 1039  AST 43*   --   --   --   ALT 14  --   --   --   ALKPHOS 103  --   --   --   BILITOT 1.4*  --   --   --   PROT 7.0  --   --   --   ALBUMIN 1.9* 2.9* 2.5* 2.4*   No results for input(s): LIPASE, AMYLASE in the last 168 hours. No results for input(s): AMMONIA in the last 168 hours. Coagulation Profile: Recent Labs  Lab 04/06/19 2014  INR 2.2*   Cardiac Enzymes: Recent Labs  Lab 04/10/19 0452  CKTOTAL 30*   BNP (last 3 results) No results for input(s): PROBNP in the last 8760 hours. HbA1C: No results for input(s): HGBA1C in the last 72 hours. CBG: Recent Labs  Lab 04/09/19 1152  GLUCAP 103*   Lipid Profile: No results for input(s): CHOL, HDL, LDLCALC, TRIG, CHOLHDL, LDLDIRECT in the last 72 hours. Thyroid Function Tests: No results for input(s): TSH, T4TOTAL, FREET4, T3FREE, THYROIDAB in the last 72 hours. Anemia Panel: No results for input(s): VITAMINB12, FOLATE, FERRITIN, TIBC, IRON, RETICCTPCT in the last 72 hours. Sepsis Labs: Recent Labs  Lab 04/12/19 0500 04/13/19 1039  PROCALCITON 0.44 0.36    Recent Results (from the past 240 hour(s))  SARS CORONAVIRUS 2 (TAT 6-24 HRS) Nasopharyngeal Nasopharyngeal Swab     Status: None   Collection Time: 04/06/19 11:32 PM   Specimen: Nasopharyngeal Swab  Result Value Ref Range Status   SARS Coronavirus 2 NEGATIVE NEGATIVE Final    Comment: (NOTE) SARS-CoV-2 target nucleic acids are NOT DETECTED. The SARS-CoV-2 RNA is generally detectable in upper and lower respiratory specimens during the acute phase of infection. Negative results do not preclude SARS-CoV-2 infection, do not rule out co-infections with other pathogens, and should not be used as the sole basis for treatment or other patient management decisions. Negative results must be combined with clinical observations, patient history, and epidemiological information. The expected result is Negative. Fact Sheet for Patients: SugarRoll.be Fact  Sheet for Healthcare Providers: https://www.woods-mathews.com/ This test is not yet approved or cleared by the [place] FDA and  has been authorized for detection and/or diagnosis of SARS-CoV-2 by FDA under an Emergency Use Authorization (EUA). This EUA will remain  in effect (meaning this test can be used) for the duration of the COVID-19 declaration under Section 56 4(b)(1) of the Act, 21 U.S.C. section 360bbb-3(b)(1), unless the authorization is terminated or revoked sooner. Performed at [facility], [place] [address], [place], [place]  [address]   CULTURE, BLOOD (ROUTINE X 2) w Reflex to ID Panel     Status: None   Collection Time: 04/07/19  3:54 PM   Specimen: BLOOD  Result Value Ref Range Status   Specimen Description BLOOD BLOOD RIGHT HAND  Final   Special Requests AEROBIC BOTTLE ONLY Blood Culture adequate volume  Final   Culture   Final    NO GROWTH 5 DAYS Performed at [facility], [address] [place], [place] [address]    Report Status 04/12/2019 FINAL  Final  CULTURE, BLOOD (ROUTINE X 2) w Reflex to ID Panel     Status: None   Collection Time: 04/07/19  5:21 PM   Specimen: BLOOD  Result Value Ref Range Status   Specimen Description BLOOD BLOOD RIGHT HAND  Final   Special Requests   Final    BOTTLES DRAWN AEROBIC AND ANAEROBIC Blood Culture adequate volume   Culture   Final    NO GROWTH 5 DAYS Performed at [facility], [address] [place], [place] [address]    Report Status 04/12/2019 FINAL  Final  Body fluid culture     Status: None (Preliminary result)   Collection Time: 04/12/19  3:53 PM   Specimen: PATH Cytology Pleural fluid  Result Value Ref Range Status   Specimen Description   Final    PLEURAL Performed at [facility], [address], [place], [place] [address]    Special Requests   Final    PLEURAL Performed at [facility], [address] [place], [place] [address]    Gram Stain    Final    ABUNDANT WBC PRESENT,BOTH PMN AND MONONUCLEAR NO ORGANISMS SEEN    Culture   Final    NO GROWTH < 24 HOURS Performed at [facility], [place] [address] [place], [place] [address]    Report Status PENDING  Incomplete     Radiology Studies: DG Chest Port 1 View  Result Date: 04/12/2019 CLINICAL DATA:  Post right-sided thoracentesis EXAM: PORTABLE CHEST 1 VIEW COMPARISON:  Earlier same day; 04/11/2019; 04/10/2019; chest CT-03/26/2019 FINDINGS: Grossly unchanged cardiac silhouette and mediastinal contours with atherosclerotic plaque within the thoracic aorta. Stable position of support apparatus. No change to slight reduction in persistent small right-sided effusion post thoracentesis. No pneumothorax. Unchanged small left-sided pleural effusion associated left basilar opacities. Rather extensive bilateral slightly nodular interstitial airspace opacities are grossly unchanged. No new focal airspace opacities. No acute osseous abnormalities. IMPRESSION: 1. No change to slight reduction of persistent small right-sided effusion post thoracentesis. No pneumothorax. 2. Grossly unchanged rather extensive bilateral slightly nodular interstitial airspace opacities, potentially alveolar pulmonary edema though multifocal infection could have a similar appearance. Clinical correlation is advised. 3. Grossly unchanged small left-sided effusion associated left basilar opacities Electronically Signed   By: Sandi Mariscal M.D.   On: 04/12/2019 15:58   DG Chest Port 1 View  Result Date: 04/12/2019 CLINICAL DATA:  Shortness of breath EXAM: PORTABLE CHEST 1 VIEW COMPARISON:  Yesterday FINDINGS: Stable heart size and mediastinal contours. Diffuse interstitial opacity with patchy airspace density and small pleural effusions. The opacity could be a combination of pneumonia and edema. Left PICC with tip at the SVC. IMPRESSION: Stable widespread airspace disease and pleural effusions Electronically Signed   By:  Monte Fantasia M.D.   On: 04/12/2019 08:23   US THORACENTESIS ASP PLEURAL SPACE W/IMG GUIDE  Result Date: 04/12/2019 INDICATION: Symptomatic bilateral pleural effusions. Please perform ultrasound-guided thoracentesis of  dominant pleural effusion for diagnostic and therapeutic purposes. EXAM: US THORACENTESIS ASP PLEURAL SPACE W/IMG GUIDE COMPARISON:  Chest radiograph-earlier same day; 04/11/2019; 04/10/2019; chest CT-03/26/2019 MEDICATIONS: None. COMPLICATIONS: None immediate. TECHNIQUE: Informed written consent was obtained from the patient after a discussion of the risks, benefits and alternatives to treatment. A timeout was performed prior to the initiation of the procedure. Initial ultrasound scanning demonstrates a moderate to large sized bilateral pleural effusions, right slightly greater than left. As such, decision was made to proceed with right-sided thoracentesis. The right posteroinferior lateral chest was prepped and draped in the usual sterile fashion. 1% lidocaine was used for local anesthesia. An ultrasound image was saved for documentation purposes. An 8 Fr Safe-T-Centesis catheter was introduced. The thoracentesis was performed. The catheter was removed and a dressing was applied. The patient tolerated the procedure well without immediate post procedural complication. The patient was escorted to have an upright chest radiograph. FINDINGS: A total of approximately 900 cc of blood tinged serous pleural fluid was removed. Requested samples were sent to the laboratory. IMPRESSION: Successful ultrasound-guided right sided thoracentesis yielding 900 cc of blood tinged serous pleural fluid. Postprocedural x-ray was negative for pneumothorax. Electronically Signed   By: Sandi Mariscal M.D.   On: 04/12/2019 16:05    Scheduled Meds: . apixaban  2.5 mg Oral BID  . Chlorhexidine Gluconate Cloth  6 each Topical Q0600  . feeding supplement (ENSURE ENLIVE)  237 mL Oral BID BM  . furosemide  60 mg  Intravenous BID  . ipratropium-albuterol  3 mL Nebulization Q4H  . levETIRAcetam  500 mg Oral BID  . methylPREDNISolone (SOLU-MEDROL) injection  60 mg Intravenous Daily  . metoprolol succinate  25 mg Oral Daily  . mometasone-formoterol  2 puff Inhalation BID  . phenytoin  300 mg Oral QHS   And  . phenytoin  50 mg Oral QHS  . potassium chloride  20 mEq Oral Daily  . rosuvastatin  20 mg Oral q1800  . sertraline  50 mg Oral QHS  . sodium chloride flush  3 mL Intravenous Q12H   Continuous Infusions: . linezolid (ZYVOX) IV 600 mg (04/13/19 0942)     LOS: 7 days   Time spent: 45 minutes.    Lorella Nimrod, MD [facility]  If 7PM-7AM, please contact night-coverage [email or web address]  04/13/2019, 2:58 PM   This record has been created using Systems analyst. Errors have been sought and corrected,but may not always be located. Such creation errors do not reflect on the standard of care.

## 2019-04-13 NOTE — Progress Notes (Signed)
Pt and daughter questioning the DNR status. They state she should be Full code. Pt states " she wants everything done that can be done". Oncoming nurse at bedside and confirmed the above. Attempted to call MD for clarification, MD unaware of above (after hours) and recommended calling on call Pallative NP. NP paged and notified, and updating code status.

## 2019-04-13 NOTE — Progress Notes (Signed)
Pts BP 101/53, pt has scheduled 60 mg Lasix. MD notified. Per MD hold lasix.

## 2019-04-13 NOTE — Progress Notes (Signed)
ID Pt doing much better after yesterday's rt pleurocentesis Says breathing better appetite better Ate sandwich  O/e awake and alert BP (!) 101/53 (BP Location: Right Arm)   Pulse 86   Temp 98 F (36.7 C) (Oral)   Resp 16   Ht 5\' 5"  (1.651 m)   Wt 92.9 kg   LMP 08/17/1965   SpO2 99%   BMI 34.08 kg/m  Not dyspneoc Chest b/l air entry crepts bases  Hs irregular Abd soft Edema legs  CBC Latest Ref Rng & Units 04/13/2019 04/12/2019 04/11/2019  WBC 4.0 - 10.5 K/uL 6.1 8.1 8.5  Hemoglobin 12.0 - 15.0 g/dL 8.0(L) 8.7(L) 8.7(L)  Hematocrit 36.0 - 46.0 % 24.7(L) 26.7(L) 27.4(L)  Platelets 150 - 400 K/uL 102(L) 98(L) 108(L)    CMP Latest Ref Rng & Units 04/13/2019 04/12/2019 04/11/2019  Glucose 70 - 99 mg/dL 121(H) 100(H) 112(H)  BUN 8 - 23 mg/dL 41(H) 38(H) 39(H)  Creatinine 0.44 - 1.00 mg/dL 2.16(H) 2.29(H) 2.31(H)  Sodium 135 - 145 mmol/L 137 136 138  Potassium 3.5 - 5.1 mmol/L 3.8 3.3(L) 4.0  Chloride 98 - 111 mmol/L 102 101 104  CO2 22 - 32 mmol/L 26 27 25   Calcium 8.9 - 10.3 mg/dL 7.6(L) 7.6(L) 8.2(L)  Total Protein 6.5 - 8.1 g/dL - - -  Total Bilirubin 0.3 - 1.2 mg/dL - - -  Alkaline Phos 38 - 126 U/L - - -  AST 15 - 41 U/L - - -  ALT 0 - 44 U/L - - -    Impression/recommendation  MRSA bacteremia diagnosed 03/19/19- No clear source and hence was sent home on 4 weeks of IV vancomycin on 3/24 to complete on 04/16/19. Vancomycin changed to daptomycin to linezolid  AKI-I Thought to be a combination of medications ( NSAID, VANCo,  contrast,   Hypoxic resp failure- with underlying  interstitial lung disease and fluid overload-chf- BNP high with b/l pleural effusion - Had rt pleurocentesis- fluid analysis does not favor infection/empyema  ( alb 1.1, LDH 121, cell count 916 ( 13L, 53 eosionophils,7 N) Pt feeling better after pleurocentesis   Thrombocytopenia and anemia -stable Observe closely while on linezolid If planning to discharge tomorrow then can go on PO linezolid  600mg  PO BID  until 04/16/19 Will not need IV and PICC can be removed on discharge Discussed the management with patient and her daughter tammy

## 2019-04-13 NOTE — Progress Notes (Signed)
[facility]  ROUNDING NOTE   Subjective:   Patient is feeling much better.   Creatinine 2.16 (2.29)  Changed to linezolid   Objective:  Vital signs in last 24 hours:  Temp:  [97.7 F (36.5 C)-98.5 F (36.9 C)] 97.7 F (36.5 C) (04/08 0809) Pulse Rate:  [85-103] 95 (04/08 0925) Resp:  [18-20] 18 (04/08 0809) BP: (114-135)/(46-75) 118/60 (04/08 0925) SpO2:  [67 %-100 %] 98 % (04/08 0809) Weight:  [92.9 kg] 92.9 kg (04/08 0553)  Weight change:  Filed Weights   04/10/19 0500 04/11/19 0500 04/13/19 0553  Weight: 101.1 kg 100.8 kg 92.9 kg    Intake/Output: I/O last 3 completed shifts: In: 285.8 [IV Piggyback:285.8] Out: 1700 [Urine:1700]   Intake/Output this shift:  Total I/O In: -  Out: 800 [Urine:800]  Physical Exam: General: NAD,   Head: Normocephalic, atraumatic. Moist oral mucosal membranes  Eyes: Anicteric, PERRL  Neck: Supple, trachea midline  Lungs:  Crackles at bases bilaterally  Heart: Regular rate and rhythm  Abdomen:  Soft, nontender,   Extremities:  + peripheral edema.  Neurologic: Nonfocal, moving all four extremities  Skin: No lesions        Basic Metabolic Panel: Recent Labs  Lab 04/07/19 0445 04/08/19 0529 04/09/19 0515 04/09/19 0515 04/10/19 0452 04/10/19 0452 04/11/19 1049 04/12/19 0815 04/13/19 1039  NA 135   < > 134*  --  136  --  138 136 137  K 5.2*   < > 4.2  --  4.3  --  4.0 3.3* 3.8  CL 102   < > 103  --  105  --  104 101 102  CO2 27   < > 26  --  27  --  25 27 26   GLUCOSE 104*   < > 105*  --  102*  --  112* 100* 121*  BUN 47*   < > 44*  --  42*  --  39* 38* 41*  CREATININE 2.79*   < > 2.56*  --  2.47*  --  2.31* 2.29* 2.16*  CALCIUM 7.4*   < > 7.4*   < > 8.0*   < > 8.2* 7.6* 7.6*  MG 1.8  --   --   --   --   --   --   --   --   PHOS 4.1  --   --   --   --   --  3.2 3.5 3.3   < > = values in this interval not displayed.    Liver Function Tests: Recent Labs  Lab 04/06/19 1533 04/11/19 1049  04/12/19 0815 04/13/19 1039  AST 43*  --   --   --   ALT 14  --   --   --   ALKPHOS 103  --   --   --   BILITOT 1.4*  --   --   --   PROT 7.0  --   --   --   ALBUMIN 1.9* 2.9* 2.5* 2.4*   No results for input(s): LIPASE, AMYLASE in the last 168 hours. No results for input(s): AMMONIA in the last 168 hours.  CBC: Recent Labs  Lab 04/09/19 0500 04/09/19 0500 04/10/19 0039 04/10/19 0452 04/11/19 1049 04/12/19 0815 04/13/19 1039  WBC 6.2  --   --  7.9 8.5 8.1 6.1  HGB 7.4*   < > 7.9* 9.1* 8.7* 8.7* 8.0*  HCT 22.5*   < > 23.5* 28.2* 27.4* 26.7* 24.7*  MCV 108.2*  --   --  103.7* 105.4* 102.3* 102.9*  PLT 104*  --   --  119* 108* 98* 102*   < > = values in this interval not displayed.    Cardiac Enzymes: Recent Labs  Lab 04/10/19 0452  CKTOTAL 30*    BNP: Invalid input(s): POCBNP  CBG: Recent Labs  Lab 04/09/19 1152  GLUCAP 103*    Microbiology: Results for orders placed or performed during the hospital encounter of 04/06/19  SARS CORONAVIRUS 2 (TAT 6-24 HRS) Nasopharyngeal Nasopharyngeal Swab     Status: None   Collection Time: 04/06/19 11:32 PM   Specimen: Nasopharyngeal Swab  Result Value Ref Range Status   SARS Coronavirus 2 NEGATIVE NEGATIVE Final    Comment: (NOTE) SARS-CoV-2 target nucleic acids are NOT DETECTED. The SARS-CoV-2 RNA is generally detectable in upper and lower respiratory specimens during the acute phase of infection. Negative results do not preclude SARS-CoV-2 infection, do not rule out co-infections with other pathogens, and should not be used as the sole basis for treatment or other patient management decisions. Negative results must be combined with clinical observations, patient history, and epidemiological information. The expected result is Negative. Fact Sheet for Patients: SugarRoll.be Fact Sheet for Healthcare Providers: https://www.woods-mathews.com/ This test is not yet approved or  cleared by the [place] FDA and  has been authorized for detection and/or diagnosis of SARS-CoV-2 by FDA under an Emergency Use Authorization (EUA). This EUA will remain  in effect (meaning this test can be used) for the duration of the COVID-19 declaration under Section 56 4(b)(1) of the Act, 21 U.S.C. section 360bbb-3(b)(1), unless the authorization is terminated or revoked sooner. Performed at [facility], [place] [address] [place], [place] [address]   CULTURE, BLOOD (ROUTINE X 2) w Reflex to ID Panel     Status: None   Collection Time: 04/07/19  3:54 PM   Specimen: BLOOD  Result Value Ref Range Status   Specimen Description BLOOD BLOOD RIGHT HAND  Final   Special Requests AEROBIC BOTTLE ONLY Blood Culture adequate volume  Final   Culture   Final    NO GROWTH 5 DAYS Performed at [facility], [address] [place], [place] [address]    Report Status 04/12/2019 FINAL  Final  CULTURE, BLOOD (ROUTINE X 2) w Reflex to ID Panel     Status: None   Collection Time: 04/07/19  5:21 PM   Specimen: BLOOD  Result Value Ref Range Status   Specimen Description BLOOD BLOOD RIGHT HAND  Final   Special Requests   Final    BOTTLES DRAWN AEROBIC AND ANAEROBIC Blood Culture adequate volume   Culture   Final    NO GROWTH 5 DAYS Performed at [facility], [address] [place], [place] [address]    Report Status 04/12/2019 FINAL  Final  Body fluid culture     Status: None (Preliminary result)   Collection Time: 04/12/19  3:53 PM   Specimen: PATH Cytology Pleural fluid  Result Value Ref Range Status   Specimen Description   Final    PLEURAL Performed at [facility], [address] [place], [place] [address]    Special Requests   Final    PLEURAL Performed at [facility], [address] [place], [place] [address]    Gram Stain   Final    ABUNDANT WBC PRESENT,BOTH PMN AND MONONUCLEAR NO ORGANISMS SEEN    Culture   Final  NO  GROWTH < 24 HOURS Performed at [place] [address], [place], [place] [address]    Report Status PENDING  Incomplete    Coagulation Studies: No results for input(s): LABPROT, INR in the last 72 hours.  Urinalysis: No results for input(s): COLORURINE, LABSPEC, PHURINE, GLUCOSEU, HGBUR, BILIRUBINUR, KETONESUR, PROTEINUR, UROBILINOGEN, NITRITE, LEUKOCYTESUR in the last 72 hours.  Invalid input(s): APPERANCEUR    Imaging: DG Chest Port 1 View  Result Date: 04/12/2019 CLINICAL DATA:  Post right-sided thoracentesis EXAM: PORTABLE CHEST 1 VIEW COMPARISON:  Earlier same day; 04/11/2019; 04/10/2019; chest CT-03/26/2019 FINDINGS: Grossly unchanged cardiac silhouette and mediastinal contours with atherosclerotic plaque within the thoracic aorta. Stable position of support apparatus. No change to slight reduction in persistent small right-sided effusion post thoracentesis. No pneumothorax. Unchanged small left-sided pleural effusion associated left basilar opacities. Rather extensive bilateral slightly nodular interstitial airspace opacities are grossly unchanged. No new focal airspace opacities. No acute osseous abnormalities. IMPRESSION: 1. No change to slight reduction of persistent small right-sided effusion post thoracentesis. No pneumothorax. 2. Grossly unchanged rather extensive bilateral slightly nodular interstitial airspace opacities, potentially alveolar pulmonary edema though multifocal infection could have a similar appearance. Clinical correlation is advised. 3. Grossly unchanged small left-sided effusion associated left basilar opacities Electronically Signed   By: Sandi Mariscal M.D.   On: 04/12/2019 15:58   DG Chest Port 1 View  Result Date: 04/12/2019 CLINICAL DATA:  Shortness of breath EXAM: PORTABLE CHEST 1 VIEW COMPARISON:  Yesterday FINDINGS: Stable heart size and mediastinal contours. Diffuse interstitial opacity with patchy airspace density and small pleural effusions. The  opacity could be a combination of pneumonia and edema. Left PICC with tip at the SVC. IMPRESSION: Stable widespread airspace disease and pleural effusions Electronically Signed   By: Monte Fantasia M.D.   On: 04/12/2019 08:23   US THORACENTESIS ASP PLEURAL SPACE W/IMG GUIDE  Result Date: 04/12/2019 INDICATION: Symptomatic bilateral pleural effusions. Please perform ultrasound-guided thoracentesis of dominant pleural effusion for diagnostic and therapeutic purposes. EXAM: US THORACENTESIS ASP PLEURAL SPACE W/IMG GUIDE COMPARISON:  Chest radiograph-earlier same day; 04/11/2019; 04/10/2019; chest CT-03/26/2019 MEDICATIONS: None. COMPLICATIONS: None immediate. TECHNIQUE: Informed written consent was obtained from the patient after a discussion of the risks, benefits and alternatives to treatment. A timeout was performed prior to the initiation of the procedure. Initial ultrasound scanning demonstrates a moderate to large sized bilateral pleural effusions, right slightly greater than left. As such, decision was made to proceed with right-sided thoracentesis. The right posteroinferior lateral chest was prepped and draped in the usual sterile fashion. 1% lidocaine was used for local anesthesia. An ultrasound image was saved for documentation purposes. An 8 Fr Safe-T-Centesis catheter was introduced. The thoracentesis was performed. The catheter was removed and a dressing was applied. The patient tolerated the procedure well without immediate post procedural complication. The patient was escorted to have an upright chest radiograph. FINDINGS: A total of approximately 900 cc of blood tinged serous pleural fluid was removed. Requested samples were sent to the laboratory. IMPRESSION: Successful ultrasound-guided right sided thoracentesis yielding 900 cc of blood tinged serous pleural fluid. Postprocedural x-ray was negative for pneumothorax. Electronically Signed   By: Sandi Mariscal M.D.   On: 04/12/2019 16:05      Medications:   . linezolid (ZYVOX) IV 600 mg (04/13/19 0942)   . apixaban  2.5 mg Oral BID  . Chlorhexidine Gluconate Cloth  6 each Topical Q0600  . feeding supplement (ENSURE ENLIVE)  237 mL Oral BID  BM  . furosemide  60 mg Intravenous BID  . ipratropium-albuterol  3 mL Nebulization Q4H  . levETIRAcetam  500 mg Oral BID  . methylPREDNISolone (SOLU-MEDROL) injection  60 mg Intravenous Daily  . metoprolol succinate  25 mg Oral Daily  . mometasone-formoterol  2 puff Inhalation BID  . phenytoin  300 mg Oral QHS   And  . phenytoin  50 mg Oral QHS  . potassium chloride  20 mEq Oral Daily  . rosuvastatin  20 mg Oral q1800  . sertraline  50 mg Oral QHS  . sodium chloride flush  3 mL Intravenous Q12H   acetaminophen **OR** acetaminophen, albuterol, morphine injection, ondansetron **OR** ondansetron (ZOFRAN) IV  Assessment/ Plan:  Brooke Baker is a 85 y.o. white female with COPD, interstitial lung disease, coronary artery disease, atrial fibrillation, diastolic congestive heart failure, CVA, diabetes mellitus type 2, hypertension, hyperlipidemia, seizure disorder, thrombocytopenia, depression, who was admitted to [facility] on 04/06/2019 for Acute renal failure (ARF) ([place]) [N17.9] AKI (acute kidney injury) ([place]) [N17.9]  1.  Acute kidney injury with hyperkalemia on admission.  Baseline creatinine 0.8 with normal range GFR.  Was on potassium chloride on admission. Will restart today as her potassium is hypokalemic now.  Acute kidney injury secondary to ATN from vancomycin, ibuprofen and IV contrast exposure on 3/21.  Creatinine continues to improve  2. Bacteremia: MRSA. Secondary to vancomycin toxicity.  - started on linezolid  - Appreciate ID input.  3. Hypoalbuminemia: secondary to poor nutritional status. Cause of her peripheral edema.  - Reduce IV furosemide to 40mg  IV q12.   4. Anemia with renal failure: hemoglobin 8, macrocytic. Status post PRBC transfusion on 4/4 Iron  studies, vitamin B12 and folate are at goal. SPEP/UPEP negative from 01/30/19.   5. Hypertension - metoprolol and furosemide.   6. Pleural effusions: right sided thoracentesis on 4/7 with 967mL removed. Transudative.  - Appreciate pulmonary input.    LOS: 7 Makinzy Cleere 4/8/202112:42 PM

## 2019-04-13 NOTE — Consult Note (Addendum)
Consultation Note Date: 04/13/2019   Patient Name: Brooke Baker  DOB: Jul 30, 1934  MRN: [id number]  Age / Sex: 85 y.o., female  PCP: Einar Pheasant, MD Referring Physician: Lorella Nimrod, MD  Reason for Consultation: Establishing goals of care and Psychosocial/spiritual support  HPI/Patient Profile: 85 y.o. female  with past medical history of COPD/ILD with chronic hypoxic respiratory failure on 2 L, CAD sp PCI, proximal A. fib on Pradaxa, chronic diastolic heart failure EF of 55%, history of CVA, DM 2, HTN/HLD, macrocytic anemia/thrombocytopenia, seizure disorder, depression supplemental O2 admitted on 04/06/2019 with acute on chronic respiratory failure/chronic diastolic heart failure/COPD, acute kidney injury.   Clinical Assessment and Goals of Care: Brooke Baker is resting quietly in bed.  She greets me, making and keeping eye contact.  She is alert and oriented, able to make her basic needs known.  There is no family at bedside at this time.  We talked about her acute and chronic health concerns.  We talked about her pleurocentesis.  [name] tells me that she would accept pleurocentesis again if needed.  We talked about recurrence, hopefully the fluid does not build up again quickly.  We talked about Pleurx drain in the future if needed.  We talk about bacteremia treatments. We talked about home health services and equipment, questions answered.  We talked about transportation, which she would like an ambulance ride to home.  At this point, she feels that her husband and daughter can get her husband..  We talked about healthcare power of attorney.  She tells me that her husband along with her adult daughters, Brooke Baker and [name], would be her surrogate decision makers.  She also has a son, but he is not able to help.  We talked about CODE STATUS, and Brooke Baker elects to "treat the treatable" but  allowing natural death, no CPR or intubation.  She states that she has talked to her family about her wishes, and they are in agreement.  Conference with transition of care team related to patient condition, needs, disposition, DNR status.  HCPOA   NEXT OF KIN -husband, Brooke Baker, and adult daughters Brooke Baker and Brooke Baker.   SUMMARY OF RECOMMENDATIONS   Treat the treatable but no CPR, no intubation. Home with home health services, [facility]. Equipment needs managed by [facility] team Agreeable to outpatient palliative services  Code Status/Advance Care Planning:  DNR -states her family would agree with DNR  Symptom Management:   Per hospitalist, no additional needs at this time.  Palliative Prophylaxis:   Frequent Pain Assessment  Additional Recommendations (Limitations, Scope, Preferences):  Treat the treatable but no CPR or intubation  Psycho-social/Spiritual:   Desire for further Chaplaincy support:no  Additional Recommendations: Caregiving  Support/Resources and Education on Hospice  Prognosis:   Unable to determine, 6 months or less would not be surprising based on chronic disease burden, poor functional status.  Discharge Planning: Home with [facility] CHF program and out patient palliative      Primary Diagnoses: Present on Admission: .  Thrombocytopenia ([place]) . Macrocytic anemia . ILD (interstitial lung disease) ([name]) . Hyperlipidemia associated with type 2 diabetes mellitus ([name]) . Chronic respiratory failure ([name]) . AF (paroxysmal atrial fibrillation) ([name]) . Hypertension associated with diabetes ([name])   I have reviewed the medical record, interviewed the patient and family, and examined the patient. The following aspects are pertinent.  Past Medical History:  Diagnosis Date  . Acute bronchitis   . Allergic rhinitis   . Arthritis   . CAD (coronary artery disease)    s/p stent LAD 8/03 and stent placement OM1  . Cancer ([place])    skin   . Chronic back pain   . COPD with asthma ([name])   . CVA (cerebral vascular accident) ([name])   . Diabetes mellitus ([name])   . Hypercholesterolemia   . Hypertension   . Seizure disorder [facility])    Social History   Socioeconomic History  . Marital status: Married    Spouse name: Not on file  . Number of children: 3  . Years of education: hs  . Highest education level: Not on file  Occupational History  . Occupation: retired Psychologist, sport and exercise: RETIRED  Tobacco Use  . Smoking status: Never Smoker  . Smokeless tobacco: Never Used  Substance and Sexual Activity  . Alcohol use: No    Alcohol/week: 0.0 standard drinks  . Drug use: No  . Sexual activity: Never  Other Topics Concern  . Not on file  Social History Narrative   lives with husband- Brooke Baker   Social Determinants of Health   Financial Resource Strain:   . Difficulty of Paying Living Expenses:   Food Insecurity:   . Worried About Charity fundraiser in the Last Year:   . Arboriculturist in the Last Year:   Transportation Needs:   . Film/video editor (Medical):   [name] Lack of Transportation (Non-Medical):   Physical Activity:   . Days of Exercise per Week:   . Minutes of Exercise per Session:   Stress:   . Feeling of Stress :   Social Connections:   . Frequency of Communication with Friends and Family:   . Frequency of Social Gatherings with Friends and Family:   . Attends Religious Services:   . Active Member of Clubs or Organizations:   . Attends Archivist Meetings:   [name] Marital Status:    Family History  Problem Relation Age of Onset  . Allergies Brother   . Cancer Brother   . Allergies Sister   . Cancer Sister        Non-hodgkins lymphoma  . Asthma Sister   . Rheum arthritis Sister   . Stroke Mother   . Asthma Brother   . Rheum arthritis Brother   . Cancer Sister        non-hodgkins lymphoma  . Diabetes Brother    Scheduled Meds: . apixaban  2.5 mg Oral BID  . Chlorhexidine  Gluconate Cloth  6 each Topical Q0600  . feeding supplement (ENSURE ENLIVE)  237 mL Oral BID BM  . furosemide  60 mg Intravenous BID  . ipratropium-albuterol  3 mL Nebulization Q4H  . levETIRAcetam  500 mg Oral BID  . methylPREDNISolone (SOLU-MEDROL) injection  60 mg Intravenous Daily  . metoprolol succinate  25 mg Oral Daily  . mometasone-formoterol  2 puff Inhalation BID  . phenytoin  300 mg Oral QHS   And  . phenytoin  50 mg Oral QHS  .  potassium chloride  20 mEq Oral Daily  . rosuvastatin  20 mg Oral q1800  . sertraline  50 mg Oral QHS  . sodium chloride flush  3 mL Intravenous Q12H   Continuous Infusions: . linezolid (ZYVOX) IV 600 mg (04/13/19 0942)   PRN Meds:.acetaminophen **OR** acetaminophen, albuterol, morphine injection, ondansetron **OR** ondansetron (ZOFRAN) IV Medications Prior to Admission:  Prior to Admission medications   Medication Sig Start Date End Date Taking? Authorizing Provider  albuterol (PROVENTIL) (2.5 MG/3ML) 0.083% nebulizer solution USE ONE VIAL VIA NEBULZIER EVERY FOUR HOURS AS NEEDED FOR WHEEZING 12/08/18  Yes Einar Pheasant, MD  albuterol (VENTOLIN HFA) 108 (90 Base) MCG/ACT inhaler USE 2 PUFFS EVERY 4 HOURS AS DIRECTED - RESCUE Patient taking differently: Inhale 2 puffs into the lungs every 4 (four) hours as needed for wheezing or shortness of breath.  12/08/18  Yes Einar Pheasant, MD  budesonide-formoterol (SYMBICORT) 80-4.5 MCG/ACT inhaler INHALE 2 PUFFS TWICE A DAY Patient taking differently: Inhale 2 puffs into the lungs 2 (two) times daily.  06/08/18  Yes Young, Tarri Fuller D, MD  dabigatran (PRADAXA) 150 MG CAPS Take 150 mg by mouth every 12 (twelve) hours.   Yes [provider]  gabapentin (NEURONTIN) 100 MG capsule TAKE ONE CAPSULE BY MOUTH TWICE A DAY Patient taking differently: Take 200 mg by mouth at bedtime.  03/10/19  Yes Einar Pheasant, MD  levETIRAcetam (KEPPRA) 750 MG tablet Take 1 tablet (750 mg total) by mouth 2 (two) times daily.  03/03/19  Yes Melvenia Beam, MD  metoprolol succinate (TOPROL-XL) 25 MG 24 hr tablet Take 1 tablet (25 mg total) by mouth daily. 03/30/19  Yes Lavina Hamman, MD  Omega-3 Fatty Acids (FISH OIL) 1000 MG CAPS Take 1,000 mg by mouth daily.    Yes [provider]  phenytoin (DILANTIN) 100 MG ER capsule Take 4 capsules (400 mg total) by mouth at bedtime. 03/03/19  Yes Melvenia Beam, MD  polyethylene glycol (MIRALAX / GLYCOLAX) 17 g packet Take 17 g by mouth daily as needed for mild constipation or moderate constipation.    Yes [provider]  potassium chloride (KLOR-CON) 10 MEQ tablet Take 1 tablet (10 mEq total) by mouth daily. 12/08/18  Yes Einar Pheasant, MD  rosuvastatin (CRESTOR) 20 MG tablet TAKE 1 TABLET BY MOUTH DAILY Patient taking differently: Take 20 mg by mouth daily.  03/10/19  Yes Einar Pheasant, MD  sertraline (ZOLOFT) 50 MG tablet Take 75 mg by mouth at bedtime.    Yes [provider]  vancomycin IVPB Inject 750 mg into the vein daily for 21 days. 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 Labs - Thursday:  BMP, CRP and vancomycin trough 03/29/19 04/19/19 Yes Lavina Hamman, MD   Allergies  Allergen Reactions  . Doxycycline Diarrhea and Nausea Only  . Methotrexate Rash   Review of Systems  Unable to perform ROS: Age    Physical Exam Vitals and nursing note reviewed.  Constitutional:      Appearance: She is normal weight.  Cardiovascular:     Rate and Rhythm: Normal rate.  Pulmonary:     Effort: Pulmonary effort is normal. No respiratory distress.  Abdominal:     Tenderness: There is no abdominal tenderness. There is no guarding.  Skin:    General: Skin is warm and dry.  Neurological:     Mental Status: She is alert and oriented  to person, place, and time.  Psychiatric:        Mood and Affect: Mood normal.        Behavior: Behavior  normal.     Vital Signs: BP 118/60   Baker 95   Temp 97.7 F (36.5 C) (Oral)   Resp 18   Ht 5\' 5"  (1.651 m)   Wt 92.9 kg   LMP 08/17/1965   SpO2 98%   BMI 34.08 kg/m  Pain Scale: 0-10 POSS *See Group Information*: S-Acceptable,Sleep, easy to arouse Pain Score: 0-No pain   SpO2: SpO2: 98 % O2 Device:SpO2: 98 % O2 Flow Rate: .O2 Flow Rate (L/min): 3.5 L/min  IO: Intake/output summary:   Intake/Output Summary (Last 24 hours) at 04/13/2019 1520 Last data filed at 04/13/2019 1418 Gross per 24 hour  Intake 285.81 ml  Output 2000 ml  Net -1714.19 ml    LBM: Last BM Date: 04/12/19 Baseline Weight: Weight: 79.4 kg Most recent weight: Weight: 92.9 kg     Palliative Assessment/Data:   Flowsheet Rows     Most Recent Value  Intake Tab  Referral Department  Hospitalist  Unit at Time of Referral  Med/Surg Unit  Palliative Care Primary Diagnosis  Cardiac  Date Notified  04/13/19  Palliative Care Type  New Palliative care  Date of Admission  04/06/19  Date first seen by Palliative Care  04/13/19  # of days Palliative referral response time  0 Day(s)  # of days IP prior to Palliative referral  7  Clinical Assessment  Palliative Performance Scale Score  40%  Pain Max last 24 hours  Not able to report  Pain Min Last 24 hours  Not able to report  Dyspnea Max Last 24 Hours  Not able to report  Dyspnea Min Last 24 hours  Not able to report  Psychosocial & Spiritual Assessment  Palliative Care Outcomes      Time In: 1410 Time Out: 1500 Time Total: 50 minutes  Greater than 50%  of this time was spent counseling and coordinating care related to the above assessment and plan.  Signed by: Drue Novel, NP   Please contact Palliative Medicine Team phone at [phone] for questions and concerns.  For individual provider: See Shea Evans

## 2019-04-14 ENCOUNTER — Inpatient Hospital Stay: Payer: Medicare PPO

## 2019-04-14 ENCOUNTER — Encounter: Payer: Self-pay | Admitting: Internal Medicine

## 2019-04-14 LAB — RENAL FUNCTION PANEL
Albumin: 2.4 g/dL — ABNORMAL LOW (ref 3.5–5.0)
Anion gap: 9 (ref 5–15)
BUN: 44 mg/dL — ABNORMAL HIGH (ref 8–23)
CO2: 26 mmol/L (ref 22–32)
Calcium: 7.2 mg/dL — ABNORMAL LOW (ref 8.9–10.3)
Chloride: 100 mmol/L (ref 98–111)
Creatinine, Ser: 2.3 mg/dL — ABNORMAL HIGH (ref 0.44–1.00)
GFR calc Af Amer: 22 mL/min — ABNORMAL LOW (ref >60)
GFR calc non Af Amer: 19 mL/min — ABNORMAL LOW (ref >60)
Glucose, Bld: 92 mg/dL (ref 70–99)
Phosphorus: 2.9 mg/dL (ref 2.5–4.6)
Potassium: 3.3 mmol/L — ABNORMAL LOW (ref 3.5–5.1)
Sodium: 135 mmol/L (ref 135–145)

## 2019-04-14 LAB — CBC
HCT: 23.1 % — ABNORMAL LOW (ref 36.0–46.0)
Hemoglobin: 7.5 g/dL — ABNORMAL LOW (ref 12.0–15.0)
MCH: 33.8 pg (ref 26.0–34.0)
MCHC: 32.5 g/dL (ref 30.0–36.0)
MCV: 104.1 fL — ABNORMAL HIGH (ref 80.0–100.0)
Platelets: 123 10*3/uL — ABNORMAL LOW (ref 150–400)
RBC: 2.22 MIL/uL — ABNORMAL LOW (ref 3.87–5.11)
RDW: 19.9 % — ABNORMAL HIGH (ref 11.5–15.5)
WBC: 5.8 10*3/uL (ref 4.0–10.5)
nRBC: 0 % (ref 0.0–0.2)

## 2019-04-14 LAB — PH, BODY FLUID: pH, Body Fluid: 7.5

## 2019-04-14 LAB — PROCALCITONIN: Procalcitonin: 0.32 ng/mL

## 2019-04-14 LAB — CYTOLOGY - NON PAP

## 2019-04-14 MED ORDER — LINEZOLID 600 MG PO TABS: 600.0000 mg | ORAL_TABLET | Freq: Two times a day (BID) | ORAL | 0 refills | Status: AC

## 2019-04-14 MED ORDER — LINEZOLID 600 MG PO TABS
600.0000 mg | ORAL_TABLET | Freq: Two times a day (BID) | ORAL | Status: DC
  Administered 2019-04-14: 600 mg via ORAL
  Filled 2019-04-14 (×2): qty 1

## 2019-04-14 MED ORDER — POTASSIUM CHLORIDE CRYS ER 20 MEQ PO TBCR: 20.0000 meq | EXTENDED_RELEASE_TABLET | Freq: Every day | ORAL | 1 refills | Status: AC

## 2019-04-14 MED ORDER — APIXABAN 2.5 MG PO TABS: 2.5000 mg | ORAL_TABLET | Freq: Two times a day (BID) | ORAL | 1 refills | Status: AC

## 2019-04-14 MED ORDER — FUROSEMIDE 40 MG PO TABS: 40.0000 mg | ORAL_TABLET | Freq: Every day | ORAL | 11 refills | Status: AC

## 2019-04-14 MED ORDER — LEVETIRACETAM 500 MG PO TABS: 500.0000 mg | ORAL_TABLET | Freq: Two times a day (BID) | ORAL | 1 refills | Status: AC

## 2019-04-14 NOTE — Progress Notes (Signed)
Pt for discharge home via ems this pm. Alert. No distress.  Called pts dtr tammy gilley and gave her report  and discussed  Discharge pland for her re  meds diet / activity and f/u. Packet in  Chart to go with pt.cm called ems for transport.

## 2019-04-14 NOTE — Discharge Instructions (Signed)
Pleural Effusion Pleural effusion is an abnormal buildup of fluid in the layers of tissue between the lungs and the inside of the chest (pleural space) The two layers of tissue that line the lungs and the inside of the chest are called pleura. Usually, there is no air in the space between the pleura, only a thin layer of fluid. Some conditions can cause a large amount of fluid to build up, which can cause the lung to collapse if untreated. A pleural effusion is usually caused by another disease that requires treatment. What are the causes? Pleural effusion can be caused by:  Heart failure.  Certain infections, such as pneumonia or tuberculosis.  Cancer.  A blood clot in the lung (pulmonary embolism).  Complications from surgery, such as from open heart surgery.  Liver disease (cirrhosis).  Kidney disease. What are the signs or symptoms? In some cases, pleural effusion may cause no symptoms. If symptoms are present, they may include:  Shortness of breath, especially when lying down.  Chest pain. This may get worse when taking a deep breath.  Fever.  Dry, long-lasting (chronic) cough.  Hiccups.  Rapid breathing. An underlying condition that is causing the pleural effusion (such as heart failure, pneumonia, blood clots, tuberculosis, or cancer) may also cause other symptoms. How is this diagnosed? This condition may be diagnosed based on:  Your symptoms and medical history.  A physical exam.  A chest X-ray.  A procedure to use a needle to remove fluid from the pleural space (thoracentesis). This fluid is tested.  Other imaging studies of the chest, such as ultrasound or CT scan. How is this treated? Depending on the cause of your condition, treatment may include:  Treating the underlying condition that is causing the effusion. When that condition improves, the effusion will also improve. Examples of treatment for underlying conditions include: ? Antibiotic medicines to  treat an infection. ? Diuretics or other heart medicines to treat heart failure.  Thoracentesis.  Placing a thin flexible tube under your skin and into your chest to continuously drain the effusion (indwelling pleural catheter).  Surgery to remove the outer layer of tissue from the pleural space (decortication).  A procedure to put medicine into the chest cavity to seal the pleural space and prevent fluid buildup (pleurodesis).  Chemotherapy and radiation therapy, if you have cancerous (malignant) pleural effusion. These treatments are typically used to treat cancer. They kill certain cells in the body. Follow these instructions at home:  Take over-the-counter and prescription medicines only as told by your health care provider.  Ask your health care provider what activities are safe for you.  Keep track of how long you are able to do mild exercise (such as walking) before you get short of breath. Write down this information to share with your health care provider. Your ability to exercise should improve over time.  Do not use any products that contain nicotine or tobacco, such as cigarettes and e-cigarettes. If you need help quitting, ask your health care provider.  Keep all follow-up visits as told by your health care provider. This is important. Contact a health care provider if:  The amount of time that you are able to do mild exercise: ? Decreases. ? Does not improve with time.  You have a fever. Get help right away if:  You are short of breath.  You develop chest pain.  You develop a new cough. Summary  Pleural effusion is an abnormal buildup of fluid in the layers   of tissue between the lungs and the inside of the chest.  Pleural effusion can have many causes, including heart failure, pulmonary embolism, infections, or cancer.  Symptoms of pleural effusion can include shortness of breath, chest pain, fever, long-lasting (chronic) cough, hiccups, or rapid  breathing.  Diagnosis often involves making images of the chest (such as with ultrasound or X-ray) and removing fluid (thoracentesis) to send for testing.  Treatment for pleural effusion depends on what underlying condition is causing it. This information is not intended to replace advice given to you by your health care provider. Make sure you discuss any questions you have with your health care provider. Document Revised: 12/04/2016 Document Reviewed: 08/27/2016 Elsevier Patient Education  2020 [place].   Acute Kidney Injury, Adult  Acute kidney injury is a sudden worsening of kidney function. The kidneys are organs that have several jobs. They filter the blood to remove waste products and extra fluid. They also maintain a healthy balance of minerals and hormones in the body, which helps control blood pressure and keep bones strong. With this condition, your kidneys do not do their jobs as well as they should. This condition ranges from mild to severe. Over time it may develop into long-lasting (chronic) kidney disease. Early detection and treatment may prevent acute kidney injury from developing into a chronic condition. What are the causes? Common causes of this condition include:  A problem with blood flow to the kidneys. This may be caused by: ? Low blood pressure (hypotension) or shock. ? Blood loss. ? Heart and blood vessel (cardiovascular) disease. ? Severe burns. ? Liver disease.  Direct damage to the kidneys. This may be caused by: ? Certain medicines. ? A kidney infection. ? Poisoning. ? Being around or in contact with toxic substances. ? A surgical wound. ? A hard, direct hit to the kidney area.  A sudden blockage of urine flow. This may be caused by: ? Cancer. ? Kidney stones. ? An enlarged prostate in males. What are the signs or symptoms? Symptoms of this condition may not be obvious until the condition becomes severe. Symptoms of this condition can  include:  Tiredness (lethargy), or difficulty staying awake.  Nausea or vomiting.  Swelling (edema) of the face, legs, ankles, or feet.  Problems with urination, such as: ? Abdominal pain, or pain along the side of your stomach (flank). ? Decreased urine production. ? Decrease in the force of urine flow.  Muscle twitches and cramps, especially in the legs.  Confusion or trouble concentrating.  Loss of appetite.  Fever. How is this diagnosed? This condition may be diagnosed with tests, including:  Blood tests.  Urine tests.  Imaging tests.  A test in which a sample of tissue is removed from the kidneys to be examined under a microscope (kidney biopsy). How is this treated? Treatment for this condition depends on the cause and how severe the condition is. In mild cases, treatment may not be needed. The kidneys may heal on their own. In more severe cases, treatment will involve:  Treating the cause of the kidney injury. This may involve changing any medicines you are taking or adjusting your dosage.  Fluids. You may need specialized IV fluids to balance your body's needs.  Having a catheter placed to drain urine and prevent blockages.  Preventing problems from occurring. This may mean avoiding certain medicines or procedures that can cause further injury to the kidneys. In some cases treatment may also require:  A procedure to remove  toxic wastes from the body (dialysis or continuous renal replacement therapy - CRRT).  Surgery. This may be done to repair a torn kidney, or to remove the blockage from the urinary system. Follow these instructions at home: Medicines  Take over-the-counter and prescription medicines only as told by your health care provider.  Do not take any new medicines without your health care provider's approval. Many medicines can worsen your kidney damage.  Do not take any vitamin and mineral supplements without your health care provider's approval.  Many nutritional supplements can worsen your kidney damage. Lifestyle  If your health care provider prescribed changes to your diet, follow them. You may need to decrease the amount of protein you eat.  Achieve and maintain a healthy weight. If you need help with this, ask your health care provider.  Start or continue an exercise plan. Try to exercise at least 30 minutes a day, 5 days a week.  Do not use any tobacco products, such as cigarettes, chewing tobacco, and e-cigarettes. If you need help quitting, ask your health care provider. General instructions  Keep track of your blood pressure. Report changes in your blood pressure as told by your health care provider.  Stay up to date with immunizations. Ask your health care provider which immunizations you need.  Keep all follow-up visits as told by your health care provider. This is important. Where to find more information  American Association of Kidney Patients: BombTimer.gl  National Kidney Foundation: www.kidney.[place]: https://mathis.com/  Life Options Rehabilitation Program: ? www.lifeoptions.org ? www.kidneyschool.org Contact a health care provider if:  Your symptoms get worse.  You develop new symptoms. Get help right away if:  You develop symptoms of worsening kidney disease, which include: ? Headaches. ? Abnormally dark or light skin. ? Easy bruising. ? Frequent hiccups. ? Chest pain. ? Shortness of breath. ? End of menstruation in women. ? Seizures. ? Confusion or altered mental status. ? Abdominal or back pain. ? Itchiness.  You have a fever.  Your body is producing less urine.  You have pain or bleeding when you urinate. Summary  Acute kidney injury is a sudden worsening of kidney function.  Acute kidney injury can be caused by problems with blood flow to the kidneys, direct damage to the kidneys, and sudden blockage of urine flow.  Symptoms of this condition may not be obvious  until it becomes severe. Symptoms may include edema, lethargy, confusion, nausea or vomiting, and problems passing urine.  This condition can usually be diagnosed with blood tests, urine tests, and imaging tests. Sometimes a kidney biopsy is done to diagnose this condition.  Treatment for this condition often involves treating the underlying cause. It is treated with fluids, medicines, dialysis, diet changes, or surgery. This information is not intended to replace advice given to you by your health care provider. Make sure you discuss any questions you have with your health care provider. Document Revised: 12/04/2016 Document Reviewed: 12/13/2015 Elsevier Patient Education  2020 [place]. Follow Up with Nephrology in 1-3 weeks  PREFER  Dr Holley Raring

## 2019-04-14 NOTE — Progress Notes (Signed)
Pt left via ems at this time. Alert. No distress.dtr tammy notified  Was on the way home.

## 2019-04-14 NOTE — Care Management Important Message (Signed)
Important Message  Patient Details  Name: Brooke Baker MRN: [id number] Date of Birth: September 17, 1934   Medicare Important Message Given:  Yes     Su Hilt, RN 04/14/2019, 2:38 PM

## 2019-04-14 NOTE — Progress Notes (Signed)
[facility]  ROUNDING NOTE   Subjective:   Patient is feeling much better.   Changed to linezolid PO   Objective:  Vital signs in last 24 hours:  Temp:  [97.9 F (36.6 C)-98.3 F (36.8 C)] 97.9 F (36.6 C) (04/09 0808) Pulse Rate:  [86-99] 99 (04/09 0808) Resp:  [16-18] 18 (04/09 0808) BP: (101-112)/(41-53) 111/50 (04/09 0808) SpO2:  [93 %-99 %] 95 % (04/09 1311) Weight:  [92.6 kg] 92.6 kg (04/09 0508)  Weight change: -0.3 kg Filed Weights   04/11/19 0500 04/13/19 0553 04/14/19 0508  Weight: 100.8 kg 92.9 kg 92.6 kg    Intake/Output: I/O last 3 completed shifts: In: 288.8 [I.V.:3; IV Piggyback:285.8] Out: 1800 [Urine:1800]   Intake/Output this shift:  No intake/output data recorded.  Physical Exam: General: NAD,   Head: Normocephalic, atraumatic. Moist oral mucosal membranes  Eyes: Anicteric, PERRL  Neck: Supple, trachea midline  Lungs:  Crackles at bases bilaterally  Heart: Regular rate and rhythm  Abdomen:  Soft, nontender,   Extremities:  + peripheral edema.  Neurologic: Nonfocal, moving all four extremities  Skin: No lesions        Basic Metabolic Panel: Recent Labs  Lab 04/10/19 0452 04/10/19 0452 04/11/19 1049 04/11/19 1049 04/12/19 0815 04/13/19 1039 04/14/19 0831  NA 136  --  138  --  136 137 135  K 4.3  --  4.0  --  3.3* 3.8 3.3*  CL 105  --  104  --  101 102 100  CO2 27  --  25  --  27 26 26   GLUCOSE 102*  --  112*  --  100* 121* 92  BUN 42*  --  39*  --  38* 41* 44*  CREATININE 2.47*  --  2.31*  --  2.29* 2.16* 2.30*  CALCIUM 8.0*   < > 8.2*   < > 7.6* 7.6* 7.2*  PHOS  --   --  3.2  --  3.5 3.3 2.9   < > = values in this interval not displayed.    Liver Function Tests: Recent Labs  Lab 04/11/19 1049 04/12/19 0815 04/13/19 1039 04/14/19 0831  ALBUMIN 2.9* 2.5* 2.4* 2.4*   No results for input(s): LIPASE, AMYLASE in the last 168 hours. No results for input(s): AMMONIA in the last 168 hours.  CBC: Recent Labs   Lab 04/10/19 0452 04/11/19 1049 04/12/19 0815 04/13/19 1039 04/14/19 0829  WBC 7.9 8.5 8.1 6.1 5.8  HGB 9.1* 8.7* 8.7* 8.0* 7.5*  HCT 28.2* 27.4* 26.7* 24.7* 23.1*  MCV 103.7* 105.4* 102.3* 102.9* 104.1*  PLT 119* 108* 98* 102* 123*    Cardiac Enzymes: Recent Labs  Lab 04/10/19 0452  CKTOTAL 30*    BNP: Invalid input(s): POCBNP  CBG: Recent Labs  Lab 04/09/19 1152  GLUCAP 103*    Microbiology: Results for orders placed or performed during the hospital encounter of 04/06/19  SARS CORONAVIRUS 2 (TAT 6-24 HRS) Nasopharyngeal Nasopharyngeal Swab     Status: None   Collection Time: 04/06/19 11:32 PM   Specimen: Nasopharyngeal Swab  Result Value Ref Range Status   SARS Coronavirus 2 NEGATIVE NEGATIVE Final    Comment: (NOTE) SARS-CoV-2 target nucleic acids are NOT DETECTED. The SARS-CoV-2 RNA is generally detectable in upper and lower respiratory specimens during the acute phase of infection. Negative results do not preclude SARS-CoV-2 infection, do not rule out co-infections with other pathogens, and should not be used as the sole basis for treatment or other patient  management decisions. Negative results must be combined with clinical observations, patient history, and epidemiological information. The expected result is Negative. Fact Sheet for Patients: SugarRoll.be Fact Sheet for Healthcare Providers: https://www.woods-mathews.com/ This test is not yet approved or cleared by the [place] FDA and  has been authorized for detection and/or diagnosis of SARS-CoV-2 by FDA under an Emergency Use Authorization (EUA). This EUA will remain  in effect (meaning this test can be used) for the duration of the COVID-19 declaration under Section 56 4(b)(1) of the Act, 21 U.S.C. section 360bbb-3(b)(1), unless the authorization is terminated or revoked sooner. Performed at [facility], [place] [address] [place],  [place] [address]   CULTURE, BLOOD (ROUTINE X 2) w Reflex to ID Panel     Status: None   Collection Time: 04/07/19  3:54 PM   Specimen: BLOOD  Result Value Ref Range Status   Specimen Description BLOOD BLOOD RIGHT HAND  Final   Special Requests AEROBIC BOTTLE ONLY Blood Culture adequate volume  Final   Culture   Final    NO GROWTH 5 DAYS Performed at [facility], [address] [place], [place] [address]    Report Status 04/12/2019 FINAL  Final  CULTURE, BLOOD (ROUTINE X 2) w Reflex to ID Panel     Status: None   Collection Time: 04/07/19  5:21 PM   Specimen: BLOOD  Result Value Ref Range Status   Specimen Description BLOOD BLOOD RIGHT HAND  Final   Special Requests   Final    BOTTLES DRAWN AEROBIC AND ANAEROBIC Blood Culture adequate volume   Culture   Final    NO GROWTH 5 DAYS Performed at [facility], [address] [place], [place] [address]    Report Status 04/12/2019 FINAL  Final  Body fluid culture     Status: None (Preliminary result)   Collection Time: 04/12/19  3:53 PM   Specimen: PATH Cytology Pleural fluid  Result Value Ref Range Status   Specimen Description   Final    PLEURAL Performed at [facility], [address], [place], [place] [address]    Special Requests   Final    PLEURAL Performed at [facility], [address] [place], [place] [address]    Gram Stain   Final    ABUNDANT WBC PRESENT,BOTH PMN AND MONONUCLEAR NO ORGANISMS SEEN    Culture   Final    NO GROWTH 2 DAYS Performed at [facility], [place] [address], [place],  [address]    Report Status PENDING  Incomplete    Coagulation Studies: No results for input(s): LABPROT, INR in the last 72 hours.  Urinalysis: No results for input(s): COLORURINE, LABSPEC, PHURINE, GLUCOSEU, HGBUR, BILIRUBINUR, KETONESUR, PROTEINUR, UROBILINOGEN, NITRITE, LEUKOCYTESUR in the last 72 hours.  Invalid input(s): APPERANCEUR    Imaging: DG Chest Port 1  View  Result Date: 04/12/2019 CLINICAL DATA:  Post right-sided thoracentesis EXAM: PORTABLE CHEST 1 VIEW COMPARISON:  Earlier same day; 04/11/2019; 04/10/2019; chest CT-03/26/2019 FINDINGS: Grossly unchanged cardiac silhouette and mediastinal contours with atherosclerotic plaque within the thoracic aorta. Stable position of support apparatus. No change to slight reduction in persistent small right-sided effusion post thoracentesis. No pneumothorax. Unchanged small left-sided pleural effusion associated left basilar opacities. Rather extensive bilateral slightly nodular interstitial airspace opacities are grossly unchanged. No new focal airspace opacities. No acute osseous abnormalities. IMPRESSION: 1. No change to slight reduction of persistent small right-sided effusion post thoracentesis. No pneumothorax. 2. Grossly unchanged  rather extensive bilateral slightly nodular interstitial airspace opacities, potentially alveolar pulmonary edema though multifocal infection could have a similar appearance. Clinical correlation is advised. 3. Grossly unchanged small left-sided effusion associated left basilar opacities Electronically Signed   By: Sandi Mariscal M.D.   On: 04/12/2019 15:58   US THORACENTESIS ASP PLEURAL SPACE W/IMG GUIDE  Result Date: 04/12/2019 INDICATION: Symptomatic bilateral pleural effusions. Please perform ultrasound-guided thoracentesis of dominant pleural effusion for diagnostic and therapeutic purposes. EXAM: US THORACENTESIS ASP PLEURAL SPACE W/IMG GUIDE COMPARISON:  Chest radiograph-earlier same day; 04/11/2019; 04/10/2019; chest CT-03/26/2019 MEDICATIONS: None. COMPLICATIONS: None immediate. TECHNIQUE: Informed written consent was obtained from the patient after a discussion of the risks, benefits and alternatives to treatment. A timeout was performed prior to the initiation of the procedure. Initial ultrasound scanning demonstrates a moderate to large sized bilateral pleural effusions, right  slightly greater than left. As such, decision was made to proceed with right-sided thoracentesis. The right posteroinferior lateral chest was prepped and draped in the usual sterile fashion. 1% lidocaine was used for local anesthesia. An ultrasound image was saved for documentation purposes. An 8 Fr Safe-T-Centesis catheter was introduced. The thoracentesis was performed. The catheter was removed and a dressing was applied. The patient tolerated the procedure well without immediate post procedural complication. The patient was escorted to have an upright chest radiograph. FINDINGS: A total of approximately 900 cc of blood tinged serous pleural fluid was removed. Requested samples were sent to the laboratory. IMPRESSION: Successful ultrasound-guided right sided thoracentesis yielding 900 cc of blood tinged serous pleural fluid. Postprocedural x-ray was negative for pneumothorax. Electronically Signed   By: Sandi Mariscal M.D.   On: 04/12/2019 16:05     Medications:    . apixaban  2.5 mg Oral BID  . Chlorhexidine Gluconate Cloth  6 each Topical Q0600  . feeding supplement (ENSURE ENLIVE)  237 mL Oral BID BM  . ipratropium-albuterol  3 mL Nebulization TID  . levETIRAcetam  500 mg Oral BID  . linezolid  600 mg Oral Q12H  . methylPREDNISolone (SOLU-MEDROL) injection  60 mg Intravenous Daily  . metoprolol succinate  25 mg Oral Daily  . mometasone-formoterol  2 puff Inhalation BID  . phenytoin  300 mg Oral QHS   And  . phenytoin  50 mg Oral QHS  . potassium chloride  20 mEq Oral Daily  . rosuvastatin  20 mg Oral q1800  . sertraline  50 mg Oral QHS  . sodium chloride flush  3 mL Intravenous Q12H   acetaminophen **OR** acetaminophen, albuterol, morphine injection, ondansetron **OR** ondansetron (ZOFRAN) IV  Assessment/ Plan:  Ms. Brooke Baker is a 85 y.o. white female with COPD, interstitial lung disease, coronary artery disease, atrial fibrillation, diastolic congestive heart failure, CVA,  diabetes mellitus type 2, hypertension, hyperlipidemia, seizure disorder, thrombocytopenia, depression, who was admitted to [facility] on 04/06/2019 for Acute renal failure (ARF) ([place]) [N17.9] AKI (acute kidney injury) ([place]) [N17.9]  1.  Acute kidney injury with hyperkalemia on admission.  Baseline creatinine 0.8 with normal range GFR.  Was on potassium chloride on admission. Restarted: potassium is hypokalemic   Acute kidney injury secondary to ATN from vancomycin, ibuprofen and IV contrast exposure on 3/21.  Creatinine continues to improve  2. Bacteremia: MRSA. with vancomycin toxicity.  - started on linezolid  - Appreciate ID input.  3. Hypoalbuminemia: secondary to poor nutritional status. Cause of her peripheral edema.  - Reduce IV furosemide to 40mg  IV q12.   4. Anemia with renal failure:  hemoglobin 7.5, macrocytic. Status post PRBC transfusion on 4/4 Iron studies, vitamin B12 and folate are at goal. SPEP/UPEP negative from 01/30/19.   5. Hypertension - metoprolol and furosemide.   6. Pleural effusions: right sided thoracentesis on 4/7 with 982mL removed. Transudative.  - Appreciate pulmonary input.    LOS: 8 [place] 4/9/20211:46 PM

## 2019-04-14 NOTE — Progress Notes (Signed)
Palliative: Mrs. Furmanski is resting quietly in bed.  She greets me making and mostly keeping eye contact.  She is alert and oriented, able to make her basic needs known.  There is no family at bedside at this time.  We briefly talked about her visit with her family last night, touching on CODE STATUS.  Although she continues to endorse, "if I die, I die. I am ready", she would like to continue discussions with her family.  Out patient palliative to follow.   Conference with attending, bedside nursing staff, and transition of care team.  Plan:   Outpatient palliative to follow at home to continue goals of care, CODE STATUS discussions.  43 minutes Quinn Axe, NP Palliative Medicine Team Team Phone # [phone] Greater than 50% of this time was spent counseling and coordinating care related to the above assessment and plan.

## 2019-04-14 NOTE — TOC Progression Note (Signed)
Transition of Care [facility]) - Progression Note    Patient Details  Name: Brooke Baker MRN: [id number] Date of Birth: 26-May-1934  Transition of Care [facility]) CM/SW [place], RN Phone Number: 04/14/2019, 3:13 PM  Clinical Narrative:    The bedside nurse is calling the daughter to go over DC instructions, RNCM has called the EMS to transport home, Bedside Nurse is aware   Expected Discharge Plan: [place] Barriers to Discharge: Continued Medical Work up  Expected Discharge Plan and Services Expected Discharge Plan: [place]   Discharge Planning Services: CM Consult   Living arrangements for the past 2 months: Single Family Home Expected Discharge Date: 04/14/19               DME Arranged: N/A         HH Arranged: RN, PT HH Agency: [facility] (formerly [employer]) Date [place]: 04/11/19 Time [place]: [phone] Representative spoke with at [place]: [place] ([place]) Interventions    Readmission Risk Interventions No flowsheet data found.

## 2019-04-14 NOTE — Progress Notes (Signed)
[facility] for Eliquis (apixaban)  Indication: atrial fibrillation  Allergies  Allergen Reactions  . Doxycycline Diarrhea and Nausea Only  . Methotrexate Rash    Patient Measurements: Height: 5\' 5"  (165.1 cm) Weight: 92.6 kg (204 lb 2.3 oz) IBW/kg (Calculated) : 57  Vital Signs: Temp: 97.9 F (36.6 C) (04/09 0808) Temp Source: Oral (04/09 0808) BP: 111/50 (04/09 0808) Pulse Rate: 99 (04/09 0808)  Labs: Recent Labs    04/11/19 1305 04/12/19 0815 04/12/19 0815 04/13/19 1039 04/14/19 0829 04/14/19 0831  HGB  --  8.7*   < > 8.0* 7.5*  --   HCT  --  26.7*  --  24.7* 23.1*  --   PLT  --  98*  --  102* 123*  --   CREATININE  --  2.29*  --  2.16*  --  2.30*  TROPONINIHS 17  --   --   --   --   --    < > = values in this interval not displayed.    Estimated Creatinine Clearance: 21.2 mL/min (A) (by C-G formula based on SCr of 2.3 mg/dL (H)).   Assessment: Pharmacy consulted to dose apixaban for Afib in this 85 year old female admitted with ARF.   Plan:  Age > 80, Scr 2.30 - will continue apixaban 2.5 mg PO bid   Will follow CBC/Scr a minimum of every 3 days per protocol.   Rocky Morel, PharmD, BCPS Clinical Pharmacist 04/14/2019 12:16 PM

## 2019-04-14 NOTE — Discharge Summary (Signed)
Physician Discharge Summary  WILLOWDEAN ZOCCHI [id number] DOB: [birth year] DOA: 04/06/2019  PCP: Einar Pheasant, MD  Admit date: 04/06/2019 Discharge date: 04/14/2019  Admitted From: Home Disposition:  Home  Recommendations for Outpatient Follow-up:  1. Follow up with PCP in 1-2 weeks 2. Follow-up with nephrology. 3. Follow-up with ID 4. Please obtain BMP/CBC in one week 5. Please follow up on the following pending results: Acid-fast bacilli on pleural fluid.  Home Health: Yes Equipment/Devices: Hospital bed, Hoyer lift, bedside commode, rolling walker. Home oxygen. Discharge Condition: Fair but guarded CODE STATUS: Full Diet recommendation: Heart Healthy / Carb Modified   Brief/Interim Summary: 85 year old female with COPD/interstitial lung disease with chronic hypoxic respiratory failure on 2 L home O2, CAD s/p PCI, paroxysmal A. fib on Pradaxa, chronic diastolic CHF, history of CVA, type 2 diabetes mellitus, hypertension, hyperlipidemia, thrombocytopenia, seizure disorder and depression who was recently hospitalized and discharged 1 week back secondary to MRSA bacteremia with pneumonia. She was started on IV vancomycin and plan to treat for 4-week course through a left arm PICC line. She had follow-up labs done as outpatient and found to have acute kidney injury and was told to hold the antibiotic. At baseline patient has chronic lower extremity swelling/puffiness. Also following hospital discharge patient had an episode of nausea with vomiting and poor p.o. intake. She was taking ibuprofen as needed for pain for almost 3 times a day recently. Daughter noticed that patient has been increasingly fatigued and sleepy last few days with some confusion. Admitted for further management of her ATN, which was thought to be due to vancomycin and patient also received contrast for CTA on 03/26/2019. Nephrology was consulted, she was given some IV fluid.  Renal function with mild improvement.   Creatinine on discharge day was 2.30. Her vancomycin was discontinued and she was initially started on daptomycin which was converted to linezolid due to concern for pulmonary penetration with daptomycin.  She was discharged for 3 more days of linezolid.  She will follow-up with infectious disease and her primary care.  Patient developed worsening dyspnea and respiratory distress during hospitalization.  Chest x-ray was concerning for worsening pulmonary edema and pleural effusions.  She was started on IV diuresis with minimum response.  She also underwent thoracentesis with removal of 900 cc.  Pleural fluid labs are consistent with transudate.  Cytology with eosinophils, no empyema. Her respiratory status improved with thoracentesis and IV Lasix.  IV Lasix discontinued after seeing a bump in her creatinine.  She will continue with p.o. Lasix of 40 mg daily at home and will follow up with nephrology as an outpatient. She will continue with her home dose of oxygen on discharge.  Chest x-ray on the day of discharge was stable.  Patient has an history of paroxysmal A. fib.  Most of the time remained in sinus rhythm.  She was on Pradaxa on admission which was discontinued due to AKI.  She was initially managed with heparin infusion and later started on renally dosed Eliquis.  She will continue her home dose of metoprolol and seizure medications.  Palliative care was consulted due to her multiple comorbidities.  She was initially decided to be DNR after discussion with her husband and patient.  Later daughter become very upset and she changed her status to full code again.  Palliative care will follow up as an outpatient for further discussion of goals of care and symptom management.  Discharge Diagnoses:  Principal Problem:   AKI (acute kidney injury) ([place])  Active Problems:   History of CVA (cerebrovascular accident)   Diabetes mellitus ([place])   Hypertension associated with diabetes ([place])   Seizure  disorder ([facility])   AF (paroxysmal atrial fibrillation) ([place])   Hyperlipidemia associated with type 2 diabetes mellitus ([place])   Chronic diastolic CHF (congestive heart failure) ([facility])   ILD (interstitial lung disease) ([facility])   Thrombocytopenia ([facility])   Macrocytic anemia   Chronic respiratory failure ([facility])   MRSA bacteremia   SOB (shortness of breath)   Goals of care, counseling/discussion   Palliative care by specialist   DNR (do not resuscitate) discussion  Discharge Instructions  Discharge Instructions    Diet - low sodium heart healthy   Complete by: As directed    Discharge instructions   Complete by: As directed    It was pleasure taking care of you. Please complete your antibiotics as directed and follow-up with infectious disease doctor. Your kidney doctor wants you to take Lasix 40 mg daily, please take it as directed and follow-up with them.   Increase activity slowly   Complete by: As directed      Allergies as of 04/14/2019      Reactions   Doxycycline Diarrhea, Nausea Only   Methotrexate Rash      Medication List    STOP taking these medications   dabigatran 150 MG Caps capsule Commonly known as: PRADAXA   potassium chloride 10 MEQ tablet Commonly known as: KLOR-CON   vancomycin  IVPB     TAKE these medications   albuterol (2.5 MG/3ML) 0.083% nebulizer solution Commonly known as: PROVENTIL USE ONE VIAL VIA NEBULZIER EVERY FOUR HOURS AS NEEDED FOR WHEEZING What changed: Another medication with the same name was changed. Make sure you understand how and when to take each.   albuterol 108 (90 Base) MCG/ACT inhaler Commonly known as: Ventolin HFA USE 2 PUFFS EVERY 4 HOURS AS DIRECTED - RESCUE What changed:   how much to take  how to take this  when to take this  reasons to take this  additional instructions   apixaban 2.5 MG Tabs tablet Commonly known as: ELIQUIS Take 1 tablet (2.5 mg total) by mouth 2 (two) times daily.   budesonide-formoterol  80-4.5 MCG/ACT inhaler Commonly known as: SYMBICORT INHALE 2 PUFFS TWICE A DAY   Fish Oil 1000 MG Caps Take 1,000 mg by mouth daily.   furosemide 40 MG tablet Commonly known as: Lasix Take 1 tablet (40 mg total) by mouth daily.   gabapentin 100 MG capsule Commonly known as: NEURONTIN TAKE ONE CAPSULE BY MOUTH TWICE A DAY What changed:   how much to take  when to take this   levETIRAcetam 500 MG tablet Commonly known as: KEPPRA Take 1 tablet (500 mg total) by mouth 2 (two) times daily. What changed:   medication strength  how much to take   linezolid 600 MG tablet Commonly known as: ZYVOX Take 1 tablet (600 mg total) by mouth every 12 (twelve) hours for 3 days.   metoprolol succinate 25 MG 24 hr tablet Commonly known as: TOPROL-XL Take 1 tablet (25 mg total) by mouth daily.   phenytoin 100 MG ER capsule Commonly known as: DILANTIN Take 4 capsules (400 mg total) by mouth at bedtime.   polyethylene glycol 17 g packet Commonly known as: MIRALAX / GLYCOLAX Take 17 g by mouth daily as needed for mild constipation or moderate constipation.   potassium chloride SA 20 MEQ tablet Commonly known as: KLOR-CON Take 1 tablet (  20 mEq total) by mouth daily. Start taking on: April 15, 2019   rosuvastatin 20 MG tablet Commonly known as: CRESTOR TAKE 1 TABLET BY MOUTH DAILY What changed:   how much to take  how to take this  when to take this  additional instructions   sertraline 50 MG tablet Commonly known as: ZOLOFT Take 75 mg by mouth at bedtime.            Durable Medical Equipment  (From admission, onward)         Start     Ordered   04/12/19 1306  For home use only DME Other see comment  Once    Comments: Harrel Lemon lift  Question:  Length of Need  Answer:  Lifetime   04/12/19 1305          Allergies  Allergen Reactions  . Doxycycline Diarrhea and Nausea Only  . Methotrexate Rash     Consultations:  Nephrology  Pulmonology  ID  Procedures/Studies: DG Chest 2 View  Result Date: 04/14/2019 CLINICAL DATA:  Shortness of breath.  Hypertension. EXAM: CHEST - 2 VIEW COMPARISON:  April 12, 2019 FINDINGS: Persistent small pleural effusion on each side. There is underlying fibrosis throughout the lungs. There is no evident new opacity heart size and pulmonary vascularity are normal. Central catheter tip is in the superior vena cava. No pneumothorax. There is aortic atherosclerosis. IMPRESSION: Small pleural effusions bilaterally. Underlying fibrosis. No new opacity evident. Stable cardiac silhouette. Aortic Atherosclerosis (ICD10-I70.0). Electronically Signed   By: Lowella Grip III M.D.   On: 04/14/2019 14:11   DG Chest 2 View  Result Date: 04/06/2019 CLINICAL DATA:  Worsening renal function. EXAM: CHEST - 2 VIEW COMPARISON:  Chest x-ray 03/26/2019, 04/20/2017.  CT 03/26/2019. FINDINGS: Left PICC line noted with tip over superior vena cava. Cardiomegaly with diffuse bilateral interstitial prominence, improved from prior studies. Findings suggest improving interstitial edema. Continued follow-up exam suggested. IMPRESSION: Left PICC line noted with tip over superior vena cava. 2. Cardiomegaly with diffuse bilateral tissue prominence, improved from prior studies. Findings suggest improving interstitial edema. Electronically Signed   By: Marcello Moores  Register   On: 04/06/2019 15:22   CT HEAD WO CONTRAST  Result Date: 03/19/2019 CLINICAL DATA:  85 year old female with fall. EXAM: CT HEAD WITHOUT CONTRAST TECHNIQUE: Contiguous axial images were obtained from the base of the skull through the vertex without intravenous contrast. COMPARISON:  Head CT dated 09/08/2018. FINDINGS: Brain: There is moderate age-related atrophy and chronic microvascular ischemic changes. There is no acute intracranial hemorrhage. No mass effect or midline shift. No extra-axial fluid collection. Vascular: No  hyperdense vessel or unexpected calcification. Skull: Normal. Negative for fracture or focal lesion. Sinuses/Orbits: There is opacification of the left frontal sinuses and maxillary sinuses with remodeling of the medial wall of the right maxillary sinus. Opacification of the left sphenoid sinuses. No air-fluid level. The mastoid air cells are clear. Other: None IMPRESSION: 1. No acute intracranial pathology. 2. Age-related atrophy and chronic microvascular ischemic changes. 3. Chronic paranasal sinus disease. Electronically Signed   By: Anner Crete M.D.   On: 03/19/2019 22:31   CT ANGIO CHEST PE W OR WO CONTRAST  Result Date: 03/26/2019 CLINICAL DATA:  Shortness of breath. Multiple medical problems. EXAM: CT ANGIOGRAPHY CHEST WITH CONTRAST TECHNIQUE: Multidetector CT imaging of the chest was performed using the standard protocol during bolus administration of intravenous contrast. Multiplanar CT image reconstructions and MIPs were obtained to evaluate the vascular anatomy. CONTRAST:  62mL OMNIPAQUE IOHEXOL  350 MG/ML SOLN COMPARISON:  01/26/2019 FINDINGS: Cardiovascular: Heart is markedly enlarged and stable in configuration. There is dense atherosclerotic calcification of the coronary arteries. Atherosclerosis of the thoracic aorta is not associated with aneurysm. Pulmonary arteries are well opacified and there is no evidence for acute pulmonary embolus. Mediastinum/Nodes: The visualized portion of the thyroid gland has a normal appearance. Esophagus is unremarkable. Numerous small mediastinal and hilar lymph nodes. Axillary regions are unremarkable. Lungs/Pleura: Moderate bilateral pleural effusions, RIGHT greater than LEFT. Dependent atelectasis/consolidation in the posterior LOWER lobes bilaterally. There are focal and confluent ground-glass opacities throughout the lungs, favoring pulmonary edema over infectious process. Upper Abdomen: Cirrhotic morphology of the liver. Spleen appears enlarged. Dilated  inferior vena cava consistent with RIGHT heart failure. Musculoskeletal: No chest wall abnormality. No acute or significant osseous findings. Review of the MIP images confirms the above findings. IMPRESSION: 1. Technically adequate exam showing no acute pulmonary embolus. 2. Cardiomegaly and changes of RIGHT heart failure. 3. Moderate bilateral pleural effusions, RIGHT greater than LEFT. 4. Focal and confluent ground-glass opacities throughout the lungs, favoring pulmonary edema over infectious process. 5. Cirrhotic morphology of the liver. 6. Splenomegaly. 7. Aortic Atherosclerosis (ICD10-I70.0). Electronically Signed   By: Nolon Nations M.D.   On: 03/26/2019 12:23   CT CERVICAL SPINE WO CONTRAST  Result Date: 03/23/2019 CLINICAL DATA:  Sepsis and lower extremity edema EXAM: CT CERVICAL SPINE WITHOUT CONTRAST TECHNIQUE: Multidetector CT imaging of the cervical spine was performed without intravenous contrast. Multiplanar CT image reconstructions were also generated. COMPARISON:  09/08/2018 FINDINGS: Alignment: Grade 1 anterolisthesis at C3-4 and C4-5. Reversal of normal cervical lordosis may be positional or due to muscle spasm. Skull base and vertebrae: No acute fracture. Vertebral body heights are maintained. Soft tissues and spinal canal: No prevertebral fluid or swelling. No visible canal hematoma. Disc levels:  No bony spinal canal stenosis. Upper chest: Bilateral pleural effusions. Other: None IMPRESSION: 1. No acute fracture or static subluxation of the cervical spine. 2. Bilateral pleural effusions. Electronically Signed   By: Ulyses Jarred M.D.   On: 03/23/2019 20:55   CT LUMBAR SPINE WO CONTRAST  Result Date: 03/23/2019 CLINICAL DATA:  Sepsis EXAM: CT LUMBAR SPINE WITHOUT CONTRAST TECHNIQUE: Multidetector CT imaging of the lumbar spine was performed without intravenous contrast administration. Multiplanar CT image reconstructions were also generated. COMPARISON:  CT myelogram 09/10/2004  FINDINGS: Segmentation: Normal Alignment: Grade 1 retrolisthesis at L2-3 and L3-4. Vertebrae: Unchanged chronic compression deformity of L2. L4-S1 posterior instrumented fusion. Paraspinal and other soft tissues: Calcific aortic atherosclerosis and bilateral pleural effusions. Horseshoe kidney. Disc levels: L2-3: Mild spinal canal stenosis due to combination of endplate spurring and disc bulge. Moderate facet hypertrophy. Neural foramina are patent. L3-4: Mild spinal canal stenosis due to disc bulge and facet hypertrophy. No stenosis. L4-5: Status post right laminectomy. No spinal canal or neural foraminal stenosis. L5-S1: Posterior decompression. No spinal canal stenosis. Moderate right and mild left neural foraminal stenosis, unchanged. IMPRESSION: 1. No acute fracture or static subluxation of the lumbar spine. Status post L4-S1 posterior instrumented fusion. No hardware adverse features. 2. Unchanged moderate right and mild left L5-S1 neural foraminal stenosis. 3. Mild L2-3 and L3-4 spinal canal stenosis. 4. Aortic Atherosclerosis (ICD10-I70.0). Electronically Signed   By: Ulyses Jarred M.D.   On: 03/23/2019 21:10   US RENAL  Result Date: 04/07/2019 CLINICAL DATA:  Acute renal failure EXAM: RENAL / URINARY TRACT ULTRASOUND COMPLETE COMPARISON:  None. FINDINGS: Right Kidney: Renal measurements: 10.2 x 3.9 x 3.5 cm. =  volume: 72 mL. Mild increased echogenicity is noted. 9 mm cyst is noted within the kidney in the lower pole. Left Kidney: Renal measurements: 10.1 x 4.3 x 3.9 cm. = volume: 88.4 mL. Mild increased echogenicity is noted. No mass lesion or hydronephrosis is noted. Bladder: Appears normal for degree of bladder distention. Other: Gallstones are seen.  Small effusions are noted bilaterally. IMPRESSION: Mild increased echogenicity within the kidneys bilaterally consistent with medical renal disease. Small right renal cyst. Cholelithiasis. Bilateral pleural effusions. Electronically Signed   By: Inez Catalina M.D.   On: 04/07/2019 00:59   DG Chest Port 1 View  Result Date: 04/12/2019 CLINICAL DATA:  Post right-sided thoracentesis EXAM: PORTABLE CHEST 1 VIEW COMPARISON:  Earlier same day; 04/11/2019; 04/10/2019; chest CT-03/26/2019 FINDINGS: Grossly unchanged cardiac silhouette and mediastinal contours with atherosclerotic plaque within the thoracic aorta. Stable position of support apparatus. No change to slight reduction in persistent small right-sided effusion post thoracentesis. No pneumothorax. Unchanged small left-sided pleural effusion associated left basilar opacities. Rather extensive bilateral slightly nodular interstitial airspace opacities are grossly unchanged. No new focal airspace opacities. No acute osseous abnormalities. IMPRESSION: 1. No change to slight reduction of persistent small right-sided effusion post thoracentesis. No pneumothorax. 2. Grossly unchanged rather extensive bilateral slightly nodular interstitial airspace opacities, potentially alveolar pulmonary edema though multifocal infection could have a similar appearance. Clinical correlation is advised. 3. Grossly unchanged small left-sided effusion associated left basilar opacities Electronically Signed   By: Sandi Mariscal M.D.   On: 04/12/2019 15:58   DG Chest Port 1 View  Result Date: 04/12/2019 CLINICAL DATA:  Shortness of breath EXAM: PORTABLE CHEST 1 VIEW COMPARISON:  Yesterday FINDINGS: Stable heart size and mediastinal contours. Diffuse interstitial opacity with patchy airspace density and small pleural effusions. The opacity could be a combination of pneumonia and edema. Left PICC with tip at the SVC. IMPRESSION: Stable widespread airspace disease and pleural effusions Electronically Signed   By: Monte Fantasia M.D.   On: 04/12/2019 08:23   DG Chest Port 1 View  Result Date: 04/11/2019 CLINICAL DATA:  Shortness of breath. EXAM: PORTABLE CHEST 1 VIEW COMPARISON:  Single-view of the chest 04/10/2019, 03/25/2019. PA and  lateral chest 04/20/2017 and 04/06/2019. FINDINGS: Left PICC is again seen. Cardiomegaly and bilateral airspace disease persist. Atherosclerosis noted. Aeration has worsened. Small pleural effusions have increased. Right effusion is larger. There is cardiomegaly. Atherosclerosis. IMPRESSION: Worsened bilateral airspace disease and right greater than left pleural effusions compatible with multifocal pneumonia or pulmonary edema. Atherosclerosis. Electronically Signed   By: Inge Rise M.D.   On: 04/11/2019 12:24   DG Chest Port 1 View  Result Date: 04/10/2019 CLINICAL DATA:  Shortness of breath. EXAM: PORTABLE CHEST 1 VIEW COMPARISON:  04/06/2019 FINDINGS: 1152 hours. Lungs appear hyperexpanded. Interval progression of the diffuse interstitial and patchy right greater than left airspace disease. Cardiopericardial silhouette is at upper limits of normal for size. Left PICC line tip overlies the mid to distal SVC. Bones are diffusely demineralized. IMPRESSION: 1. Worsening diffuse interstitial and patchy right greater than left airspace disease. Asymmetric pulmonary edema versus diffuse infection. Electronically Signed   By: Misty Stanley M.D.   On: 04/10/2019 12:08   DG Chest Port 1 View  Result Date: 03/26/2019 CLINICAL DATA:  Shortness of breath and chest pain. EXAM: PORTABLE CHEST 1 VIEW COMPARISON:  March 25, 2019 FINDINGS: Stable cardiomegaly. Diffuse interstitial opacity on the left and patchy infiltrate on the right are stable. No interval changes. IMPRESSION: No interval  change in bilateral pulmonary opacities. Electronically Signed   By: Dorise Bullion III M.D   On: 03/26/2019 10:28   DG Chest Port 1 View  Result Date: 03/25/2019 CLINICAL DATA:  Community acquired pneumonia. Patient states having a cough and some sob today. Hx of bronchitis, CAD, COPD. Never smoker. EXAM: PORTABLE CHEST 1 VIEW COMPARISON:  Chest radiograph 03/22/2019 FINDINGS: Stable position of a left upper extremity PICC.  Unchanged cardiomediastinal contours. There are diffuse bilateral interstitial and airspace opacities, not significantly changed. Probable small bilateral effusions. No pneumothorax. No acute finding in the visualized skeleton. IMPRESSION: Stable diffuse bilateral interstitial and airspace opacities. Electronically Signed   By: Audie Pinto M.D.   On: 03/25/2019 17:53   DG Chest Port 1 View  Result Date: 03/22/2019 CLINICAL DATA:  PICC placement EXAM: PORTABLE CHEST 1 VIEW COMPARISON:  03/18/2019 chest radiograph. FINDINGS: Left PICC terminates at the cavoatrial junction. Stable cardiomediastinal silhouette with mild cardiomegaly. No pneumothorax. Small bilateral pleural effusions, stable. Patchy hazy and linear interstitial opacities throughout both lungs, similar. IMPRESSION: 1. Left PICC terminates at the cavoatrial junction. 2. Stable small bilateral pleural effusions. 3. Mild cardiomegaly. Patchy hazy and linear interstitial opacities throughout both lungs, similar, either pulmonary edema or atypical pneumonia. Electronically Signed   By: Ilona Sorrel M.D.   On: 03/22/2019 18:17   DG Chest Port 1 View  Result Date: 03/18/2019 CLINICAL DATA:  85 year old female with sepsis. EXAM: PORTABLE CHEST 1 VIEW COMPARISON:  Chest radiograph dated 04/20/2017. FINDINGS: There is chronic interstitial coarsening and bronchitic changes. Overall slight interval progression of interstitial densities since the prior radiograph which may represent progression of background of lung disease although superimposed pneumonia is not excluded. Clinical correlation is recommended. No focal consolidation or pneumothorax. Trace bilateral pleural effusions may be present. There is mild cardiomegaly. Atherosclerotic calcification of the aorta. No acute osseous pathology. IMPRESSION: Slight interval progression of interstitial densities may represent progression of lung disease versus superimposed pneumonia. Electronically Signed    By: Anner Crete M.D.   On: 03/18/2019 23:43   ECHOCARDIOGRAM COMPLETE  Result Date: 03/20/2019    ECHOCARDIOGRAM REPORT   Patient Name:   EMORIE KNIPFER Date of Exam: 03/20/2019 Medical Rec #:  [id number]        Height:       66.0 in Accession #:    [id number]       Weight:       162.0 lb Date of Birth:  04-10-1934        BSA:          1.828 m Patient Age:    48 years         BP:           57/93 mmHg Patient Gender: F                HR:           114 bpm. Exam Location:  [facility] Procedure: 2D Echo, Cardiac Doppler and Color Doppler Indications:     Dyspnea 786.09  History:         Patient has no prior history of Echocardiogram examinations.                  CAD, COPD; Risk Factors:Hypertension. CVA.  Sonographer:     Sherrie Sport RDCS (AE) Referring Phys:  [id number] [facility] Buzz Axel Diagnosing Phys: Bartholome Bill MD IMPRESSIONS  1. Left ventricular ejection fraction, by estimation, is 70 to 75%. Left ventricular ejection fraction by  PLAX is 75 %. The left ventricle has hyperdynamic function. The left ventricle has no regional wall motion abnormalities. Left ventricular diastolic parameters are consistent with Grade I diastolic dysfunction (impaired relaxation).  2. Right ventricular systolic function is normal. The right ventricular size is mildly enlarged. There is moderately elevated pulmonary artery systolic pressure.  3. Left atrial size was mildly dilated.  4. Right atrial size was mildly dilated.  5. The mitral valve is grossly normal. Mild mitral valve regurgitation.  6. The aortic valve is grossly normal. Aortic valve regurgitation is trivial. FINDINGS  Left Ventricle: Left ventricular ejection fraction, by estimation, is 70 to 75%. Left ventricular ejection fraction by PLAX is 75 % The left ventricle has hyperdynamic function. The left ventricle has no regional wall motion abnormalities. The left ventricular internal cavity size was normal in size. There is borderline left ventricular hypertrophy. Left  ventricular diastolic parameters are consistent with Grade I diastolic dysfunction (impaired relaxation). Right Ventricle: The right ventricular size is mildly enlarged. No increase in right ventricular wall thickness. Right ventricular systolic function is normal. There is moderately elevated pulmonary artery systolic pressure. The tricuspid regurgitant velocity is 3.27 m/s, and with an assumed right atrial pressure of 10 mmHg, the estimated right ventricular systolic pressure is 123XX123 mmHg. Left Atrium: Left atrial size was mildly dilated. Right Atrium: Right atrial size was mildly dilated. Pericardium: There is no evidence of pericardial effusion. Mitral Valve: The mitral valve is grossly normal. Mild mitral valve regurgitation. Tricuspid Valve: The tricuspid valve is grossly normal. Tricuspid valve regurgitation is mild. Aortic Valve: The aortic valve is grossly normal. Aortic valve regurgitation is trivial. Aortic valve mean gradient measures 3.5 mmHg. Aortic valve peak gradient measures 7.2 mmHg. Aortic valve area, by VTI measures 2.48 cm. Pulmonic Valve: The pulmonic valve was grossly normal. Pulmonic valve regurgitation is mild. Aorta: The aortic root was not well visualized. IAS/Shunts: The atrial septum is grossly normal.  LEFT VENTRICLE PLAX 2D LV EF:         Left ventricular ejection fraction by PLAX is 75 % LVIDd:         3.69 cm LVIDs:         2.09 cm LV PW:         1.35 cm LV IVS:        0.78 cm LVOT diam:     2.00 cm LV SV:         58 LV SV Index:   32 LVOT Area:     3.14 cm  RIGHT VENTRICLE RV Basal diam:  4.34 cm LEFT ATRIUM            Index       RIGHT ATRIUM           Index LA diam:      4.70 cm  2.57 cm/m  RA Area:     24.40 cm LA Vol (A2C): 158.0 ml 86.41 ml/m RA Volume:   87.20 ml  47.69 ml/m LA Vol (A4C): 52.8 ml  28.88 ml/m  AORTIC VALVE                   PULMONIC VALVE AV Area (Vmax):    2.20 cm    PV Vmax:        0.71 m/s AV Area (Vmean):   2.43 cm    PV Peak grad:   2.0 mmHg AV  Area (VTI):     2.48 cm    RVOT Peak grad: 2  mmHg AV Vmax:           134.00 cm/s AV Vmean:          87.550 cm/s AV VTI:            0.234 m AV Peak Grad:      7.2 mmHg AV Mean Grad:      3.5 mmHg LVOT Vmax:         93.80 cm/s LVOT Vmean:        67.600 cm/s LVOT VTI:          0.185 m LVOT/AV VTI ratio: 0.79  AORTA Ao Root diam: 3.10 cm MITRAL VALVE                TRICUSPID VALVE MV Area (PHT): 4.89 cm     TR Peak grad:   42.8 mmHg MV Decel Time: 155 msec     TR Vmax:        327.00 cm/s MV E velocity: 127.00 cm/s MV A velocity: 128.00 cm/s  SHUNTS MV E/A ratio:  0.99         Systemic VTI:  0.18 m                             Systemic Diam: 2.00 cm Bartholome Bill MD Electronically signed by Bartholome Bill MD Signature Date/Time: 03/20/2019/4:00:44 PM    Final    ECHO TEE  Result Date: 03/21/2019    TRANSESOPHOGEAL ECHO REPORT   Patient Name:   YAMIRA VULLO Date of Exam: 03/21/2019 Medical Rec #:  [id number]        Height:       66.0 in Accession #:    [id number]       Weight:       238.5 lb Date of Birth:  09/20/1934        BSA:          2.155 m Patient Age:    89 years         BP:           107/40 mmHg Patient Gender: F                HR:           111 bpm. 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 COPD; Risk Factors:Hypertension and Diabetes.                  CVA.  Sonographer:     Sherrie Sport RDCS (AE) Referring Phys:  [place] Diagnosing Phys: Bartholome Bill MD PROCEDURE: TEE procedure time was 16 minutes. The transesophogeal probe was passed without difficulty through the esophogus of the patient. Imaged were obtained with the patient in a left lateral decubitus position. Local oropharyngeal anesthetic was provided with viscous lidocaine and Benzocaine spray. Sedation performed by  performing physician. Image quality was excellent. The patient's vital signs; including heart rate, blood pressure, and oxygen saturation; remained stable throughout the procedure. The patient developed no complications during the procedure. IMPRESSIONS  1. Left ventricular ejection fraction, by estimation, is 55 to 60%. The left ventricle has normal function. The left  ventricle has no regional wall motion abnormalities.  2. Right ventricular systolic function is normal. The right ventricular size is mildly enlarged.  3. No left atrial/left atrial appendage thrombus was detected.  4. Right atrial size was moderately dilated.  5. The mitral valve is grossly normal. Mild mitral valve regurgitation.  6. Tricuspid valve regurgitation is mild to moderate.  7. The aortic valve is tricuspid. Aortic valve regurgitation is trivial. No aortic stenosis is present. Conclusion(s)/Recommendation(s): No evidence of vegetation/infective endocarditis on this transesophageal echocardiogram. FINDINGS  Left Ventricle: Left ventricular ejection fraction, by estimation, is 55 to 60%. The left ventricle has normal function. The left ventricle has no regional wall motion abnormalities. The left ventricular internal cavity size was normal in size. There is  no left ventricular hypertrophy. Right Ventricle: The right ventricular size is mildly enlarged. No increase in right ventricular wall thickness. Right ventricular systolic function is normal. Left Atrium: Left atrial size was normal in size. No left atrial/left atrial appendage thrombus was detected. Right Atrium: Right atrial size was moderately dilated. Pericardium: There is no evidence of pericardial effusion. Mitral Valve: The mitral valve is grossly normal. Mild mitral valve regurgitation. There is no evidence of mitral valve vegetation. Tricuspid Valve: The tricuspid valve is grossly normal. Tricuspid valve regurgitation is mild to moderate. Aortic Valve: The aortic valve is  tricuspid. Aortic valve regurgitation is trivial. No aortic stenosis is present. There is no evidence of aortic valve vegetation. Pulmonic Valve: The pulmonic valve was not well visualized. Pulmonic valve regurgitation is trivial. Aorta: The aortic root is normal in size and structure. IAS/Shunts: The atrial septum is grossly normal. Bartholome Bill MD Electronically signed by Bartholome Bill MD Signature Date/Time: 03/21/2019/10:50:39 AM    Final    [place] EKG SITE RITE  Result Date: 03/22/2019 If Site Rite image not attached, placement could not be confirmed due to current cardiac rhythm.  US THORACENTESIS ASP PLEURAL SPACE W/IMG GUIDE  Result Date: 04/12/2019 INDICATION: Symptomatic bilateral pleural effusions. Please perform ultrasound-guided thoracentesis of dominant pleural effusion for diagnostic and therapeutic purposes. EXAM: US THORACENTESIS ASP PLEURAL SPACE W/IMG GUIDE COMPARISON:  Chest radiograph-earlier same day; 04/11/2019; 04/10/2019; chest CT-03/26/2019 MEDICATIONS: None. COMPLICATIONS: None immediate. TECHNIQUE: Informed written consent was obtained from the patient after a discussion of the risks, benefits and alternatives to treatment. A timeout was performed prior to the initiation of the procedure. Initial ultrasound scanning demonstrates a moderate to large sized bilateral pleural effusions, right slightly greater than left. As such, decision was made to proceed with right-sided thoracentesis. The right posteroinferior lateral chest was prepped and draped in the usual sterile fashion. 1% lidocaine was used for local anesthesia. An ultrasound image was saved for documentation purposes. An 8 Fr Safe-T-Centesis catheter was introduced. The thoracentesis was performed. The catheter was removed and a dressing was applied. The patient tolerated the procedure well without immediate post procedural complication. The patient was escorted to have an upright chest radiograph. FINDINGS: A total of  approximately 900 cc of blood tinged serous pleural fluid was removed. Requested samples were sent to the laboratory. IMPRESSION: Successful ultrasound-guided right sided thoracentesis yielding 900 cc of blood tinged serous pleural fluid. Postprocedural x-ray was negative for pneumothorax. Electronically Signed   By: Sandi Mariscal M.D.   On: 04/12/2019 16:05     Subjective: Patient was feeling better when seen today.  She was able to speak in full sentences, stating that she feels back to her baseline and would like to go back home.  Discharge Exam: Vitals:   04/14/19 0808 04/14/19 1311  BP: (!) 111/50   Pulse: 99   Resp: 18   Temp: 97.9 F (36.6 C)   SpO2: 95% 95%   Vitals:   04/14/19 0508 04/14/19 0740 04/14/19 0808 04/14/19 1311  BP:   (!) 111/50   Pulse:   99   Resp:   18   Temp:   97.9 F (36.6 C)   TempSrc:   Oral   SpO2:  93% 95% 95%  Weight: 92.6 kg     Height:        General: Pt is alert, awake, not in acute distress Cardiovascular: RRR, S1/S2 +, no rubs, no gallops Respiratory: Decreased breath sounds at left base. Abdominal: Soft, NT, ND, bowel sounds + Extremities: 1+ LE edema with signs of chronic venous dermatitis and lymphedema.  The results of significant diagnostics from this hospitalization (including imaging, microbiology, ancillary and laboratory) are listed below for reference.    Microbiology: Recent Results (from the past 240 hour(s))  SARS CORONAVIRUS 2 (TAT 6-24 HRS) Nasopharyngeal Nasopharyngeal Swab     Status: None   Collection Time: 04/06/19 11:32 PM   Specimen: Nasopharyngeal Swab  Result Value Ref Range Status   SARS Coronavirus 2 NEGATIVE NEGATIVE Final    Comment: (NOTE) SARS-CoV-2 target nucleic acids are NOT DETECTED. The SARS-CoV-2 RNA is generally detectable in upper and lower respiratory specimens during the acute phase of infection. Negative results do not preclude SARS-CoV-2 infection, do not rule out co-infections with other  pathogens, and should not be used as the sole basis for treatment or other patient management decisions. Negative results must be combined with clinical observations, patient history, and epidemiological information. The expected result is Negative. Fact Sheet for Patients: SugarRoll.be Fact Sheet for Healthcare Providers: https://www.woods-mathews.com/ This test is not yet approved or cleared by the [place] FDA and  has been authorized for detection and/or diagnosis of SARS-CoV-2 by FDA under an Emergency Use Authorization (EUA). This EUA will remain  in effect (meaning this test can be used) for the duration of the COVID-19 declaration under Section 56 4(b)(1) of the Act, 21 U.S.C. section 360bbb-3(b)(1), unless the authorization is terminated or revoked sooner. Performed at [facility], [place] [address], [place], [place] [address]   CULTURE, BLOOD (ROUTINE X 2) w Reflex to ID Panel     Status: None   Collection Time: 04/07/19  3:54 PM   Specimen: BLOOD  Result Value Ref Range Status   Specimen Description BLOOD BLOOD RIGHT HAND  Final   Special Requests AEROBIC BOTTLE ONLY Blood Culture adequate volume  Final   Culture   Final    NO GROWTH 5 DAYS Performed at [facility], [address] [place], [place] [address]    Report Status 04/12/2019 FINAL  Final  CULTURE, BLOOD (ROUTINE X 2) w Reflex to ID Panel     Status: None   Collection Time: 04/07/19  5:21 PM   Specimen: BLOOD  Result Value Ref Range Status   Specimen Description BLOOD BLOOD RIGHT HAND  Final   Special Requests   Final    BOTTLES DRAWN AEROBIC AND ANAEROBIC Blood Culture adequate volume   Culture   Final    NO GROWTH 5 DAYS Performed at [facility], [address], [place],  [address]    Report Status 04/12/2019 FINAL  Final  Body fluid culture     Status: None (Preliminary result)   Collection Time:  04/12/19  3:53 PM    Specimen: PATH Cytology Pleural fluid  Result Value Ref Range Status   Specimen Description   Final    PLEURAL Performed at [facility], [address], [place], [place] [address]    Special Requests   Final    PLEURAL Performed at [facility], [address] [place], [place] [address]    Gram Stain   Final    ABUNDANT WBC PRESENT,BOTH PMN AND MONONUCLEAR NO ORGANISMS SEEN    Culture   Final    NO GROWTH 2 DAYS Performed at [facility], [place] [address] [place], [place] [address]    Report Status PENDING  Incomplete     Labs: BNP (last 3 results) Recent Labs    03/28/19 0957 04/06/19 1533 04/12/19 0815  BNP 332.0* 251.0* [id number]*   Basic Metabolic Panel: Recent Labs  Lab 04/10/19 0452 04/11/19 1049 04/12/19 0815 04/13/19 1039 04/14/19 0831  NA 136 138 136 137 135  K 4.3 4.0 3.3* 3.8 3.3*  CL 105 104 101 102 100  CO2 27 25 27 26 26   GLUCOSE 102* 112* 100* 121* 92  BUN 42* 39* 38* 41* 44*  CREATININE 2.47* 2.31* 2.29* 2.16* 2.30*  CALCIUM 8.0* 8.2* 7.6* 7.6* 7.2*  PHOS  --  3.2 3.5 3.3 2.9   Liver Function Tests: Recent Labs  Lab 04/11/19 1049 04/12/19 0815 04/13/19 1039 04/14/19 0831  ALBUMIN 2.9* 2.5* 2.4* 2.4*   No results for input(s): LIPASE, AMYLASE in the last 168 hours. No results for input(s): AMMONIA in the last 168 hours. CBC: Recent Labs  Lab 04/10/19 0452 04/11/19 1049 04/12/19 0815 04/13/19 1039 04/14/19 0829  WBC 7.9 8.5 8.1 6.1 5.8  HGB 9.1* 8.7* 8.7* 8.0* 7.5*  HCT 28.2* 27.4* 26.7* 24.7* 23.1*  MCV 103.7* 105.4* 102.3* 102.9* 104.1*  PLT 119* 108* 98* 102* 123*   Cardiac Enzymes: Recent Labs  Lab 04/10/19 0452  CKTOTAL 30*   BNP: Invalid input(s): POCBNP CBG: Recent Labs  Lab 04/09/19 1152  GLUCAP 103*   D-Dimer No results for input(s): DDIMER in the last 72 hours. Hgb A1c No results for input(s): HGBA1C in the last 72 hours. Lipid Profile No results for input(s): CHOL, HDL, LDLCALC,  TRIG, CHOLHDL, LDLDIRECT in the last 72 hours. Thyroid function studies No results for input(s): TSH, T4TOTAL, T3FREE, THYROIDAB in the last 72 hours.  Invalid input(s): FREET3 Anemia work up No results for input(s): VITAMINB12, FOLATE, FERRITIN, TIBC, IRON, RETICCTPCT in the last 72 hours. Urinalysis    Component Value Date/Time   COLORURINE AMBER (A) 04/06/2019 2200   APPEARANCEUR CLOUDY (A) 04/06/2019 2200   APPEARANCEUR Clear 05/09/2012 1630   LABSPEC 1.014 04/06/2019 2200   LABSPEC 1.015 05/09/2012 1630   PHURINE 5.0 04/06/2019 2200   GLUCOSEU NEGATIVE 04/06/2019 2200   GLUCOSEU Negative 05/09/2012 1630   HGBUR SMALL (A) 04/06/2019 2200   BILIRUBINUR NEGATIVE 04/06/2019 2200   BILIRUBINUR Negative 05/09/2012 1630   KETONESUR NEGATIVE 04/06/2019 2200   PROTEINUR 30 (A) 04/06/2019 2200   NITRITE NEGATIVE 04/06/2019 2200   LEUKOCYTESUR TRACE (A) 04/06/2019 2200   LEUKOCYTESUR Negative 05/09/2012 1630   Sepsis Labs Invalid input(s): PROCALCITONIN,  WBC,  LACTICIDVEN Microbiology Recent Results (from the past 240 hour(s))  SARS CORONAVIRUS 2 (TAT 6-24 HRS) Nasopharyngeal Nasopharyngeal Swab     Status: None   Collection Time: 04/06/19 11:32 PM   Specimen: Nasopharyngeal Swab  Result Value Ref Range Status   SARS Coronavirus  2 NEGATIVE NEGATIVE Final    Comment: (NOTE) SARS-CoV-2 target nucleic acids are NOT DETECTED. The SARS-CoV-2 RNA is generally detectable in upper and lower respiratory specimens during the acute phase of infection. Negative results do not preclude SARS-CoV-2 infection, do not rule out co-infections with other pathogens, and should not be used as the sole basis for treatment or other patient management decisions. Negative results must be combined with clinical observations, patient history, and epidemiological information. The expected result is Negative. Fact Sheet for Patients: SugarRoll.be Fact Sheet for Healthcare  Providers: https://www.woods-mathews.com/ This test is not yet approved or cleared by the [place] FDA and  has been authorized for detection and/or diagnosis of SARS-CoV-2 by FDA under an Emergency Use Authorization (EUA). This EUA will remain  in effect (meaning this test can be used) for the duration of the COVID-19 declaration under Section 56 4(b)(1) of the Act, 21 U.S.C. section 360bbb-3(b)(1), unless the authorization is terminated or revoked sooner. Performed at [facility], [place] [address] [place], [place] [address]   CULTURE, BLOOD (ROUTINE X 2) w Reflex to ID Panel     Status: None   Collection Time: 04/07/19  3:54 PM   Specimen: BLOOD  Result Value Ref Range Status   Specimen Description BLOOD BLOOD RIGHT HAND  Final   Special Requests AEROBIC BOTTLE ONLY Blood Culture adequate volume  Final   Culture   Final    NO GROWTH 5 DAYS Performed at [facility], [address] [place], [place] [address]    Report Status 04/12/2019 FINAL  Final  CULTURE, BLOOD (ROUTINE X 2) w Reflex to ID Panel     Status: None   Collection Time: 04/07/19  5:21 PM   Specimen: BLOOD  Result Value Ref Range Status   Specimen Description BLOOD BLOOD RIGHT HAND  Final   Special Requests   Final    BOTTLES DRAWN AEROBIC AND ANAEROBIC Blood Culture adequate volume   Culture   Final    NO GROWTH 5 DAYS Performed at [facility], [address], [place], [place] [address]    Report Status 04/12/2019 FINAL  Final  Body fluid culture     Status: None (Preliminary result)   Collection Time: 04/12/19  3:53 PM   Specimen: PATH Cytology Pleural fluid  Result Value Ref Range Status   Specimen Description   Final    PLEURAL Performed at [facility], [address] [place], [place] [address]    Special Requests   Final    PLEURAL Performed at [facility], [address] [place], [place] [address]    Gram Stain   Final    ABUNDANT  WBC PRESENT,BOTH PMN AND MONONUCLEAR NO ORGANISMS SEEN    Culture   Final    NO GROWTH 2 DAYS Performed at [facility], [place] [address], [place], [place] [address]    Report Status PENDING  Incomplete    Time coordinating discharge: Over 30 minutes  SIGNED:  Lorella Nimrod, MD  [facility] 04/14/2019, 2:52 PM  If 7PM-7AM, please contact night-coverage [email or web address]  This record has been created using Systems analyst. Errors have been sought and corrected,but may not always be located. Such creation errors do not reflect on the standard of care.

## 2019-04-15 ENCOUNTER — Encounter: Payer: Self-pay | Admitting: Internal Medicine

## 2019-04-15 ENCOUNTER — Telehealth: Payer: Self-pay | Admitting: Family Medicine

## 2019-04-15 DIAGNOSIS — R531 Weakness: Secondary | ICD-10-CM | POA: Insufficient documentation

## 2019-04-15 LAB — BODY FLUID CULTURE: Culture: NO GROWTH

## 2019-04-15 LAB — FUNGITELL, SERUM: Fungitell Result: <31 pg/mL (ref <80)

## 2019-04-15 NOTE — Assessment & Plan Note (Signed)
Weakness.  Needs help getting up to bedside commode, etc.  Will have PT evaluate and treat.

## 2019-04-15 NOTE — Assessment & Plan Note (Signed)
Recently admitted with sepsis as outlined.  MRSA bacteremia.  On IV vancomycin as outlined.  Hold given increased creatinine.

## 2019-04-15 NOTE — Assessment & Plan Note (Signed)
Followed by neurology.  On keppra and dilantin.

## 2019-04-15 NOTE — Assessment & Plan Note (Signed)
Low carb diet and exercise.  Follow met b and a1c.  

## 2019-04-15 NOTE — Assessment & Plan Note (Signed)
No increased cough or congestion.  On continuous oxygen.  Follow.

## 2019-04-15 NOTE — Assessment & Plan Note (Signed)
Blood pressure as outlined.  Taking toprol and lasix.  Hold lasix.  Follow metabolic panel.

## 2019-04-15 NOTE — Assessment & Plan Note (Signed)
Has been evaluated by rheumatology.

## 2019-04-15 NOTE — Telephone Encounter (Signed)
Received call after hours for verbal order for hospice service evaluation. VO given.

## 2019-04-15 NOTE — Assessment & Plan Note (Signed)
Receiving IV vancomycin.  Discussed with ID.  Creatinine increased.  Vancomycin level elevated.  Hold vancomycin.  Recheck vanc level and metabolic panel tomorrow.  Mentation better today - per daughter.  Follow.

## 2019-04-15 NOTE — Assessment & Plan Note (Signed)
Recently admitted and treated with IV cefepime and vancomycin.  Was discharged on IV vancomycin.  Has PICC line.  vanc level increased.  Creatinine increased.  Hold vancomycin. Spoke to ID.  Hold vancomycin and recheck creatinine and vancomycin level tomorrow.

## 2019-04-15 NOTE — Assessment & Plan Note (Signed)
Recent admission as outlined.  ECHO - normal EF.  No reports of increased heart rate or palpitations.

## 2019-04-15 NOTE — Assessment & Plan Note (Signed)
On lasix.  Increased creatinine.  Hold lasix.  Follow.

## 2019-04-15 NOTE — Assessment & Plan Note (Signed)
Recheck cbc to confirm platelet cough stable/improved.

## 2019-04-15 NOTE — Assessment & Plan Note (Signed)
On continuous oxygen.  Follow.

## 2019-04-15 NOTE — Assessment & Plan Note (Signed)
On crestor.  Follow lipid panel and liver function tests.   

## 2019-04-16 LAB — ACID FAST SMEAR (AFB, MYCOBACTERIA): Acid Fast Smear: NEGATIVE

## 2019-04-17 ENCOUNTER — Telehealth: Payer: Self-pay

## 2019-04-17 ENCOUNTER — Telehealth: Payer: Self-pay | Admitting: Internal Medicine

## 2019-04-17 NOTE — Telephone Encounter (Signed)
Noted.  Let me know if I need to do anything.  ?

## 2019-04-17 NOTE — Telephone Encounter (Signed)
Community hospice Called reporting that patient has been admitted to [facility]

## 2019-04-17 NOTE — Telephone Encounter (Signed)
Rcvd VM from [facility] asking me to call and set up [place] for [facility] ID. I was told by husband that Hospice is now over seeing patient for comfort care. Advised Dr. Delaine Lame and Dr. Ola Spurr.

## 2019-04-17 NOTE — Telephone Encounter (Signed)
Noted  

## 2019-04-17 NOTE — Telephone Encounter (Signed)
FYI for you. Per chart notes, husband notified ID that hospice will be following pt for comfort care.

## 2019-04-22 DIAGNOSIS — J9611 Chronic respiratory failure with hypoxia: Secondary | ICD-10-CM | POA: Diagnosis not present

## 2019-04-22 DIAGNOSIS — J449 Chronic obstructive pulmonary disease, unspecified: Secondary | ICD-10-CM | POA: Diagnosis not present

## 2019-04-25 ENCOUNTER — Inpatient Hospital Stay: Payer: Medicare PPO | Admitting: Infectious Diseases

## 2019-04-26 ENCOUNTER — Inpatient Hospital Stay: Payer: Medicare PPO | Admitting: Internal Medicine

## 2019-05-01 ENCOUNTER — Other Ambulatory Visit: Payer: Self-pay

## 2019-05-01 ENCOUNTER — Encounter: Payer: Medicare PPO | Admitting: Internal Medicine

## 2019-05-01 DIAGNOSIS — R0902 Hypoxemia: Secondary | ICD-10-CM | POA: Diagnosis not present

## 2019-05-01 DIAGNOSIS — J449 Chronic obstructive pulmonary disease, unspecified: Secondary | ICD-10-CM | POA: Diagnosis not present

## 2019-05-01 NOTE — Progress Notes (Signed)
Patient ID: Brooke Baker, female    DOB: June 05, 1934, 85 y.o.   MRN: [id number]  HPI female never smoker followed for Asthma, allergic rhinitis, complicated by history DM, CAD, CVA/A. Fib/ anticoagulation, Seizure disorder Office Spirometry 05/07/16-moderately severe restriction of exhaled volume, moderate obstructive airways disease. FVC 1.59/55%, FEV1 1.14/52%, ratio 0.71, FEF 25-75% 0.75/47%  ------------------------------------------------------------------------------------   12/29/2018- 85 year old female never smoker followed for Asthma, ILD, allergic rhinitis, complicated by history DM, CAD, CVA/ A.Fib/ anticoagulation, Seizure disorder Ventolin HFA, Spiriva Respimat, Symbicort, nebulizer albuterol, tramadol O2 4 L/ [employer]  HRCT and PFT  scheduled LOV- never done. CAD followed at [facility] Albuterol hfa, neb albuterol, Symbicort 80,  -----f/u ILD. Patient stated whan she is up walking she get's SOB. o2 4l PRN during day and bedtime.  Used rescue inhaler this morning and most days- helps some. No recent use of nebulizer. Continues Symbicort.  Balance poor- walking less.  05/01/19- For televisit today- our call went straight to voice mail.  [place] patient now apparently Hospice for comfort       CXR 04/14/19-  Persistent small pleural effusion on each side. There is underlying fibrosis throughout the lungs. There is no evident new opacity heart size and pulmonary vascularity are normal. Central catheter tip is in the superior vena cava. No pneumothorax. There is aortic atherosclerosis. IMPRESSION: Small pleural effusions bilaterally. Underlying fibrosis. No new opacity evident. Stable cardiac silhouette. Aortic Atherosclerosis (ICD10-I70.0).   Review of Systems-see HPI   + = positive Constitutional:   No-   weight loss, night sweats, fevers, chills, + fatigue, lassitude. HEENT:   No-  headaches, difficulty swallowing, tooth/dental problems, sore throat,        No-  sneezing, itching, ear ache, nasal congestion, CV:  No-   chest pain,  +orthopnea, PND, swelling in lower extremities, anasarca,  dizziness, palpitations Resp: +shortness of breath with exertion or at rest.               productive cough,   +non-productive cough,  No- coughing up of blood.             change in color of mucus.   wheezing.   Skin: No-   rash or lesions. GI:  No-   heartburn, indigestion, abdominal pain, nausea, vomiting,  GU: MS:  No-   joint pain or swelling.  . Neuro-     nothing unusual Psych:  No- change in mood or affect. No depression or anxiety.  No memory loss.   Objective:   Physical Exam General- Alert, Oriented, Affect-appropriate, Distress- none acute,   +obese/ wheelchair Skin- rash-none, lesions- none, excoriation- none Lymphadenopathy- none Head- atraumatic            Eyes- Gross vision intact, PERRLA, conjunctivae clear secretions.             Ears- Hearing, canals-normal            Nose-  no-Septal dev, mucus, polyps, erosion, perforation             Throat- Mallampati II , mucosa clear , drainage- none, tonsils- atrophic Neck- flexible , trachea midline, no stridor , thyroid nl, carotid no bruit Chest - symmetrical excursion , unlabored           Heart/CV-+ almost regular , no murmur , no gallop  , no rub, nl s1 s2                           -  JVD- none , edema- 1+ , stasis changes- none, varices- none           Lung-     +few crackles/unlabored, wheeze- none            Chest wall-  Abd-  Br/ Gen/ Rectal- Not done, not indicated Extrem- cyanosis- none, clubbing, none, atrophy- none, strength- nl, + heavy legs with edema,           Neuro- grossly intact to observation

## 2019-05-02 ENCOUNTER — Telehealth: Payer: Self-pay

## 2019-05-02 DIAGNOSIS — J9611 Chronic respiratory failure with hypoxia: Secondary | ICD-10-CM | POA: Diagnosis not present

## 2019-05-02 DIAGNOSIS — J449 Chronic obstructive pulmonary disease, unspecified: Secondary | ICD-10-CM | POA: Diagnosis not present

## 2019-05-02 NOTE — Telephone Encounter (Signed)
Left message for patients daughter. Need to know if Dr Nicki Reaper is going to be the attending physician or if they prefer the Hospice MD to take over care.

## 2019-05-03 DIAGNOSIS — J449 Chronic obstructive pulmonary disease, unspecified: Secondary | ICD-10-CM | POA: Diagnosis not present

## 2019-05-17 ENCOUNTER — Telehealth: Payer: Medicare PPO

## 2019-05-17 ENCOUNTER — Ambulatory Visit (INDEPENDENT_AMBULATORY_CARE_PROVIDER_SITE_OTHER): Payer: Medicare PPO

## 2019-05-17 VITALS — Ht 65.0 in | Wt 204.0 lb

## 2019-05-17 DIAGNOSIS — Z0000 Encounter for general adult medical examination without abnormal findings: Secondary | ICD-10-CM | POA: Diagnosis not present

## 2019-05-17 NOTE — Progress Notes (Addendum)
Subjective:   Brooke Baker is a 85 y.o. female who presents for Medicare Annual (Subsequent) preventive examination.  Review of Systems:  No ROS.  Medicare Wellness Virtual Visit.  Visual/audio telehealth visit, UTA vital signs.   Ht/Wt provided.  See social history for additional risk factors.         Objective:     Vitals: Ht 5\' 5"  (1.651 m)   Wt 204 lb (92.5 kg)   LMP 08/17/1965   BMI 33.95 kg/m   Body mass index is 33.95 kg/m.  Advanced Directives 05/17/2019 04/09/2019 04/06/2019 03/19/2019 03/18/2019 01/26/2019 09/08/2018  Does Patient Have a Medical Advance Directive? Yes - No No No No No  Type of Advance Directive Living will - - - - - -  Does patient want to make changes to medical advance directive? No - Patient declined - - - - - -  Would patient like information on creating a medical advance directive? - No - Patient declined - No - Patient declined - No - Patient declined No - Patient declined    Tobacco Social History   Tobacco Use  Smoking Status Never Smoker  Smokeless Tobacco Never Used     Counseling given: Not Answered   Clinical Intake:                       Past Medical History:  Diagnosis Date  . Acute bronchitis   . Allergic rhinitis   . Arthritis   . CAD (coronary artery disease)    s/p stent LAD 8/03 and stent placement OM1  . Cancer ([place])    skin  . Chronic back pain   . COPD with asthma ([place])   . CVA (cerebral vascular accident) ([place])   . Diabetes mellitus ([place])   . Hypercholesterolemia   . Hypertension   . Seizure disorder [facility])    Past Surgical History:  Procedure Laterality Date  . BREAST BIOPSY Bilateral    axillas  . CORONARY STENT PLACEMENT  8/03   LAD  . CORONARY STENT PLACEMENT     OM1  2006  . FEMUR FRACTURE SURGERY Right [id number]   Dr. Marry Guan  . LUMBAR SPINE SURGERY     x3  . TEE WITHOUT CARDIOVERSION N/A 03/21/2019   Procedure: TRANSESOPHAGEAL ECHOCARDIOGRAM (TEE);  Surgeon: Teodoro Spray, MD;   Location: [facility] ORS;  Service: Cardiovascular;  Laterality: N/A;  . TOTAL ABDOMINAL HYSTERECTOMY     appendectomy - age 53  . TOTAL HIP ARTHROPLASTY    . TOTAL KNEE ARTHROPLASTY     2005   Family History  Problem Relation Age of Onset  . Allergies Brother   . Cancer Brother   . Allergies Sister   . Cancer Sister        Non-hodgkins lymphoma  . Asthma Sister   . Rheum arthritis Sister   . Stroke Mother   . Asthma Brother   . Rheum arthritis Brother   . Cancer Sister        non-hodgkins lymphoma  . Diabetes Brother    Social History   Socioeconomic History  . Marital status: Married    Spouse name: Not on file  . Number of children: 3  . Years of education: hs  . Highest education level: Not on file  Occupational History  . Occupation: retired Psychologist, sport and exercise: RETIRED  Tobacco Use  . Smoking status: Never Smoker  . Smokeless tobacco: Never Used  Substance and  Sexual Activity  . Alcohol use: No    Alcohol/week: 0.0 standard drinks  . Drug use: No  . Sexual activity: Never  Other Topics Concern  . Not on file  Social History Narrative   lives with husband- Edsel   Social Determinants of Health   Financial Resource Strain:   . Difficulty of Paying Living Expenses:   Food Insecurity:   . Worried About Charity fundraiser in the Last Year:   . Arboriculturist in the Last Year:   Transportation Needs:   . Film/video editor (Medical):   [name] Lack of Transportation (Non-Medical):   Physical Activity:   . Days of Exercise per Week:   . Minutes of Exercise per Session:   Stress:   . Feeling of Stress :   Social Connections:   . Frequency of Communication with Friends and Family:   . Frequency of Social Gatherings with Friends and Family:   . Attends Religious Services:   . Active Member of Clubs or Organizations:   . Attends Archivist Meetings:   [name] Marital Status:     Outpatient Encounter Medications as of 05/17/2019  Medication Sig  .  albuterol (PROVENTIL) (2.5 MG/3ML) 0.083% nebulizer solution USE ONE VIAL VIA NEBULZIER EVERY FOUR HOURS AS NEEDED FOR WHEEZING  . albuterol (VENTOLIN HFA) 108 (90 Base) MCG/ACT inhaler USE 2 PUFFS EVERY 4 HOURS AS DIRECTED - RESCUE (Patient taking differently: Inhale 2 puffs into the lungs every 4 (four) hours as needed for wheezing or shortness of breath. )  . apixaban (ELIQUIS) 2.5 MG TABS tablet Take 1 tablet (2.5 mg total) by mouth 2 (two) times daily.  . budesonide-formoterol (SYMBICORT) 80-4.5 MCG/ACT inhaler INHALE 2 PUFFS TWICE A DAY (Patient taking differently: Inhale 2 puffs into the lungs 2 (two) times daily. )  . furosemide (LASIX) 40 MG tablet Take 1 tablet (40 mg total) by mouth daily.  [name] gabapentin (NEURONTIN) 100 MG capsule TAKE ONE CAPSULE BY MOUTH TWICE A DAY (Patient taking differently: Take 200 mg by mouth at bedtime. )  . levETIRAcetam (KEPPRA) 500 MG tablet Take 1 tablet (500 mg total) by mouth 2 (two) times daily.  . metoprolol succinate (TOPROL-XL) 25 MG 24 hr tablet Take 1 tablet (25 mg total) by mouth daily.  . Omega-3 Fatty Acids (FISH OIL) 1000 MG CAPS Take 1,000 mg by mouth daily.   . phenytoin (DILANTIN) 100 MG ER capsule Take 4 capsules (400 mg total) by mouth at bedtime.  . polyethylene glycol (MIRALAX / GLYCOLAX) 17 g packet Take 17 g by mouth daily as needed for mild constipation or moderate constipation.   . potassium chloride SA (KLOR-CON) 20 MEQ tablet Take 1 tablet (20 mEq total) by mouth daily.  . rosuvastatin (CRESTOR) 20 MG tablet TAKE 1 TABLET BY MOUTH DAILY (Patient taking differently: Take 20 mg by mouth daily. )  . sertraline (ZOLOFT) 50 MG tablet Take 75 mg by mouth at bedtime.    No facility-administered encounter medications on file as of 05/17/2019.    Activities of Daily Living In your present state of health, do you have any difficulty performing the following activities: 05/17/2019 04/09/2019  Hearing? N N  Vision? N Y  Comment - glasses are at  home  Difficulty concentrating or making decisions? Y Y  Comment Notes difficulty remembering sometimes. -  Walking or climbing stairs? Y Y  Comment Bedfast -  Dressing or bathing? Y Y  Doing errands, shopping? Aggie Moats  Comment Bedfast -  Preparing Food and eating ? Y -  Using the Toilet? Y -  In the past six months, have you accidently leaked urine? N -  Comment Notes no leaking or loss of control but may not make it to [facility] on time -  Do you have problems with loss of bowel control? N -  Comment Notes no leaking or loss of control but may not make it to [facility] on time -  Managing your Medications? Y -  Managing your Finances? Y -  Housekeeping or managing your Housekeeping? Y -  Some recent data might be hidden    Patient Care Team: Einar Pheasant, MD as PCP - General    Assessment:   This is a routine wellness examination for [place].  Nurse connected withpatient 05/17/19 at  1:30 PM EDT by a telephone enabled telemedicine application, no video access and verified that I am speaking with the correct person using two identifiers. Patient stated full name and DOB. Patient gave permission to continue with virtual visit. Patient's location was at home and Nurse's location was at [place] office.   Patient is alert.  Health Maintenance Due: -Tdap vaccine- deferred  -Covid vaccines- deferred -Eye exam- deferred  -Foot Exam- deferred  See completed HM at the end of note.   Eye: Visual acuity not assessed.   Hearing: Demonstrates normal hearing during visit.  Safety:  Patient feels safe at home- yes Patient does have smoke detectors at home- yes Patient has support at home- yes  Social: Alcohol intake - no  Smoking history- never   Smokers in home? none Illicit drug use? none  Medication: Patient states no longer taking medications, as directed.    Covid-19: Precautions observed.    Activities of Daily Living Dependent; nurse/aide assist  Physical activity-  bedfast.  Diet:  Drink Boost/Ensure as tolerated. Assist with feeding.  Water: good intake Caffeine: none  Other Providers Patient Care Team: Einar Pheasant, MD as PCP - General  Exercise Activities and Dietary recommendations Current Exercise Habits: The patient does not participate in regular exercise at present           Goals              . Follow up with Primary Care Provider       As needed       Fall Risk Fall Risk  05/17/2019 09/05/2018 05/16/2018 03/25/2017 02/25/2017  Falls in the past year? 0 0 0 No No  Number falls in past yr: - - - - -  Injury with Fall? - - - - -  Risk Factor Category  - - - - -  Risk for fall due to : - Impaired vision - - -  Follow up Falls evaluation completed Falls evaluation completed - - -   Timed Get Up and Go performed: no, virtual visit  Depression Screen PHQ 2/9 Scores 05/17/2019 09/05/2018 05/20/2018 05/16/2018  PHQ - 2 Score 0 1 0 0  PHQ- 9 Score - 1 3 -     Cognitive Function MMSE - Mini Mental State Exam 05/17/2019 02/25/2016  Not completed: Unable to complete -  Orientation to time - 5  Orientation to Place - 5  Registration - 3  Attention/ Calculation - 5  Recall - 3  Language- name 2 objects - 2  Language- repeat - 1  Language- follow 3 step command - 3  Language- read & follow direction - 1  Write a sentence - 1  Copy design - 1  Total score - 30   6CIT Screen 05/16/2018 02/25/2017  What Year? 0 points 0 points  What month? 0 points 0 points  What time? 0 points 0 points  Count back from 20 0 points 0 points  Months in reverse 0 points 0 points  Repeat phrase 0 points 0 points  Total Score 0 0        Immunization History  Administered Date(s) Administered  . Influenza Split 11/06/2010, 11/02/2011, 10/17/2012, 10/12/2013  . Influenza Whole 10/28/2004, 10/05/2008  . Influenza, High Dose Seasonal PF 09/13/2015, 11/17/2016, 10/05/2017, 12/07/2018  . Influenza-Unspecified 10/04/2014  .  Pneumococcal Conjugate-13 11/19/2012  . Pneumococcal Polysaccharide-23 01/05/2005, 10/04/2014  . Zoster 12/22/2012   Screening Tests     Health Maintenance  Topic Date Due  . OPHTHALMOLOGY EXAM  Never done  . COVID-19 Vaccine (1) Never done  . TETANUS/TDAP  Never done  . FOOT EXAM  10/30/2017  . MAMMOGRAM  05/12/2018  . URINE MICROALBUMIN  06/10/2019  . HEMOGLOBIN A1C  07/19/2019  . INFLUENZA VACCINE  08/06/2019  . DEXA SCAN  Completed  . PNA vac Low Risk Adult  Completed      Plan:   Plan  Keep all routine maintenance appointments.   Follow up with pcp as needed.  Medicare Attestation I have personally reviewed: The patient's medical and social history Their use of alcohol, tobacco or illicit drugs Their current medications and supplements The patient's functional ability including ADLs,fall risks, home safety risks, cognitive, and hearing and visual impairment Diet and physical activities Evidence for depression   I have reviewed and discussed with patient certain preventive protocols, quality metrics, and best practice recommendations.      Varney Biles, LPN                       05/17/2019  Reviewed above information.  Per Lerline Valdivia - pt in good spirits.  Hospice following.  Agree with plan.    Dr Nicki Reaper

## 2019-05-17 NOTE — Patient Instructions (Addendum)
  Ms. Brooke Baker , Thank you for taking time to come for your Medicare Wellness Visit. I appreciate your ongoing commitment to your health goals. Please review the following plan we discussed and let me know if I can assist you in the future.   These are the goals we discussed: Goals    . Follow up with Primary Care Provider     As needed       This is a list of the screening recommended for you and due dates:  Health Maintenance  Topic Date Due  . Eye exam for diabetics  Never done  . COVID-19 Vaccine (1) Never done  . Tetanus Vaccine  Never done  . Complete foot exam   10/30/2017  . Mammogram  05/12/2018  . Urine Protein Check  06/10/2019  . Hemoglobin A1C  07/19/2019  . Flu Shot  08/06/2019  . DEXA scan (bone density measurement)  Completed  . Pneumonia vaccines  Completed

## 2019-05-17 NOTE — Patient Instructions (Addendum)
  Brooke Baker , Thank you for taking time to come for your Medicare Wellness Visit. I appreciate your ongoing commitment to your health goals. Please review the following plan we discussed and let me know if I can assist you in the future.   These are the goals we discussed: Goals    . Follow up with Primary Care Provider     As needed       This is a list of the screening recommended for you and due dates:  Health Maintenance  Topic Date Due  . Eye exam for diabetics  Never done  . COVID-19 Vaccine (1) Never done  . Tetanus Vaccine  Never done  . Complete foot exam   10/30/2017  . Mammogram  05/12/2018  . Urine Protein Check  06/10/2019  . Hemoglobin A1C  07/19/2019  . Flu Shot  08/06/2019  . DEXA scan (bone density measurement)  Completed  . Pneumonia vaccines  Completed

## 2019-05-17 NOTE — Progress Notes (Signed)
Subjective:   Brooke Baker is a 85 y.o. female who presents for Medicare Annual (Subsequent) preventive examination.  Review of Systems:  No ROS.  Medicare Wellness Virtual Visit.  Visual/audio telehealth visit. Ht/Wt provided. See social history for additional risk factors.  Cardiac Risk Factors include: advanced age (>82men, >67 women);diabetes mellitus;hypertension     Objective:     Vitals: Ht 5\' 5"  (1.651 m)   Wt 204 lb (92.5 kg)   LMP 08/17/1965   BMI 33.95 kg/m   Body mass index is 33.95 kg/m.  Advanced Directives 05/17/2019 04/09/2019 04/06/2019 03/19/2019 03/18/2019 01/26/2019 09/08/2018  Does Patient Have a Medical Advance Directive? Yes - No No No No No  Type of Advance Directive Living will - - - - - -  Does patient want to make changes to medical advance directive? No - Patient declined - - - - - -  Would patient like information on creating a medical advance directive? - No - Patient declined - No - Patient declined - No - Patient declined No - Patient declined    Tobacco Social History   Tobacco Use  Smoking Status Never Smoker  Smokeless Tobacco Never Used     Counseling given: Not Answered   Clinical Intake:  Pre-visit preparation completed: Yes           How often do you need to have someone help you when you read instructions, pamphlets, or other written materials from your doctor or pharmacy?: 5 - Always  Interpreter Needed?: No     Past Medical History:  Diagnosis Date  . Acute bronchitis   . Allergic rhinitis   . Arthritis   . CAD (coronary artery disease)    s/p stent LAD 8/03 and stent placement OM1  . Cancer ([place])    skin  . Chronic back pain   . COPD with asthma ([place])   . CVA (cerebral vascular accident) ([place])   . Diabetes mellitus ([place])   . Hypercholesterolemia   . Hypertension   . Seizure disorder [facility])    Past Surgical History:  Procedure Laterality Date  . BREAST BIOPSY Bilateral    axillas  . CORONARY STENT  PLACEMENT  8/03   LAD  . CORONARY STENT PLACEMENT     OM1  2006  . FEMUR FRACTURE SURGERY Right [id number]   Dr. Marry Guan  . LUMBAR SPINE SURGERY     x3  . TEE WITHOUT CARDIOVERSION N/A 03/21/2019   Procedure: TRANSESOPHAGEAL ECHOCARDIOGRAM (TEE);  Surgeon: Teodoro Spray, MD;  Location: [facility] ORS;  Service: Cardiovascular;  Laterality: N/A;  . TOTAL ABDOMINAL HYSTERECTOMY     appendectomy - age 11  . TOTAL HIP ARTHROPLASTY    . TOTAL KNEE ARTHROPLASTY     2005   Family History  Problem Relation Age of Onset  . Allergies Brother   . Cancer Brother   . Allergies Sister   . Cancer Sister        Non-hodgkins lymphoma  . Asthma Sister   . Rheum arthritis Sister   . Stroke Mother   . Asthma Brother   . Rheum arthritis Brother   . Cancer Sister        non-hodgkins lymphoma  . Diabetes Brother    Social History   Socioeconomic History  . Marital status: Married    Spouse name: Not on file  . Number of children: 3  . Years of education: hs  . Highest education level: Not on file  Occupational History  .  Occupation: retired Psychologist, sport and exercise: RETIRED  Tobacco Use  . Smoking status: Never Smoker  . Smokeless tobacco: Never Used  Substance and Sexual Activity  . Alcohol use: No    Alcohol/week: 0.0 standard drinks  . Drug use: No  . Sexual activity: Never  Other Topics Concern  . Not on file  Social History Narrative   lives with husband- Edsel   Social Determinants of Health   Financial Resource Strain:   . Difficulty of Paying Living Expenses:   Food Insecurity:   . Worried About Charity fundraiser in the Last Year:   . Arboriculturist in the Last Year:   Transportation Needs:   . Film/video editor (Medical):   [name] Lack of Transportation (Non-Medical):   Physical Activity:   . Days of Exercise per Week:   . Minutes of Exercise per Session:   Stress:   . Feeling of Stress :   Social Connections:   . Frequency of Communication with Friends and Family:     . Frequency of Social Gatherings with Friends and Family:   . Attends Religious Services:   . Active Member of Clubs or Organizations:   . Attends Archivist Meetings:   [name] Marital Status:     Outpatient Encounter Medications as of 05/17/2019  Medication Sig  . albuterol (PROVENTIL) (2.5 MG/3ML) 0.083% nebulizer solution USE ONE VIAL VIA NEBULZIER EVERY FOUR HOURS AS NEEDED FOR WHEEZING  . albuterol (VENTOLIN HFA) 108 (90 Base) MCG/ACT inhaler USE 2 PUFFS EVERY 4 HOURS AS DIRECTED - RESCUE (Patient taking differently: Inhale 2 puffs into the lungs every 4 (four) hours as needed for wheezing or shortness of breath. )  . apixaban (ELIQUIS) 2.5 MG TABS tablet Take 1 tablet (2.5 mg total) by mouth 2 (two) times daily.  . budesonide-formoterol (SYMBICORT) 80-4.5 MCG/ACT inhaler INHALE 2 PUFFS TWICE A DAY (Patient taking differently: Inhale 2 puffs into the lungs 2 (two) times daily. )  . furosemide (LASIX) 40 MG tablet Take 1 tablet (40 mg total) by mouth daily.  [name] gabapentin (NEURONTIN) 100 MG capsule TAKE ONE CAPSULE BY MOUTH TWICE A DAY (Patient taking differently: Take 200 mg by mouth at bedtime. )  . levETIRAcetam (KEPPRA) 500 MG tablet Take 1 tablet (500 mg total) by mouth 2 (two) times daily.  . metoprolol succinate (TOPROL-XL) 25 MG 24 hr tablet Take 1 tablet (25 mg total) by mouth daily.  . Omega-3 Fatty Acids (FISH OIL) 1000 MG CAPS Take 1,000 mg by mouth daily.   . phenytoin (DILANTIN) 100 MG ER capsule Take 4 capsules (400 mg total) by mouth at bedtime.  . polyethylene glycol (MIRALAX / GLYCOLAX) 17 g packet Take 17 g by mouth daily as needed for mild constipation or moderate constipation.   . potassium chloride SA (KLOR-CON) 20 MEQ tablet Take 1 tablet (20 mEq total) by mouth daily.  . rosuvastatin (CRESTOR) 20 MG tablet TAKE 1 TABLET BY MOUTH DAILY (Patient taking differently: Take 20 mg by mouth daily. )  . sertraline (ZOLOFT) 50 MG tablet Take 75 mg by mouth at bedtime.     No facility-administered encounter medications on file as of 05/17/2019.    Activities of Daily Living In your present state of health, do you have any difficulty performing the following activities: 05/17/2019 04/09/2019  Hearing? N N  Vision? N Y  Comment - glasses are at home  Difficulty concentrating or making decisions? Tempie Donning  Comment  Notes difficulty remembering sometimes. -  Walking or climbing stairs? Y Y  Comment Bedfast -  Dressing or bathing? Y Y  Doing errands, shopping? Y N  Comment Bedfast -  Preparing Food and eating ? Y -  Using the Toilet? Y -  In the past six months, have you accidently leaked urine? N -  Comment Notes no leaking or loss of control but may not make it to [facility] on time -  Do you have problems with loss of bowel control? N -  Comment Notes no leaking or loss of control but may not make it to [facility] on time -  Managing your Medications? Y -  Managing your Finances? Y -  Housekeeping or managing your Housekeeping? Y -  Some recent data might be hidden    Patient Care Team: Einar Pheasant, MD as PCP - General    Assessment:   This is a routine wellness examination for Brooke Baker.  Nurse connected with patient 05/17/19 at  1:30 PM EDT by a telephone enabled telemedicine application, no video access and verified that I am speaking with the correct person using two identifiers. Patient stated full name and DOB. Patient gave permission to continue with virtual visit. Patient's location was at home and Nurse's location was at [place] office.   Patient is alert.  Health Maintenance Due: -Tdap vaccine- deferred  -Covid vaccines- deferred -Eye exam- deferred  -Foot Exam- deferred  See completed HM at the end of note.   Eye: Visual acuity not assessed.   Hearing: Demonstrates normal hearing during visit.  Safety:  Patient feels safe at home- yes Patient does have smoke detectors at home- yes Patient has support at home- yes  Social: Alcohol intake -  no  Smoking history- never   Smokers in home? none Illicit drug use? none  Medication: Patient states no longer taking medications, as directed.    Covid-19: Precautions observed.    Activities of Daily Living Dependent; nurse/aide assist  Physical activity- bedfast.  Diet:  Drink Boost/Ensure as tolerated. Assist with feeding.  Water: good intake Caffeine: none  Other Providers Patient Care Team: Einar Pheasant, MD as PCP - General  Exercise Activities and Dietary recommendations Current Exercise Habits: The patient does not participate in regular exercise at present  Goals    . Follow up with Primary Care Provider     As needed       Fall Risk Fall Risk  05/17/2019 09/05/2018 05/16/2018 03/25/2017 02/25/2017  Falls in the past year? 0 0 0 No No  Number falls in past yr: - - - - -  Injury with Fall? - - - - -  Risk Factor Category  - - - - -  Risk for fall due to : - Impaired vision - - -  Follow up Falls evaluation completed Falls evaluation completed - - -   Timed Get Up and Go performed: no, virtual visit  Depression Screen PHQ 2/9 Scores 05/17/2019 09/05/2018 05/20/2018 05/16/2018  PHQ - 2 Score 0 1 0 0  PHQ- 9 Score - 1 3 -     Cognitive Function MMSE - Mini Mental State Exam 05/17/2019 02/25/2016  Not completed: Unable to complete -  Orientation to time - 5  Orientation to Place - 5  Registration - 3  Attention/ Calculation - 5  Recall - 3  Language- name 2 objects - 2  Language- repeat - 1  Language- follow 3 step command - 3  Language- read & follow  direction - 1  Write a sentence - 1  Copy design - 1  Total score - 30     6CIT Screen 05/16/2018 02/25/2017  What Year? 0 points 0 points  What month? 0 points 0 points  What time? 0 points 0 points  Count back from 20 0 points 0 points  Months in reverse 0 points 0 points  Repeat phrase 0 points 0 points  Total Score 0 0    Immunization History  Administered Date(s) Administered  . Influenza  Split 11/06/2010, 11/02/2011, 10/17/2012, 10/12/2013  . Influenza Whole 10/28/2004, 10/05/2008  . Influenza, High Dose Seasonal PF 09/13/2015, 11/17/2016, 10/05/2017, 12/07/2018  . Influenza-Unspecified 10/04/2014  . Pneumococcal Conjugate-13 11/19/2012  . Pneumococcal Polysaccharide-23 01/05/2005, 10/04/2014  . Zoster 12/22/2012   Screening Tests Health Maintenance  Topic Date Due  . OPHTHALMOLOGY EXAM  Never done  . COVID-19 Vaccine (1) Never done  . TETANUS/TDAP  Never done  . FOOT EXAM  10/30/2017  . MAMMOGRAM  05/12/2018  . URINE MICROALBUMIN  06/10/2019  . HEMOGLOBIN A1C  07/19/2019  . INFLUENZA VACCINE  08/06/2019  . DEXA SCAN  Completed  . PNA vac Low Risk Adult  Completed      Plan:   Keep all routine maintenance appointments.   Follow up with pcp as needed.  Medicare Attestation I have personally reviewed: The patient's medical and social history Their use of alcohol, tobacco or illicit drugs Their current medications and supplements The patient's functional ability including ADLs,fall risks, home safety risks, cognitive, and hearing and visual impairment Diet and physical activities Evidence for depression   I have reviewed and discussed with patient certain preventive protocols, quality metrics, and best practice recommendations.      Brooke Biles, LPN  [id number]   Reviewed above information.  Pt established with Hospice.  Per Brooke Baker - in good spirits.  Agree with plan.    Dr Nicki Reaper

## 2019-05-22 DIAGNOSIS — J9611 Chronic respiratory failure with hypoxia: Secondary | ICD-10-CM | POA: Diagnosis not present

## 2019-05-22 DIAGNOSIS — J449 Chronic obstructive pulmonary disease, unspecified: Secondary | ICD-10-CM | POA: Diagnosis not present

## 2019-05-29 LAB — ACID FAST CULTURE WITH REFLEXED SENSITIVITIES (MYCOBACTERIA): Acid Fast Culture: NEGATIVE

## 2019-05-31 DIAGNOSIS — J449 Chronic obstructive pulmonary disease, unspecified: Secondary | ICD-10-CM | POA: Diagnosis not present

## 2019-05-31 DIAGNOSIS — R0902 Hypoxemia: Secondary | ICD-10-CM | POA: Diagnosis not present

## 2019-06-02 DIAGNOSIS — J449 Chronic obstructive pulmonary disease, unspecified: Secondary | ICD-10-CM | POA: Diagnosis not present

## 2019-06-19 ENCOUNTER — Inpatient Hospital Stay: Admitting: Oncology

## 2019-06-19 ENCOUNTER — Telehealth: Payer: Self-pay | Admitting: Internal Medicine

## 2019-06-19 ENCOUNTER — Inpatient Hospital Stay

## 2019-06-19 ENCOUNTER — Telehealth: Payer: Self-pay | Admitting: Oncology

## 2019-06-19 NOTE — Telephone Encounter (Signed)
Pt daughter called to let Dr.[name] know that Mrs.Haldeman passed away on Jun 30, 2019

## 2019-06-19 NOTE — Telephone Encounter (Signed)
Patient missed appt this AM. Writer phoned patient on this date to reschedule and was informed that patient passed on 02-Jul-2019. Appts cancelled and team made aware.

## 2019-06-19 NOTE — Telephone Encounter (Signed)
Hospice also called to report death 07-10-19 @7 :51pm

## 2019-06-19 NOTE — Telephone Encounter (Signed)
FYI for you. 

## 2019-06-20 NOTE — Telephone Encounter (Signed)
[facility] service dropped of certificate of death to be signed. Placed in colored folder up front

## 2019-06-20 NOTE — Telephone Encounter (Signed)
Called and left message for pts husband - to let us know if he needs anything.   Unable to reach.  I was not sure if you needed this message back given had information about the death certificate.

## 2019-06-20 NOTE — Telephone Encounter (Signed)
Death certificate given to Dr Tullo since Dr [name] is out of the office. °

## 2019-06-21 NOTE — Telephone Encounter (Signed)
Death certificate has been picked up

## 2019-07-06 DEATH — deceased

## 2020-05-17 ENCOUNTER — Ambulatory Visit: Payer: Medicare PPO

## 2020-08-12 IMAGING — CR DG FEMUR 2+V*R*
6 series · 6 of 6 positions shown · non-contrast
Comparison: 05/13/2012 and 05/08/2012.

CLINICAL DATA: 84-year-old who fell at home while doing her
laundry, presenting with RIGHT hip pain and RIGHT leg pain. Initial
encounter.

EXAM:
RIGHT FEMUR 2 VIEWS

[femur ap (1 of 4)]
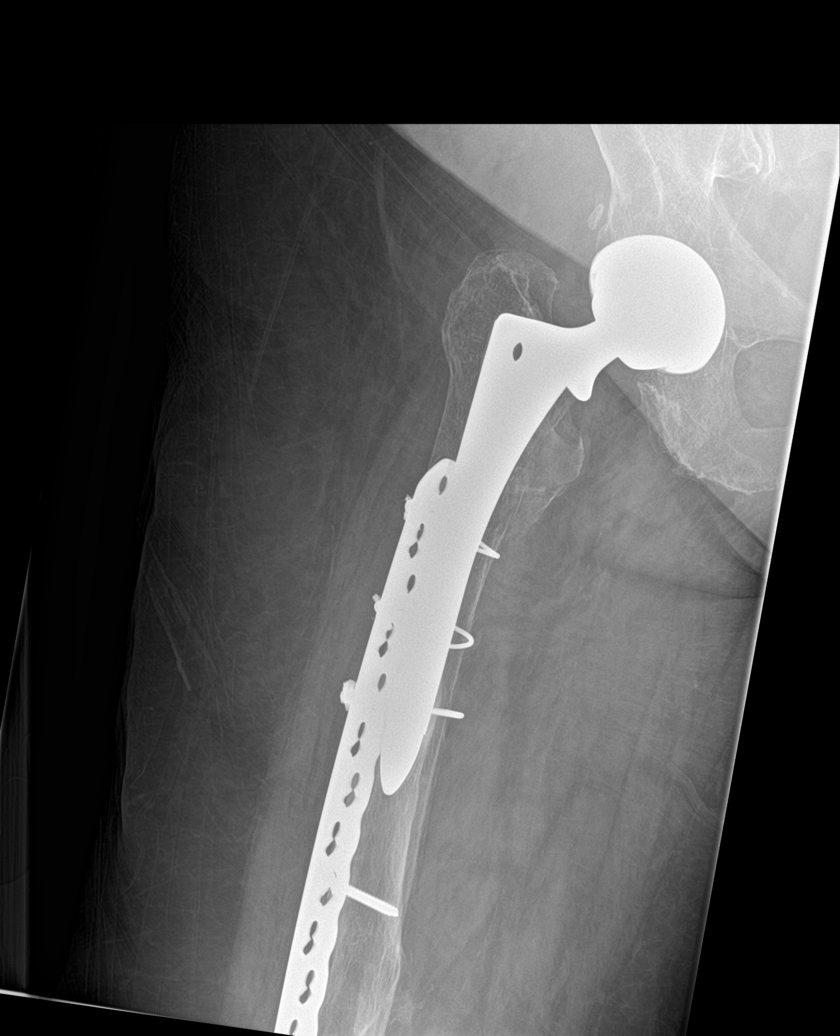

[femur ap (2 of 4)]
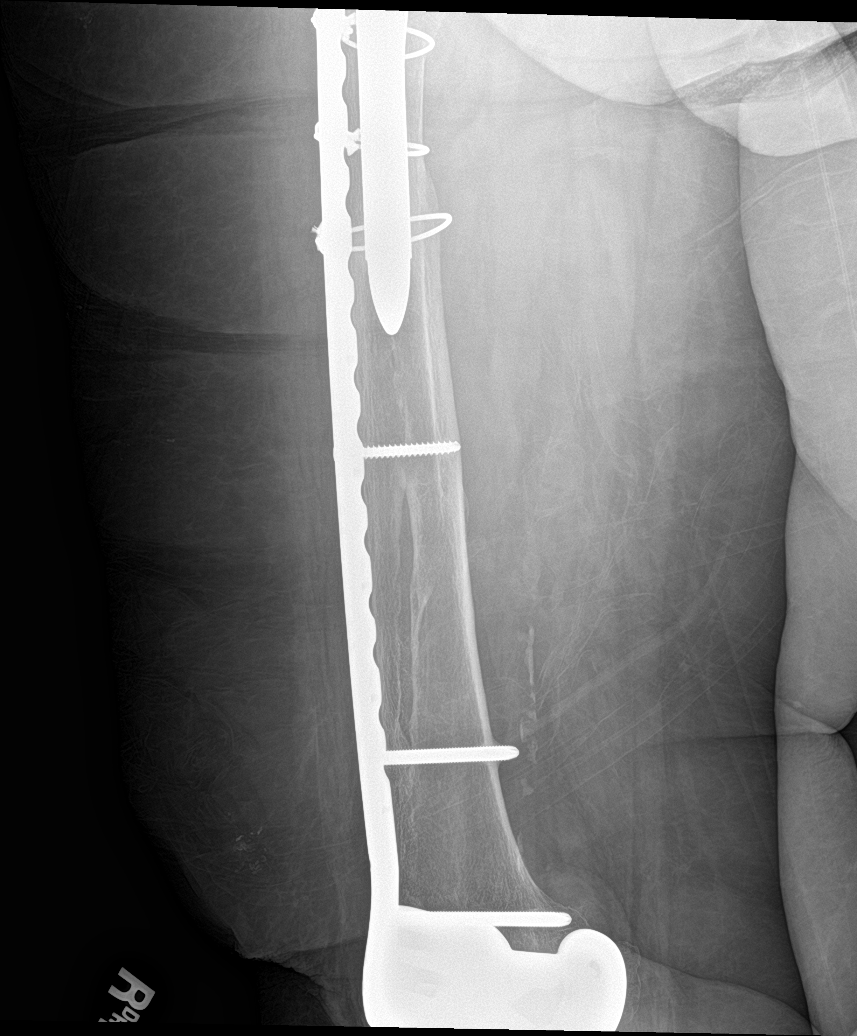

[femur lat (1 of 2)]
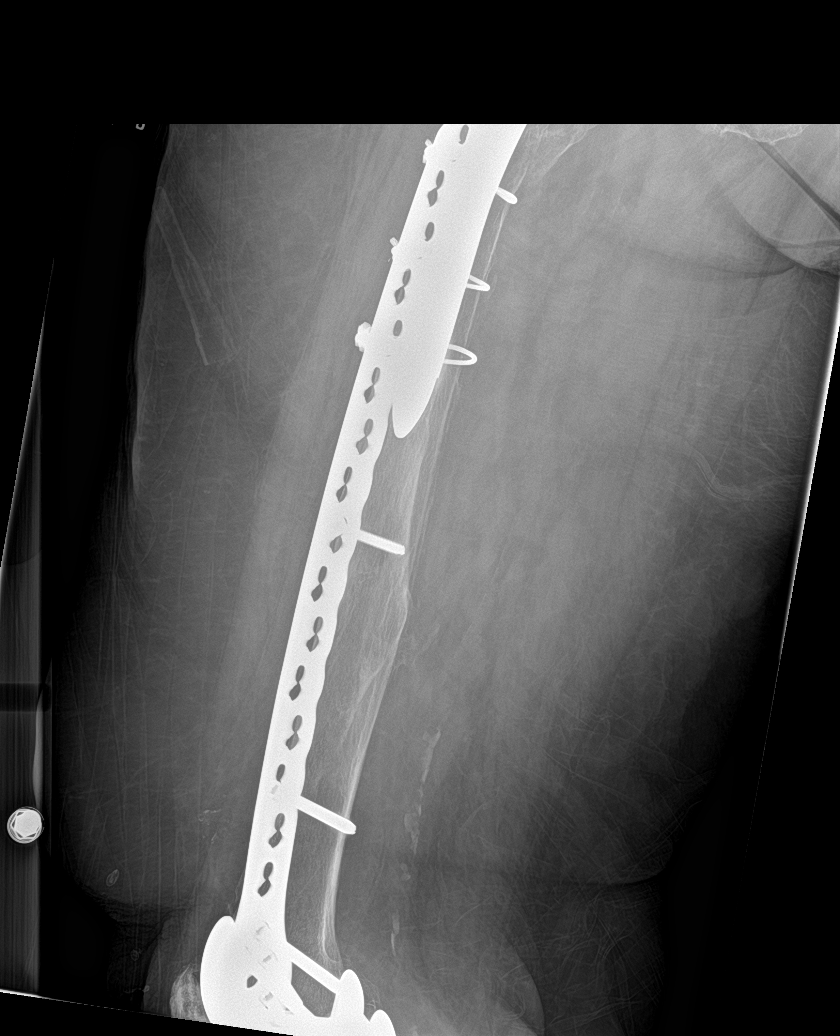

[femur lat (2 of 2)]
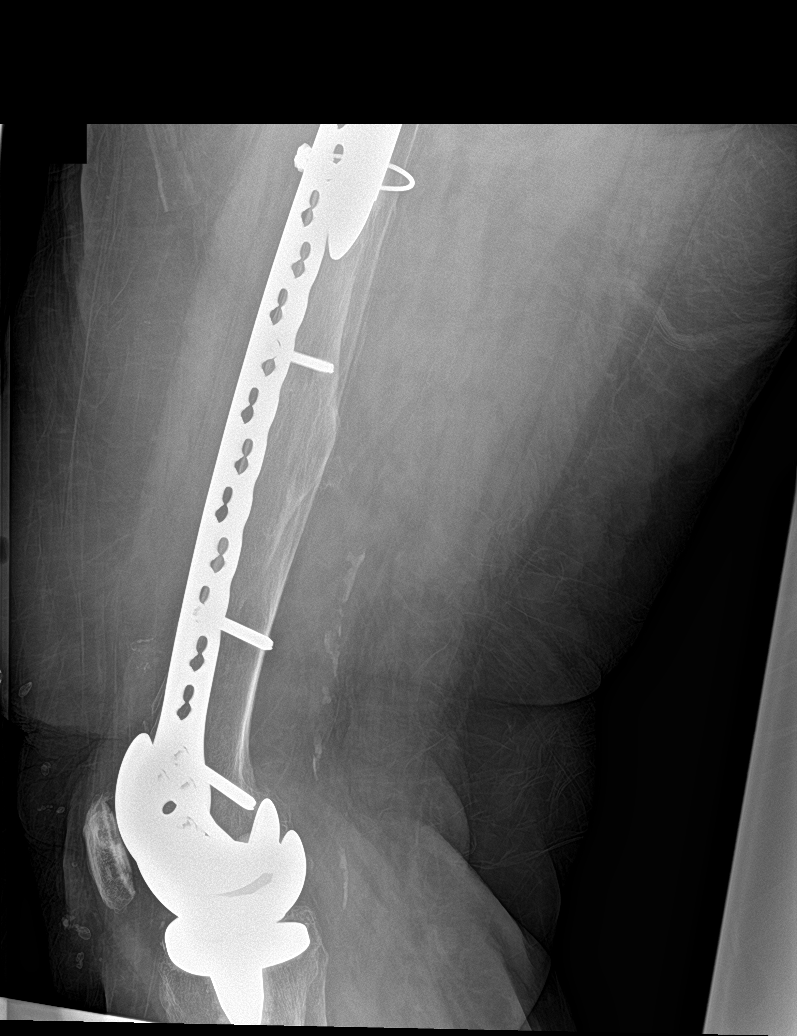

[femur ap (3 of 4)]
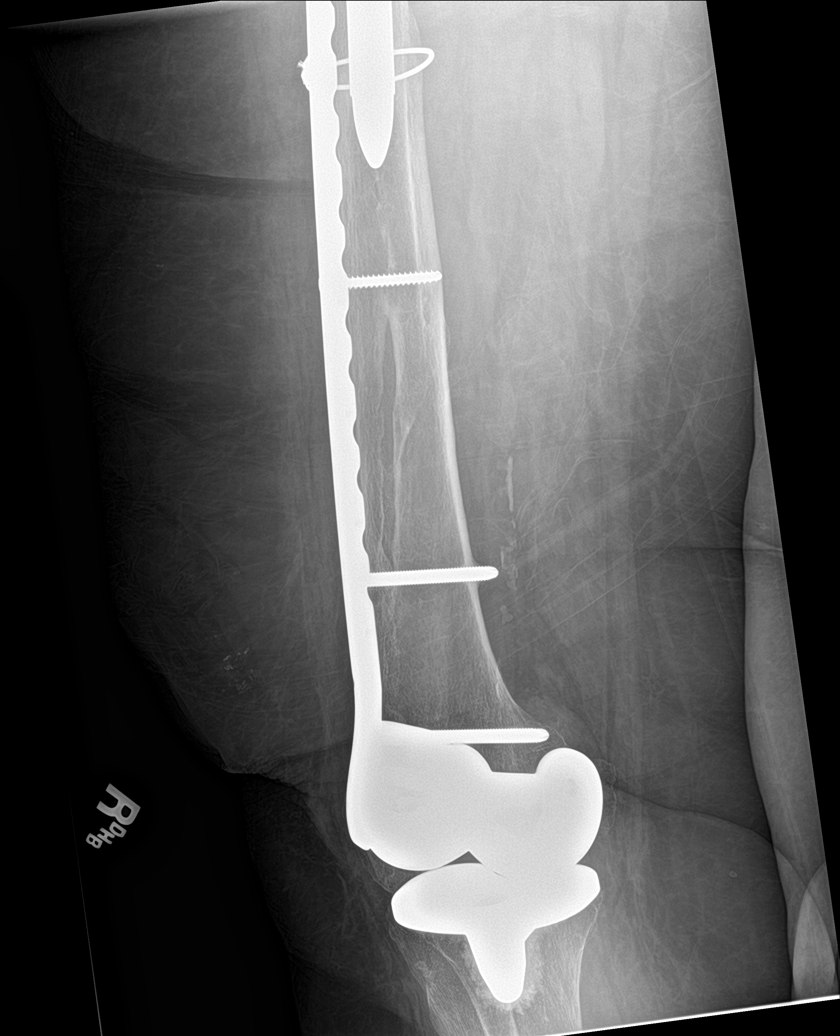

[femur ap (4 of 4)]
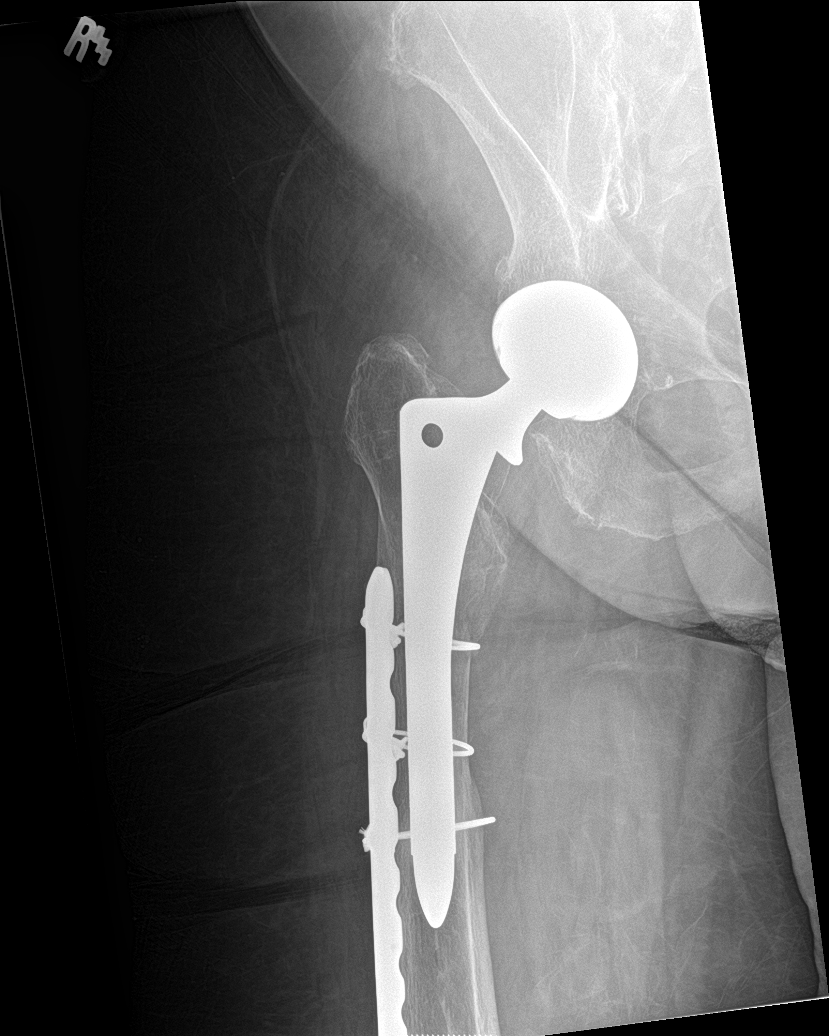

[6 of 6 positions shown; findings below may reference images not displayed]

FINDINGS: Prior RIGHT hip arthroplasty and prior ORIF of a mid and distal
femur fracture with normal healing. Prior RIGHT knee arthroplasty.
No acute fractures involving the femur. Anatomic alignment of the
hip prosthesis and knee prosthesis. Severe osseous demineralization.
IMPRESSION: 1. No acute osseous abnormality.
2. Prior RIGHT hip arthroplasty with anatomic alignment and prior
ORIF of a mid and distal femur fracture with normal healing.
3. Severe osseous demineralization.

## 2020-08-12 IMAGING — CT CT HEAD W/O CM
5 of 7 series · 16 of 47 positions shown, 17 images · non-contrast
Comparison: None.

CLINICAL DATA: Fell at home with trauma to the head and neck.

EXAM:
CT HEAD WITHOUT CONTRAST
CT CERVICAL SPINE WITHOUT CONTRAST
TECHNIQUE: Multidetector CT imaging of the head and cervical spine was
performed following the standard protocol without intravenous
contrast. Multiplanar CT image reconstructions of the cervical spine
were also generated.

[Series 2: head wo · axial · 0.43mm/px · z∈[-77,-27]mm · 2 of 31 slices shown, 3 images]
[im 11/31  brain]
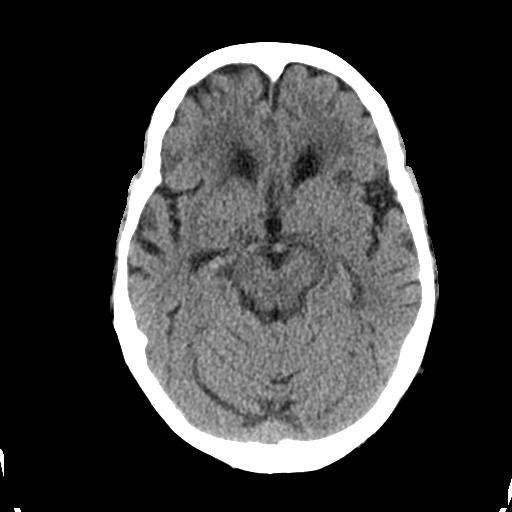
[im 11/31  bone]
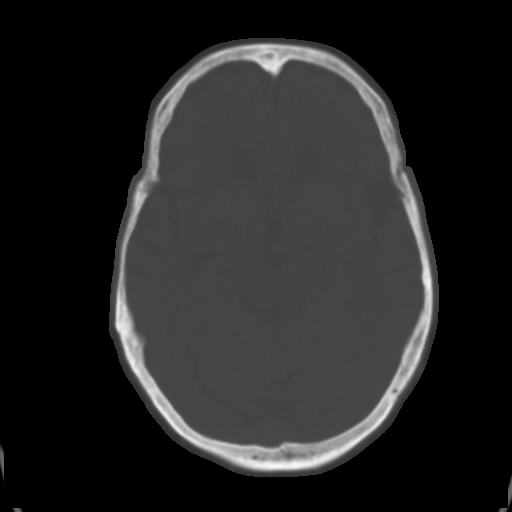
[im 21/31  brain]
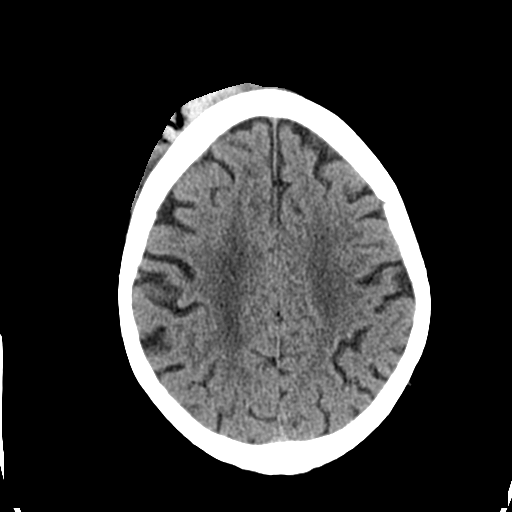

[Series 4: coronal soft tissue · coronal · 0.31mm/px · 3 of 67 slices shown]
[im 19/67  brain]
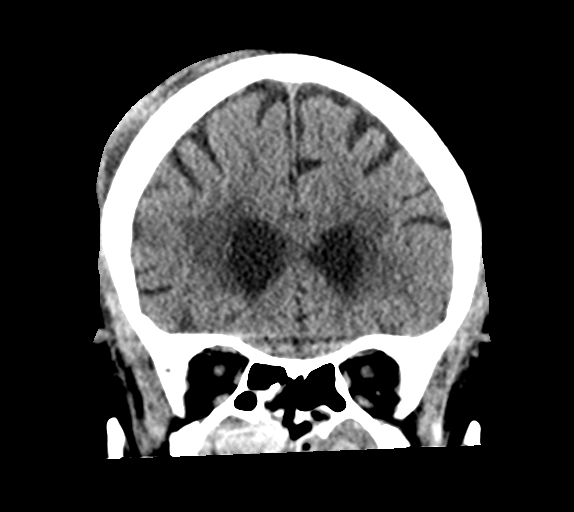
[im 29/67  brain]
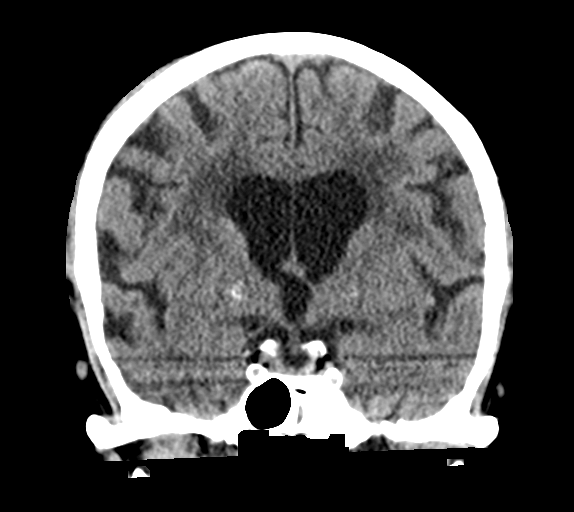
[im 38/67  brain]
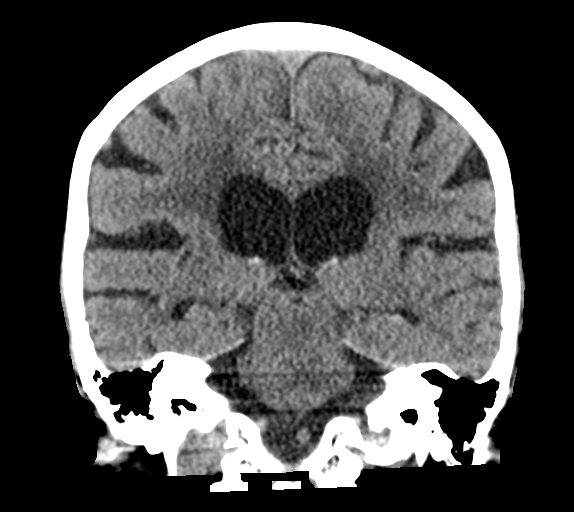

[Series 5: sagittal soft tissue · sagittal · 0.34mm/px · 1 of 52 slices shown]
[im 26/52  brain]
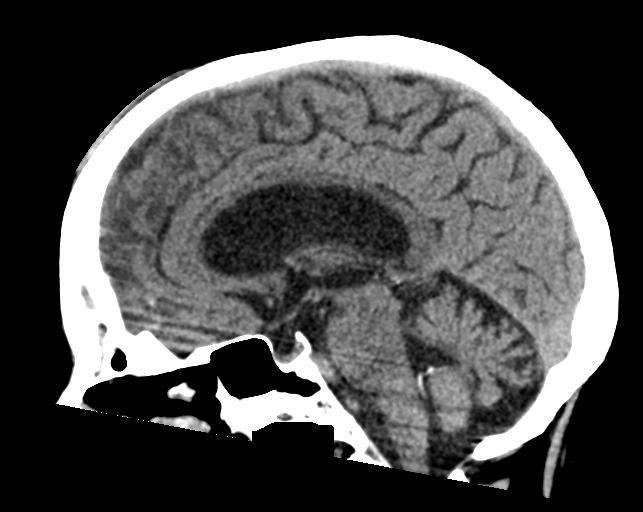

[Series 7: c spine soft · axial · 0.29mm/px · z∈[-284,-268]mm · 2 of 92 slices shown]
[im 9/92  brain]
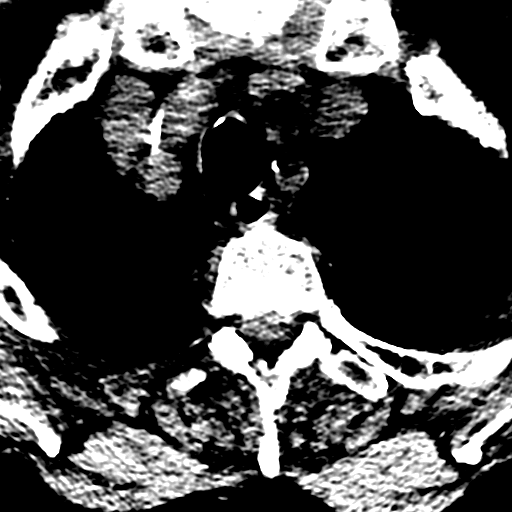
[im 17/92  brain]
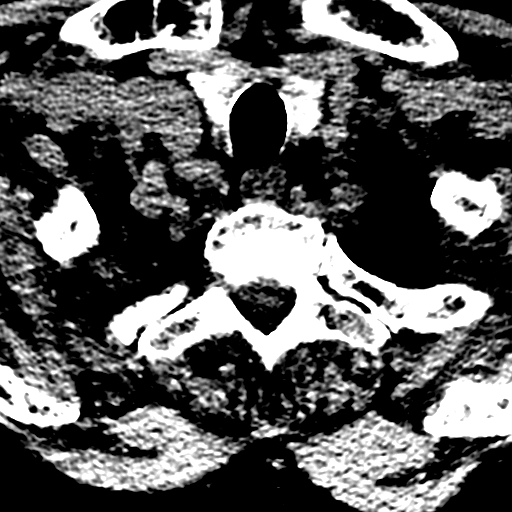

[Series 12: orthogonal bone · axial · 0.21mm/px · z∈[-320,-161]mm · 8 of 104 slices shown]
[im 8/104  bone]
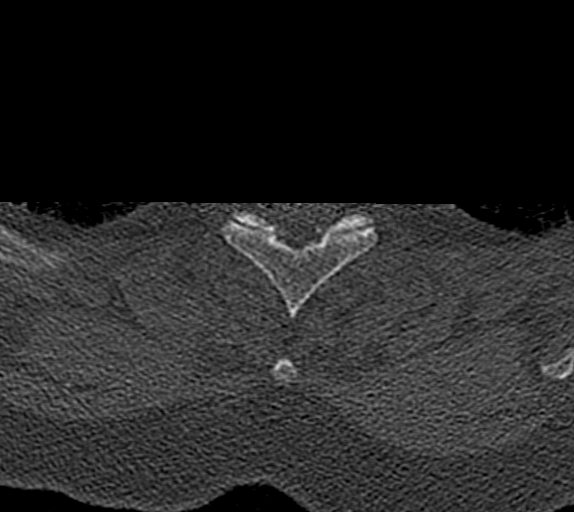
[im 24/104  bone]
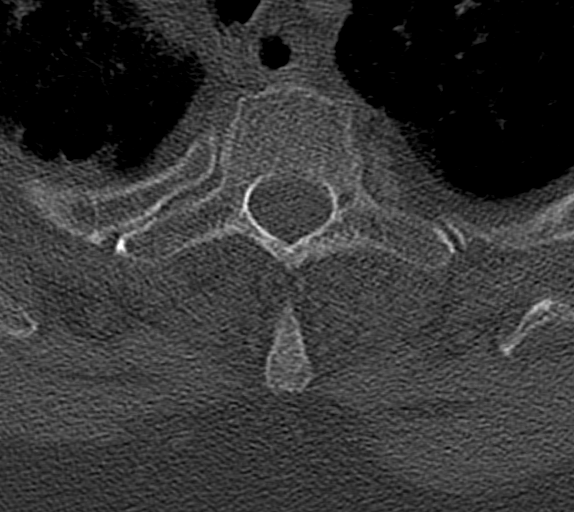
[im 32/104  bone]
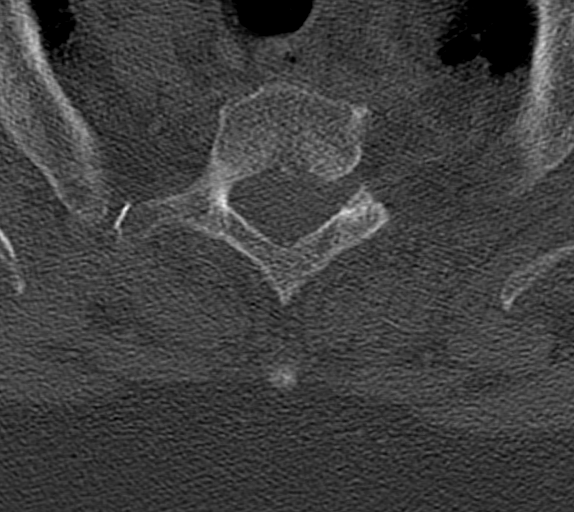
[im 48/104  bone]
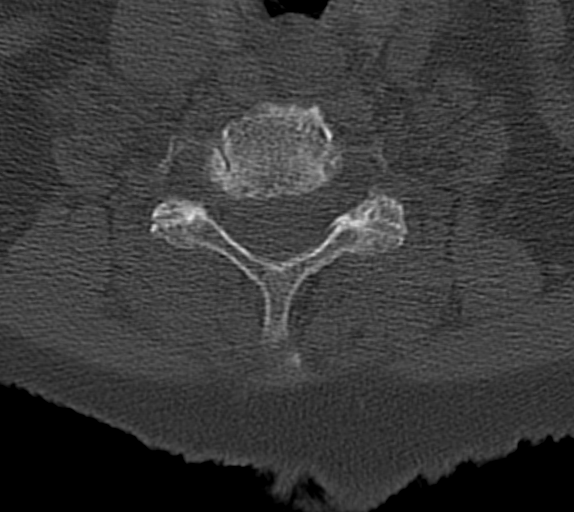
[im 56/104  bone]
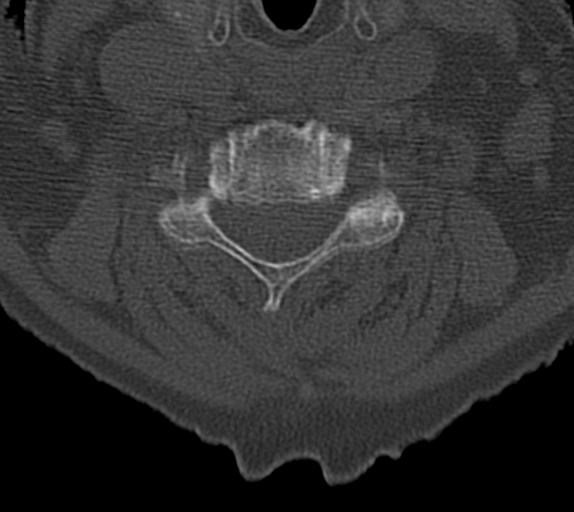
[im 72/104  bone]
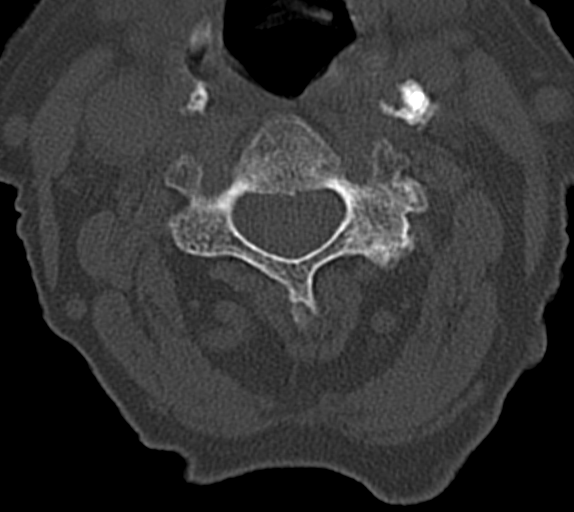
[im 80/104  bone]
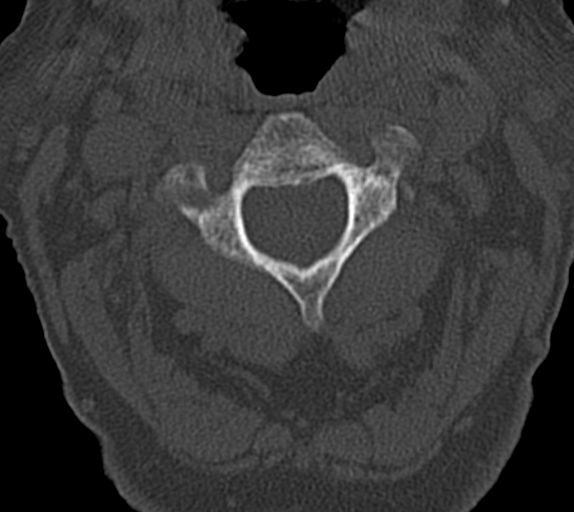
[im 96/104  bone]
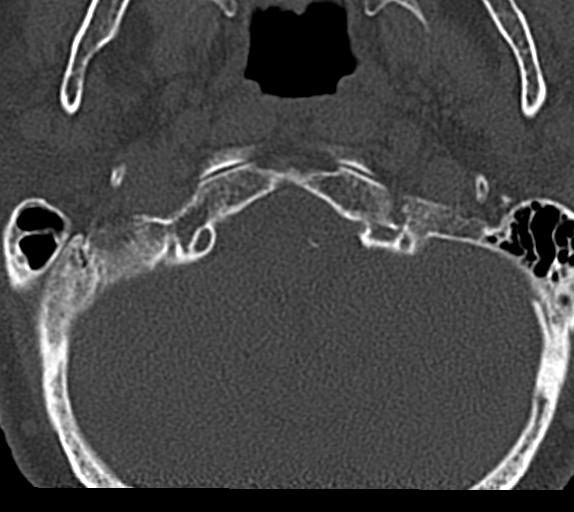

[16 of 47 positions shown; findings below may reference images not displayed]

FINDINGS: CT HEAD FINDINGS

Brain: Generalized atrophy. Advanced chronic small-vessel ischemic
changes of the cerebral hemispheric white matter. No evidence of
acute infarction, mass lesion, intracranial hemorrhage,
hydrocephalus or extra-axial collection. Ventricular size is in
proportion to the degree of generalized atrophy.

Vascular: There is atherosclerotic calcification of the major
vessels at the base of the brain.

Skull: No skull fracture.

Sinuses/Orbits: Chronic sinus inflammatory disease with
opacification of both maxillary sinuses, more many ethmoid air cells
in the left frontal sinus. Sinuses show some high density which can
indicate fungal sinusitis.

Other: Right frontal scalp hematoma.

CT CERVICAL SPINE FINDINGS

Alignment: Straightening of the normal cervical lordosis.

Skull base and vertebrae: No fracture or primary bone lesion.

Soft tissues and spinal canal: Normal

Disc levels: Ordinary degenerative spondylosis C3-4 through C6-7.
Facet osteoarthritis on the left at C2-3 and C3-4 and to a lesser
extent at C7-T1 bilaterally.

Upper chest: Negative

Other: None
IMPRESSION: Head CT: Atrophy and chronic small-vessel ischemic changes. No acute
intracranial finding. No skull fracture. Right frontal scalp
hematoma.

Cervical spine CT: No acute or traumatic finding. Ordinary
degenerative spondylosis and facet osteoarthritis.

## 2020-08-12 IMAGING — CR DG HIP (WITH OR WITHOUT PELVIS) 2-3V*R*
1 series · 3 of 3 positions shown · non-contrast
Comparison: 05/13/2012

CLINICAL DATA: Fall today. Right hip pain.

EXAM:
DG HIP (WITH OR WITHOUT PELVIS) 2-3V RIGHT

[Series 1: dg hip unilat w or w/o pelvis 2-3 views  · non-contrast · 0.14mm/px · 3 of 3 slices shown]
[im 1/3]
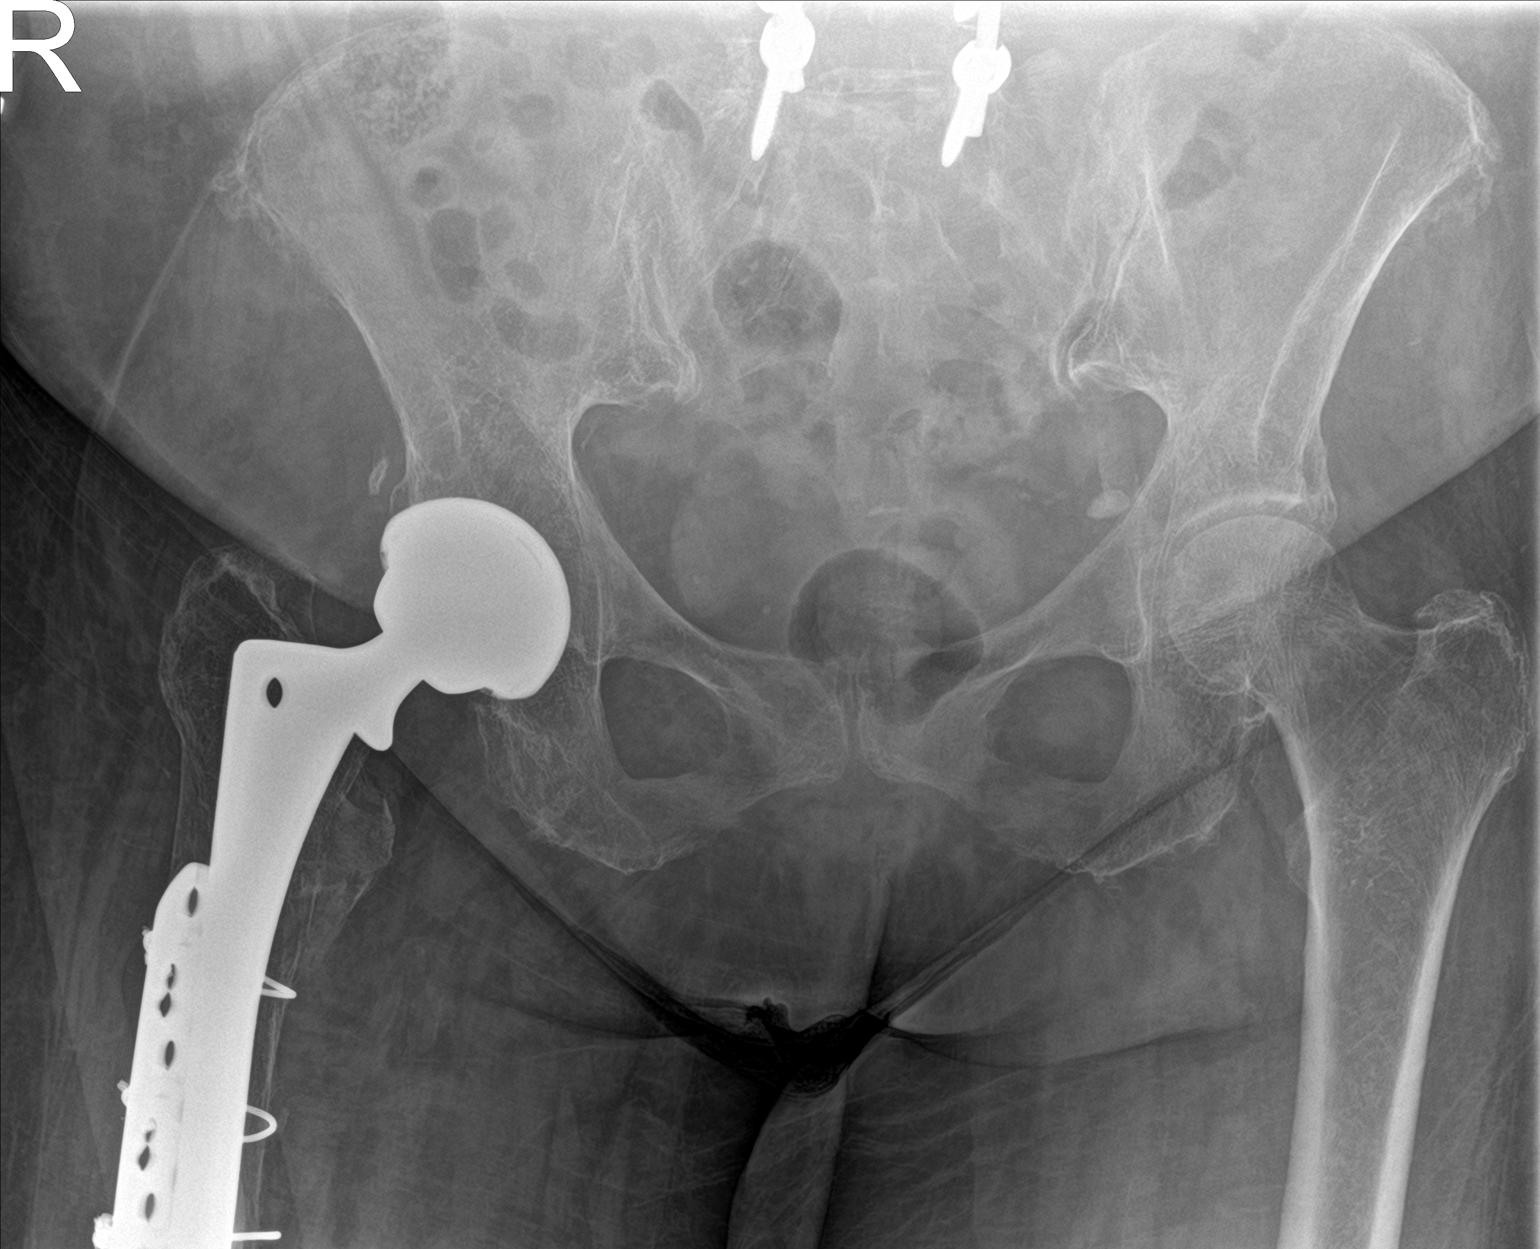
[im 2/3]
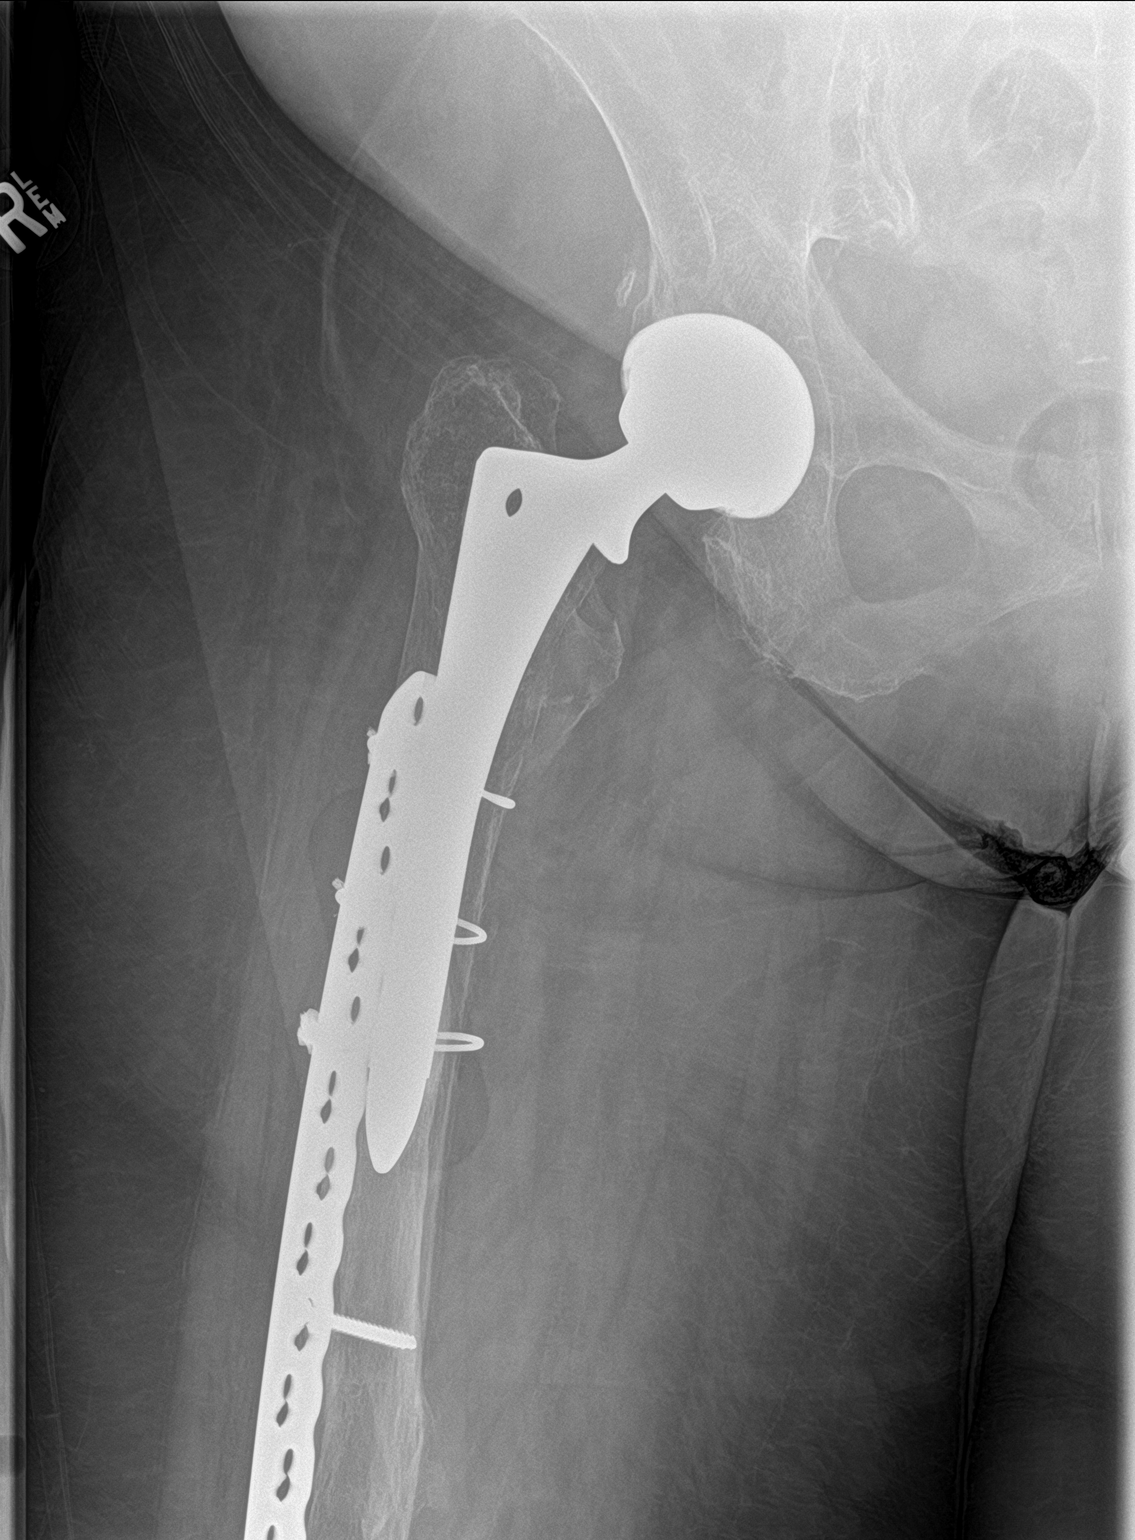
[im 3/3]
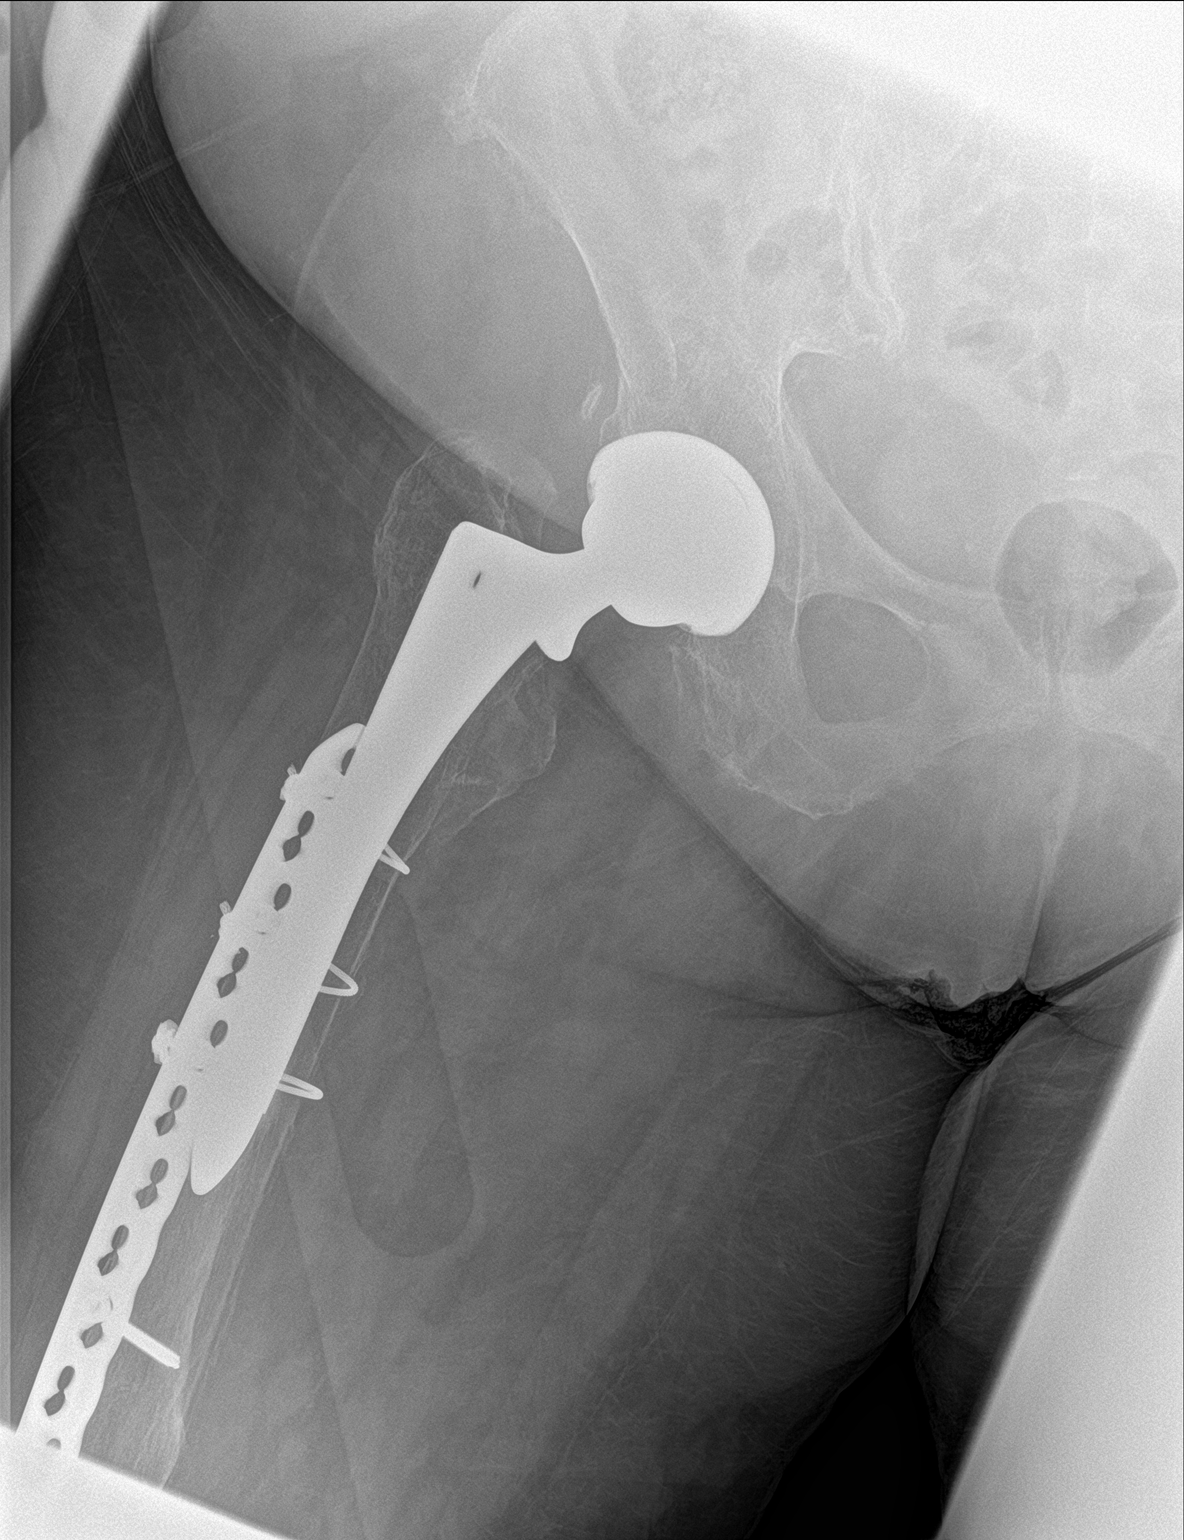

[3 of 3 positions shown; findings below may reference images not displayed]

FINDINGS: Right hip arthroplasty with femoral fixation with cerclage wires.
Incompletely imaged lower lumbar spine fixation. Osteopenia.
Sacroiliac joints are symmetric. Left femoral head located. No acute
fracture. No acute hardware complication.
IMPRESSION: 1. No acute osseous abnormality.
2. Right hip arthroplasty.
